# Patient Record
Sex: Female | Born: 1935
Health system: Southern US, Community
[De-identification: ages and names within clinical notes are randomized; demographics above are authoritative.]

## PROBLEM LIST (undated history)

## (undated) ENCOUNTER — Ambulatory Visit (HOSPITAL_COMMUNITY): Admission: EM | Payer: Medicare Other | Source: Home / Self Care

## (undated) DIAGNOSIS — I10 Essential (primary) hypertension: Secondary | ICD-10-CM

## (undated) DIAGNOSIS — Z973 Presence of spectacles and contact lenses: Secondary | ICD-10-CM

## (undated) DIAGNOSIS — Z972 Presence of dental prosthetic device (complete) (partial): Secondary | ICD-10-CM

## (undated) DIAGNOSIS — N183 Chronic kidney disease, stage 3 unspecified: Secondary | ICD-10-CM

## (undated) DIAGNOSIS — Z974 Presence of external hearing-aid: Secondary | ICD-10-CM

## (undated) DIAGNOSIS — D649 Anemia, unspecified: Secondary | ICD-10-CM

## (undated) DIAGNOSIS — E119 Type 2 diabetes mellitus without complications: Secondary | ICD-10-CM

## (undated) DIAGNOSIS — J45909 Unspecified asthma, uncomplicated: Secondary | ICD-10-CM

## (undated) DIAGNOSIS — I5032 Chronic diastolic (congestive) heart failure: Secondary | ICD-10-CM

## (undated) DIAGNOSIS — I350 Nonrheumatic aortic (valve) stenosis: Secondary | ICD-10-CM

## (undated) DIAGNOSIS — G709 Myoneural disorder, unspecified: Secondary | ICD-10-CM

## (undated) DIAGNOSIS — E538 Deficiency of other specified B group vitamins: Secondary | ICD-10-CM

## (undated) DIAGNOSIS — I480 Paroxysmal atrial fibrillation: Secondary | ICD-10-CM

## (undated) DIAGNOSIS — M1990 Unspecified osteoarthritis, unspecified site: Secondary | ICD-10-CM

## (undated) DIAGNOSIS — M75101 Unspecified rotator cuff tear or rupture of right shoulder, not specified as traumatic: Secondary | ICD-10-CM

## (undated) HISTORY — DX: Nonrheumatic aortic (valve) stenosis: I35.0

## (undated) HISTORY — DX: Paroxysmal atrial fibrillation: I48.0

## (undated) HISTORY — PX: TONSILLECTOMY: SUR1361

## (undated) HISTORY — DX: Anemia, unspecified: D64.9

## (undated) HISTORY — DX: Type 2 diabetes mellitus without complications: E11.9

## (undated) HISTORY — DX: Chronic diastolic (congestive) heart failure: I50.32

## (undated) HISTORY — DX: Deficiency of other specified B group vitamins: E53.8

## (undated) HISTORY — DX: Chronic kidney disease, stage 3 unspecified: N18.30

## (undated) HISTORY — DX: Chronic kidney disease, stage 3 (moderate): N18.3

## (undated) HISTORY — DX: Unspecified asthma, uncomplicated: J45.909

## (undated) HISTORY — DX: Unspecified rotator cuff tear or rupture of right shoulder, not specified as traumatic: M75.101

---
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
## 1966-10-21 HISTORY — PX: VESICOVAGINAL FISTULA CLOSURE W/ TAH: SUR271

## 1981-10-21 HISTORY — PX: OTHER SURGICAL HISTORY: SHX169

## 1987-10-22 HISTORY — PX: TYMPANOSTOMY TUBE PLACEMENT: SHX32

## 1992-10-21 HISTORY — PX: FOOT NEUROMA SURGERY: SHX646

## 1996-10-21 HISTORY — PX: OTHER SURGICAL HISTORY: SHX169

## 1999-08-01 ENCOUNTER — Ambulatory Visit (HOSPITAL_COMMUNITY): Admission: RE | Admit: 1999-08-01 | Discharge: 1999-08-01 | Payer: Self-pay | Admitting: Endocrinology

## 1999-10-22 HISTORY — PX: OTHER SURGICAL HISTORY: SHX169

## 1999-11-21 ENCOUNTER — Encounter: Payer: Self-pay | Admitting: Endocrinology

## 1999-11-21 ENCOUNTER — Encounter: Admission: RE | Admit: 1999-11-21 | Discharge: 1999-11-21 | Payer: Self-pay | Admitting: Endocrinology

## 2000-02-12 ENCOUNTER — Encounter: Admission: RE | Admit: 2000-02-12 | Discharge: 2000-02-12 | Payer: Self-pay | Admitting: Gastroenterology

## 2000-02-12 ENCOUNTER — Encounter: Payer: Self-pay | Admitting: Gastroenterology

## 2000-02-19 ENCOUNTER — Ambulatory Visit (HOSPITAL_COMMUNITY): Admission: RE | Admit: 2000-02-19 | Discharge: 2000-02-19 | Payer: Self-pay | Admitting: Gastroenterology

## 2000-03-12 ENCOUNTER — Ambulatory Visit (HOSPITAL_COMMUNITY): Admission: RE | Admit: 2000-03-12 | Discharge: 2000-03-12 | Payer: Self-pay | Admitting: Gastroenterology

## 2000-10-21 HISTORY — PX: OTHER SURGICAL HISTORY: SHX169

## 2001-01-06 ENCOUNTER — Encounter: Payer: Self-pay | Admitting: Gastroenterology

## 2001-01-06 ENCOUNTER — Ambulatory Visit (HOSPITAL_COMMUNITY): Admission: RE | Admit: 2001-01-06 | Discharge: 2001-01-06 | Payer: Self-pay | Admitting: Gastroenterology

## 2001-01-21 ENCOUNTER — Ambulatory Visit (HOSPITAL_COMMUNITY): Admission: RE | Admit: 2001-01-21 | Discharge: 2001-01-21 | Payer: Self-pay | Admitting: Gastroenterology

## 2001-01-21 ENCOUNTER — Encounter: Payer: Self-pay | Admitting: Gastroenterology

## 2001-05-28 ENCOUNTER — Encounter: Admission: RE | Admit: 2001-05-28 | Discharge: 2001-06-29 | Payer: Self-pay | Admitting: Orthopaedic Surgery

## 2003-04-14 ENCOUNTER — Encounter: Payer: Self-pay | Admitting: Orthopaedic Surgery

## 2003-04-19 ENCOUNTER — Inpatient Hospital Stay (HOSPITAL_COMMUNITY): Admission: RE | Admit: 2003-04-19 | Discharge: 2003-04-21 | Payer: Self-pay | Admitting: Orthopaedic Surgery

## 2003-04-21 ENCOUNTER — Encounter: Payer: Self-pay | Admitting: Physical Medicine & Rehabilitation

## 2003-04-21 ENCOUNTER — Inpatient Hospital Stay (HOSPITAL_COMMUNITY)
Admission: RE | Admit: 2003-04-21 | Discharge: 2003-04-28 | Payer: Self-pay | Admitting: Physical Medicine & Rehabilitation

## 2003-06-15 ENCOUNTER — Ambulatory Visit (HOSPITAL_COMMUNITY): Admission: RE | Admit: 2003-06-15 | Discharge: 2003-06-15 | Payer: Self-pay | Admitting: Orthopaedic Surgery

## 2003-10-22 HISTORY — PX: OTHER SURGICAL HISTORY: SHX169

## 2004-08-10 ENCOUNTER — Inpatient Hospital Stay (HOSPITAL_COMMUNITY): Admission: RE | Admit: 2004-08-10 | Discharge: 2004-08-13 | Payer: Self-pay | Admitting: Neurosurgery

## 2004-10-21 HISTORY — PX: OTHER SURGICAL HISTORY: SHX169

## 2004-12-18 ENCOUNTER — Encounter: Admission: RE | Admit: 2004-12-18 | Discharge: 2004-12-18 | Payer: Self-pay | Admitting: Neurosurgery

## 2005-01-07 ENCOUNTER — Encounter: Admission: RE | Admit: 2005-01-07 | Discharge: 2005-01-07 | Payer: Self-pay | Admitting: Neurosurgery

## 2005-05-22 ENCOUNTER — Encounter: Admission: RE | Admit: 2005-05-22 | Discharge: 2005-05-22 | Payer: Self-pay | Admitting: Orthopaedic Surgery

## 2005-05-31 ENCOUNTER — Encounter: Admission: RE | Admit: 2005-05-31 | Discharge: 2005-05-31 | Payer: Self-pay | Admitting: Orthopaedic Surgery

## 2006-02-14 ENCOUNTER — Emergency Department (HOSPITAL_COMMUNITY): Admission: EM | Admit: 2006-02-14 | Discharge: 2006-02-15 | Payer: Self-pay | Admitting: Emergency Medicine

## 2006-10-21 HISTORY — PX: CATARACT EXTRACTION, BILATERAL: SHX1313

## 2009-10-21 HISTORY — PX: OTHER SURGICAL HISTORY: SHX169

## 2010-11-11 ENCOUNTER — Encounter: Payer: Self-pay | Admitting: Neurosurgery

## 2010-11-11 ENCOUNTER — Encounter: Payer: Self-pay | Admitting: Neurology

## 2011-03-19 ENCOUNTER — Ambulatory Visit
Admission: RE | Admit: 2011-03-19 | Discharge: 2011-03-19 | Disposition: A | Discharge status: Home / Self Care | Payer: Medicare Other | Source: Ambulatory Visit | Attending: Family Medicine | Admitting: Family Medicine

## 2011-03-19 ENCOUNTER — Other Ambulatory Visit: Payer: Self-pay | Admitting: Family Medicine

## 2011-03-19 DIAGNOSIS — R05 Cough: Secondary | ICD-10-CM

## 2011-03-19 DIAGNOSIS — R058 Other specified cough: Secondary | ICD-10-CM

## 2011-07-10 ENCOUNTER — Encounter: Payer: Self-pay | Admitting: Emergency Medicine

## 2011-07-11 ENCOUNTER — Encounter: Payer: Self-pay | Admitting: Emergency Medicine

## 2011-07-11 ENCOUNTER — Ambulatory Visit (INDEPENDENT_AMBULATORY_CARE_PROVIDER_SITE_OTHER): Payer: Medicare Other | Admitting: Emergency Medicine

## 2011-07-11 ENCOUNTER — Ambulatory Visit (INDEPENDENT_AMBULATORY_CARE_PROVIDER_SITE_OTHER)
Admission: RE | Admit: 2011-07-11 | Discharge: 2011-07-11 | Disposition: A | Discharge status: Home / Self Care | Payer: Medicare Other | Source: Ambulatory Visit | Attending: Emergency Medicine | Admitting: Emergency Medicine

## 2011-07-11 DIAGNOSIS — E119 Type 2 diabetes mellitus without complications: Secondary | ICD-10-CM

## 2011-07-11 DIAGNOSIS — J45909 Unspecified asthma, uncomplicated: Secondary | ICD-10-CM | POA: Insufficient documentation

## 2011-07-11 DIAGNOSIS — R05 Cough: Secondary | ICD-10-CM

## 2011-07-11 DIAGNOSIS — R059 Cough, unspecified: Secondary | ICD-10-CM | POA: Insufficient documentation

## 2011-07-11 DIAGNOSIS — M1990 Unspecified osteoarthritis, unspecified site: Secondary | ICD-10-CM | POA: Insufficient documentation

## 2011-07-11 DIAGNOSIS — E559 Vitamin D deficiency, unspecified: Secondary | ICD-10-CM | POA: Insufficient documentation

## 2011-07-11 MED ORDER — OMEPRAZOLE 20 MG PO CPDR: 20.0000 mg | DELAYED_RELEASE_CAPSULE | Freq: Every day | ORAL | Status: DC

## 2011-07-11 MED ORDER — LORATADINE 10 MG PO TABS: 10.0000 mg | ORAL_TABLET | Freq: Every day | ORAL | Status: DC

## 2011-07-11 NOTE — Assessment & Plan Note (Signed)
-   stop Advair - SABA prn

## 2011-07-11 NOTE — Assessment & Plan Note (Signed)
I believe that cough is primarily UA irritation. Her asthma does not appear to be active on exam, she hasn;t responded to typical therapy.  - d/c advair - nasonex - start loiratadine and omeprazole empirically - ? Need to go back to Dr Earlean Shawl in the future  - rov 6 week

## 2011-07-11 NOTE — Patient Instructions (Signed)
Please stop your Advair Start taking your nasonex 2 sprays each side daily Start loratadine 10mg  daily Start omeprazole 20mg  daily We will perform a CXR today We will perform pulmonary function testing at a future visit.  Follow with Dr Lamonte Sakai in 4 -6 weeks to review your status

## 2011-07-11 NOTE — Progress Notes (Signed)
Subjective:    Patient ID: Ann Sellers, female    DOB: 1925-02-01, 77 y.o.   MRN: [id number]  HPI 77 yo former smoker (remote, 10pk-yrs), hx asthma dx as a child, also DM, gout, B12 def. Has esophageal strictures s/p dilation by Dr Earlean Shawl. Referred by Dr Chapman Fitch.    Tells me that she was on inhaled meds as a child, grew out of the sx, and then had recurrence non-productive cough, chest heaviness as an adult. This has been problematic off/on, but more consistent and bothersome in the last few yrs - chest tight and dry cough. In May had URI that increased the sx and caused colored mucous. Has been rx with several rounds steroids and abx since. She is left with severe persistent dry cough. She describes some dysphagia (has had dilation before) without overt aspiration sx. Some intermittent GERD, not on PPI. She uses nasonex prn, has some nasal gtt. She was started on Advair HFA 3 weeks ago, her cough is worse after she takes, has trouble w cough after.    Review of Systems  Constitutional: Negative.  Negative for fever and unexpected weight change.  HENT: Positive for trouble swallowing. Negative for ear pain, nosebleeds, congestion, sore throat, rhinorrhea, sneezing, dental problem, postnasal drip and sinus pressure.   Eyes: Negative.  Negative for redness and itching.  Respiratory: Positive for cough and shortness of breath. Negative for chest tightness and wheezing.   Cardiovascular: Negative.  Negative for palpitations and leg swelling.  Gastrointestinal: Negative.  Negative for nausea and vomiting.  Genitourinary: Negative.  Negative for dysuria.  Musculoskeletal: Positive for joint swelling.  Skin: Negative.  Negative for rash.  Neurological: Negative.  Negative for headaches.  Hematological: Negative.  Does not bruise/bleed easily.  Psychiatric/Behavioral: Negative.  Negative for dysphoric mood. The patient is not nervous/anxious.     Past Medical History  Diagnosis Date  . B12  deficiency   . Asthma   . Diabetes mellitus      Family History  Problem Relation Age of Onset  . Pneumonia Mother   . Heart disease Sister   . Diabetes Sister   . Hypertension Sister      History   Social History  . Marital Status: Widowed    Spouse Name: N/A    Number of Children: 6  . Years of Education: N/A   Occupational History  . homemaker    Social History Main Topics  . Smoking status: Former Smoker -- 0.5 packs/day for 20 years    Types: Cigarettes    Quit date: 10/21/1978  . Smokeless tobacco: Not on file  . Alcohol Use: Yes     social drinker  . Drug Use: No  . Sexually Active: Not on file   Other Topics Concern  . Not on file   Social History Narrative  . No narrative on file     Allergies  Allergen Reactions  . Snake Antivenin (Antivenin Crotalidae Polyvalent (Snake Antivenin))      Outpatient Prescriptions Prior to Visit  Medication Sig Dispense Refill  . albuterol (PROVENTIL HFA;VENTOLIN HFA) 108 (90 BASE) MCG/ACT inhaler Inhale 2 puffs into the lungs every 6 (six) hours as needed.        Marland Kitchen allopurinol (ZYLOPRIM) 100 MG tablet Take 100 mg by mouth daily.        Marland Kitchen aspirin 81 MG tablet Take 81 mg by mouth daily.        . Calcium Carbonate-Vitamin D (CALCIUM PLUS VITAMIN D PO)  Take 1 tablet by mouth daily.        . colchicine 0.6 MG tablet Take 0.6 mg by mouth daily as needed.       . fluticasone-salmeterol (ADVAIR HFA) 115-21 MCG/ACT inhaler Inhale 2 puffs into the lungs 2 (two) times daily.        . insulin glargine (LANTUS) 100 UNIT/ML injection Inject 20 Units into the skin at bedtime.        . meloxicam (MOBIC) 7.5 MG tablet Take 7.5 mg by mouth daily.        . mometasone (NASONEX) 50 MCG/ACT nasal spray Place 1 spray into the nose daily.        . Multiple Vitamins-Minerals (WOMENS DAILY FORMULA PO) Take 1 tablet by mouth daily.        . sitaGLIPtin (JANUVIA) 100 MG tablet Take 100 mg by mouth daily.        . vitamin B-12 (CYANOCOBALAMIN)  1000 MCG tablet Take 1,000 mcg by mouth daily.        Marland Kitchen albuterol (PROVENTIL) (2.5 MG/3ML) 0.083% nebulizer solution Take 2.5 mg by nebulization 3 (three) times daily as needed.              Objective:   Physical Exam  Gen: Pleasant, overwt elderly, in no distress,  normal affect  ENT: dentures, no erythema  Neck: No JVD, no TMG, no carotid bruits  Lungs: No wheezing, some referred UA noise w cough  Cardiovascular: RRR, heart sounds normal, no murmur or gallops, no peripheral edema  Musculoskeletal: No deformities, no cyanosis or clubbing  Neuro: alert, non focal  Skin: Warm, no lesions or rashes     Assessment & Plan:  Cough I believe that cough is primarily UA irritation. Her asthma does not appear to be active on exam, she hasn;t responded to typical therapy.  - d/c advair - nasonex - start loiratadine and omeprazole empirically - ? Need to go back to Dr Earlean Shawl in the future  - rov 6 week  Asthma - stop Advair - SABA prn

## 2011-08-13 ENCOUNTER — Encounter: Payer: Self-pay | Admitting: Adult Health

## 2011-08-13 ENCOUNTER — Ambulatory Visit (INDEPENDENT_AMBULATORY_CARE_PROVIDER_SITE_OTHER): Payer: Medicare Other | Admitting: Adult Health

## 2011-08-13 VITALS — BP 142/60 | HR 93 | Temp 97.1°F | Ht 61.0 in | Wt 200.0 lb

## 2011-08-13 DIAGNOSIS — R05 Cough: Secondary | ICD-10-CM

## 2011-08-13 DIAGNOSIS — R059 Cough, unspecified: Secondary | ICD-10-CM

## 2011-08-13 MED ORDER — HYDROCODONE-HOMATROPINE 5-1.5 MG/5ML PO SYRP: 5.0000 mL | ORAL_SOLUTION | Freq: Four times a day (QID) | ORAL | Status: AC | PRN

## 2011-08-13 MED ORDER — PREDNISONE 10 MG PO TABS: ORAL_TABLET | ORAL | Status: DC

## 2011-08-13 NOTE — Patient Instructions (Addendum)
Add Delsym 2 tsp Twice daily   Add Chlortrimeton 4mg  2 At bedtime   Prilosec 20mg  daily before meal Add Pepcid 20mg  At bedtime   Use sugarless candy, water to prevent cough and throat clearing.  AVOID ALL MINTS Hydromet 1 tsp every 4-6 hr As needed  Cough ,may make you sleepy.  Prednisone taper over next week.  follow up DR. Byrum in 1 week as planned and As needed

## 2011-08-15 NOTE — Assessment & Plan Note (Addendum)
Upper airway cough syndrome w/ neg cxr on 07/11/11 tx for possible triggers of rhinitis and gerd along w/ cough control   Plan:   Add Delsym 2 tsp Twice daily   Add Chlortrimeton 4mg  2 At bedtime   Prilosec 20mg  daily before meal Add Pepcid 20mg  At bedtime   Use sugarless candy, water to prevent cough and throat clearing.  AVOID ALL MINTS Hydromet 1 tsp every 4-6 hr As needed  Cough ,may make you sleepy.  Prednisone taper over next week.

## 2011-08-15 NOTE — Progress Notes (Signed)
  Subjective:    Patient ID: Ann Sellers, female    DOB: [birth year], 77 y.o.   MRN: [id number]  HPI 77 yo former smoker (remote, 10pk-yrs), hx asthma dx as a child, also DM, gout, B12 def. Has esophageal strictures s/p dilation by Dr Earlean Shawl. Referred by Dr Chapman Fitch.   07/11/11 Initial pulmonary consult  Tells me that she was on inhaled meds as a child, grew out of the sx, and then had recurrence non-productive cough, chest heaviness as an adult. This has been problematic off/on, but more consistent and bothersome in the last few yrs - chest tight and dry cough. In May had URI that increased the sx and caused colored mucous. Has been rx with several rounds steroids and abx since. She is left with severe persistent dry cough. She describes some dysphagia (has had dilation before) without overt aspiration sx. Some intermittent GERD, not on PPI. She uses nasonex prn, has some nasal gtt. She was started on Advair HFA 3 weeks ago, her cough is worse after she takes, has trouble w cough after.  >>d/c advair   08/13/2011 Acute OV  Returns for persistent cough , does not feel cough is getting better. Cough is dry in nature.  Cough worse - Mostly dry - SOB worse on exertion over past 4-6 weeks. Tx for upper airway cough syndome last ov with claritin, nasonex and prilosec. CXR did not show any acute process on 07/11/11.  She was taken off advair. No discolored mucus or fever. Does have drainage and tickle in throat.       Review of Systems Constitutional:   No  weight loss, night sweats,  Fevers, chills, fatigue, or  lassitude.  HEENT:   No headaches,  Difficulty swallowing,  Tooth/dental problems, or  Sore throat,                No sneezing, itching, ear ache, nasal congestion,  +post nasal drip,   CV:  No chest pain,  Orthopnea, PND, swelling in lower extremities, anasarca, dizziness, palpitations, syncope.   GI  No heartburn, indigestion, abdominal pain, nausea, vomiting, diarrhea, change in bowel  habits, loss of appetite, bloody stools.    Lungs:   No chest wall deformity  Skin: no rash or lesions.  GU: no dysuria, change in color of urine, no urgency or frequency.  No flank pain, no hematuria   MS:  No joint pain or swelling.  No decreased range of motion.     Psych:  No change in mood or affect. No depression or anxiety.           Objective:   Physical Exam GEN: A/Ox3; pleasant , NAD  HEENT:  [place]/AT,  EACs-clear, TMs-wnl, NOSE-clear, THROAT-clear, no lesions, no postnasal drip or exudate noted.   NECK:  Supple w/ fair ROM; no JVD; normal carotid impulses w/o bruits; no thyromegaly or nodules palpated; no lymphadenopathy.  RESP  Coarse BS w/o, wheezes/ rales/ or rhonchi.no accessory muscle use, no dullness to percussion  CARD:  RRR, no m/r/g  , no peripheral edema, pulses intact, no cyanosis or clubbing.  GI:   Soft & nt; nml bowel sounds; no organomegaly or masses detected.  Musco: Warm bil, no deformities or joint swelling noted.   Neuro: alert, no focal deficits noted.    Skin: Warm, no lesions or rashes         Assessment & Plan:

## 2011-08-20 ENCOUNTER — Ambulatory Visit (INDEPENDENT_AMBULATORY_CARE_PROVIDER_SITE_OTHER): Payer: Medicare Other | Admitting: Emergency Medicine

## 2011-08-20 ENCOUNTER — Encounter: Payer: Self-pay | Admitting: Emergency Medicine

## 2011-08-20 DIAGNOSIS — J45909 Unspecified asthma, uncomplicated: Secondary | ICD-10-CM

## 2011-08-20 DIAGNOSIS — R059 Cough, unspecified: Secondary | ICD-10-CM

## 2011-08-20 DIAGNOSIS — R05 Cough: Secondary | ICD-10-CM

## 2011-08-20 NOTE — Progress Notes (Signed)
  Subjective:    Patient ID: Ann Sellers, female    DOB: 01-28-1925, 77 y.o.   MRN: [id number]  HPI 77 yo former smoker (remote, 10pk-yrs), hx asthma dx as a child, also DM, gout, B12 def. Has esophageal strictures s/p dilation by Dr Earlean Shawl. Referred by Dr Chapman Fitch.   07/11/11 Initial pulmonary consult  Tells me that she was on inhaled meds as a child, grew out of the sx, and then had recurrence non-productive cough, chest heaviness as an adult. This has been problematic off/on, but more consistent and bothersome in the last few yrs - chest tight and dry cough. In May had URI that increased the sx and caused colored mucous. Has been rx with several rounds steroids and abx since. She is left with severe persistent dry cough. She describes some dysphagia (has had dilation before) without overt aspiration sx. Some intermittent GERD, not on PPI. She uses nasonex prn, has some nasal gtt. She was started on Advair HFA 3 weeks ago, her cough is worse after she takes, has trouble w cough after.  >>d/c advair   08/13/2011 Acute OV  Returns for persistent cough , does not feel cough is getting better. Cough is dry in nature.  Cough worse - Mostly dry - SOB worse on exertion over past 4-6 weeks. Tx for upper airway cough syndome last ov with claritin, nasonex and prilosec. CXR did not show any acute process on 07/11/11.  She was taken off advair. No discolored mucus or fever. Does have drainage and tickle in throat.   ROV 08/20/11 - UA irritation, cough. She saw TP last week and was treated with pred taper; also given delsym, chlorphenerimine. Also added pepcid to prilosec. Also taking delsym prn. He cough is better. Hasn't missed the Advair w regard to breathing.      Objective:   Physical Exam GEN: A/Ox3; pleasant , NAD  HEENT:  [place]/AT,  EACs-clear, TMs-wnl, NOSE-clear, THROAT-clear, no lesions, no postnasal drip or exudate noted.   NECK:  Supple w/ fair ROM; no JVD; normal carotid impulses w/o bruits; no  thyromegaly or nodules palpated; no lymphadenopathy.  RESP  Coarse BS w/o, wheezes/ rales/ or rhonchi.no accessory muscle use, no dullness to percussion  CARD:  RRR, no m/r/g  , no peripheral edema, pulses intact, no cyanosis or clubbing.  GI:   Soft & nt; nml bowel sounds; no organomegaly or masses detected.  Musco: Warm bil, no deformities or joint swelling noted.   Neuro: alert, no focal deficits noted.    Skin: Warm, no lesions or rashes     Assessment & Plan:  Asthma No scheduled meds at this time  Cough Do not restart Advair Try stopping delsym. You may still need to use occasionally for cough.  Continue the Chlortrimaton  Continue the nasonex daily Continue the prilosec and pepcid.  Follow up with Dr Lamonte Sakai in 1 month.

## 2011-08-20 NOTE — Assessment & Plan Note (Signed)
Do not restart Advair Try stopping delsym. You may still need to use occasionally for cough.  Continue the Chlortrimaton  Continue the nasonex daily Continue the prilosec and pepcid.  Follow up with Dr Lamonte Sakai in 1 month.

## 2011-08-20 NOTE — Assessment & Plan Note (Signed)
No scheduled meds at this time

## 2011-08-20 NOTE — Patient Instructions (Signed)
Do not restart Advair Try stopping delsym. You may still need to use occasionally for cough.  Continue the Chlortrimaton  Continue the nasonex daily Continue the prilosec and pepcid.  Follow up with Dr Lamonte Sakai in 1 month.

## 2011-09-23 ENCOUNTER — Encounter: Payer: Self-pay | Admitting: Emergency Medicine

## 2011-09-23 ENCOUNTER — Ambulatory Visit (INDEPENDENT_AMBULATORY_CARE_PROVIDER_SITE_OTHER): Payer: Medicare Other | Admitting: Emergency Medicine

## 2011-09-23 DIAGNOSIS — R059 Cough, unspecified: Secondary | ICD-10-CM

## 2011-09-23 DIAGNOSIS — J45909 Unspecified asthma, uncomplicated: Secondary | ICD-10-CM

## 2011-09-23 DIAGNOSIS — R05 Cough: Secondary | ICD-10-CM

## 2011-09-23 MED ORDER — LORATADINE 10 MG PO TABS: 10.0000 mg | ORAL_TABLET | Freq: Every day | ORAL | Status: DC

## 2011-09-23 NOTE — Patient Instructions (Signed)
Please go back on your Advair 2 puffs twice a day Use albuterol as needed Continue your prilosec and pepcid every day Use chlortrimaton at night Start loratadine 10mg  daily (Claritin) Use Nasonex 2 sprays each side every day Follow with Dr Lamonte Sakai in 3 months or sooner if you have any problems.

## 2011-09-23 NOTE — Assessment & Plan Note (Addendum)
-   albuterol prn - consider restart Advair vs symbicort

## 2011-09-23 NOTE — Progress Notes (Signed)
  Subjective:    Patient ID: Ann Sellers, female    DOB: 08-16-1925, 77 y.o.   MRN: [id number] HPI 77 yo former smoker (remote, 10pk-yrs), hx asthma dx as a child, also DM, gout, B12 def. Has esophageal strictures s/p dilation by Dr Earlean Shawl. Referred by Dr Chapman Fitch.   07/11/11 Initial pulmonary consult  Tells me that she was on inhaled meds as a child, grew out of the sx, and then had recurrence non-productive cough, chest heaviness as an adult. This has been problematic off/on, but more consistent and bothersome in the last few yrs - chest tight and dry cough. In May had URI that increased the sx and caused colored mucous. Has been rx with several rounds steroids and abx since. She is left with severe persistent dry cough. She describes some dysphagia (has had dilation before) without overt aspiration sx. Some intermittent GERD, not on PPI. She uses nasonex prn, has some nasal gtt. She was started on Advair HFA 3 weeks ago, her cough is worse after she takes, has trouble w cough after.  >>d/c advair   08/13/2011 Acute OV  Returns for persistent cough , does not feel cough is getting better. Cough is dry in nature.  Cough worse - Mostly dry - SOB worse on exertion over past 4-6 weeks. Tx for upper airway cough syndome last ov with claritin, nasonex and prilosec. CXR did not show any acute process on 07/11/11.  She was taken off advair. No discolored mucus or fever. Does have drainage and tickle in throat.   ROV 08/20/11 - UA irritation, cough. She saw TP last week and was treated with pred taper; also given delsym, chlorphenerimine. Also added pepcid to prilosec. Also taking delsym prn. He cough is better. Hasn't missed the Advair w regard to breathing.   ROV 09/23/11 -- UA irritation, cough. Hx childhood asthma. She has cut down on delsym use, remains on her prilosec + pepcid, chlortrimaton at night. No longer on Advair. She continues to have dry cough. She has exertional dyspnea with walking and  housework.      Objective:   Physical Exam GEN: A/Ox3; pleasant , NAD  HEENT:  [place]/AT,  EACs-clear, TMs-wnl, NOSE-clear, THROAT-clear, no lesions, no postnasal drip or exudate noted.   NECK:  Supple w/ fair ROM; no JVD; normal carotid impulses w/o bruits; no thyromegaly or nodules palpated; no lymphadenopathy.  RESP  Coarse BS w/o, wheezes/ rales/ or rhonchi.no accessory muscle use, no dullness to percussion  CARD:  RRR, no m/r/g  , no peripheral edema, pulses intact, no cyanosis or clubbing.  Musco: Warm bil, no deformities or joint swelling noted.   Neuro: alert, no focal deficits noted.    Skin: Warm, no lesions or rashes     Assessment & Plan:  Asthma - albuterol prn - consider restart Advair vs symbicort  Cough - continue prilosec and pepcid.  - nasonex - chlortrimaton qhs - add loratadine

## 2011-09-23 NOTE — Assessment & Plan Note (Signed)
-   continue prilosec and pepcid.  - nasonex - chlortrimaton qhs - add loratadine

## 2011-10-18 ENCOUNTER — Encounter (HOSPITAL_COMMUNITY): Payer: Self-pay | Admitting: Emergency Medicine

## 2011-10-18 ENCOUNTER — Other Ambulatory Visit: Payer: Self-pay

## 2011-10-18 ENCOUNTER — Emergency Department (HOSPITAL_COMMUNITY)
Admission: EM | Admit: 2011-10-18 | Discharge: 2011-10-18 | Disposition: A | Discharge status: Home / Self Care | Payer: Medicare Other | Attending: Emergency Medicine | Admitting: Emergency Medicine

## 2011-10-18 DIAGNOSIS — E538 Deficiency of other specified B group vitamins: Secondary | ICD-10-CM | POA: Insufficient documentation

## 2011-10-18 DIAGNOSIS — R059 Cough, unspecified: Secondary | ICD-10-CM | POA: Insufficient documentation

## 2011-10-18 DIAGNOSIS — Z79899 Other long term (current) drug therapy: Secondary | ICD-10-CM | POA: Insufficient documentation

## 2011-10-18 DIAGNOSIS — S51809A Unspecified open wound of unspecified forearm, initial encounter: Secondary | ICD-10-CM | POA: Insufficient documentation

## 2011-10-18 DIAGNOSIS — W1809XA Striking against other object with subsequent fall, initial encounter: Secondary | ICD-10-CM | POA: Insufficient documentation

## 2011-10-18 DIAGNOSIS — E119 Type 2 diabetes mellitus without complications: Secondary | ICD-10-CM | POA: Insufficient documentation

## 2011-10-18 DIAGNOSIS — W19XXXA Unspecified fall, initial encounter: Secondary | ICD-10-CM

## 2011-10-18 DIAGNOSIS — S51811A Laceration without foreign body of right forearm, initial encounter: Secondary | ICD-10-CM

## 2011-10-18 DIAGNOSIS — Y92009 Unspecified place in unspecified non-institutional (private) residence as the place of occurrence of the external cause: Secondary | ICD-10-CM | POA: Insufficient documentation

## 2011-10-18 DIAGNOSIS — J45909 Unspecified asthma, uncomplicated: Secondary | ICD-10-CM | POA: Insufficient documentation

## 2011-10-18 DIAGNOSIS — Z794 Long term (current) use of insulin: Secondary | ICD-10-CM | POA: Insufficient documentation

## 2011-10-18 DIAGNOSIS — R05 Cough: Secondary | ICD-10-CM | POA: Insufficient documentation

## 2011-10-18 LAB — GLUCOSE, CAPILLARY: Glucose-Capillary: 92 mg/dL (ref 70–99)

## 2011-10-18 MED ORDER — TETANUS-DIPHTH-ACELL PERTUSSIS 5-2.5-18.5 LF-MCG/0.5 IM SUSP
0.5000 mL | Freq: Once | INTRAMUSCULAR | Status: AC
  Administered 2011-10-18: 0.5 mL via INTRAMUSCULAR
  Filled 2011-10-18: qty 0.5

## 2011-10-18 NOTE — ED Notes (Signed)
CBG 92 mg/dl tested by glucometer in the ED

## 2011-10-18 NOTE — ED Provider Notes (Signed)
History     CSN: [id number]  Arrival date & time 10/18/11  1616   First MD Initiated Contact with Patient 10/18/11 1629      Chief Complaint  Patient presents with  . Fall    (Consider location/radiation/quality/duration/timing/severity/associated sxs/prior treatment) The history is provided by the patient and a relative.   Patient is an 77 year old female who presents with right forearm injury after fall. Patient was walking in her house. She tripped on a rug and the carpet and fell against the vanity. She hit her right forearm on the edge of the vanity. She then fell to the ground. She did not hit her head. She denies head pain, neck pain, back pain. She was able to stand after this and did not have any lower extremity or hip pain. She denies any preceding chest pain dizziness or dyspnea. She is had a chronic cough for the last 6 months but notes no change in that. She was noted to be hypertensive with EMS, however she has not noticed any symptoms related to that. She has also been taking over-the-counter cough congestion medicines in the past few days. In regards to right forearm injury, patient has deep skin tear over the right forearm. She denies any foreign body. She is able to use and move her right hand without any difficulty. Overall severity is described as mild. Patient unsure of last tetanus shot. No modifying factors noted.  Past Medical History  Diagnosis Date  . B12 deficiency   . Asthma   . Diabetes mellitus     Past Surgical History  Procedure Date  . Tonsillectomy 1936  . Appendectomy 1945  . Vesicovaginal fistula closure w/ tah 1968  . Herniated disc 1983  . Tympanostomy tube placement 1989  . Foot neuroma surgery 1994  . Left knee surgery 1998  . Esophageal stretch 2001  . Left knee replacement 2002  . Cervical stynosis 2005  . Right wrist fracture 2006  . Cataract extraction, bilateral 2008  . Left hand surgery 2011    gout    Family History  Problem  Relation Age of Onset  . Pneumonia Mother   . Heart disease Sister   . Diabetes Sister   . Hypertension Sister     History  Substance Use Topics  . Smoking status: Former Smoker -- 0.5 packs/day for 20 years    Types: Cigarettes    Quit date: 10/21/1978  . Smokeless tobacco: Not on file  . Alcohol Use: Yes     social drinker    OB History    Grav Para Term Preterm Abortions TAB SAB Ect Mult Living                  Review of Systems  Constitutional: Negative for fever and chills.  HENT: Negative for facial swelling.   Eyes: Negative for visual disturbance.  Respiratory: Positive for cough (dry, chronic, no change). Negative for chest tightness, shortness of breath and wheezing.   Cardiovascular: Negative for chest pain.  Gastrointestinal: Negative for nausea, vomiting, abdominal pain and diarrhea.  Genitourinary: Negative for difficulty urinating.  Skin: Negative for rash.  Neurological: Negative for weakness and numbness.  Psychiatric/Behavioral: Negative for behavioral problems and confusion.  All other systems reviewed and are negative.    Allergies  Snake antivenin  Home Medications   Current Outpatient Rx  Name Route Sig Dispense Refill  . ALBUTEROL SULFATE HFA 108 (90 BASE) MCG/ACT IN AERS Inhalation Inhale 2 puffs into the lungs  every 6 (six) hours as needed.      . ALBUTEROL SULFATE (2.5 MG/3ML) 0.083% IN NEBU Nebulization Take 2.5 mg by nebulization 3 (three) times daily as needed.      . ALLOPURINOL 100 MG PO TABS Oral Take 100 mg by mouth daily.      . ASPIRIN 81 MG PO TABS Oral Take 81 mg by mouth daily.      . BD INSULIN SYRINGE MICROFINE 28G X 1/2" 1 ML MISC  Use as directed    . CALCIUM PLUS VITAMIN D PO Oral Take 1 tablet by mouth daily.      . CHLORPHENIRAMINE MALEATE 4 MG PO TABS  2 by mouth at bedtime     . COLCHICINE 0.6 MG PO TABS Oral Take 0.6 mg by mouth daily as needed.     . CYANOCOBALAMIN 1000 MCG/ML IJ SOLN Intramuscular Inject 1,000 mcg  into the muscle every 30 (thirty) days.      Marland Kitchen DEXTROMETHORPHAN POLISTIREX ER 30 MG/5ML PO LQCR  2 tsp twice daily     . FAMOTIDINE 20 MG PO TABS Oral Take 20 mg by mouth at bedtime.      . INSULIN GLARGINE 100 UNIT/ML [place] SOLN Subcutaneous Inject 20 Units into the skin at bedtime.      Marland Kitchen LORATADINE 10 MG PO TABS Oral Take 1 tablet (10 mg total) by mouth daily. 30 tablet 11  . MELOXICAM 7.5 MG PO TABS Oral Take 7.5 mg by mouth daily.      . MOMETASONE FUROATE 50 MCG/ACT NA SUSP Nasal Place 2 sprays into the nose daily.     . WOMENS DAILY FORMULA PO Oral Take 1 tablet by mouth daily.      Marland Kitchen OMEPRAZOLE 20 MG PO CPDR Oral Take 1 capsule (20 mg total) by mouth daily. 30 capsule 5  . SITAGLIPTIN PHOSPHATE 100 MG PO TABS Oral Take 100 mg by mouth daily.        BP 166/76  Pulse 102  Temp(Src) 97.7 F (36.5 C) (Oral)  Resp 28  SpO2 97%  Physical Exam  Nursing note and vitals reviewed. Constitutional: She is oriented to person, place, and time. She appears well-developed and well-nourished. No distress.  HENT:  Head: Normocephalic and atraumatic.  Mouth/Throat: Oropharynx is clear and moist.  Eyes: EOM are normal. Pupils are equal, round, and reactive to light.  Neck: Normal range of motion. Neck supple. No tracheal deviation present.       No midline C spine TTP No step offs No visible injury  Cardiovascular: Normal rate and regular rhythm.   Pulmonary/Chest: Effort normal and breath sounds normal. No respiratory distress. She exhibits no tenderness.  Abdominal: Soft. She exhibits no distension. There is no tenderness.       No visible injury  Musculoskeletal: Normal range of motion. She exhibits no edema and no tenderness.       No midline T or L spine TTP No step offs  Right forearm: Deep skin tear to the dorsum of the right forearm. No retained foreign body. No bony tenderness around the wound. Normal range of motion without pain of the elbow and wrist.  2+ distal pulse. Sensation and  motor function intact distally in all distributions.  Neurological: She is alert and oriented to person, place, and time. No cranial nerve deficit. Coordination normal.  Skin: Skin is warm and dry. She is not diaphoretic.  Psychiatric: She has a normal mood and affect. Her behavior is normal.  Thought content normal.    ED Course  Procedures (including critical care time)  LACERATION REPAIR Performed by: Fritz Pickerel Authorized by: Fritz Pickerel Consent: Verbal consent obtained. Risks and benefits: risks, benefits and alternatives were discussed Consent given by: patient Patient identity confirmed: provided demographic data Prepped and Draped in normal sterile fashion Wound explored  Laceration Location: R forearm  Laceration Length: 6 cm  No Foreign Bodies seen or palpated  Anesthesia: local infiltration  Local anesthetic: lidocaine 1% without epinephrine  Anesthetic total: 3 ml  Irrigation method: syringe Amount of cleaning: standard  Skin closure: primary  Number of sutures: 11  Technique: Simple interrupted sutures with 4-0 Prolene   Patient tolerance: Patient tolerated the procedure well with no immediate complications.    ECG on 10/16/2011 at 1643: Heart rate 92. Normal sinus rhythm with occasional PVC and occasional junctional complex. Q waves in V1 and V2. Nonspecific T wave change and anteriorly. Normal ST waves. Left axis deviation. Normal intervals. No hypertrophy. No acute ischemia. No old EKG for comparison   Labs Reviewed  GLUCOSE, CAPILLARY   No results found.   1. Fall   2. Laceration of right forearm       MDM   Patient here with forearm laceration after mechanical fall. There are no significant preceding symptoms the patient recalls tripping over a rug. She did not hit her head and does not have any other evidence of trauma on her exam. She also has no other complaints. She is able to stand and bear weight without pain. No acute  indication for any radiography. Laceration is superficial. No foreign bodies. No bony tenderness. Primarily closed. Small area of abrasion covered with Xeroform. Patient well-appearing in discharge. Return precautions discussed. Should his PCP appointment in 5-7 days and will keep that.       Fritz Pickerel 10/18/11 2338

## 2011-10-18 NOTE — ED Notes (Signed)
Patient tripped fell from bathroom and skin tear right forearm bandaged by EMS Bleeding controlled.  EMS reported irregular heartbeat PVC.  Patient denies chest pain or shortness of breath. Denies head trauma or LOC

## 2011-10-19 NOTE — ED Provider Notes (Signed)
I saw and evaluated the patient, reviewed the resident's note and I agree with the findings and plan.   .Face to face Exam:  General:  Awake HEENT:  Unremarkable Resp:  Normal effort Abd:  Nondistended Neuro:No focal weakness Lymph: No adenopathy   Dot Lanes, MD 10/19/11 1106

## 2011-12-28 ENCOUNTER — Other Ambulatory Visit: Payer: Self-pay | Admitting: Emergency Medicine

## 2012-12-22 ENCOUNTER — Encounter (HOSPITAL_BASED_OUTPATIENT_CLINIC_OR_DEPARTMENT_OTHER): Payer: Self-pay | Admitting: *Deleted

## 2012-12-22 NOTE — Progress Notes (Signed)
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

## 2012-12-23 ENCOUNTER — Ambulatory Visit (HOSPITAL_BASED_OUTPATIENT_CLINIC_OR_DEPARTMENT_OTHER): Admission: RE | Admit: 2012-12-23 | Payer: Medicare Other | Source: Ambulatory Visit | Admitting: Orthopedic Surgery

## 2012-12-23 HISTORY — DX: Presence of spectacles and contact lenses: Z97.3

## 2012-12-23 HISTORY — DX: Presence of dental prosthetic device (complete) (partial): Z97.2

## 2012-12-23 HISTORY — DX: Unspecified osteoarthritis, unspecified site: M19.90

## 2012-12-23 HISTORY — DX: Presence of external hearing-aid: Z97.4

## 2012-12-23 HISTORY — DX: Myoneural disorder, unspecified: G70.9

## 2012-12-23 HISTORY — DX: Essential (primary) hypertension: I10

## 2012-12-23 SURGERY — SHOULDER ARTHROSCOPY WITH SUBACROMIAL DECOMPRESSION AND BICEP TENDON REPAIR
Anesthesia: General | Laterality: Right

## 2013-01-05 ENCOUNTER — Encounter (HOSPITAL_BASED_OUTPATIENT_CLINIC_OR_DEPARTMENT_OTHER): Payer: Self-pay | Admitting: Anesthesiology

## 2013-01-05 ENCOUNTER — Ambulatory Visit (HOSPITAL_BASED_OUTPATIENT_CLINIC_OR_DEPARTMENT_OTHER): Payer: Medicare Other | Admitting: Anesthesiology

## 2013-01-05 ENCOUNTER — Encounter (HOSPITAL_BASED_OUTPATIENT_CLINIC_OR_DEPARTMENT_OTHER)
Admission: RE | Disposition: A | Discharge status: Home / Self Care | Payer: Self-pay | Source: Ambulatory Visit | Attending: Orthopedic Surgery

## 2013-01-05 ENCOUNTER — Encounter (HOSPITAL_BASED_OUTPATIENT_CLINIC_OR_DEPARTMENT_OTHER): Payer: Self-pay | Admitting: *Deleted

## 2013-01-05 ENCOUNTER — Encounter: Payer: Self-pay | Admitting: Physician Assistant

## 2013-01-05 ENCOUNTER — Other Ambulatory Visit: Payer: Self-pay | Admitting: Physician Assistant

## 2013-01-05 ENCOUNTER — Ambulatory Visit (HOSPITAL_BASED_OUTPATIENT_CLINIC_OR_DEPARTMENT_OTHER)
Admission: RE | Admit: 2013-01-05 | Discharge: 2013-01-06 | Disposition: A | Discharge status: Home / Self Care | Payer: Medicare Other | Source: Ambulatory Visit | Attending: Orthopedic Surgery | Admitting: Orthopedic Surgery

## 2013-01-05 DIAGNOSIS — E119 Type 2 diabetes mellitus without complications: Secondary | ICD-10-CM | POA: Insufficient documentation

## 2013-01-05 DIAGNOSIS — Z794 Long term (current) use of insulin: Secondary | ICD-10-CM | POA: Insufficient documentation

## 2013-01-05 DIAGNOSIS — Z973 Presence of spectacles and contact lenses: Secondary | ICD-10-CM | POA: Insufficient documentation

## 2013-01-05 DIAGNOSIS — Z981 Arthrodesis status: Secondary | ICD-10-CM | POA: Insufficient documentation

## 2013-01-05 DIAGNOSIS — I10 Essential (primary) hypertension: Secondary | ICD-10-CM | POA: Insufficient documentation

## 2013-01-05 DIAGNOSIS — G709 Myoneural disorder, unspecified: Secondary | ICD-10-CM

## 2013-01-05 DIAGNOSIS — M67919 Unspecified disorder of synovium and tendon, unspecified shoulder: Secondary | ICD-10-CM | POA: Insufficient documentation

## 2013-01-05 DIAGNOSIS — W19XXXA Unspecified fall, initial encounter: Secondary | ICD-10-CM | POA: Insufficient documentation

## 2013-01-05 DIAGNOSIS — Z888 Allergy status to other drugs, medicaments and biological substances status: Secondary | ICD-10-CM | POA: Insufficient documentation

## 2013-01-05 DIAGNOSIS — J4489 Other specified chronic obstructive pulmonary disease: Secondary | ICD-10-CM | POA: Insufficient documentation

## 2013-01-05 DIAGNOSIS — Z96659 Presence of unspecified artificial knee joint: Secondary | ICD-10-CM | POA: Insufficient documentation

## 2013-01-05 DIAGNOSIS — M25819 Other specified joint disorders, unspecified shoulder: Secondary | ICD-10-CM | POA: Insufficient documentation

## 2013-01-05 DIAGNOSIS — S43439A Superior glenoid labrum lesion of unspecified shoulder, initial encounter: Secondary | ICD-10-CM | POA: Insufficient documentation

## 2013-01-05 DIAGNOSIS — Z974 Presence of external hearing-aid: Secondary | ICD-10-CM | POA: Insufficient documentation

## 2013-01-05 DIAGNOSIS — J449 Chronic obstructive pulmonary disease, unspecified: Secondary | ICD-10-CM | POA: Insufficient documentation

## 2013-01-05 DIAGNOSIS — Z972 Presence of dental prosthetic device (complete) (partial): Secondary | ICD-10-CM | POA: Insufficient documentation

## 2013-01-05 DIAGNOSIS — S43499A Other sprain of unspecified shoulder joint, initial encounter: Secondary | ICD-10-CM | POA: Insufficient documentation

## 2013-01-05 DIAGNOSIS — E538 Deficiency of other specified B group vitamins: Secondary | ICD-10-CM | POA: Insufficient documentation

## 2013-01-05 DIAGNOSIS — Z87891 Personal history of nicotine dependence: Secondary | ICD-10-CM | POA: Insufficient documentation

## 2013-01-05 DIAGNOSIS — M719 Bursopathy, unspecified: Secondary | ICD-10-CM | POA: Insufficient documentation

## 2013-01-05 DIAGNOSIS — R011 Cardiac murmur, unspecified: Secondary | ICD-10-CM | POA: Insufficient documentation

## 2013-01-05 DIAGNOSIS — M4800 Spinal stenosis, site unspecified: Secondary | ICD-10-CM | POA: Insufficient documentation

## 2013-01-05 DIAGNOSIS — M1990 Unspecified osteoarthritis, unspecified site: Secondary | ICD-10-CM | POA: Insufficient documentation

## 2013-01-05 DIAGNOSIS — M19019 Primary osteoarthritis, unspecified shoulder: Secondary | ICD-10-CM | POA: Insufficient documentation

## 2013-01-05 DIAGNOSIS — M75101 Unspecified rotator cuff tear or rupture of right shoulder, not specified as traumatic: Secondary | ICD-10-CM | POA: Insufficient documentation

## 2013-01-05 DIAGNOSIS — S46819A Strain of other muscles, fascia and tendons at shoulder and upper arm level, unspecified arm, initial encounter: Secondary | ICD-10-CM | POA: Insufficient documentation

## 2013-01-05 DIAGNOSIS — S43429A Sprain of unspecified rotator cuff capsule, initial encounter: Secondary | ICD-10-CM | POA: Insufficient documentation

## 2013-01-05 HISTORY — PX: SHOULDER ARTHROSCOPY WITH ROTATOR CUFF REPAIR: SHX5685

## 2013-01-05 LAB — POCT I-STAT, CHEM 8
BUN: 37 mg/dL — ABNORMAL HIGH (ref 6–23)
Chloride: 107 mEq/L (ref 96–112)
Creatinine, Ser: 1.6 mg/dL — ABNORMAL HIGH (ref 0.50–1.10)
Glucose, Bld: 136 mg/dL — ABNORMAL HIGH (ref 70–99)
HCT: 39 % (ref 36.0–46.0)
Hemoglobin: 13.3 g/dL (ref 12.0–15.0)

## 2013-01-05 SURGERY — ARTHROSCOPY, SHOULDER, WITH ROTATOR CUFF REPAIR
Anesthesia: Regional | Site: Shoulder | Laterality: Right | Wound class: Clean

## 2013-01-05 MED ORDER — HYDROMORPHONE HCL PF 1 MG/ML IJ SOLN
0.2500 mg | INTRAMUSCULAR | Status: DC | PRN
  Administered 2013-01-05: 0.5 mg via INTRAVENOUS
  Administered 2013-01-05: 0.25 mg via INTRAVENOUS

## 2013-01-05 MED ORDER — ALBUTEROL SULFATE HFA 108 (90 BASE) MCG/ACT IN AERS: 2.0000 | INHALATION_SPRAY | Freq: Four times a day (QID) | RESPIRATORY_TRACT | Status: DC | PRN

## 2013-01-05 MED ORDER — CHLORHEXIDINE GLUCONATE 4 % EX LIQD: 60.0000 mL | Freq: Once | CUTANEOUS | Status: DC

## 2013-01-05 MED ORDER — CEFAZOLIN SODIUM-DEXTROSE 2-3 GM-% IV SOLR
2.0000 g | INTRAVENOUS | Status: AC
  Administered 2013-01-05: 2 g via INTRAVENOUS

## 2013-01-05 MED ORDER — SODIUM CHLORIDE 0.9 % IR SOLN
Status: DC | PRN
  Administered 2013-01-05: 12000 mL

## 2013-01-05 MED ORDER — PROPOFOL 10 MG/ML IV BOLUS
INTRAVENOUS | Status: DC | PRN
  Administered 2013-01-05: 120 mg via INTRAVENOUS

## 2013-01-05 MED ORDER — MIDAZOLAM HCL 2 MG/2ML IJ SOLN: 1.0000 mg | INTRAMUSCULAR | Status: DC | PRN

## 2013-01-05 MED ORDER — COLCHICINE 0.6 MG PO TABS: 0.6000 mg | ORAL_TABLET | Freq: Every day | ORAL | Status: DC | PRN

## 2013-01-05 MED ORDER — SUCCINYLCHOLINE CHLORIDE 20 MG/ML IJ SOLN
INTRAMUSCULAR | Status: DC | PRN
  Administered 2013-01-05: 100 mg via INTRAVENOUS

## 2013-01-05 MED ORDER — ONDANSETRON HCL 4 MG/2ML IJ SOLN: 4.0000 mg | Freq: Four times a day (QID) | INTRAMUSCULAR | Status: DC | PRN

## 2013-01-05 MED ORDER — METOCLOPRAMIDE HCL 5 MG/ML IJ SOLN: 5.0000 mg | Freq: Three times a day (TID) | INTRAMUSCULAR | Status: DC | PRN

## 2013-01-05 MED ORDER — DIPHENHYDRAMINE HCL 25 MG PO TABS
25.0000 mg | ORAL_TABLET | Freq: Four times a day (QID) | ORAL | Status: DC
  Administered 2013-01-05: 25 mg via ORAL

## 2013-01-05 MED ORDER — MORPHINE SULFATE 10 MG/ML IJ SOLN
INTRAMUSCULAR | Status: DC | PRN
  Administered 2013-01-05 (×2): 2 mg via INTRAVENOUS

## 2013-01-05 MED ORDER — FLUTICASONE PROPIONATE 50 MCG/ACT NA SUSP: 1.0000 | Freq: Every day | NASAL | Status: DC

## 2013-01-05 MED ORDER — HYDROMORPHONE HCL PF 1 MG/ML IJ SOLN: 0.5000 mg | INTRAMUSCULAR | Status: DC | PRN

## 2013-01-05 MED ORDER — METOCLOPRAMIDE HCL 5 MG PO TABS: 5.0000 mg | ORAL_TABLET | Freq: Three times a day (TID) | ORAL | Status: DC | PRN

## 2013-01-05 MED ORDER — EPINEPHRINE HCL 1 MG/ML IJ SOLN
INTRAMUSCULAR | Status: DC | PRN
  Administered 2013-01-05: 4 mg

## 2013-01-05 MED ORDER — INSULIN ASPART 100 UNIT/ML ~~LOC~~ SOLN
0.0000 [IU] | Freq: Every day | SUBCUTANEOUS | Status: DC
  Administered 2013-01-05: 4 [IU] via SUBCUTANEOUS

## 2013-01-05 MED ORDER — BISACODYL 5 MG PO TBEC
10.0000 mg | DELAYED_RELEASE_TABLET | Freq: Once | ORAL | Status: AC
  Administered 2013-01-05: 10 mg via ORAL

## 2013-01-05 MED ORDER — HYDROCODONE-ACETAMINOPHEN 5-325 MG PO TABS
1.0000 | ORAL_TABLET | ORAL | Status: DC | PRN
  Administered 2013-01-05 – 2013-01-06 (×3): 2 via ORAL

## 2013-01-05 MED ORDER — GABAPENTIN 100 MG PO CAPS
100.0000 mg | ORAL_CAPSULE | Freq: Three times a day (TID) | ORAL | Status: DC
  Administered 2013-01-05 (×2): 100 mg via ORAL

## 2013-01-05 MED ORDER — ONDANSETRON HCL 4 MG/2ML IJ SOLN
INTRAMUSCULAR | Status: DC | PRN
  Administered 2013-01-05: 4 mg via INTRAVENOUS

## 2013-01-05 MED ORDER — DEXAMETHASONE SODIUM PHOSPHATE 4 MG/ML IJ SOLN
INTRAMUSCULAR | Status: DC | PRN
  Administered 2013-01-05: 10 mg via INTRAVENOUS

## 2013-01-05 MED ORDER — PHENYLEPHRINE HCL 10 MG/ML IJ SOLN
10.0000 mg | INTRAVENOUS | Status: DC | PRN
  Administered 2013-01-05: 50 ug/min via INTRAVENOUS

## 2013-01-05 MED ORDER — HYDROCODONE-ACETAMINOPHEN 5-325 MG PO TABS: ORAL_TABLET | ORAL | Status: DC

## 2013-01-05 MED ORDER — FENTANYL CITRATE 0.05 MG/ML IJ SOLN
50.0000 ug | INTRAMUSCULAR | Status: DC | PRN
  Administered 2013-01-05: 100 ug via INTRAVENOUS

## 2013-01-05 MED ORDER — CALCIUM CARBONATE-VITAMIN D 300-100 MG-UNIT PO CAPS: 600.0000 mg | ORAL_CAPSULE | Freq: Two times a day (BID) | ORAL | Status: DC

## 2013-01-05 MED ORDER — OXYCODONE HCL 5 MG PO TABS: 5.0000 mg | ORAL_TABLET | Freq: Once | ORAL | Status: DC | PRN

## 2013-01-05 MED ORDER — LIDOCAINE HCL 4 % MT SOLN
OROMUCOSAL | Status: DC | PRN
  Administered 2013-01-05: 4 mL via TOPICAL

## 2013-01-05 MED ORDER — BUPIVACAINE-EPINEPHRINE 0.25% -1:200000 IJ SOLN
INTRAMUSCULAR | Status: DC | PRN
  Administered 2013-01-05: 50 mL

## 2013-01-05 MED ORDER — LIDOCAINE HCL (CARDIAC) 20 MG/ML IV SOLN
INTRAVENOUS | Status: DC | PRN
  Administered 2013-01-05: 60 mg via INTRAVENOUS

## 2013-01-05 MED ORDER — INSULIN GLARGINE 100 UNIT/ML ~~LOC~~ SOLN
20.0000 [IU] | Freq: Every day | SUBCUTANEOUS | Status: DC
  Administered 2013-01-05: 20 [IU] via SUBCUTANEOUS

## 2013-01-05 MED ORDER — ACETAMINOPHEN 10 MG/ML IV SOLN
1000.0000 mg | Freq: Once | INTRAVENOUS | Status: AC
  Administered 2013-01-05: 1000 mg via INTRAVENOUS

## 2013-01-05 MED ORDER — POTASSIUM CHLORIDE IN NACL 20-0.9 MEQ/L-% IV SOLN: INTRAVENOUS | Status: DC

## 2013-01-05 MED ORDER — OXYCODONE HCL 5 MG/5ML PO SOLN: 5.0000 mg | Freq: Once | ORAL | Status: DC | PRN

## 2013-01-05 MED ORDER — OXYCODONE-ACETAMINOPHEN 5-325 MG PO TABS: 1.0000 | ORAL_TABLET | ORAL | Status: DC | PRN

## 2013-01-05 MED ORDER — DOCUSATE SODIUM 100 MG PO CAPS
100.0000 mg | ORAL_CAPSULE | Freq: Two times a day (BID) | ORAL | Status: DC
  Administered 2013-01-05: 100 mg via ORAL

## 2013-01-05 MED ORDER — INSULIN ASPART 100 UNIT/ML ~~LOC~~ SOLN
0.0000 [IU] | Freq: Three times a day (TID) | SUBCUTANEOUS | Status: DC
  Administered 2013-01-05 – 2013-01-06 (×2): 3 [IU] via SUBCUTANEOUS

## 2013-01-05 MED ORDER — SODIUM CHLORIDE 0.9 % IV SOLN
INTRAVENOUS | Status: DC
  Administered 2013-01-05: 17:00:00 via INTRAVENOUS

## 2013-01-05 MED ORDER — CYCLOBENZAPRINE HCL 5 MG PO TABS: 5.0000 mg | ORAL_TABLET | Freq: Two times a day (BID) | ORAL | Status: DC | PRN

## 2013-01-05 MED ORDER — CEFAZOLIN SODIUM-DEXTROSE 2-3 GM-% IV SOLR
2.0000 g | Freq: Four times a day (QID) | INTRAVENOUS | Status: DC
  Administered 2013-01-05 – 2013-01-06 (×2): 2 g via INTRAVENOUS

## 2013-01-05 MED ORDER — ALLOPURINOL 100 MG PO TABS: 100.0000 mg | ORAL_TABLET | Freq: Every day | ORAL | Status: DC

## 2013-01-05 MED ORDER — ASPIRIN 81 MG PO TABS: 81.0000 mg | ORAL_TABLET | Freq: Every day | ORAL | Status: DC

## 2013-01-05 MED ORDER — LACTATED RINGERS IV SOLN
INTRAVENOUS | Status: DC
  Administered 2013-01-05: 12:00:00 via INTRAVENOUS

## 2013-01-05 MED ORDER — CEFAZOLIN SODIUM-DEXTROSE 2-3 GM-% IV SOLR: 2.0000 g | Freq: Four times a day (QID) | INTRAVENOUS | Status: DC

## 2013-01-05 MED ORDER — ONDANSETRON HCL 4 MG PO TABS: 4.0000 mg | ORAL_TABLET | Freq: Four times a day (QID) | ORAL | Status: DC | PRN

## 2013-01-05 SURGICAL SUPPLY — 74 items
ANCHOR PEEK SWIVEL LOCK 5.5 (Anchor) ×2 IMPLANT
BENZOIN TINCTURE PRP APPL 2/3 (GAUZE/BANDAGES/DRESSINGS) IMPLANT
BLADE CUDA 5.5 (BLADE) IMPLANT
BLADE CUTTER GATOR 3.5 (BLADE) ×2 IMPLANT
BLADE GREAT WHITE 4.2 (BLADE) IMPLANT
BLADE SURG 15 STRL LF DISP TIS (BLADE) IMPLANT
BLADE SURG 15 STRL SS (BLADE)
BLADE SURG ROTATE 9660 (MISCELLANEOUS) IMPLANT
BNDG COHESIVE 4X5 TAN STRL (GAUZE/BANDAGES/DRESSINGS) ×2 IMPLANT
BUR OVAL 6.0 (BURR) ×2 IMPLANT
CANISTER OMNI JUG 16 LITER (MISCELLANEOUS) ×2 IMPLANT
CANISTER SUCTION 2500CC (MISCELLANEOUS) IMPLANT
CANNULA TWIST IN 8.25X7CM (CANNULA) ×4 IMPLANT
DECANTER SPIKE VIAL GLASS SM (MISCELLANEOUS) IMPLANT
DRAPE SHOULDER BEACH CHAIR (DRAPES) ×2 IMPLANT
DRAPE U-SHAPE 47X51 STRL (DRAPES) ×4 IMPLANT
DRSG PAD ABDOMINAL 8X10 ST (GAUZE/BANDAGES/DRESSINGS) ×2 IMPLANT
DURAPREP 26ML APPLICATOR (WOUND CARE) ×2 IMPLANT
ELECT REM PT RETURN 9FT ADLT (ELECTROSURGICAL) ×2
ELECTRODE REM PT RTRN 9FT ADLT (ELECTROSURGICAL) ×1 IMPLANT
GAUZE XEROFORM 1X8 LF (GAUZE/BANDAGES/DRESSINGS) ×2 IMPLANT
GLOVE BIO SURGEON STRL SZ7 (GLOVE) ×2 IMPLANT
GLOVE BIOGEL PI IND STRL 7.0 (GLOVE) ×2 IMPLANT
GLOVE BIOGEL PI IND STRL 7.5 (GLOVE) ×2 IMPLANT
GLOVE BIOGEL PI INDICATOR 7.0 (GLOVE) ×2
GLOVE BIOGEL PI INDICATOR 7.5 (GLOVE) ×2
GLOVE SS BIOGEL STRL SZ 7.5 (GLOVE) ×1 IMPLANT
GLOVE SUPERSENSE BIOGEL SZ 7.5 (GLOVE) ×1
GOWN PREVENTION PLUS XLARGE (GOWN DISPOSABLE) ×8 IMPLANT
LOOP 2 FIBERLINK CLOSED (SUTURE) ×2 IMPLANT
NDL SAFETY ECLIPSE 18X1.5 (NEEDLE) ×1 IMPLANT
NDL SUT 6 .5 CRC .975X.05 MAYO (NEEDLE) IMPLANT
NEEDLE 1/2 CIR CATGUT .05X1.09 (NEEDLE) IMPLANT
NEEDLE HYPO 18GX1.5 SHARP (NEEDLE) ×1
NEEDLE MAYO TAPER (NEEDLE)
NEEDLE SCORPION (NEEDLE) ×2 IMPLANT
NEEDLE SCORPION MULTI FIRE (NEEDLE) ×2 IMPLANT
PACK ARTHROSCOPY DSU (CUSTOM PROCEDURE TRAY) ×2 IMPLANT
PACK BASIN DAY SURGERY FS (CUSTOM PROCEDURE TRAY) ×2 IMPLANT
PAD ALCOHOL SWAB (MISCELLANEOUS) ×4 IMPLANT
PENCIL BUTTON HOLSTER BLD 10FT (ELECTRODE) IMPLANT
SET ARTHROSCOPY TUBING (MISCELLANEOUS) ×1
SET ARTHROSCOPY TUBING LN (MISCELLANEOUS) ×1 IMPLANT
SHEET MEDIUM DRAPE 40X70 STRL (DRAPES) IMPLANT
SLEEVE SCD COMPRESS KNEE MED (MISCELLANEOUS) ×2 IMPLANT
SLING ARM FOAM STRAP LRG (SOFTGOODS) ×2 IMPLANT
SLING ARM FOAM STRAP MED (SOFTGOODS) IMPLANT
SLING ARM FOAM STRAP XLG (SOFTGOODS) IMPLANT
SLING ARM IMMOBILIZER MED (SOFTGOODS) IMPLANT
SLING ULTRA III MED (ORTHOPEDIC SUPPLIES) ×2 IMPLANT
SPONGE GAUZE 4X4 12PLY (GAUZE/BANDAGES/DRESSINGS) ×2 IMPLANT
SPONGE LAP 4X18 X RAY DECT (DISPOSABLE) IMPLANT
STRIP CLOSURE SKIN 1/2X4 (GAUZE/BANDAGES/DRESSINGS) IMPLANT
SUCTION FRAZIER TIP 10 FR DISP (SUCTIONS) IMPLANT
SUT ETHILON 3 0 PS 1 (SUTURE) ×2 IMPLANT
SUT FIBERWIRE #2 38 T-5 BLUE (SUTURE)
SUT PDS AB 2-0 CT2 27 (SUTURE) IMPLANT
SUT PROLENE 3 0 PS 2 (SUTURE) IMPLANT
SUT TIGER TAPE 7 IN WHITE (SUTURE) IMPLANT
SUT VIC AB 0 SH 27 (SUTURE) IMPLANT
SUT VIC AB 2-0 PS2 27 (SUTURE) IMPLANT
SUT VIC AB 2-0 SH 27 (SUTURE)
SUT VIC AB 2-0 SH 27XBRD (SUTURE) IMPLANT
SUTURE FIBERWR #2 38 T-5 BLUE (SUTURE) IMPLANT
SYR 20CC LL (SYRINGE) ×2 IMPLANT
SYR 5ML LL (SYRINGE) ×2 IMPLANT
SYR BULB 3OZ (MISCELLANEOUS) IMPLANT
TAPE FIBER 2MM 7IN #2 BLUE (SUTURE) ×2 IMPLANT
TAPE HYPAFIX 6X30 (GAUZE/BANDAGES/DRESSINGS) IMPLANT
TAPE STRIPS DRAPE STRL (GAUZE/BANDAGES/DRESSINGS) ×2 IMPLANT
TOWEL OR 17X24 6PK STRL BLUE (TOWEL DISPOSABLE) ×2 IMPLANT
TUBE CONNECTING 20X1/4 (TUBING) IMPLANT
WAND STAR VAC 90 (SURGICAL WAND) ×2 IMPLANT
WATER STERILE IRR 1000ML POUR (IV SOLUTION) ×2 IMPLANT

## 2013-01-05 NOTE — Anesthesia Procedure Notes (Signed)
Procedure Name: Intubation Performed by: Terrance Mass Pre-anesthesia Checklist: Patient identified, Timeout performed, Emergency Drugs available, Suction available and Patient being monitored Patient Re-evaluated:Patient Re-evaluated prior to inductionOxygen Delivery Method: Circle system utilized Preoxygenation: Pre-oxygenation with 100% oxygen Intubation Type: IV induction Ventilation: Mask ventilation without difficulty Laryngoscope Size: Miller and 2 Grade View: Grade II Tube type: Oral Tube size: 7.0 mm Number of attempts: 1 Airway Equipment and Method: Stylet Placement Confirmation: ETT inserted through vocal cords under direct vision,  breath sounds checked- equal and bilateral and positive ETCO2 Secured at: 22 cm Tube secured with: Tape Dental Injury: Teeth and Oropharynx as per pre-operative assessment

## 2013-01-05 NOTE — H&P (View-Only) (Signed)
Ann Sellers is an 79 y.o. female.   Chief Complaint: right shoulder rotator cuff HPI: Ms. Ann Sellers is an 79 year old seen for follow-up from her significant persistent right shoulder pain. She has significant pain. We injected her right shoulder on 11/17/12 with temporary relief. She fell re-injuring her right shoulder on 03/23/12 and we injected her shoulder at that time with temporary relief. She had a right shoulder MRI on 11/20/12 that showed a rotator cuff tear with impingement and AC joint arthropathy. She has pain on a daily basis. Pain with overhead use and activity relieved by rest but she has night pain as well.  Past Medical History  Diagnosis Date  . B12 deficiency   . Asthma   . Diabetes mellitus   . Hypertension   . Arthritis   . Neuromuscular disorder   . Wears glasses   . Wears dentures     top denture-bottom partial  . Wears hearing aid     both ears  . Right rotator cuff tear     Past Surgical History  Procedure Laterality Date  . Tonsillectomy  1936  . Appendectomy  1945  . Vesicovaginal fistula closure w/ tah  1968  . Herniated disc  1983  . Tympanostomy tube placement  1989  . Foot neuroma surgery  1994  . Left knee surgery  1998  . Esophageal stretch  2001  . Left knee replacement  2002  . Cervical stynosis  2005  . Right wrist fracture  2006  . Cataract extraction, bilateral  2008  . Left hand surgery  2011    gout  . Tonsillectomy      Family History  Problem Relation Age of Onset  . Pneumonia Mother   . Heart disease Sister   . Diabetes Sister   . Hypertension Sister    Social History:  reports that she quit smoking about 34 years ago. Her smoking use included Cigarettes. She has a 10 pack-year smoking history. She does not have any smokeless tobacco history on file. She reports that  drinks alcohol. She reports that she does not use illicit drugs.  Allergies:  Allergies  Allergen Reactions  . Snake Antivenin (Antivenin Crotalidae Polyvalent  (Snake Antivenin)) Other (See Comments)    unknown     (Not in a hospital admission)  No results found for this or any previous visit (from the past 48 hour(s)). No results found.  Review of Systems  Constitutional: Negative.   HENT: Positive for hearing loss.   Eyes: Positive for blurred vision.  Respiratory: Positive for cough.   Cardiovascular: Negative.   Gastrointestinal: Negative.   Genitourinary: Negative.   Musculoskeletal: Negative.   Skin: Negative.   Endo/Heme/Allergies: Bruises/bleeds easily.    There were no vitals taken for this visit. Physical Exam  Constitutional: She is oriented to person, place, and time. She appears well-developed and well-nourished.  HENT:  Head: Normocephalic and atraumatic.  Eyes: Conjunctivae are normal. Pupils are equal, round, and reactive to light.  Cardiovascular: Normal rate and regular rhythm.   Murmur heard. Respiratory: Effort normal.  GI: Soft.  Genitourinary:  Not pertinent to current symptomatology therefore not examined.  Musculoskeletal:  Examination of her right shoulder reveals forward flexion of 160 with pain and weakness, abduction of 150 with pain and weakness, internal and external rotation of 70 degrees with pain and weakness no instability. Exam of her left shoulder reveals full range of motion without pain weakness or instability. Vascular exam: pulses 2+  and symmetric.   Neurological: She is alert and oriented to person, place, and time.  Skin: Skin is warm and dry.  Psychiatric: She has a normal mood and affect. Her behavior is normal. Thought content normal.     Assessment Right shoulder rotator cuff tear. Diabetes. Hypertension. COPD. 12 years status post left total knee replacement - Dr. Durward Sellers. Status post bilateral carpal tunnel release - Dr. Joya Sellers. Status post C4-6 fusion - Dr. Joya Sellers.  Plan I talk to her and her son about this in detail. Would recommend with her significant pain lack of  response to conservative care and findings on MRI and exam that we proceed with right shoulder arthroscopy with rotator cuff repair. Discussed risks benefits and possible complications of the surgery in detail and she understands this completely. She'll need to be cleared preoperatively by Dr. Chapman Sellers.  Ann Sellers J 01/05/2013, 11:12 AM

## 2013-01-05 NOTE — Anesthesia Preprocedure Evaluation (Addendum)
Anesthesia Evaluation  Patient identified by MRN, date of birth, ID band Patient awake    Reviewed: Allergy & Precautions, H&P , NPO status , Patient's Chart, lab work & pertinent test results  Airway Mallampati: II TM Distance: >3 FB Neck ROM: Full    Dental no notable dental hx. (+) Edentulous Upper, Partial Lower and Dental Advisory Given   Pulmonary neg pulmonary ROS, asthma ,  breath sounds clear to auscultation  Pulmonary exam normal       Cardiovascular hypertension, On Medications Rhythm:Regular Rate:Normal     Neuro/Psych  Neuromuscular disease negative psych ROS   GI/Hepatic negative GI ROS, Neg liver ROS,   Endo/Other  diabetes, Type 1, Insulin Dependent  Renal/GU negative Renal ROS  negative genitourinary   Musculoskeletal   Abdominal   Peds  Hematology negative hematology ROS (+)   Anesthesia Other Findings   Reproductive/Obstetrics negative OB ROS                          Anesthesia Physical Anesthesia Plan  ASA: III  Anesthesia Plan: General and Regional   Post-op Pain Management:    Induction: Intravenous  Airway Management Planned: Oral ETT  Additional Equipment:   Intra-op Plan:   Post-operative Plan: Extubation in OR  Informed Consent: I have reviewed the patients History and Physical, chart, labs and discussed the procedure including the risks, benefits and alternatives for the proposed anesthesia with the patient or authorized representative who has indicated his/her understanding and acceptance.   Dental advisory given  Plan Discussed with: CRNA and Surgeon  Anesthesia Plan Comments: (ISB was aborted due to inability to visualize the necessary nerves with ultrasound guidance.)      Anesthesia Quick Evaluation

## 2013-01-05 NOTE — Interval H&P Note (Signed)
History and Physical Interval Note:  01/05/2013 1:05 PM  Ann Sellers  has presented today for surgery, with the diagnosis of right shoulder impengment and djd and rotator cuff tear  The various methods of treatment have been discussed with the patient and family. After consideration of risks, benefits and other options for treatment, the patient has consented to  Procedure(s): SHOULDER ARTHROSCOPY WITH ROTATOR CUFF REPAIR (Right) as a surgical intervention .  The patient's history has been reviewed, patient examined, no change in status, stable for surgery.  I have reviewed the patient's chart and labs.  Questions were answered to the patient's satisfaction.     Elsie Saas A

## 2013-01-05 NOTE — H&P (Signed)
Ann Sellers is an 79 y.o. female.   Chief Complaint: right shoulder rotator cuff HPI: Ann Sellers is an 79 year old seen for follow-up from her significant persistent right shoulder pain. She has significant pain. We injected her right shoulder on 11/17/12 with temporary relief. She fell re-injuring her right shoulder on 03/23/12 and we injected her shoulder at that time with temporary relief. She had a right shoulder MRI on 11/20/12 that showed a rotator cuff tear with impingement and AC joint arthropathy. She has pain on a daily basis. Pain with overhead use and activity relieved by rest but she has night pain as well.  Past Medical History  Diagnosis Date  . B12 deficiency   . Asthma   . Diabetes mellitus   . Hypertension   . Arthritis   . Neuromuscular disorder   . Wears glasses   . Wears dentures     top denture-bottom partial  . Wears hearing aid     both ears  . Right rotator cuff tear     Past Surgical History  Procedure Laterality Date  . Tonsillectomy  1936  . Appendectomy  1945  . Vesicovaginal fistula closure w/ tah  1968  . Herniated disc  1983  . Tympanostomy tube placement  1989  . Foot neuroma surgery  1994  . Left knee surgery  1998  . Esophageal stretch  2001  . Left knee replacement  2002  . Cervical stynosis  2005  . Right wrist fracture  2006  . Cataract extraction, bilateral  2008  . Left hand surgery  2011    gout  . Tonsillectomy      Family History  Problem Relation Age of Onset  . Pneumonia Mother   . Heart disease Sister   . Diabetes Sister   . Hypertension Sister    Social History:  reports that she quit smoking about 34 years ago. Her smoking use included Cigarettes. She has a 10 pack-year smoking history. She does not have any smokeless tobacco history on file. She reports that  drinks alcohol. She reports that she does not use illicit drugs.  Allergies:  Allergies  Allergen Reactions  . Snake Antivenin (Antivenin Crotalidae Polyvalent  (Snake Antivenin)) Other (See Comments)    unknown     (Not in a hospital admission)  No results found for this or any previous visit (from the past 48 hour(s)). No results found.  Review of Systems  Constitutional: Negative.   HENT: Positive for hearing loss.   Eyes: Positive for blurred vision.  Respiratory: Positive for cough.   Cardiovascular: Negative.   Gastrointestinal: Negative.   Genitourinary: Negative.   Musculoskeletal: Negative.   Skin: Negative.   Endo/Heme/Allergies: Bruises/bleeds easily.    There were no vitals taken for this visit. Physical Exam  Constitutional: She is oriented to person, place, and time. She appears well-developed and well-nourished.  HENT:  Head: Normocephalic and atraumatic.  Eyes: Conjunctivae are normal. Pupils are equal, round, and reactive to light.  Cardiovascular: Normal rate and regular rhythm.   Murmur heard. Respiratory: Effort normal.  GI: Soft.  Genitourinary:  Not pertinent to current symptomatology therefore not examined.  Musculoskeletal:  Examination of her right shoulder reveals forward flexion of 160 with pain and weakness, abduction of 150 with pain and weakness, internal and external rotation of 70 degrees with pain and weakness no instability. Exam of her left shoulder reveals full range of motion without pain weakness or instability. Vascular exam: pulses 2+  and symmetric.   Neurological: She is alert and oriented to person, place, and time.  Skin: Skin is warm and dry.  Psychiatric: She has a normal mood and affect. Her behavior is normal. Thought content normal.     Assessment Right shoulder rotator cuff tear. Diabetes. Hypertension. COPD. 12 years status post left total knee replacement - Dr. Durward Fortes. Status post bilateral carpal tunnel release - Dr. Joya Salm. Status post C4-6 fusion - Dr. Joya Salm.  Plan I talk to her and her son about this in detail. Would recommend with her significant pain lack of  response to conservative care and findings on MRI and exam that we proceed with right shoulder arthroscopy with rotator cuff repair. Discussed risks benefits and possible complications of the surgery in detail and she understands this completely. She'll need to be cleared preoperatively by Dr. Chapman Fitch.  [name] J 01/05/2013, 11:12 AM

## 2013-01-05 NOTE — Anesthesia Postprocedure Evaluation (Signed)
  Anesthesia Post-op Note  Patient: Ann Sellers  Procedure(s) Performed: Procedure(s): SHOULDER ARTHROSCOPY WITH ROTATOR CUFF REPAIR subchromial decompression and distal clavical excision (Right)  Patient Location: PACU  Anesthesia Type:General  Level of Consciousness: awake and alert   Airway and Oxygen Therapy: Patient Spontanous Breathing and Patient connected to nasal cannula oxygen  Post-op Pain: mild  Post-op Assessment: Post-op Vital signs reviewed, Patient's Cardiovascular Status Stable, Respiratory Function Stable, Patent Airway and No signs of Nausea or vomiting  Post-op Vital Signs: Reviewed and stable  Complications: No apparent anesthesia complications

## 2013-01-05 NOTE — Progress Notes (Signed)
Assisted Dr. Ola Spurr with right, ultrasound guided, interscalene block which was aborted due to difficult visualization. Side rails up, monitors on throughout. See vital signs in flow sheet.

## 2013-01-05 NOTE — Transfer of Care (Signed)
Immediate Anesthesia Transfer of Care Note  Patient: Ann Sellers  Procedure(s) Performed: Procedure(s): SHOULDER ARTHROSCOPY WITH ROTATOR CUFF REPAIR subchromial decompression and distal clavical excision (Right)  Patient Location: PACU  Anesthesia Type:General  Level of Consciousness: awake and alert   Airway & Oxygen Therapy: Patient Spontanous Breathing and Patient connected to face mask oxygen  Post-op Assessment: Report given to PACU RN and Post -op Vital signs reviewed and stable  Post vital signs: Reviewed and stable  Complications: No apparent anesthesia complications

## 2013-01-05 NOTE — Brief Op Note (Signed)
01/05/2013  2:19 PM  PATIENT:  Ann Sellers  79 y.o. female  PRE-OPERATIVE DIAGNOSIS:  right shoulder impengment and djd and rotator cuff tear  POST-OPERATIVE DIAGNOSIS:  right shoulder impengment and djd and rotator cuff tear  PROCEDURE:  Procedure(s): SHOULDER ARTHROSCOPY WITH ROTATOR CUFF REPAIR (Right)  SURGEON:  Surgeon(s) and Role:    * Lorn Junes, MD - Primary  PHYSICIAN ASSISTANT: Kirstin Shepperson PA-C   ASSISTANTS: Kirstin Shepperson PA_C   ANESTHESIA:   general  EBL:  Total I/O In: 1000 [I.V.:1000] Out: -   BLOOD ADMINISTERED:none  DRAINS: none   LOCAL MEDICATIONS USED:  MARCAINE     SPECIMEN:  No Specimen  DISPOSITION OF SPECIMEN:  N/A  COUNTS:  YES  TOURNIQUET:  * No tourniquets in log *  DICTATION: .Note written in EPIC  PLAN OF CARE: Admit for overnight observation  PATIENT DISPOSITION:  PACU - hemodynamically stable.   Delay start of Pharmacological VTE agent (>24hrs) due to surgical blood loss or risk of bleeding: not applicable

## 2013-01-06 ENCOUNTER — Encounter (HOSPITAL_BASED_OUTPATIENT_CLINIC_OR_DEPARTMENT_OTHER): Payer: Self-pay | Admitting: Orthopedic Surgery

## 2013-01-06 NOTE — Op Note (Signed)
NAMESARINAH, DUXBURY                ACCOUNT NO.:  [id number]  MEDICAL RECORD NO.:  [id number]  LOCATION:                                 FACILITY:  PHYSICIAN:  Baylei Siebels A. Noemi Chapel, M.D. DATE OF BIRTH:  January 31, 1925  DATE OF PROCEDURE:  01/05/2013 DATE OF DISCHARGE:                              OPERATIVE REPORT   PREOPERATIVE DIAGNOSES: 1. Right shoulder rotator cuff tear. 2. Right shoulder partial labrum tear and partial biceps tendon tear. 3. Right shoulder impingement. 4. Right shoulder acromioclavicular joint degenerative joint disease     and spurring.  POSTOPERATIVE DIAGNOSES: 1. Right shoulder rotator cuff tear. 2. Right shoulder partial labrum tear and partial biceps tendon tear. 3. Right shoulder impingement. 4. Right shoulder acromioclavicular joint degenerative joint disease     and spurring.  PROCEDURE: 1. Right shoulder examination under anesthesia, followed by     arthroscopically-assisted rotator cuff repair using Arthrex swivel     lock anchor x1. 2. Right shoulder debridement and partial labrum tear, partial biceps     tendon tear. 3. Right shoulder subacromial decompression. 4. Right shoulder distal clavicle excision.  SURGEON:  Audree Camel. Noemi Chapel, M.D.  ASSISTANT:  Kirstin Shepperson, PA-C.  ANESTHESIA:  General.  OPERATIVE TIME:  1 hour.  COMPLICATIONS:  None.  INDICATION FOR PROCEDURE:  Ms. Salay is an 79 year old woman who sustained an injury with a fall to her right shoulder approximately 3 months ago.  Exam and MRI has revealed a complete rotator cuff tear, partial labrum, partial biceps tendon tear with impingement, and AC joint arthropathy.  She has failed multiple conservative modalities and is now to undergo arthroscopy and repair.  DESCRIPTION:  Ms. Kaaihue was brought to the operating room on January 05, 2013, placed on operative table in supine position.  After being placed under general anesthesia, her right shoulder was examined.  She had  full range of motion and her shoulder was stable to ligamentous exam.  The subacromial space and joint was sterilely injected with 0.25% Marcaine with epinephrine.  She was then placed in beach-chair position and her shoulder and arm was prepped using sterile DuraPrep and draped using sterile technique.  Time-out procedure was called and the correct right shoulder identified.  She received antibiotics preoperatively for prophylaxis.  Initially, the arthroscopy was performed through a posterior arthroscopic portal, the arthroscope with a pump attached was placed into an anterior portal and arthroscopic probe was placed.  On initial inspection, the articular cartilage and the glenohumeral joint was intact.  She had partial tearing of the anterior and posterior labrum 25% which was debrided.  The anterior-inferior labrum and anterior-inferior glenohumeral ligament complex was intact.  The biceps tendon anchor showed partial tearing 25% which was debrided.  The biceps tendon showed 50% partial tearing which was debrided, but it was otherwise intact.  The rotator cuff showed a complete tear of the supraspinatus and the anterior 50% of the infraspinatus.  The rest of the rotator cuff was intact.  Inferior capsular recess free of pathology.  Subacromial space was entered and a lateral arthroscopic portal was made.  Moderately thickened bursitis was resected. Impingement was noted and a subacromial decompression was carried  out removing 6-8 mm of the undersurface of the anterolateral and anteromedial acromion and CA ligament release carried out as well.  The [facility] joint showed significant spurring and degenerative changes and the distal 8-10 mm of clavicle was resected with a 6 mm bur.  At this point, through an accessory lateral portal, the rotator cuff tear was repaired primarily with 1 fiber tape placed in a standard mattress suture technique and a fiber link as well.  All these sutures were  placed in a swivel lock which was deployed in the lateral aspect of the greater tuberosity with firm and tight fixation.  This completely repaired the rotator cuff back down to the greater tuberosity with firm and tight fixation.  The shoulder could be brought through a full range of motion with no impingement, but excellent stability on the repair.  At this point, it was felt that all pathology have been satisfactorily addressed.  The instruments were removed.  Portals were closed with 3-0 nylon suture.  Sterile dressings and a sling applied.  The patient awakened and taken to recovery room in stable condition.  FOLLOWUP CARE:  Ms. Chaput will be followed as an overnight recovery care patient for IV pain control, neurovascular monitoring.  Discharged tomorrow on Percocet and Valium in a sling.  Begin early physical therapy.  See back in office in a week for sutures out and followup.     Brionne Mertz A. Noemi Chapel, M.D.     RAW/MEDQ  D:  01/05/2013  T:  01/06/2013  Job:  [id number]

## 2013-11-30 ENCOUNTER — Other Ambulatory Visit: Payer: Self-pay | Admitting: Nephrology

## 2013-11-30 DIAGNOSIS — N189 Chronic kidney disease, unspecified: Secondary | ICD-10-CM

## 2013-12-02 ENCOUNTER — Ambulatory Visit
Admission: RE | Admit: 2013-12-02 | Discharge: 2013-12-02 | Disposition: A | Discharge status: Home / Self Care | Payer: Medicare Other | Source: Ambulatory Visit | Attending: Nephrology | Admitting: Nephrology

## 2013-12-02 DIAGNOSIS — N189 Chronic kidney disease, unspecified: Secondary | ICD-10-CM

## 2014-02-01 ENCOUNTER — Inpatient Hospital Stay (HOSPITAL_COMMUNITY)
Admission: EM | Admit: 2014-02-01 | Discharge: 2014-02-03 | DRG: 069 | Disposition: A | Discharge status: Home Health | Payer: Medicare Other | Attending: Internal Medicine | Admitting: Internal Medicine

## 2014-02-01 ENCOUNTER — Emergency Department (HOSPITAL_COMMUNITY): Payer: Medicare Other

## 2014-02-01 DIAGNOSIS — Z79899 Other long term (current) drug therapy: Secondary | ICD-10-CM

## 2014-02-01 DIAGNOSIS — E119 Type 2 diabetes mellitus without complications: Secondary | ICD-10-CM | POA: Diagnosis present

## 2014-02-01 DIAGNOSIS — Z96659 Presence of unspecified artificial knee joint: Secondary | ICD-10-CM

## 2014-02-01 DIAGNOSIS — N183 Chronic kidney disease, stage 3 unspecified: Secondary | ICD-10-CM | POA: Diagnosis present

## 2014-02-01 DIAGNOSIS — G459 Transient cerebral ischemic attack, unspecified: Principal | ICD-10-CM | POA: Diagnosis present

## 2014-02-01 DIAGNOSIS — I10 Essential (primary) hypertension: Secondary | ICD-10-CM | POA: Diagnosis present

## 2014-02-01 DIAGNOSIS — M129 Arthropathy, unspecified: Secondary | ICD-10-CM | POA: Diagnosis present

## 2014-02-01 DIAGNOSIS — Z794 Long term (current) use of insulin: Secondary | ICD-10-CM

## 2014-02-01 DIAGNOSIS — J45909 Unspecified asthma, uncomplicated: Secondary | ICD-10-CM | POA: Diagnosis present

## 2014-02-01 DIAGNOSIS — G709 Myoneural disorder, unspecified: Secondary | ICD-10-CM

## 2014-02-01 DIAGNOSIS — D320 Benign neoplasm of cerebral meninges: Secondary | ICD-10-CM | POA: Diagnosis present

## 2014-02-01 DIAGNOSIS — Z87891 Personal history of nicotine dependence: Secondary | ICD-10-CM

## 2014-02-01 DIAGNOSIS — R4789 Other speech disturbances: Secondary | ICD-10-CM | POA: Diagnosis present

## 2014-02-01 DIAGNOSIS — R2990 Unspecified symptoms and signs involving the nervous system: Secondary | ICD-10-CM

## 2014-02-01 DIAGNOSIS — E118 Type 2 diabetes mellitus with unspecified complications: Secondary | ICD-10-CM

## 2014-02-01 DIAGNOSIS — R29818 Other symptoms and signs involving the nervous system: Secondary | ICD-10-CM

## 2014-02-01 DIAGNOSIS — I129 Hypertensive chronic kidney disease with stage 1 through stage 4 chronic kidney disease, or unspecified chronic kidney disease: Secondary | ICD-10-CM | POA: Diagnosis present

## 2014-02-01 DIAGNOSIS — N184 Chronic kidney disease, stage 4 (severe): Secondary | ICD-10-CM | POA: Diagnosis present

## 2014-02-01 DIAGNOSIS — E538 Deficiency of other specified B group vitamins: Secondary | ICD-10-CM | POA: Diagnosis present

## 2014-02-01 LAB — CBC
HCT: 30.6 % — ABNORMAL LOW (ref 36.0–46.0)
Hemoglobin: 9.9 g/dL — ABNORMAL LOW (ref 12.0–15.0)
MCH: 30.1 pg (ref 26.0–34.0)
MCHC: 32.4 g/dL (ref 30.0–36.0)
MCV: 93 fL (ref 78.0–100.0)
Platelets: 200 10*3/uL (ref 150–400)
RBC: 3.29 MIL/uL — AB (ref 3.87–5.11)
RDW: 14.6 % (ref 11.5–15.5)
WBC: 6.4 10*3/uL (ref 4.0–10.5)

## 2014-02-01 LAB — COMPREHENSIVE METABOLIC PANEL
ALBUMIN: 3.2 g/dL — AB (ref 3.5–5.2)
ALK PHOS: 101 U/L (ref 39–117)
ALT: 9 U/L (ref 0–35)
AST: 11 U/L (ref 0–37)
BUN: 65 mg/dL — ABNORMAL HIGH (ref 6–23)
CALCIUM: 7.7 mg/dL — AB (ref 8.4–10.5)
CO2: 20 mEq/L (ref 19–32)
Chloride: 105 mEq/L (ref 96–112)
Creatinine, Ser: 2.56 mg/dL — ABNORMAL HIGH (ref 0.50–1.10)
GFR calc Af Amer: 18 mL/min — ABNORMAL LOW (ref >90)
GFR calc non Af Amer: 16 mL/min — ABNORMAL LOW (ref >90)
Glucose, Bld: 101 mg/dL — ABNORMAL HIGH (ref 70–99)
POTASSIUM: 5.8 meq/L — AB (ref 3.7–5.3)
SODIUM: 141 meq/L (ref 137–147)
TOTAL PROTEIN: 6 g/dL (ref 6.0–8.3)
Total Bilirubin: 0.4 mg/dL (ref 0.3–1.2)

## 2014-02-01 LAB — DIFFERENTIAL
BASOS PCT: 0 % (ref 0–1)
Basophils Absolute: 0 10*3/uL (ref 0.0–0.1)
EOS ABS: 0.2 10*3/uL (ref 0.0–0.7)
Eosinophils Relative: 3 % (ref 0–5)
Lymphocytes Relative: 15 % (ref 12–46)
Lymphs Abs: 1 10*3/uL (ref 0.7–4.0)
Monocytes Absolute: 0.4 10*3/uL (ref 0.1–1.0)
Monocytes Relative: 7 % (ref 3–12)
NEUTROS PCT: 75 % (ref 43–77)
Neutro Abs: 4.8 10*3/uL (ref 1.7–7.7)

## 2014-02-01 LAB — URINALYSIS, ROUTINE W REFLEX MICROSCOPIC
Bilirubin Urine: NEGATIVE
GLUCOSE, UA: NEGATIVE mg/dL
Hgb urine dipstick: NEGATIVE
KETONES UR: NEGATIVE mg/dL
Leukocytes, UA: NEGATIVE
Nitrite: NEGATIVE
PROTEIN: NEGATIVE mg/dL
Specific Gravity, Urine: 1.014 (ref 1.005–1.030)
UROBILINOGEN UA: 0.2 mg/dL (ref 0.0–1.0)
pH: 5 (ref 5.0–8.0)

## 2014-02-01 LAB — GLUCOSE, CAPILLARY: Glucose-Capillary: 85 mg/dL (ref 70–99)

## 2014-02-01 LAB — RAPID URINE DRUG SCREEN, HOSP PERFORMED
AMPHETAMINES: NOT DETECTED
BENZODIAZEPINES: NOT DETECTED
Barbiturates: NOT DETECTED
Cocaine: NOT DETECTED
Opiates: NOT DETECTED
TETRAHYDROCANNABINOL: NOT DETECTED

## 2014-02-01 LAB — I-STAT CHEM 8, ED
BUN: 61 mg/dL — ABNORMAL HIGH (ref 6–23)
CHLORIDE: 108 meq/L (ref 96–112)
Calcium, Ion: 0.98 mmol/L — ABNORMAL LOW (ref 1.13–1.30)
Creatinine, Ser: 2.8 mg/dL — ABNORMAL HIGH (ref 0.50–1.10)
GLUCOSE: 98 mg/dL (ref 70–99)
HCT: 31 % — ABNORMAL LOW (ref 36.0–46.0)
Hemoglobin: 10.5 g/dL — ABNORMAL LOW (ref 12.0–15.0)
POTASSIUM: 5.4 meq/L — AB (ref 3.7–5.3)
Sodium: 140 mEq/L (ref 137–147)
TCO2: 22 mmol/L (ref 0–100)

## 2014-02-01 LAB — I-STAT TROPONIN, ED: TROPONIN I, POC: 0.02 ng/mL (ref 0.00–0.08)

## 2014-02-01 LAB — APTT: aPTT: 35 seconds (ref 24–37)

## 2014-02-01 LAB — ETHANOL: Alcohol, Ethyl (B): <11 mg/dL (ref 0–11)

## 2014-02-01 LAB — PROTIME-INR
INR: 1.04 (ref 0.00–1.49)
Prothrombin Time: 13.4 seconds (ref 11.6–15.2)

## 2014-02-01 MED ORDER — GABAPENTIN 100 MG PO CAPS
100.0000 mg | ORAL_CAPSULE | Freq: Three times a day (TID) | ORAL | Status: DC
  Administered 2014-02-02 – 2014-02-03 (×5): 100 mg via ORAL
  Filled 2014-02-01 (×7): qty 1

## 2014-02-01 MED ORDER — FUROSEMIDE 20 MG PO TABS
20.0000 mg | ORAL_TABLET | Freq: Every day | ORAL | Status: DC
  Administered 2014-02-02 – 2014-02-03 (×2): 20 mg via ORAL
  Filled 2014-02-01 (×3): qty 1

## 2014-02-01 MED ORDER — LOSARTAN POTASSIUM 50 MG PO TABS
50.0000 mg | ORAL_TABLET | Freq: Every day | ORAL | Status: DC
  Administered 2014-02-02 – 2014-02-03 (×2): 50 mg via ORAL
  Filled 2014-02-01 (×3): qty 1

## 2014-02-01 MED ORDER — CYANOCOBALAMIN 1000 MCG/ML IJ SOLN: 1000.0000 ug | INTRAMUSCULAR | Status: DC

## 2014-02-01 MED ORDER — CLOPIDOGREL BISULFATE 75 MG PO TABS
75.0000 mg | ORAL_TABLET | Freq: Every day | ORAL | Status: DC
  Administered 2014-02-03: 75 mg via ORAL
  Filled 2014-02-01 (×2): qty 1

## 2014-02-01 MED ORDER — IPRATROPIUM-ALBUTEROL 0.5-2.5 (3) MG/3ML IN SOLN
3.0000 mL | Freq: Three times a day (TID) | RESPIRATORY_TRACT | Status: DC
  Administered 2014-02-01 – 2014-02-02 (×2): 3 mL via RESPIRATORY_TRACT
  Filled 2014-02-01 (×3): qty 3

## 2014-02-01 MED ORDER — ALLOPURINOL 100 MG PO TABS
100.0000 mg | ORAL_TABLET | Freq: Every day | ORAL | Status: DC
  Administered 2014-02-02 – 2014-02-03 (×2): 100 mg via ORAL
  Filled 2014-02-01 (×3): qty 1

## 2014-02-01 MED ORDER — DIPHENOXYLATE-ATROPINE 2.5-0.025 MG PO TABS: 1.0000 | ORAL_TABLET | Freq: Every day | ORAL | Status: DC | PRN

## 2014-02-01 MED ORDER — FLUTICASONE PROPIONATE 50 MCG/ACT NA SUSP
1.0000 | Freq: Every day | NASAL | Status: DC
  Administered 2014-02-01 – 2014-02-03 (×3): 1 via NASAL
  Filled 2014-02-01: qty 16

## 2014-02-01 MED ORDER — COLCHICINE 0.6 MG PO TABS: 0.6000 mg | ORAL_TABLET | Freq: Every day | ORAL | Status: DC | PRN

## 2014-02-01 MED ORDER — FUROSEMIDE 10 MG/ML IJ SOLN
20.0000 mg | Freq: Once | INTRAMUSCULAR | Status: AC
  Administered 2014-02-01: 20 mg via INTRAVENOUS
  Filled 2014-02-01: qty 2

## 2014-02-01 MED ORDER — HEPARIN SODIUM (PORCINE) 5000 UNIT/ML IJ SOLN
5000.0000 [IU] | Freq: Three times a day (TID) | INTRAMUSCULAR | Status: DC
  Administered 2014-02-01 – 2014-02-03 (×6): 5000 [IU] via SUBCUTANEOUS
  Filled 2014-02-01 (×8): qty 1

## 2014-02-01 MED ORDER — INSULIN GLARGINE 100 UNIT/ML ~~LOC~~ SOLN
20.0000 [IU] | Freq: Every day | SUBCUTANEOUS | Status: DC
  Administered 2014-02-02: 20 [IU] via SUBCUTANEOUS
  Filled 2014-02-01 (×3): qty 0.2

## 2014-02-01 MED ORDER — ALBUTEROL SULFATE (2.5 MG/3ML) 0.083% IN NEBU
2.5000 mg | INHALATION_SOLUTION | RESPIRATORY_TRACT | Status: DC | PRN
  Administered 2014-02-02: 2.5 mg via RESPIRATORY_TRACT
  Filled 2014-02-01: qty 3

## 2014-02-01 NOTE — ED Notes (Signed)
Daughters at bedside updated on pt's status and plan of care.

## 2014-02-01 NOTE — ED Provider Notes (Signed)
CSN: [id number]     Arrival date & time 02/01/14  1342 History   First MD Initiated Contact with Patient 02/01/14 1404     Chief Complaint  Patient presents with  . Stroke Symptoms    An emergency department physician performed an initial assessment on this suspected stroke patient at 1435. (Consider location/radiation/quality/duration/timing/severity/associated sxs/prior Treatment) HPI   80 year old female who presents today stating that she has had a sudden onset of speech difficulty that began at approximately 10 AM.  She was sleeping in the recliner this morning when she received a phone call. She spoke without difficulty during the phone call. She then called her son and was speaking to him without difficulty. Soon after that at approximately 10 AM she began having some difficulty speaking with stuttering noted. She has not had any other weakness noted. She denies visual changes. She does not have a headache and has no prior history of stroke or difficulty speaking. She is a diabetic but has not had anything to eat today for reports of her blood sugars were not low prior to evaluation. Initial report from nursing was last seen normal time was last night. However upon further investigation and discussion with the son by telephone it sounds like it was at 10 AM. Past Medical History  Diagnosis Date  . B12 deficiency   . Asthma   . Diabetes mellitus   . Hypertension   . Arthritis   . Neuromuscular disorder   . Wears glasses   . Wears dentures     top denture-bottom partial  . Wears hearing aid     both ears  . Right rotator cuff tear    Past Surgical History  Procedure Laterality Date  . Tonsillectomy  1936  . Appendectomy  1945  . Vesicovaginal fistula closure w/ tah  1968  . Herniated disc  1983  . Tympanostomy tube placement  1989  . Foot neuroma surgery  1994  . Left knee surgery  1998  . Esophageal stretch  2001  . Left knee replacement  2002  . Cervical stynosis  2005   . Right wrist fracture  2006  . Cataract extraction, bilateral  2008  . Left hand surgery  2011    gout  . Tonsillectomy    . Shoulder arthroscopy with rotator cuff repair Right 01/05/2013    Procedure: SHOULDER ARTHROSCOPY WITH ROTATOR CUFF REPAIR subchromial decompression and distal clavical excision;  Surgeon: Lorn Junes, MD;  Location: [facility];  Service: Orthopedics;  Laterality: Right;   Family History  Problem Relation Age of Onset  . Pneumonia Mother   . Heart disease Sister   . Diabetes Sister   . Hypertension Sister    History  Substance Use Topics  . Smoking status: Former Smoker -- 0.50 packs/day for 20 years    Types: Cigarettes    Quit date: 10/21/1978  . Smokeless tobacco: Not on file  . Alcohol Use: Yes     Comment: social drinker   OB History   Grav Para Term Preterm Abortions TAB SAB Ect Mult Living                 Review of Systems  All other systems reviewed and are negative.     Allergies  Snake antivenin  Home Medications   Prior to Admission medications   Medication Sig Start Date End Date Taking? Authorizing Provider  albuterol (PROVENTIL HFA;VENTOLIN HFA) 108 (90 BASE) MCG/ACT inhaler Inhale 2 puffs into  the lungs every 6 (six) hours as needed.      Historical Provider, MD  albuterol (PROVENTIL) (2.5 MG/3ML) 0.083% nebulizer solution Take 2.5 mg by nebulization 3 (three) times daily as needed.      Historical Provider, MD  allopurinol (ZYLOPRIM) 100 MG tablet Take 100 mg by mouth daily.      Historical Provider, MD  aspirin 81 MG tablet Take 81 mg by mouth daily.      Historical Provider, MD  B-D INS SYR MICROFINE 1CC/28G 28G X 1/2" 1 ML MISC Use as directed 06/27/11   Historical Provider, MD  Calcium Carbonate-Vitamin D (CALCIUM PLUS VITAMIN D PO) Take 1 tablet by mouth daily.      Historical Provider, MD  chlorpheniramine (CHLOR-TRIMETON) 4 MG tablet 2 by mouth at bedtime     Historical Provider, MD  colchicine 0.6 MG  tablet Take 0.6 mg by mouth daily as needed.     Historical Provider, MD  cyanocobalamin (,VITAMIN B-12,) 1000 MCG/ML injection Inject 1,000 mcg into the muscle every 30 (thirty) days.      Historical Provider, MD  cyclobenzaprine (FLEXERIL) 10 MG tablet Take 5 mg by mouth 2 (two) times daily as needed for muscle spasms.    Historical Provider, MD  furosemide (LASIX) 20 MG tablet Take 20 mg by mouth daily.    Historical Provider, MD  gabapentin (NEURONTIN) 100 MG capsule Take 100 mg by mouth 3 (three) times daily.    Historical Provider, MD  HYDROcodone-acetaminophen (NORCO) 5-325 MG per tablet 1-2 tablets every 4-6 hrs as needed for pain 01/05/13   Kirstin J Shepperson, PA-C  insulin glargine (LANTUS) 100 UNIT/ML injection Inject 20 Units into the skin at bedtime.     Historical Provider, MD  loratadine (CLARITIN) 10 MG tablet Take 1 tablet (10 mg total) by mouth daily. 09/23/11 09/22/12  Collene Gobble, MD  losartan (COZAAR) 50 MG tablet Take 50 mg by mouth daily.    Historical Provider, MD  mometasone (NASONEX) 50 MCG/ACT nasal spray Place 2 sprays into the nose daily.     Historical Provider, MD  Multiple Vitamins-Minerals (WOMENS DAILY FORMULA PO) Take 1 tablet by mouth daily.      Historical Provider, MD  sitaGLIPtin (JANUVIA) 100 MG tablet Take 100 mg by mouth daily.      Historical Provider, MD   BP 133/64  Pulse 57  Temp(Src) 98.1 F (36.7 C) (Oral)  Resp 18  SpO2 95% Physical Exam  Nursing note and vitals reviewed. Constitutional: She is oriented to person, place, and time. She appears well-developed and well-nourished.  HENT:  Head: Normocephalic.  Tongue contusion noted  Eyes: Conjunctivae and EOM are normal. Pupils are equal, round, and reactive to light.  Neck: Normal range of motion. Neck supple.  Cardiovascular: Normal rate, regular rhythm and normal heart sounds.   Pulmonary/Chest: Effort normal and breath sounds normal.  Abdominal: Soft. Bowel sounds are normal.   Musculoskeletal: Normal range of motion.  Neurological: She is alert and oriented to person, place, and time. She has normal strength and normal reflexes. She displays normal reflexes. No cranial nerve deficit or sensory deficit. She exhibits normal muscle tone. She displays a negative Romberg sign. Coordination normal. GCS eye subscore is 4. GCS verbal subscore is 5. GCS motor subscore is 6.  Intermittent stuttering speech.    ED Course  Procedures (including critical care time) Labs Review Labs Reviewed  CBC - Abnormal; Notable for the following:    RBC 3.29 (*)  Hemoglobin 9.9 (*)    HCT 30.6 (*)    All other components within normal limits  COMPREHENSIVE METABOLIC PANEL - Abnormal; Notable for the following:    Potassium 5.8 (*)    Glucose, Bld 101 (*)    BUN 65 (*)    Creatinine, Ser 2.56 (*)    Calcium 7.7 (*)    Albumin 3.2 (*)    GFR calc non Af Amer 16 (*)    GFR calc Af Amer 18 (*)    All other components within normal limits  I-STAT CHEM 8, ED - Abnormal; Notable for the following:    Potassium 5.4 (*)    BUN 61 (*)    Creatinine, Ser 2.80 (*)    Calcium, Ion 0.98 (*)    Hemoglobin 10.5 (*)    HCT 31.0 (*)    All other components within normal limits  ETHANOL  PROTIME-INR  APTT  DIFFERENTIAL  URINE RAPID DRUG SCREEN (HOSP PERFORMED)  URINALYSIS, ROUTINE W REFLEX MICROSCOPIC  I-STAT TROPOININ, ED  I-STAT TROPOININ, ED    Imaging Review Ct Head Wo Contrast  02/01/2014   CLINICAL DATA:  Code stroke, slurred speech.  EXAM: CT HEAD WITHOUT CONTRAST  TECHNIQUE: Contiguous axial images were obtained from the base of the skull through the vertex without intravenous contrast.  COMPARISON:  None.  FINDINGS: There is a 2.1 cm rounded hyperdense structure noted in the suprasellar cistern with scattered peripheral calcifications. 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 Differential considerations would also include  meningioma, less likely pituitary mass as this does not appear to extend into the sella turcica. No hemorrhage or hydrocephalus. No acute infarction. Scattered chronic microvascular changes in the deep white matter.  No acute calvarial abnormality. Visualized paranasal sinuses and mastoids clear. Orbital soft tissues unremarkable.  IMPRESSION: 2.1 cm rounded hyperdense structure anteriorly and the suprasellar cistern concerning for possible A-comm aneurysm. Differential considerations would include meningioma. Recommend MRI and MRA with and without contrast.   Electronically Signed   By: Rolm Baptise M.D.   On: 02/01/2014 14:57     EKG Interpretation   Date/Time:  Tuesday February 01 2014 13:52:34 EDT Ventricular Rate:  91 PR Interval:  153 QRS Duration: 101 QT Interval:  327 QTC Calculation: 402 R Axis:   13 Text Interpretation:  Sinus rhythm Low voltage, precordial leads      MDM   Final diagnoses:  None   Code stroke called and they have come and evaluate the patient. Given time frame and symptomatology TPA is not initiated. CT shows a 2.1 cm hyperdense structure concerning for large anterior communicating artery aneurysm and they have assumed care and will disposition patient.     Shaune Pollack, MD 02/02/14 1150

## 2014-02-01 NOTE — Consult Note (Signed)
Referring Physician: Ray    Chief Complaint: Code stroke  HPI:                                                                                                                                         Ann Sellers is an 80 y.o. female who woke up at 0900 hours and was noted to be normal. Her son fixed her breakfast around 10 am. It was at this time she was noted to have difficulty expressing herself and to have generalized weakness. patient was brought to [facility] hospital due to this.  On arrival to ED initially the LSN time was thought to be yesterday but in conversing with son, it became noted she was LSN at 10 AM this morning. Code stroke was called. Patient was brought to CT where no acute stroke was visualized but 2.1 cm rounded hyperdense structure anteriorly and the suprasellar cistern concerning for possible A-comm aneurysm was noted. At time of consultation patient was moving all extremities and only noted to have stuttering speech. Patient is 5 hours out and tPA was not given.    Date last known well: Date: 02/01/2014 Time last known well: Time: 10:00 tPA Given: No: out of window NIHSS 2  Past Medical History  Diagnosis Date  . B12 deficiency   . Asthma   . Diabetes mellitus   . Hypertension   . Arthritis   . Neuromuscular disorder   . Wears glasses   . Wears dentures     top denture-bottom partial  . Wears hearing aid     both ears  . Right rotator cuff tear     Past Surgical History  Procedure Laterality Date  . Tonsillectomy  1936  . Appendectomy  1945  . Vesicovaginal fistula closure w/ tah  1968  . Herniated disc  1983  . Tympanostomy tube placement  1989  . Foot neuroma surgery  1994  . Left knee surgery  1998  . Esophageal stretch  2001  . Left knee replacement  2002  . Cervical stynosis  2005  . Right wrist fracture  2006  . Cataract extraction, bilateral  2008  . Left hand surgery  2011    gout  . Tonsillectomy    . Shoulder arthroscopy with rotator cuff  repair Right 01/05/2013    Procedure: SHOULDER ARTHROSCOPY WITH ROTATOR CUFF REPAIR subchromial decompression and distal clavical excision;  Surgeon: Lorn Junes, MD;  Location: [place];  Service: Orthopedics;  Laterality: Right;    Family History  Problem Relation Age of Onset  . Pneumonia Mother   . Heart disease Sister   . Diabetes Sister   . Hypertension Sister    Social History:  reports that she quit smoking about 35 years ago. Her smoking use included Cigarettes. She has a 10 pack-year smoking history. She does not have any smokeless tobacco history on  file. She reports that she drinks alcohol. She reports that she does not use illicit drugs.  Allergies:  Allergies  Allergen Reactions  . Snake Antivenin [Antivenin Crotalidae Polyvalent (Snake Antivenin)] Other (See Comments)    unknown    Medications:                                                                                                                           No current facility-administered medications for this encounter.   Current Outpatient Prescriptions  Medication Sig Dispense Refill  . albuterol (PROVENTIL HFA;VENTOLIN HFA) 108 (90 BASE) MCG/ACT inhaler Inhale 2 puffs into the lungs every 6 (six) hours as needed.        Marland Kitchen albuterol (PROVENTIL) (2.5 MG/3ML) 0.083% nebulizer solution Take 2.5 mg by nebulization 3 (three) times daily as needed.        Marland Kitchen allopurinol (ZYLOPRIM) 100 MG tablet Take 100 mg by mouth daily.        Marland Kitchen aspirin 81 MG tablet Take 81 mg by mouth daily.        . B-D INS SYR MICROFINE 1CC/28G 28G X 1/2" 1 ML MISC Use as directed      . Calcium Carbonate-Vitamin D (CALCIUM PLUS VITAMIN D PO) Take 1 tablet by mouth daily.        . chlorpheniramine (CHLOR-TRIMETON) 4 MG tablet 2 by mouth at bedtime       . colchicine 0.6 MG tablet Take 0.6 mg by mouth daily as needed.       . cyanocobalamin (,VITAMIN B-12,) 1000 MCG/ML injection Inject 1,000 mcg into the muscle every 30  (thirty) days.        . cyclobenzaprine (FLEXERIL) 10 MG tablet Take 5 mg by mouth 2 (two) times daily as needed for muscle spasms.      . furosemide (LASIX) 20 MG tablet Take 20 mg by mouth daily.      Marland Kitchen gabapentin (NEURONTIN) 100 MG capsule Take 100 mg by mouth 3 (three) times daily.      Marland Kitchen HYDROcodone-acetaminophen (NORCO) 5-325 MG per tablet 1-2 tablets every 4-6 hrs as needed for pain  40 tablet  0  . insulin glargine (LANTUS) 100 UNIT/ML injection Inject 20 Units into the skin at bedtime.       Marland Kitchen loratadine (CLARITIN) 10 MG tablet Take 1 tablet (10 mg total) by mouth daily.  30 tablet  11  . losartan (COZAAR) 50 MG tablet Take 50 mg by mouth daily.      . mometasone (NASONEX) 50 MCG/ACT nasal spray Place 2 sprays into the nose daily.       . Multiple Vitamins-Minerals (WOMENS DAILY FORMULA PO) Take 1 tablet by mouth daily.        . sitaGLIPtin (JANUVIA) 100 MG tablet Take 100 mg by mouth daily.           ROS:  History obtained from the patient  General ROS: negative for - chills, fatigue, fever, night sweats, weight gain or weight loss Psychological ROS: negative for - behavioral disorder, hallucinations, memory difficulties, mood swings or suicidal ideation Ophthalmic ROS: negative for - blurry vision, double vision, eye pain or loss of vision ENT ROS: negative for - epistaxis, nasal discharge, oral lesions, sore throat, tinnitus or vertigo Allergy and Immunology ROS: negative for - hives or itchy/watery eyes Hematological and Lymphatic ROS: negative for - bleeding problems, bruising or swollen lymph nodes Endocrine ROS: negative for - galactorrhea, hair pattern changes, polydipsia/polyuria or temperature intolerance Respiratory ROS: negative for - cough, hemoptysis, shortness of breath or wheezing Cardiovascular ROS: negative for - chest pain, dyspnea  on exertion, edema or irregular heartbeat Gastrointestinal ROS: negative for - abdominal pain, diarrhea, hematemesis, nausea/vomiting or stool incontinence Genito-Urinary ROS: negative for - dysuria, hematuria, incontinence or urinary frequency/urgency Musculoskeletal ROS: negative for - joint swelling or muscular weakness Neurological ROS: as noted in HPI Dermatological ROS: negative for rash and skin lesion changes  Neurologic Examination:                                                                                                      Blood pressure 155/90, pulse 92, temperature 98.1 F (36.7 C), temperature source Oral, resp. rate 19, SpO2 92.00%.   Mental Status: Alert, oriented, thought content appropriate.  Speech slightly dysarthric without evidence of aphasia but did show intermittent stuttering.  Able to follow 3 step commands without difficulty. Cranial Nerves: II: Discs flat bilaterally; Visual fields grossly normal, pupils equal, round, reactive to light and accommodation III,IV, VI: ptosis not present, extra-ocular motions intact bilaterally V,VII: smile symmetric, facial light touch sensation normal bilaterally VIII: hearing normal bilaterally IX,X: gag reflex present XI: bilateral shoulder shrug XII: midline tongue extension without atrophy or fasciculations  Motor: Right : Upper extremity   5/5    Left:     Upper extremity   5/5  Lower extremity   5/5     Lower extremity   5/5 Tone and bulk:normal tone throughout; no atrophy noted Sensory: decreased sensation on the right arm but this is chronic.  Deep Tendon Reflexes:  Right: Upper Extremity   Left: Upper extremity   biceps (C-5 to C-6) 2/4   biceps (C-5 to C-6) 2/4 tricep (C7) 2/4    triceps (C7) 2/4 Brachioradialis (C6) 2/4  Brachioradialis (C6) 2/4  Lower Extremity Lower Extremity  quadriceps (L-2 to L-4) 2/4   quadriceps (L-2 to L-4) 2/4 Achilles (S1) 0/4   Achilles (S1) 0/4  Plantars: Right:  downgoing   Left: downgoing Cerebellar: normal finger-to-nose,  normal heel-to-shin test Gait: Not tested.  CV: pulses palpable throughout    Lab Results: Basic Metabolic Panel:  Recent Labs Lab 02/01/14 1451  NA 140  K 5.4*  CL 108  GLUCOSE 98  BUN 61*  CREATININE 2.80*    Liver Function Tests: No results found for this basename: AST, ALT, ALKPHOS, BILITOT, PROT, ALBUMIN,  in the last 168 hours No results found for this basename:  LIPASE, AMYLASE,  in the last 168 hours No results found for this basename: AMMONIA,  in the last 168 hours  CBC:  Recent Labs Lab 02/01/14 1428 02/01/14 1451  WBC 6.4  --   NEUTROABS 4.8  --   HGB 9.9* 10.5*  HCT 30.6* 31.0*  MCV 93.0  --   PLT 200  --     Cardiac Enzymes: No results found for this basename: CKTOTAL, CKMB, CKMBINDEX, TROPONINI,  in the last 168 hours  Lipid Panel: No results found for this basename: CHOL, TRIG, HDL, CHOLHDL, VLDL, LDLCALC,  in the last 168 hours  CBG: No results found for this basename: GLUCAP,  in the last 168 hours  Microbiology: No results found for this or any previous visit.  Coagulation Studies:  Recent Labs  02/01/14 1428  LABPROT 13.4  INR 1.04    Imaging: Ct Head Wo Contrast  02/01/2014   CLINICAL DATA:  Code stroke, slurred speech.  EXAM: CT HEAD WITHOUT CONTRAST  TECHNIQUE: Contiguous axial images were obtained from the base of the skull through the vertex without intravenous contrast.  COMPARISON:  None.  FINDINGS: There is a 2.1 cm rounded hyperdense structure noted in the suprasellar cistern with scattered peripheral calcifications. 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 Differential considerations would also include meningioma, less likely pituitary mass as this does not appear to extend into the sella turcica. No hemorrhage or hydrocephalus. No acute infarction. Scattered chronic microvascular changes in the deep white matter.  No  acute calvarial abnormality. Visualized paranasal sinuses and mastoids clear. Orbital soft tissues unremarkable.  IMPRESSION: 2.1 cm rounded hyperdense structure anteriorly and the suprasellar cistern concerning for possible A-comm aneurysm. Differential considerations would include meningioma. Recommend MRI and MRA with and without contrast.   Electronically Signed   By: Rolm Baptise M.D.   On: 02/01/2014 14:57    Etta Quill PA-C [facility] [id number]  02/01/2014, 3:23 PM   Patient seen and examined.  Clinical course and management discussed.  Necessary edits performed.  I agree with the above.  Assessment and plan of care developed and discussed below.    Assessment: 80 y.o. female presenting with difficulty with speech.  Head CT reviewed and shows no evidence of any acute changes but does show what appears to be a large A-comm aneurysm vs a meningioma.  Further work up recommended. Patient is outside of the window for tPA.    Stroke Risk Factors - diabetes mellitus and hypertension  Recommendations: 1.  MRI/MRA of the brain without contrast 2.  Stroke w/u to be initiated based on results of MRI.  Patient may also require a neurosurgery consult as well.   3.  Will follow up   Alexis Goodell, MD [facility] [phone]  02/01/2014  4:36 PM  Addendum: MRI reviewed and shows a meningioma with no evidence of an acute infarct.  TIA remains on the differential.  Patient will require further work up.  Recommendations: 1.  Echocardiogram 2.  Carotid doppler 3.  Continue ASA 4.  Speech therapy 5.  Telemetry 6.  Frequent neuro checks 7.  No neurosurgery consult indicated  Alexis Goodell, MD [facility] [phone]

## 2014-02-01 NOTE — ED Notes (Signed)
Attempted report 

## 2014-02-01 NOTE — Code Documentation (Signed)
80 yo female who was at home with her son this AM.  She slept in the recliner as usual last night, and spoke to her insurance agent without any difficulty at 0930, and conversation with her son at 71. At 1100 she was found to have speech changes, slurring and stuttering. EMS was called and activated code stroke. On arrival she was cleared at the bridge and taken to CT which was read as A-comm aneurysm vs. Meningioma.She has some numbness of her RUE which she reports is chronic. She also reports swallowing problems and we will defer the RN swallow screen and consult SLP. The NIHSS=2 (see doc flowsheet). MRI was requested. Family and pt aware of concerns and agreeable with diagnostic workup. Handoff with ED RN.

## 2014-02-01 NOTE — ED Notes (Signed)
Pt undressed, in gown, on monitor, continuous pulse oximetry and blood pressure cuff 

## 2014-02-01 NOTE — ED Notes (Addendum)
This RN called MRI to inform them that the pt has metal plates in her right arm and neck, and an artificial left knee. This RN was informed that it was okay for the patient to have her scan. Doy Mince, MD is aware.

## 2014-02-01 NOTE — ED Notes (Addendum)
Per EMS: Pt from home with c/o expressive aphagia, slurred speech and generalized weakness since 0900 upon awakening. Pt LSN last night. Recently dx with left lower leg blood clot. Pt denies taking blood thinners. Pt AO x 4. Grips equal, follows commands. Ambulatory on scene. PERRLA. VSS. CBG 104.  Hx: HTN, DM

## 2014-02-01 NOTE — H&P (Signed)
PATIENT DETAILS Name: Ann Sellers Age: 80 y.o. Sex: female Date of Birth: 06/25/1925 Admit Date: 02/01/2014 [id number], CAMMIE, MD   CHIEF COMPLAINT:  Slurring of speech/difficulty  HPI: Ann Sellers is a 80 y.o. female with a Past Medical History of one kidney disease stage III, diabetes, hypertension, bronchial asthma who presents today with the above noted complaint. Approximately around 10:00 AM, patient's son noted patient to have slurred speech and generalized weakness. Patient describes the speech difficulty as expressing words, but son also claims that she was having slurring of speech. Patient did not have any focal deficits. Patient was then brought to the ED for concern of stroke. CT of the head showed no acute stroke, but there was a 2.1cm rounded hyperdense structure anteriorly and the suprasellar cistern concerning for possible A-comm aneurysm was noted. He was consulted, patient was apparently 5 hours from initial presentation, and as a result TPA was not given. 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 In the ED, patient speaks spontaneously improved, she was thought to have a TIA, I was subsequently asked to admit this patient for further evaluation and treatment. There is no history of fever, headache, nausea, vomiting diarrhea. There is a history of abdominal pain. No dysuria as well.  ALLERGIES:   Allergies  Allergen Reactions  . Snake Antivenin [Antivenin Crotalidae Polyvalent (Snake Antivenin)] Other (See Comments)    unknown    PAST MEDICAL HISTORY: Past Medical History  Diagnosis Date  . B12 deficiency   . Asthma   . Diabetes mellitus   . Hypertension   . Arthritis   . Neuromuscular disorder   . Wears glasses   . Wears dentures     top denture-bottom partial    . Wears hearing aid     both ears  . Right rotator cuff tear     PAST SURGICAL HISTORY: Past Surgical History  Procedure Laterality Date  . Tonsillectomy  1936  . Appendectomy  1945  . Vesicovaginal fistula closure w/ tah  1968  . Herniated disc  1983  . Tympanostomy tube placement  1989  . Foot neuroma surgery  1994  . Left knee surgery  1998  . Esophageal stretch  2001  . Left knee replacement  2002  . Cervical stynosis  2005  . Right wrist fracture  2006  . Cataract extraction, bilateral  2008  . Left hand surgery  2011    gout  . Tonsillectomy    . Shoulder arthroscopy with rotator cuff repair Right 01/05/2013    Procedure: SHOULDER ARTHROSCOPY WITH ROTATOR CUFF REPAIR subchromial decompression and distal clavical excision;  Surgeon: Lorn Junes, MD;  Location: [place];  Service: Orthopedics;  Laterality: Right;    MEDICATIONS AT HOME: Prior to Admission medications   Medication Sig Start Date End Date Taking? Authorizing Provider  albuterol (PROVENTIL HFA;VENTOLIN HFA) 108 (90 BASE) MCG/ACT inhaler Inhale 2 puffs into the lungs every 6 (six) hours as needed.     Yes Historical Provider, MD  albuterol (PROVENTIL) (2.5 MG/3ML) 0.083% nebulizer solution Take 2.5 mg by nebulization 3 (three) times daily as needed.     Yes Historical Provider, MD  alendronate (FOSAMAX) 70 MG tablet Take 70 mg by mouth every Sunday. Take with a full glass of  water on an empty stomach.   Yes Historical Provider, MD  allopurinol (ZYLOPRIM) 100 MG tablet Take 100 mg by mouth daily.     Yes Historical Provider, MD  aspirin 81 MG tablet Take 81 mg by mouth daily.     Yes Historical Provider, MD  Calcium Carbonate-Vitamin D (CALCIUM PLUS VITAMIN D PO) Take 1 tablet by mouth daily.     Yes Historical Provider, MD  chlorpheniramine (CHLOR-TRIMETON) 4 MG tablet Take 8 mg by mouth at bedtime. 2 by mouth at bedtime   Yes Historical Provider, MD  colchicine 0.6 MG tablet Take 0.6 mg by  mouth daily as needed (for gout flareups).    Yes Historical Provider, MD  cyanocobalamin (,VITAMIN B-12,) 1000 MCG/ML injection Inject 1,000 mcg into the muscle every 30 (thirty) days.     Yes Historical Provider, MD  diphenoxylate-atropine (LOMOTIL) 2.5-0.025 MG per tablet Take 1 tablet by mouth daily as needed for diarrhea or loose stools.   Yes Historical Provider, MD  furosemide (LASIX) 20 MG tablet Take 20 mg by mouth daily.   Yes Historical Provider, MD  gabapentin (NEURONTIN) 100 MG capsule Take 100 mg by mouth 3 (three) times daily.   Yes Historical Provider, MD  insulin glargine (LANTUS) 100 UNIT/ML injection Inject 20 Units into the skin at bedtime.    Yes Historical Provider, MD  losartan (COZAAR) 50 MG tablet Take 50 mg by mouth daily.   Yes Historical Provider, MD  mometasone (NASONEX) 50 MCG/ACT nasal spray Place 2 sprays into the nose daily.    Yes Historical Provider, MD  Multiple Vitamins-Minerals (WOMENS DAILY FORMULA PO) Take 1 tablet by mouth daily.     Yes Historical Provider, MD  sitaGLIPtin (JANUVIA) 100 MG tablet Take 100 mg by mouth daily.     Yes Historical Provider, MD  triamcinolone cream (KENALOG) 0.1 % Apply 1 application topically 2 (two) times daily.   Yes Historical Provider, MD    FAMILY HISTORY: Family History  Problem Relation Age of Onset  . Pneumonia Mother   . Heart disease Sister   . Diabetes Sister   . Hypertension Sister     SOCIAL HISTORY:  reports that she quit smoking about 35 years ago. Her smoking use included Cigarettes. She has a 10 pack-year smoking history. She does not have any smokeless tobacco history on file. She reports that she drinks alcohol. She reports that she does not use illicit drugs.  REVIEW OF SYSTEMS:  Constitutional:   No  weight loss, night sweats,  Fevers, chills, fatigue.  HEENT:    No headaches, Difficulty swallowing,Tooth/dental problems,Sore throat,  No sneezing, itching, ear ache, nasal congestion, post nasal  drip,   Cardio-vascular: No chest pain,  Orthopnea, PND, swelling in lower extremities, anasarca,  dizziness, palpitations  GI:  No heartburn, indigestion, abdominal pain, nausea, vomiting, diarrhea, change in       bowel habits, loss of appetite  Resp: No shortness of breath with exertion or at rest.  No excess mucus, no productive cough, No non-productive cough,  No coughing up of blood.No change in color of mucus.No wheezing.No chest wall deformity  Skin:  no rash or lesions.  GU:  no dysuria, change in color of urine, no urgency or frequency.  No flank pain.  Musculoskeletal: No joint pain or swelling.  No decreased range of motion.  No back pain.  Psych: No change in mood or affect. No depression or anxiety.  No memory loss.   PHYSICAL EXAM: Blood  pressure 151/52, pulse 92, temperature 98.1 F (36.7 C), temperature source Oral, resp. rate 13, SpO2 97.00%.  General appearance :Awake, alert, not in any distress. Speech Clear. Not toxic Looking HEENT: Atraumatic and Normocephalic, pupils equally reactive to light and accomodation Neck: supple, no JVD. No cervical lymphadenopathy.  Chest:Good air entry bilaterally, scattered rhonchi. CVS: S1 S2 regular, no murmurs.  Abdomen: Bowel sounds present, Non tender and not distended with no gaurding, rigidity or rebound. Extremities: B/L Lower Ext shows no edema, both legs are warm to touch Neurology: Awake alert, and oriented X 3, CN II-XII intact, Non focal Skin:No Rash Wounds:N/A  LABS ON ADMISSION:   Recent Labs  02/01/14 1428 02/01/14 1451  NA 141 140  K 5.8* 5.4*  CL 105 108  CO2 20  --   GLUCOSE 101* 98  BUN 65* 61*  CREATININE 2.56* 2.80*  CALCIUM 7.7*  --     Recent Labs  02/01/14 1428  AST 11  ALT 9  ALKPHOS 101  BILITOT 0.4  PROT 6.0  ALBUMIN 3.2*   No results found for this basename: LIPASE, AMYLASE,  in the last 72 hours  Recent Labs  02/01/14 1428 02/01/14 1451  WBC 6.4  --   NEUTROABS  4.8  --   HGB 9.9* 10.5*  HCT 30.6* 31.0*  MCV 93.0  --   PLT 200  --    No results found for this basename: CKTOTAL, CKMB, CKMBINDEX, TROPONINI,  in the last 72 hours No results found for this basename: DDIMER,  in the last 72 hours No components found with this basename: POCBNP,    RADIOLOGIC STUDIES ON ADMISSION: Ct Head Wo Contrast  02/01/2014   CLINICAL DATA:  Code stroke, slurred speech.  EXAM: CT HEAD WITHOUT CONTRAST  TECHNIQUE: Contiguous axial images were obtained from the base of the skull through the vertex without intravenous contrast.  COMPARISON:  None.  FINDINGS: There is a 2.1 cm rounded hyperdense structure noted in the suprasellar cistern with scattered peripheral calcifications. 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 Differential considerations would also include meningioma, less likely pituitary mass as this does not appear to extend into the sella turcica. No hemorrhage or hydrocephalus. No acute infarction. Scattered chronic microvascular changes in the deep white matter.  No acute calvarial abnormality. Visualized paranasal sinuses and mastoids clear. Orbital soft tissues unremarkable.  IMPRESSION: 2.1 cm rounded hyperdense structure anteriorly and the suprasellar cistern concerning for possible A-comm aneurysm. Differential considerations would include meningioma. Recommend MRI and MRA with and without contrast.   Electronically Signed   By: Rolm Baptise M.D.   On: 02/01/2014 14:57   [name] Wo Contrast  02/01/2014   CLINICAL DATA:  80 year old female presenting with code stroke, slurred speech. Central skullbase or brain mass on noncontrast head CT today for further evaluation. Renal insufficiency precludes IV contrast at this time. Initial encounter.  EXAM: MRI HEAD WITHOUT CONTRAST  MRA HEAD WITHOUT CONTRAST  TECHNIQUE: Multiplanar, multiecho pulse sequences of the brain and surrounding structures were obtained without  intravenous contrast. Angiographic images of the head were obtained using MRA technique without contrast.  COMPARISON:  Non contrast head CT 1446 hr the same day.  FINDINGS: MRI HEAD FINDINGS  The CT hyperdense CT lesion corresponds to an extra-axial mass arising from the sphenoid planum (series 5, image 12) encompassing 22 x 19 x 18 mm. This does mildly splayed the carotid termini and ACAs, but is without pulsation artifact or  internal heterogeneity, being homogeneously isointense to gray matter on T1 and T2 sequences. The also MRA findings below. Minimal mass effect on the inferior frontal gyri with no associated cerebral edema. The mass also laterally displaces the proximal optic nerves. The suprasellar cistern remains patent. The pituitary infundibulum and gland do not appear affected.  No restricted diffusion or evidence of acute infarction. Major intracranial vascular flow voids are preserved.  No midline shift, ventriculomegaly, extra-axial collection or acute intracranial hemorrhage. Cervicomedullary junction within normal limits. Susceptibility artifact in the cervical spine compatible with prior surgery. Minimal to mild for age patchy nonspecific cerebral white matter T2 and FLAIR hyperintensity. No cortical encephalomalacia. There are occasional chronic micro hemorrhages in the brain (right posterior temporal lobe series 10, image 124). Cerebral volume is within normal limits for age.  Normal bone marrow signal. Postoperative changes to the globes. Minor paranasal sinus mucosal thickening. Visible internal auditory structures appear normal. Mastoids are clear. Negative scalp soft tissues.  MRA HEAD FINDINGS  Antegrade flow in the posterior circulation with codominant distal vertebral arteries. Normal PICA origins. Normal vertebrobasilar junction. Normal right AICA origin. No basilar stenosis. SCA and PCA origins are normal. Normal posterior communicating arteries. Normal bilateral PCA branches.  Antegrade  flow in both ICA siphons. Tortuous distal cervical ICAs. Tortuous cavernous ICA is. Normal ophthalmic and posterior communicating artery origins.  Normal carotid termini. Normal MCA and ACA origins. Normal A1 segments and anterior communicating artery (series 6, image 105). The central skullbase mass is devoid of any flow signal (series 6, image 96). Visible bilateral ACA branches are within normal limits. Visible bilateral MCA branches are within normal limits.  IMPRESSION: 1. IV contrast could not be utilized due to renal insufficiency, but the hyperdense central skullbase mass seen on CT has non contrast MRI and MRA characteristics consistent with sphenoid planum meningioma. No associated cerebral edema. Mild regional mass effect. 2. No acute infarct or acute intracranial abnormality identified. 3. Negative for age intracranial MRA.   Electronically Signed   By: Lars Pinks M.D.   On: 02/01/2014 17:22   Mr Brain Wo Contrast  02/01/2014   CLINICAL DATA:  80 year old female presenting with code stroke, slurred speech. Central skullbase or brain mass on noncontrast head CT today for further evaluation. Renal insufficiency precludes IV contrast at this time. Initial encounter.  EXAM: MRI HEAD WITHOUT CONTRAST  MRA HEAD WITHOUT CONTRAST  TECHNIQUE: Multiplanar, multiecho pulse sequences of the brain and surrounding structures were obtained without intravenous contrast. Angiographic images of the head were obtained using MRA technique without contrast.  COMPARISON:  Non contrast head CT 1446 hr the same day.  FINDINGS: MRI HEAD FINDINGS  The CT hyperdense CT lesion corresponds to an extra-axial mass arising from the sphenoid planum (series 5, image 12) encompassing 22 x 19 x 18 mm. This does mildly splayed the carotid termini and ACAs, but is without pulsation artifact or internal heterogeneity, being homogeneously isointense to gray matter on T1 and T2 sequences. The also MRA findings below. Minimal mass effect on  the inferior frontal gyri with no associated cerebral edema. The mass also laterally displaces the proximal optic nerves. The suprasellar cistern remains patent. The pituitary infundibulum and gland do not appear affected.  No restricted diffusion or evidence of acute infarction. Major intracranial vascular flow voids are preserved.  No midline shift, ventriculomegaly, extra-axial collection or acute intracranial hemorrhage. Cervicomedullary junction within normal limits. Susceptibility artifact in the cervical spine compatible with prior surgery. Minimal to mild for age  patchy nonspecific cerebral white matter T2 and FLAIR hyperintensity. No cortical encephalomalacia. There are occasional chronic micro hemorrhages in the brain (right posterior temporal lobe series 10, image 124). Cerebral volume is within normal limits for age.  Normal bone marrow signal. Postoperative changes to the globes. Minor paranasal sinus mucosal thickening. Visible internal auditory structures appear normal. Mastoids are clear. Negative scalp soft tissues.  MRA HEAD FINDINGS  Antegrade flow in the posterior circulation with codominant distal vertebral arteries. Normal PICA origins. Normal vertebrobasilar junction. Normal right AICA origin. No basilar stenosis. SCA and PCA origins are normal. Normal posterior communicating arteries. Normal bilateral PCA branches.  Antegrade flow in both ICA siphons. Tortuous distal cervical ICAs. Tortuous cavernous ICA is. Normal ophthalmic and posterior communicating artery origins.  Normal carotid termini. Normal MCA and ACA origins. Normal A1 segments and anterior communicating artery (series 6, image 105). The central skullbase mass is devoid of any flow signal (series 6, image 96). Visible bilateral ACA branches are within normal limits. Visible bilateral MCA branches are within normal limits.  IMPRESSION: 1. IV contrast could not be utilized due to renal insufficiency, but the hyperdense central  skullbase mass seen on CT has non contrast MRI and MRA characteristics consistent with sphenoid planum meningioma. No associated cerebral edema. Mild regional mass effect. 2. No acute infarct or acute intracranial abnormality identified. 3. Negative for age intracranial MRA.   Electronically Signed   By: Lars Pinks M.D.   On: 02/01/2014 17:22     EKG: Independently reviewed. Normal sinus rhythm  ASSESSMENT AND PLAN: Present on Admission:  . TIA (transient ischemic attack) - Suspected dysarthria secondary to TIA. Initial CT of the head was concerning for a aneurysm, however MRI/MRA of the brain shows the lesion seen on the CT scan likely to be a meningioma. Patient has multiple risk factors for atherosclerotic disease, is already on aspirin as outpatient, will switch over to Plavix. Obtain echocardiogram and carotid Doppler. Will need further evaluation by the stroke team.  - Will check A1c and lipids.   . Diabetes mellitus - Check A1c, continue Lantus, start SSI.   Marland Kitchen Hypertension - Continue with losartan-patient has chronic kidney disease but seems to be on losartan chronically. Continue to monitor potassium and creatinine.   . CKD (chronic kidney disease), stage III - Monitor electrolytes periodically   . Asthma - No wheezing, patient claims she has no shortness of breath. Will start on nebulized bronchodilators    Further plan will depend as patient's clinical course evolves and further radiologic and laboratory data become available. Patient will be monitored closely.  Above noted plan was discussed with patient/family, they were in agreement.   DVT Prophylaxis: Prophylactic Heparin  Code Status: Full Code  Total time spent for admission equals 45 minutes.  Jonetta Osgood [facility] Pager [phone]  If 7PM-7AM, please contact night-coverage [email or web address] Password [facility] 02/01/2014, 7:03 PM

## 2014-02-01 NOTE — ED Notes (Signed)
Per son who is now at bedside, pt woke up this morning and spoke  With an insurance agent at 0930 and her voice sounded normal. He states at 1000 "I thought she was just tired and falling asleep, but her voice sound different." LSN is now 0930.

## 2014-02-02 ENCOUNTER — Inpatient Hospital Stay (HOSPITAL_COMMUNITY): Payer: Medicare Other

## 2014-02-02 ENCOUNTER — Encounter (HOSPITAL_COMMUNITY): Payer: Self-pay | Admitting: *Deleted

## 2014-02-02 DIAGNOSIS — I359 Nonrheumatic aortic valve disorder, unspecified: Secondary | ICD-10-CM

## 2014-02-02 DIAGNOSIS — G709 Myoneural disorder, unspecified: Secondary | ICD-10-CM

## 2014-02-02 LAB — HEMOGLOBIN A1C
Hgb A1c MFr Bld: 7.7 % — ABNORMAL HIGH (ref <5.7)
Mean Plasma Glucose: 174 mg/dL — ABNORMAL HIGH (ref <117)

## 2014-02-02 LAB — LIPID PANEL
CHOL/HDL RATIO: 2.7 ratio
CHOLESTEROL: 141 mg/dL (ref 0–200)
HDL: 53 mg/dL (ref >39)
LDL CALC: 67 mg/dL (ref 0–99)
TRIGLYCERIDES: 103 mg/dL (ref <150)
VLDL: 21 mg/dL (ref 0–40)

## 2014-02-02 LAB — GLUCOSE, CAPILLARY
GLUCOSE-CAPILLARY: 98 mg/dL (ref 70–99)
Glucose-Capillary: 94 mg/dL (ref 70–99)
Glucose-Capillary: 86 mg/dL (ref 70–99)
Glucose-Capillary: 229 mg/dL — ABNORMAL HIGH (ref 70–99)
Glucose-Capillary: 154 mg/dL — ABNORMAL HIGH (ref 70–99)

## 2014-02-02 MED ORDER — INSULIN ASPART 100 UNIT/ML ~~LOC~~ SOLN: 0.0000 [IU] | Freq: Every day | SUBCUTANEOUS | Status: DC

## 2014-02-02 MED ORDER — IPRATROPIUM-ALBUTEROL 0.5-2.5 (3) MG/3ML IN SOLN: 3.0000 mL | Freq: Two times a day (BID) | RESPIRATORY_TRACT | Status: DC

## 2014-02-02 MED ORDER — IPRATROPIUM-ALBUTEROL 0.5-2.5 (3) MG/3ML IN SOLN
3.0000 mL | Freq: Two times a day (BID) | RESPIRATORY_TRACT | Status: DC
  Administered 2014-02-02 – 2014-02-03 (×2): 3 mL via RESPIRATORY_TRACT
  Filled 2014-02-02 (×2): qty 3

## 2014-02-02 MED ORDER — INSULIN ASPART 100 UNIT/ML ~~LOC~~ SOLN
0.0000 [IU] | Freq: Three times a day (TID) | SUBCUTANEOUS | Status: DC
  Administered 2014-02-03: 3 [IU] via SUBCUTANEOUS
  Administered 2014-02-03: 4 [IU] via SUBCUTANEOUS

## 2014-02-02 NOTE — Progress Notes (Signed)
  Echocardiogram 2D Echocardiogram has been performed.  Ann Sellers 02/02/2014, 10:15 AM

## 2014-02-02 NOTE — Progress Notes (Signed)
Utilization review completed. Shabrea Weldin, RN, BSN. 

## 2014-02-02 NOTE — Progress Notes (Signed)
EEG Completed; Results Pending  

## 2014-02-02 NOTE — Procedures (Signed)
History: 80 year old female with stuttering speech.   Sedation: None  Technique: This is a 17 channel routine scalp EEG performed at the bedside with bipolar and monopolar montages arranged in accordance to the international 10/20 system of electrode placement. One channel was dedicated to EKG recording.    Background: There is a well defined posterior dominant rhythm of 9.5 Hz that attenuates with eye opening.  The predominance of this EEG was recorded during sleep with high-voltage activity seen symmetrically. This activity completely aborted with arousal.  Photic stimulation: Physiologic driving is not performed  EEG Abnormalities: None  Clinical Interpretation: This normal EEG is recorded in the waking and sleep state. There was no seizure or seizure predisposition recorded on this study.   Roland Rack, MD [facility] [phone]  If 7pm- 7am, please page neurology on call as listed in [place].

## 2014-02-02 NOTE — Evaluation (Signed)
Clinical/Bedside Swallow Evaluation Patient Details  Name: Ann Sellers MRN: [id number] Date of Birth: 02/16/1925  Today's Date: 02/02/2014 Time: 1201-1244 SLP Time Calculation (min): 43 min  Past Medical History:  Past Medical History  Diagnosis Date  . B12 deficiency   . Asthma   . Diabetes mellitus   . Hypertension   . Arthritis   . Neuromuscular disorder   . Wears glasses   . Wears dentures     top denture-bottom partial  . Wears hearing aid     both ears  . Right rotator cuff tear    Past Surgical History:  Past Surgical History  Procedure Laterality Date  . Tonsillectomy  1936  . Appendectomy  1945  . Vesicovaginal fistula closure w/ tah  1968  . Herniated disc  1983  . Tympanostomy tube placement  1989  . Foot neuroma surgery  1994  . Left knee surgery  1998  . Esophageal stretch  2001  . Left knee replacement  2002  . Cervical stynosis  2005  . Right wrist fracture  2006  . Cataract extraction, bilateral  2008  . Left hand surgery  2011    gout  . Tonsillectomy    . Shoulder arthroscopy with rotator cuff repair Right 01/05/2013    Procedure: SHOULDER ARTHROSCOPY WITH ROTATOR CUFF REPAIR subchromial decompression and distal clavical excision;  Surgeon: Lorn Junes, MD;  Location: [place];  Service: Orthopedics;  Laterality: Right;   HPI:  80 yo female who resides with her son admitted to [facility] with stuttering speech.  Pt imaging studies negative for acute neuro event. Pt for bedside swallow evaluation due to h/o dysphagia - esophageal requiring dilatation a few years ago per pt.     Assessment / Plan / Recommendation Clinical Impression  Pt with negative cranial nerve exam - Able to self feed saltine crackers, applesauce, and orange juice with timely swallow and clear voice throughout.  Pt with cough at baseline and x2 during entire snack - presume not due to aspiration/airway compromise.  Adequate oropharyngeal swallow ability apparent.     Educated pt and 2 daughters to findings, recommendations for compensation strategies due to pt's COPD possibly impacting swallow/respiration coordination and known esophageal deficits.    Pt with stuttering type of verbal output intermittently that has improved since yesterday - she denies need for SLP intervention for this issue.  All education completed, SLP to sign off.      Aspiration Risk  Mild    Diet Recommendation Regular;Thin liquid   Liquid Administration via: Cup;Straw Medication Administration: Whole meds with liquid Supervision: Patient able to self feed Compensations: Slow rate;Small sips/bites (start meal with liquids) Postural Changes and/or Swallow Maneuvers: Seated upright 90 degrees;Upright 30-60 min after meal    Other  Recommendations Oral Care Recommendations: Oral care BID   Follow Up Recommendations  None    Frequency and Duration        Pertinent Vitals/Pain Low grade temp, decreased      Swallow Study Prior Functional Status   see [place] Date of Onset: 02/02/14 HPI: 80 yo female who resides with her son admitted to [facility] with stuttering speech.  Pt imaging studies negative for acute neuro event. Pt for bedside swallow evaluation due to h/o dysphagia - esophageal requiring dilatation a few years ago per pt.   Type of Study: Bedside swallow evaluation Diet Prior to this Study: NPO Temperature Spikes Noted: Yes Respiratory Status: Nasal cannula History  of Recent Intubation: No Behavior/Cognition: Alert;Cooperative;Pleasant mood Oral Cavity - Dentition: Adequate natural dentition;Dentures, top (lower partial at home) Self-Feeding Abilities: Able to feed self Patient Positioning: Upright in bed Baseline Vocal Quality: Clear Volitional Cough: Strong Volitional Swallow: Able to elicit    Oral/Motor/Sensory Function Overall Oral Motor/Sensory Function: Appears within functional limits for tasks assessed   Ice Chips Ice chips: Not tested    Thin Liquid Thin Liquid: Within functional limits Presentation: Self Fed;Cup;Straw    Nectar Thick Nectar Thick Liquid: Not tested   Honey Thick Honey Thick Liquid: Not tested   Puree Puree: Within functional limits Presentation: Self Fed;Spoon   Solid   GO    Solid: Within functional limits Presentation: Self Fed Other Comments: minimal oral stasis cleared with extra swallows       Claudie Fisherman, [facility] SLP [phone]

## 2014-02-02 NOTE — Progress Notes (Signed)
[facility] PROGRESS NOTE  Ann Sellers [id number] DOB: 12-19-1924 DOA: 02/01/2014 PCP: Antony Blackbird, MD  Assessment/Plan: Present on Admission:  . TIA (transient ischemic attack)  - Suspected dysarthria secondary to TIA vs menigioma. Initial CT of the head was concerning for a aneurysm, however MRI/MRA of the brain shows the lesion seen on the CT scan likely to be a meningioma. Patient has multiple risk factors for atherosclerotic disease, was already on aspirin as outpatient so switched over to Plavix. - Neuro recs noted. Plan for EEG. If neg, conservative mgt recommended.  . Diabetes mellitus  - Check A1c, continue Lantus, start SSI.   Marland Kitchen Hypertension  - Continue with losartan-patient has chronic kidney disease but seems to be on losartan chronically. Continue to monitor potassium and creatinine.   . CKD (chronic kidney disease), stage III  - Monitor electrolytes periodically   . Asthma  - No wheezing, patient claims she has no shortness of breath. Will start on nebulized bronchodilators    Code Status: Full Family Communication: Pt in room (indicate person spoken with, relationship, and if by phone, the number) Disposition Plan: Pending   Consultants:  Neurology  Procedures:  EEG  Antibiotics:   (indicate start date, and stop date if known)  HPI/Subjective: Reports still stuttering. No other complaints  Objective: Filed Vitals:   02/02/14 0010 02/02/14 0215 02/02/14 0606 02/02/14 0852  BP: 117/90 143/44 117/45   Pulse: 100 98 110   Temp: 98.4 F (36.9 C) 100.8 F (38.2 C) 99.2 F (37.3 C)   TempSrc: Oral Oral Oral   Resp: 20 20 20    Height:      Weight:      SpO2: 97% 96% 100% 96%   No intake or output data in the 24 hours ending 02/02/14 1127 Filed Weights   02/01/14 1948  Weight: 89.994 kg (198 lb 6.4 oz)    Exam:   General:  Awake, in nad  Cardiovascular: regular, s1, s2  Respiratory: normal resp effort, no wheezing  Abdomen:  soft, nondistended  Musculoskeletal: perfused, no clubbing   Data Reviewed: Basic Metabolic Panel:  Recent Labs Lab 02/01/14 1428 02/01/14 1451  NA 141 140  K 5.8* 5.4*  CL 105 108  CO2 20  --   GLUCOSE 101* 98  BUN 65* 61*  CREATININE 2.56* 2.80*  CALCIUM 7.7*  --    Liver Function Tests:  Recent Labs Lab 02/01/14 1428  AST 11  ALT 9  ALKPHOS 101  BILITOT 0.4  PROT 6.0  ALBUMIN 3.2*   No results found for this basename: LIPASE, AMYLASE,  in the last 168 hours No results found for this basename: AMMONIA,  in the last 168 hours CBC:  Recent Labs Lab 02/01/14 1428 02/01/14 1451  WBC 6.4  --   NEUTROABS 4.8  --   HGB 9.9* 10.5*  HCT 30.6* 31.0*  MCV 93.0  --   PLT 200  --    Cardiac Enzymes: No results found for this basename: CKTOTAL, CKMB, CKMBINDEX, TROPONINI,  in the last 168 hours BNP (last 3 results) No results found for this basename: PROBNP,  in the last 8760 hours CBG:  Recent Labs Lab 02/01/14 2159 02/02/14 0002 02/02/14 0501  GLUCAP 85 98 94    No results found for this or any previous visit (from the past 240 hour(s)).   Studies: Ct Head Wo Contrast  02/01/2014   CLINICAL DATA:  Code stroke, slurred speech.  EXAM: CT HEAD WITHOUT CONTRAST  TECHNIQUE: Contiguous axial images were obtained from the base of the skull through the vertex without intravenous contrast.  COMPARISON:  None.  FINDINGS: There is a 2.1 cm rounded hyperdense structure noted in the suprasellar cistern with scattered peripheral calcifications. 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 Differential considerations would also include meningioma, less likely pituitary mass as this does not appear to extend into the sella turcica. No hemorrhage or hydrocephalus. No acute infarction. Scattered chronic microvascular changes in the deep white matter.  No acute calvarial abnormality. Visualized paranasal sinuses and mastoids clear. Orbital  soft tissues unremarkable.  IMPRESSION: 2.1 cm rounded hyperdense structure anteriorly and the suprasellar cistern concerning for possible A-comm aneurysm. Differential considerations would include meningioma. Recommend MRI and MRA with and without contrast.   Electronically Signed   By: Rolm Baptise M.D.   On: 02/01/2014 14:57   Mr Ann Sellers Wo Contrast  02/01/2014   CLINICAL DATA:  80 year old female presenting with code stroke, slurred speech. Central skullbase or brain mass on noncontrast head CT today for further evaluation. Renal insufficiency precludes IV contrast at this time. Initial encounter.  EXAM: MRI HEAD WITHOUT CONTRAST  MRA HEAD WITHOUT CONTRAST  TECHNIQUE: Multiplanar, multiecho pulse sequences of the brain and surrounding structures were obtained without intravenous contrast. Angiographic images of the head were obtained using MRA technique without contrast.  COMPARISON:  Non contrast head CT 1446 hr the same day.  FINDINGS: MRI HEAD FINDINGS  The CT hyperdense CT lesion corresponds to an extra-axial mass arising from the sphenoid planum (series 5, image 12) encompassing 22 x 19 x 18 mm. This does mildly splayed the carotid termini and ACAs, but is without pulsation artifact or internal heterogeneity, being homogeneously isointense to gray matter on T1 and T2 sequences. The also MRA findings below. Minimal mass effect on the inferior frontal gyri with no associated cerebral edema. The mass also laterally displaces the proximal optic nerves. The suprasellar cistern remains patent. The pituitary infundibulum and gland do not appear affected.  No restricted diffusion or evidence of acute infarction. Major intracranial vascular flow voids are preserved.  No midline shift, ventriculomegaly, extra-axial collection or acute intracranial hemorrhage. Cervicomedullary junction within normal limits. Susceptibility artifact in the cervical spine compatible with prior surgery. Minimal to mild for age patchy  nonspecific cerebral white matter T2 and FLAIR hyperintensity. No cortical encephalomalacia. There are occasional chronic micro hemorrhages in the brain (right posterior temporal lobe series 10, image 124). Cerebral volume is within normal limits for age.  Normal bone marrow signal. Postoperative changes to the globes. Minor paranasal sinus mucosal thickening. Visible internal auditory structures appear normal. Mastoids are clear. Negative scalp soft tissues.  MRA HEAD FINDINGS  Antegrade flow in the posterior circulation with codominant distal vertebral arteries. Normal PICA origins. Normal vertebrobasilar junction. Normal right AICA origin. No basilar stenosis. SCA and PCA origins are normal. Normal posterior communicating arteries. Normal bilateral PCA branches.  Antegrade flow in both ICA siphons. Tortuous distal cervical ICAs. Tortuous cavernous ICA is. Normal ophthalmic and posterior communicating artery origins.  Normal carotid termini. Normal MCA and ACA origins. Normal A1 segments and anterior communicating artery (series 6, image 105). The central skullbase mass is devoid of any flow signal (series 6, image 96). Visible bilateral ACA branches are within normal limits. Visible bilateral MCA branches are within normal limits.  IMPRESSION: 1. IV contrast could not be utilized due to renal insufficiency, but the hyperdense central skullbase mass seen on CT has  non contrast MRI and MRA characteristics consistent with sphenoid planum meningioma. No associated cerebral edema. Mild regional mass effect. 2. No acute infarct or acute intracranial abnormality identified. 3. Negative for age intracranial MRA.   Electronically Signed   By: Lars Pinks M.D.   On: 02/01/2014 17:22   Mr Brain Wo Contrast  02/01/2014   CLINICAL DATA:  80 year old female presenting with code stroke, slurred speech. Central skullbase or brain mass on noncontrast head CT today for further evaluation. Renal insufficiency precludes IV contrast  at this time. Initial encounter.  EXAM: MRI HEAD WITHOUT CONTRAST  MRA HEAD WITHOUT CONTRAST  TECHNIQUE: Multiplanar, multiecho pulse sequences of the brain and surrounding structures were obtained without intravenous contrast. Angiographic images of the head were obtained using MRA technique without contrast.  COMPARISON:  Non contrast head CT 1446 hr the same day.  FINDINGS: MRI HEAD FINDINGS  The CT hyperdense CT lesion corresponds to an extra-axial mass arising from the sphenoid planum (series 5, image 12) encompassing 22 x 19 x 18 mm. This does mildly splayed the carotid termini and ACAs, but is without pulsation artifact or internal heterogeneity, being homogeneously isointense to gray matter on T1 and T2 sequences. The also MRA findings below. Minimal mass effect on the inferior frontal gyri with no associated cerebral edema. The mass also laterally displaces the proximal optic nerves. The suprasellar cistern remains patent. The pituitary infundibulum and gland do not appear affected.  No restricted diffusion or evidence of acute infarction. Major intracranial vascular flow voids are preserved.  No midline shift, ventriculomegaly, extra-axial collection or acute intracranial hemorrhage. Cervicomedullary junction within normal limits. Susceptibility artifact in the cervical spine compatible with prior surgery. Minimal to mild for age patchy nonspecific cerebral white matter T2 and FLAIR hyperintensity. No cortical encephalomalacia. There are occasional chronic micro hemorrhages in the brain (right posterior temporal lobe series 10, image 124). Cerebral volume is within normal limits for age.  Normal bone marrow signal. Postoperative changes to the globes. Minor paranasal sinus mucosal thickening. Visible internal auditory structures appear normal. Mastoids are clear. Negative scalp soft tissues.  MRA HEAD FINDINGS  Antegrade flow in the posterior circulation with codominant distal vertebral arteries. Normal  PICA origins. Normal vertebrobasilar junction. Normal right AICA origin. No basilar stenosis. SCA and PCA origins are normal. Normal posterior communicating arteries. Normal bilateral PCA branches.  Antegrade flow in both ICA siphons. Tortuous distal cervical ICAs. Tortuous cavernous ICA is. Normal ophthalmic and posterior communicating artery origins.  Normal carotid termini. Normal MCA and ACA origins. Normal A1 segments and anterior communicating artery (series 6, image 105). The central skullbase mass is devoid of any flow signal (series 6, image 96). Visible bilateral ACA branches are within normal limits. Visible bilateral MCA branches are within normal limits.  IMPRESSION: 1. IV contrast could not be utilized due to renal insufficiency, but the hyperdense central skullbase mass seen on CT has non contrast MRI and MRA characteristics consistent with sphenoid planum meningioma. No associated cerebral edema. Mild regional mass effect. 2. No acute infarct or acute intracranial abnormality identified. 3. Negative for age intracranial MRA.   Electronically Signed   By: Lars Pinks M.D.   On: 02/01/2014 17:22    Scheduled Meds: . allopurinol  100 mg Oral Daily  . clopidogrel  75 mg Oral Q breakfast  . [START ON 02/07/2014] cyanocobalamin  1,000 mcg Intramuscular Q30 days  . fluticasone  1 spray Each Nare Daily  . furosemide  20 mg Oral Daily  .  gabapentin  100 mg Oral TID  . heparin  5,000 Units Subcutaneous 3 times per day  . insulin glargine  20 Units Subcutaneous QHS  . ipratropium-albuterol  3 mL Nebulization TID  . losartan  50 mg Oral Daily   Continuous Infusions:   Principal Problem:   TIA (transient ischemic attack) Active Problems:   Asthma   Hypertension   Diabetes mellitus   CKD (chronic kidney disease), stage III  Time spent: 63min  Stephen K Chiu  [facility] Pager [phone]. If 7PM-7AM, please contact night-coverage at [email or web address], password [facility] 02/02/2014, 11:27 AM   LOS: 1 day

## 2014-02-02 NOTE — Progress Notes (Signed)
VASCULAR LAB PRELIMINARY  PRELIMINARY  PRELIMINARY  PRELIMINARY  Carotid duplex  completed.    Preliminary report:  Bilateral:  1-39% ICA stenosis.  Vertebral artery flow is antegrade.      Nani Ravens, RVT 02/02/2014, 10:37 AM

## 2014-02-02 NOTE — Progress Notes (Signed)
Subjective: No acute changes overnight.   Exam: Filed Vitals:   02/02/14 0606  BP: 117/45  Pulse: 110  Temp: 99.2 F (37.3 C)  Resp: 20   Gen: In bed, NAD MS: Awake, alert, oriented. Stuttering speech, but is able to follow commands well. When asked direct questions about tangential subjects, she responds quickly without stutter, but when asked about her current presentation or family matters, she begins stuttering.  [id number], EOMI Motor: MAEW Sensory:intact to LT  Labs:  FLP, normal, LDL 67 A1C 7.7   Impression: 80 yo F with stuttering speech. MRI shows what is likely a meningioma. It is unclear what this episode represents, but with meningioma, seizure could be considered.   Acute stuttering is rarely physiologic, and if EEG is negative, then I would favor conservative treatment.   Recommendations: 1) EEG 2) If EEG is negative, no further recommendations.   Roland Rack, MD [facility] [phone]  If 7pm- 7am, please page neurology on call as listed in [place].

## 2014-02-03 LAB — GLUCOSE, CAPILLARY
GLUCOSE-CAPILLARY: 146 mg/dL — AB (ref 70–99)
Glucose-Capillary: 153 mg/dL — ABNORMAL HIGH (ref 70–99)

## 2014-02-03 MED ORDER — ALPRAZOLAM 0.25 MG PO TABS: 0.2500 mg | ORAL_TABLET | Freq: Two times a day (BID) | ORAL | Status: DC | PRN

## 2014-02-03 MED ORDER — ONDANSETRON HCL 4 MG/2ML IJ SOLN
4.0000 mg | Freq: Four times a day (QID) | INTRAMUSCULAR | Status: DC | PRN
  Filled 2014-02-03: qty 2

## 2014-02-03 NOTE — Progress Notes (Signed)
Talked to patient with family members present about DCP/ [place] choices, patient requested [facility] for home health care services; Mary with [facility] called for arrangements; Also patient has a rolater at home that she cannot use because it is not the proper size for the patient; Mary with [facility] called to have someone talk to her and work on getting a rolater that will fit the patientAneta Sellers [id number]

## 2014-02-03 NOTE — Evaluation (Signed)
Occupational Therapy Evaluation Patient Details Name: CATALEYA BRAGAN MRN: [id number] DOB: [birth year] Today's Date: 02/03/2014    History of Present Illness 80 yo F with stuttering speech. MRI shows what is likely a meningioma. It is unclear what this episode represents, but with meningioma, seizure could be considered.  TIA in setting of meningioma possible.   Clinical Impression   Pt admitted with above. She demonstrates the below listed deficits and will benefit from continued OT to maximize safety and independence with BADLs.  Pt performs BADLs at min guard to min A level.  She had an episode of dizziness and stuttering during eval.  Pt with provoked nystagmus with gaze stabilization.  She would benefit from vestibular evaluation with HHOT or PT.  Pt for discharge home.      Follow Up Recommendations  Home health OT;Supervision/Assistance - 24 hour    Equipment Recommendations  None recommended by OT    Recommendations for Other Services       Precautions / Restrictions Precautions Precautions: Fall Restrictions Weight Bearing Restrictions: No      Mobility Bed Mobility Overal bed mobility: Modified Independent             General bed mobility comments: significantly increased time to perform, no physical assist provided, patient moving in tiny increments  Transfers Overall transfer level: Needs assistance Equipment used: Rolling walker (2 wheeled) Transfers: Sit to/from [facility] Sit to Stand: Min guard         General transfer comment: min guard for balance    Balance                                            ADL Overall ADL's : Needs assistance/impaired Eating/Feeding: Independent;Sitting   Grooming: Wash/dry hands;Wash/dry face;Brushing hair;Standing (min guard assist)   Upper Body Bathing: Set up;Sitting   Lower Body Bathing: Min guard;Sit to/from stand   Upper Body Dressing : Set up;Sitting   Lower Body  Dressing: Min guard;Sit to/from stand   Toilet Transfer: Minimal assistance;Ambulation;Comfort height toilet Toilet Transfer Details (indicate cue type and reason): Pt with complaint of dizziness and feeling like she was going to fall and was assisted to sitting in chair Toileting- Clothing Manipulation and Hygiene: Min guard;Sit to/from stand         General ADL Comments: Pt instructed to sit to shower that she needs assistance when she is up ambulating, that she should not attempt to go up and down stairs at home, nor do laundry until cleared by [employer] Alignment: Within Functional Limits Alignment/Gaze Preference: Within Defined Limits Ocular Range of Motion: Within Functional Limits           Additional Comments: Pt with questionable horizontal nystagmus noted after dizzy episode.   performed gaze stabilization and pt with horizontal nystagmus x 2, but denied dizziness   Perception     Praxis      Pertinent Vitals/Pain No complaint     Hand Dominance Right   Extremity/Trunk Assessment Upper Extremity Assessment Upper Extremity Assessment: Overall WFL for tasks assessed   Lower Extremity Assessment Lower Extremity Assessment: Defer to PT evaluation RLE Deficits / Details: pain in right foot during activity, gross strength 3+/5 (+ arthritic changes) RLE: Unable to fully assess due to pain RLE Sensation: history of peripheral neuropathy LLE Deficits / Details: large hematoma leteral lower  leg, very painful to touch LLE Sensation: history of peripheral neuropathy   Cervical / Trunk Assessment Cervical / Trunk Assessment:  (increased body habitus)   Communication Communication Communication: HOH   Cognition Arousal/Alertness: Awake/alert Behavior During Therapy: Anxious;Flat affect Overall Cognitive Status: Impaired/Different from baseline Area of Impairment: Safety/judgement;Problem solving         Safety/Judgement: Decreased awareness of safety    Problem Solving: Requires verbal cues     General Comments       Exercises       Shoulder Instructions      Home Living Family/patient expects to be discharged to:: Private residence Living Arrangements: Children Available Help at Discharge: Family Type of Home: House Home Access: Stairs to enter Technical brewer of Steps: 3 Entrance Stairs-Rails: Can reach both Home Layout: One level     Bathroom Shower/Tub: Occupational psychologist: Standard Bathroom Accessibility: Yes How Accessible: Accessible via walker Home Equipment: [place] - 2 wheels;Bedside commode;Wheelchair - manual;Grab bars - tub/shower;Adaptive equipment Adaptive Equipment: Sock aid        Prior Functioning/Environment Level of Independence: Independent with assistive device(s)        Comments: Pt endorses multiple falls at home     OT Diagnosis: Generalized weakness;Other (comment) (questionable vestibular deficits)   OT Problem List: Decreased strength;Impaired balance (sitting and/or standing);Decreased knowledge of use of DME or AE;Decreased activity tolerance;Other (comment) (questionable vestibular deficit)   OT Treatment/Interventions:      OT Goals(Current goals can be found in the care plan section) Acute Rehab OT Goals Patient Stated Goal: to go home  OT Frequency:     Barriers to D/C:            Co-evaluation              End of Session Equipment Utilized During Treatment: Rolling walker Nurse Communication: Mobility status  Activity Tolerance: Other (comment) (dizziness) Patient left: in chair;with call bell/phone within reach;with family/visitor present   Time: [id number] OT Time Calculation (min): 28 min Charges:  OT General Charges $OT Visit: 1 Procedure OT Evaluation $Initial OT Evaluation Tier I: 1 Procedure OT Treatments $Therapeutic Activity: 8-22 mins G-Codes:    Harl Wiechmann M Trey Bebee Feb 21, 2014, 5:21 PM

## 2014-02-03 NOTE — Evaluation (Signed)
Physical Therapy Evaluation Patient Details Name: Ann Sellers MRN: [id number] DOB: Nov 19, 1924 Today's Date: 02/03/2014   History of Present Illness  80 yo F with stuttering speech. MRI shows what is likely a meningioma. It is unclear what this episode represents, but with meningioma, seizure could be considered.  TIA in setting of meningioma possible.  Clinical Impression  Patient demonstrates deficits in mobility as indicated below. Patient will benefit from continued skilled PT to address deficits and maximize function. Will see as indicated and progress as tolerated. Recommend HHPT for mobility, safety, and in home assessment.  (OF NOTE: please request HHPT evaluate sizing of assistive equipment)    Follow Up Recommendations Home health PT;Supervision for mobility/OOB    Equipment Recommendations       Recommendations for Other Services       Precautions / Restrictions Precautions Precautions: Fall Restrictions Weight Bearing Restrictions: No      Mobility  Bed Mobility Overal bed mobility: Modified Independent             General bed mobility comments: significantly increased time to perform, no physical assist provided, patient moving in tiny increments  Transfers Overall transfer level: Needs assistance Equipment used: Rolling walker (2 wheeled) Transfers: Sit to/from Stand Sit to Stand: Min guard;Min assist         General transfer comment: various surfaces, VCs for safety and hand placement, increased time to perform, VCs for safety. Assist to elevate to standing without device, min guard with use of RW from bed and from toilet  Ambulation/Gait Ambulation/Gait assistance: Min guard Ambulation Distance (Feet): 34 Feet Assistive device: Rolling walker (2 wheeled) Gait Pattern/deviations: Shuffle;Decreased stride length;Step-to pattern;Antalgic;Trunk flexed;Wide base of support (emerging step thru) Gait velocity: decreased Gait velocity interpretation:  <1.8 ft/sec, indicative of risk for recurrent falls General Gait Details: VCs for position withing RW, assist for pacing  Stairs            Wheelchair Mobility    Modified Rankin (Stroke Patients Only)       Balance                                             Pertinent Vitals/Pain Reports pain in R foot during ambulation as well as pain from arthritic changes     Home Living Family/patient expects to be discharged to:: Private residence Living Arrangements: Children Available Help at Discharge: Family Type of Home: House Home Access: Stairs to enter Entrance Stairs-Rails: Can reach both Entrance Stairs-Number of Steps: 3 Home Layout: One level Home Equipment: Walker - 2 wheels;Bedside commode;Wheelchair - manual      Prior Function Level of Independence: Independent with assistive device(s)               Hand Dominance   Dominant Hand: Right    Extremity/Trunk Assessment               Lower Extremity Assessment: Generalized weakness;RLE deficits/detail;LLE deficits/detail RLE Deficits / Details: pain in right foot during activity, gross strength 3+/5 (+ arthritic changes) LLE Deficits / Details: large hematoma leteral lower leg, very painful to touch  Cervical / Trunk Assessment:  (increased body habitus)  Communication   Communication: HOH  Cognition Arousal/Alertness: Awake/alert Behavior During Therapy: Anxious;Flat affect Overall Cognitive Status: Impaired/Different from baseline Area of Impairment: Safety/judgement;Problem solving         Safety/Judgement: Decreased awareness  of safety   Problem Solving: Requires verbal cues      General Comments      Exercises        Assessment/Plan    PT Assessment Patient needs continued PT services  PT Diagnosis Difficulty walking;Abnormality of gait;Generalized weakness   PT Problem List Decreased strength;Decreased range of motion;Decreased activity  tolerance;Decreased balance;Decreased mobility;Decreased safety awareness;Pain  PT Treatment Interventions DME instruction;Gait training;Stair training;Functional mobility training;Therapeutic activities;Therapeutic exercise;Balance training;Patient/family education   PT Goals (Current goals can be found in the Care Plan section) Acute Rehab PT Goals Patient Stated Goal: to go home PT Goal Formulation: With patient/family Time For Goal Achievement: 02/17/14 Potential to Achieve Goals: Fair    Frequency Min 3X/week   Barriers to discharge        Co-evaluation               End of Session Equipment Utilized During Treatment: Gait belt Activity Tolerance: Patient limited by fatigue;Patient limited by pain Patient left: in chair;with call bell/phone within reach;with family/visitor present Nurse Communication: Mobility status         Time: 1459-1526 PT Time Calculation (min): 27 min   Charges:   PT Evaluation $Initial PT Evaluation Tier I: 1 Procedure PT Treatments $Gait Training: 8-22 mins $Self Care/Home Management: 8-22   PT G Codes:          Duncan Dull 02/03/2014, 3:55 PM Alben Deeds, [place] DPT  [phone]

## 2014-02-03 NOTE — Discharge Summary (Signed)
Physician Discharge Summary  Ann Sellers [id number] DOB: 07-17-1925 DOA: 02/01/2014  PCP: Antony Blackbird, MD  Admit date: 02/01/2014 Discharge date: 02/03/2014  Time spent: 35 minutes  Recommendations for Outpatient Follow-up:  1. Follow up with PCP in 1-2 weeks  Discharge Diagnoses:  Principal Problem:   TIA (transient ischemic attack) Active Problems:   Asthma   Hypertension   Diabetes mellitus   CKD (chronic kidney disease), stage III   Discharge Condition: Stable  Diet recommendation: Diabetic  Filed Weights   02/01/14 1948  Weight: 89.994 kg (198 lb 6.4 oz)    History of present illness:  Ann Sellers is a 80 y.o. female with a Past Medical History of one kidney disease stage III, diabetes, hypertension, bronchial asthma who presents today with the above noted complaint. Approximately around 10:00 AM, patient's son noted patient to have slurred speech and generalized weakness. Patient describes the speech difficulty as expressing words, but son also claims that she was having slurring of speech. Patient did not have any focal deficits. Patient was then brought to the ED for concern of stroke. CT of the head showed no acute stroke, but there was a 2.1cm rounded hyperdense structure anteriorly and the suprasellar cistern concerning for possible A-comm aneurysm was noted. He was consulted, patient was apparently 5 hours from initial presentation, and as a result TPA was not given. 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 In the ED, patient speaks spontaneously improved, she was thought to have a TIA, I was subsequently asked to admit this patient for further evaluation and treatment.  There is no history of fever, headache, nausea, vomiting diarrhea. There is a history of abdominal pain.  No dysuria as well.  Hospital Course:   . Stuttering Speech  - MRI/MRA of the brain shows the lesion seen on the CT scan likely to be a meningioma. Patient has multiple risk factors for atherosclerotic disease, was already on aspirin as outpatient so was initially switched over to Plavix.  - Neurology was consulted - EEG was unremarkable - On further questioning, the patient is learned to have MUCH recent life stressors, including a sister who is currently hospitalized for metastatic cancer, a child family member who recently had major thoracic surgery, and anniversary of pt's husband's death - Patient will be given a trial of PRN xanax on discharge  . Diabetes mellitus  - Check A1c, continue Lantus  . Hypertension  - Continued with losartan-patient has chronic kidney disease but seems to be on losartan chronically. Continue to monitor potassium and creatinine.   . CKD (chronic kidney disease), stage III  - Monitor electrolytes periodically   . Asthma  - No wheezing, patient claims she has no shortness of breath. Will start on nebulized bronchodilators   LE Pain s/p fall - Hematoma on exam - PT/OT recommends home therapy - have ordered   Procedures: EEG - Normal  Consultations:  Neurology  Discharge Exam: Filed Vitals:   02/03/14 0112 02/03/14 0552 02/03/14 1015 02/03/14 1112  BP: 141/56 157/63 150/55 130/58  Pulse: 85 88 92   Temp: 97.7 F (36.5 C) 98.3 F (36.8 C) 97.9 F (36.6 C)   TempSrc: Oral Oral Oral   Resp: 20 18 18    Height:      Weight:      SpO2:  95% 100% 96%     General: Awake, in nad Cardiovascular: regular, s1, s2 Respiratory: normal resp effort, no wheezing  Discharge Instructions     Medication List         albuterol 108 (90 BASE) MCG/ACT inhaler  Commonly known as:  PROVENTIL HFA;VENTOLIN HFA  Inhale 2 puffs into the lungs every 6 (six) hours as needed.     albuterol (2.5 MG/3ML) 0.083% nebulizer solution  Commonly known as:   PROVENTIL  Take 2.5 mg by nebulization 3 (three) times daily as needed.     alendronate 70 MG tablet  Commonly known as:  FOSAMAX  Take 70 mg by mouth every Sunday. Take with a full glass of water on an empty stomach.     allopurinol 100 MG tablet  Commonly known as:  ZYLOPRIM  Take 100 mg by mouth daily.     ALPRAZolam 0.25 MG tablet  Commonly known as:  XANAX  Take 1 tablet (0.25 mg total) by mouth 2 (two) times daily as needed for anxiety.     ALPRAZolam 0.25 MG tablet  Commonly known as:  XANAX  Take 1 tablet (0.25 mg total) by mouth 2 (two) times daily as needed for anxiety.     aspirin 81 MG tablet  Take 81 mg by mouth daily.     CALCIUM PLUS VITAMIN D PO  Take 1 tablet by mouth daily.     CHLOR-TRIMETON 4 MG tablet  Generic drug:  chlorpheniramine  Take 8 mg by mouth at bedtime. 2 by mouth at bedtime     colchicine 0.6 MG tablet  Take 0.6 mg by mouth daily as needed (for gout flareups).     cyanocobalamin 1000 MCG/ML injection  Commonly known as:  (VITAMIN B-12)  Inject 1,000 mcg into the muscle every 30 (thirty) days.     diphenoxylate-atropine 2.5-0.025 MG per tablet  Commonly known as:  LOMOTIL  Take 1 tablet by mouth daily as needed for diarrhea or loose stools.     furosemide 20 MG tablet  Commonly known as:  LASIX  Take 20 mg by mouth daily.     gabapentin 100 MG capsule  Commonly known as:  NEURONTIN  Take 100 mg by mouth 3 (three) times daily.     insulin glargine 100 UNIT/ML injection  Commonly known as:  LANTUS  Inject 20 Units into the skin at bedtime.     losartan 50 MG tablet  Commonly known as:  COZAAR  Take 50 mg by mouth daily.     mometasone 50 MCG/ACT nasal spray  Commonly known as:  NASONEX  Place 2 sprays into the nose daily.     sitaGLIPtin 100 MG tablet  Commonly known as:  JANUVIA  Take 100 mg by mouth daily.     triamcinolone cream 0.1 %  Commonly known as:  KENALOG  Apply 1 application topically 2 (two) times daily.      WOMENS DAILY FORMULA PO  Take 1 tablet by mouth daily.       Allergies  Allergen Reactions  . Snake Antivenin [Antivenin Crotalidae Polyvalent (Snake Antivenin)] Other (See Comments)    unknown   Follow-up Information   Follow up with FULP, CAMMIE, MD. Schedule an appointment as soon as possible for a visit in 1 week.   Specialty:  Family Medicine   Contact information:   [address] [place] [address] [phone]        The results of significant diagnostics  from this hospitalization (including imaging, microbiology, ancillary and laboratory) are listed below for reference.    Significant Diagnostic Studies: Ct Head Wo Contrast  02/01/2014   CLINICAL DATA:  Code stroke, slurred speech.  EXAM: CT HEAD WITHOUT CONTRAST  TECHNIQUE: Contiguous axial images were obtained from the base of the skull through the vertex without intravenous contrast.  COMPARISON:  None.  FINDINGS: There is a 2.1 cm rounded hyperdense structure noted in the suprasellar cistern with scattered peripheral calcifications. 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 Differential considerations would also include meningioma, less likely pituitary mass as this does not appear to extend into the sella turcica. No hemorrhage or hydrocephalus. No acute infarction. Scattered chronic microvascular changes in the deep white matter.  No acute calvarial abnormality. Visualized paranasal sinuses and mastoids clear. Orbital soft tissues unremarkable.  IMPRESSION: 2.1 cm rounded hyperdense structure anteriorly and the suprasellar cistern concerning for possible A-comm aneurysm. Differential considerations would include meningioma. Recommend MRI and MRA with and without contrast.   Electronically Signed   By: Rolm Baptise M.D.   On: 02/01/2014 14:57   [name] Wo Contrast  02/01/2014   CLINICAL DATA:  80 year old female presenting with code stroke, slurred speech.  Central skullbase or brain mass on noncontrast head CT today for further evaluation. Renal insufficiency precludes IV contrast at this time. Initial encounter.  EXAM: MRI HEAD WITHOUT CONTRAST  MRA HEAD WITHOUT CONTRAST  TECHNIQUE: Multiplanar, multiecho pulse sequences of the brain and surrounding structures were obtained without intravenous contrast. Angiographic images of the head were obtained using MRA technique without contrast.  COMPARISON:  Non contrast head CT 1446 hr the same day.  FINDINGS: MRI HEAD FINDINGS  The CT hyperdense CT lesion corresponds to an extra-axial mass arising from the sphenoid planum (series 5, image 12) encompassing 22 x 19 x 18 mm. This does mildly splayed the carotid termini and ACAs, but is without pulsation artifact or internal heterogeneity, being homogeneously isointense to gray matter on T1 and T2 sequences. The also MRA findings below. Minimal mass effect on the inferior frontal gyri with no associated cerebral edema. The mass also laterally displaces the proximal optic nerves. The suprasellar cistern remains patent. The pituitary infundibulum and gland do not appear affected.  No restricted diffusion or evidence of acute infarction. Major intracranial vascular flow voids are preserved.  No midline shift, ventriculomegaly, extra-axial collection or acute intracranial hemorrhage. Cervicomedullary junction within normal limits. Susceptibility artifact in the cervical spine compatible with prior surgery. Minimal to mild for age patchy nonspecific cerebral white matter T2 and FLAIR hyperintensity. No cortical encephalomalacia. There are occasional chronic micro hemorrhages in the brain (right posterior temporal lobe series 10, image 124). Cerebral volume is within normal limits for age.  Normal bone marrow signal. Postoperative changes to the globes. Minor paranasal sinus mucosal thickening. Visible internal auditory structures appear normal. Mastoids are clear. Negative scalp  soft tissues.  MRA HEAD FINDINGS  Antegrade flow in the posterior circulation with codominant distal vertebral arteries. Normal PICA origins. Normal vertebrobasilar junction. Normal right AICA origin. No basilar stenosis. SCA and PCA origins are normal. Normal posterior communicating arteries. Normal bilateral PCA branches.  Antegrade flow in both ICA siphons. Tortuous distal cervical ICAs. Tortuous cavernous ICA is. Normal ophthalmic and posterior communicating artery origins.  Normal carotid termini. Normal MCA and ACA origins. Normal A1 segments and anterior communicating artery (series 6, image 105). The central skullbase mass is devoid of any flow signal (series  6, image 96). Visible bilateral ACA branches are within normal limits. Visible bilateral MCA branches are within normal limits.  IMPRESSION: 1. IV contrast could not be utilized due to renal insufficiency, but the hyperdense central skullbase mass seen on CT has non contrast MRI and MRA characteristics consistent with sphenoid planum meningioma. No associated cerebral edema. Mild regional mass effect. 2. No acute infarct or acute intracranial abnormality identified. 3. Negative for age intracranial MRA.   Electronically Signed   By: Lars Pinks M.D.   On: 02/01/2014 17:22   Mr Brain Wo Contrast  02/01/2014   CLINICAL DATA:  80 year old female presenting with code stroke, slurred speech. Central skullbase or brain mass on noncontrast head CT today for further evaluation. Renal insufficiency precludes IV contrast at this time. Initial encounter.  EXAM: MRI HEAD WITHOUT CONTRAST  MRA HEAD WITHOUT CONTRAST  TECHNIQUE: Multiplanar, multiecho pulse sequences of the brain and surrounding structures were obtained without intravenous contrast. Angiographic images of the head were obtained using MRA technique without contrast.  COMPARISON:  Non contrast head CT 1446 hr the same day.  FINDINGS: MRI HEAD FINDINGS  The CT hyperdense CT lesion corresponds to an  extra-axial mass arising from the sphenoid planum (series 5, image 12) encompassing 22 x 19 x 18 mm. This does mildly splayed the carotid termini and ACAs, but is without pulsation artifact or internal heterogeneity, being homogeneously isointense to gray matter on T1 and T2 sequences. The also MRA findings below. Minimal mass effect on the inferior frontal gyri with no associated cerebral edema. The mass also laterally displaces the proximal optic nerves. The suprasellar cistern remains patent. The pituitary infundibulum and gland do not appear affected.  No restricted diffusion or evidence of acute infarction. Major intracranial vascular flow voids are preserved.  No midline shift, ventriculomegaly, extra-axial collection or acute intracranial hemorrhage. Cervicomedullary junction within normal limits. Susceptibility artifact in the cervical spine compatible with prior surgery. Minimal to mild for age patchy nonspecific cerebral white matter T2 and FLAIR hyperintensity. No cortical encephalomalacia. There are occasional chronic micro hemorrhages in the brain (right posterior temporal lobe series 10, image 124). Cerebral volume is within normal limits for age.  Normal bone marrow signal. Postoperative changes to the globes. Minor paranasal sinus mucosal thickening. Visible internal auditory structures appear normal. Mastoids are clear. Negative scalp soft tissues.  MRA HEAD FINDINGS  Antegrade flow in the posterior circulation with codominant distal vertebral arteries. Normal PICA origins. Normal vertebrobasilar junction. Normal right AICA origin. No basilar stenosis. SCA and PCA origins are normal. Normal posterior communicating arteries. Normal bilateral PCA branches.  Antegrade flow in both ICA siphons. Tortuous distal cervical ICAs. Tortuous cavernous ICA is. Normal ophthalmic and posterior communicating artery origins.  Normal carotid termini. Normal MCA and ACA origins. Normal A1 segments and anterior  communicating artery (series 6, image 105). The central skullbase mass is devoid of any flow signal (series 6, image 96). Visible bilateral ACA branches are within normal limits. Visible bilateral MCA branches are within normal limits.  IMPRESSION: 1. IV contrast could not be utilized due to renal insufficiency, but the hyperdense central skullbase mass seen on CT has non contrast MRI and MRA characteristics consistent with sphenoid planum meningioma. No associated cerebral edema. Mild regional mass effect. 2. No acute infarct or acute intracranial abnormality identified. 3. Negative for age intracranial MRA.   Electronically Signed   By: Lars Pinks M.D.   On: 02/01/2014 17:22    Microbiology: No results found for this  or any previous visit (from the past 240 hour(s)).   Labs: Basic Metabolic Panel:  Recent Labs Lab 02/01/14 1428 02/01/14 1451  NA 141 140  K 5.8* 5.4*  CL 105 108  CO2 20  --   GLUCOSE 101* 98  BUN 65* 61*  CREATININE 2.56* 2.80*  CALCIUM 7.7*  --    Liver Function Tests:  Recent Labs Lab 02/01/14 1428  AST 11  ALT 9  ALKPHOS 101  BILITOT 0.4  PROT 6.0  ALBUMIN 3.2*   No results found for this basename: LIPASE, AMYLASE,  in the last 168 hours No results found for this basename: AMMONIA,  in the last 168 hours CBC:  Recent Labs Lab 02/01/14 1428 02/01/14 1451  WBC 6.4  --   NEUTROABS 4.8  --   HGB 9.9* 10.5*  HCT 30.6* 31.0*  MCV 93.0  --   PLT 200  --    Cardiac Enzymes: No results found for this basename: CKTOTAL, CKMB, CKMBINDEX, TROPONINI,  in the last 168 hours BNP: BNP (last 3 results) No results found for this basename: PROBNP,  in the last 8760 hours CBG:  Recent Labs Lab 02/02/14 1218 02/02/14 1637 02/02/14 2034 02/03/14 0635 02/03/14 1138  GLUCAP 86 229* 154* 146* 153*       Signed:  Donne Hazel  [facility] 02/03/2014, 4:19 PM

## 2014-02-03 NOTE — Progress Notes (Signed)
Pt A&O x4; pt discharge education and instructions completed with pt and son at bedside. Both voices understanding and denies any questions. Pt provided handout info; All lines including IV and telemetry removed from pt. Pt had a bed bath prior to discharge as requested from pt and family. Pt transported off unit via wheelchair with son and belongings at side. Francis Gaines Miliano Cotten RN.

## 2014-05-21 ENCOUNTER — Encounter: Payer: Self-pay | Admitting: Interventional Cardiology

## 2014-07-01 ENCOUNTER — Other Ambulatory Visit: Payer: Self-pay | Admitting: *Deleted

## 2014-07-01 DIAGNOSIS — I803 Phlebitis and thrombophlebitis of lower extremities, unspecified: Secondary | ICD-10-CM

## 2014-07-01 DIAGNOSIS — R209 Unspecified disturbances of skin sensation: Secondary | ICD-10-CM

## 2014-07-21 ENCOUNTER — Encounter: Payer: Self-pay | Admitting: Vascular Surgery

## 2014-07-22 ENCOUNTER — Encounter: Payer: Self-pay | Admitting: Vascular Surgery

## 2014-07-22 ENCOUNTER — Ambulatory Visit (HOSPITAL_COMMUNITY)
Admission: RE | Admit: 2014-07-22 | Discharge: 2014-07-22 | Disposition: A | Discharge status: Home / Self Care | Payer: Medicare Other | Source: Ambulatory Visit | Attending: Vascular Surgery | Admitting: Vascular Surgery

## 2014-07-22 ENCOUNTER — Ambulatory Visit (INDEPENDENT_AMBULATORY_CARE_PROVIDER_SITE_OTHER): Payer: Medicare Other | Admitting: Vascular Surgery

## 2014-07-22 VITALS — BP 153/78 | HR 87 | Ht 60.0 in | Wt 195.0 lb

## 2014-07-22 DIAGNOSIS — I83899 Varicose veins of unspecified lower extremities with other complications: Secondary | ICD-10-CM | POA: Insufficient documentation

## 2014-07-22 DIAGNOSIS — I83893 Varicose veins of bilateral lower extremities with other complications: Secondary | ICD-10-CM | POA: Diagnosis not present

## 2014-07-22 DIAGNOSIS — R609 Edema, unspecified: Secondary | ICD-10-CM | POA: Insufficient documentation

## 2014-07-22 DIAGNOSIS — R209 Unspecified disturbances of skin sensation: Secondary | ICD-10-CM | POA: Insufficient documentation

## 2014-07-22 DIAGNOSIS — I803 Phlebitis and thrombophlebitis of lower extremities, unspecified: Secondary | ICD-10-CM | POA: Insufficient documentation

## 2014-07-22 NOTE — Progress Notes (Signed)
Referred by:  Antony Blackbird, MD [address] [place], [place] [id number]  Reason for referral:  Bilateral leg swelling and concern for superficial thrombosis  History of Present Illness  Ann Sellers is a 80 y.o. (22-May-1925) female who presents with chief complaint: bilateral leg swelling.  Patient notes, onset of swelling "years ago." The patient's symptoms include: intermittent cramping in the legs and superficial venous bleeding. She had an episode of bleeding 2-3 weeks ago after hitting her leg into the storm door. She applied pressure for 30 minutes and the bleeding stops. She denies any pain in her legs and the swelling does not interfere with her daily activities.  The patient has had no history of DVT, positive history of varicose vein, no history of venous stasis ulcers, and no history of lymphedema and positive history of skin changes in lower legs. She denies intermittent claudication and rest pain. There is no known family history of venous disorders. The patient does not know much about her parents. Her sisters have no history of DVT. The patient has  used compression stockings in the past but currently has trouble due to pain with her hands and inability to reach her feet.   She has a past medical history of TIA. She has diabetes managed with insulin and oral medications. She takes a daily aspirin. She is not on any hypertension or hyperlipidemia medications.   Past Medical History  Diagnosis Date  . B12 deficiency   . Asthma   . Diabetes mellitus   . Hypertension   . Arthritis   . Neuromuscular disorder   . Wears glasses   . Wears dentures     top denture-bottom partial  . Wears hearing aid     both ears  . Right rotator cuff tear   . Anemia   . Chronic kidney disease     Past Surgical History  Procedure Laterality Date  . Tonsillectomy  1936  . Appendectomy  1945  . Vesicovaginal fistula closure w/ tah  1968  . Herniated disc  1983  . Tympanostomy  tube placement  1989  . Foot neuroma surgery  1994  . Left knee surgery  1998  . Esophageal stretch  2001  . Left knee replacement  2002  . Cervical stynosis  2005  . Right wrist fracture  2006  . Cataract extraction, bilateral  2008  . Left hand surgery  2011    gout  . Tonsillectomy    . Shoulder arthroscopy with rotator cuff repair Right 01/05/2013    Procedure: SHOULDER ARTHROSCOPY WITH ROTATOR CUFF REPAIR subchromial decompression and distal clavical excision;  Surgeon: Lorn Junes, MD;  Location: [facility];  Service: Orthopedics;  Laterality: Right;    History   Social History  . Marital Status: Widowed    Spouse Name: N/A    Number of Children: 6  . Years of Education: N/A   Occupational History  . homemaker    Social History Main Topics  . Smoking status: Former Smoker -- 0.50 packs/day for 20 years    Types: Cigarettes    Quit date: 10/22/1987  . Smokeless tobacco: Not on file  . Alcohol Use: 4.2 oz/week    7 Glasses of wine per week     Comment: social drinker  . Drug Use: No  . Sexual Activity: Not on file   Other Topics Concern  . Not on file   Social History Narrative  . No  narrative on file    Family History  Problem Relation Age of Onset  . Pneumonia Mother   . Heart disease Sister   . Hypertension Sister   . Diabetes Sister   . Hypertension Sister   . Diabetes Daughter   . Diabetes Son   . Hypertension Son    No family history of DVT.   Current Outpatient Prescriptions on File Prior to Visit  Medication Sig Dispense Refill  . albuterol (PROVENTIL HFA;VENTOLIN HFA) 108 (90 BASE) MCG/ACT inhaler Inhale 2 puffs into the lungs every 6 (six) hours as needed.        Marland Kitchen albuterol (PROVENTIL) (2.5 MG/3ML) 0.083% nebulizer solution Take 2.5 mg by nebulization 3 (three) times daily as needed.        Marland Kitchen alendronate (FOSAMAX) 70 MG tablet Take 70 mg by mouth every Sunday. Take with a full glass of water on an empty stomach.      Marland Kitchen  allopurinol (ZYLOPRIM) 100 MG tablet Take 100 mg by mouth daily.        Marland Kitchen ALPRAZolam (XANAX) 0.25 MG tablet Take 1 tablet (0.25 mg total) by mouth 2 (two) times daily as needed for anxiety.  30 tablet  0  . ALPRAZolam (XANAX) 0.25 MG tablet Take 1 tablet (0.25 mg total) by mouth 2 (two) times daily as needed for anxiety.  30 tablet  0  . aspirin 81 MG tablet Take 81 mg by mouth daily.        . Calcium Carbonate-Vitamin D (CALCIUM PLUS VITAMIN D PO) Take 1 tablet by mouth daily.        . chlorpheniramine (CHLOR-TRIMETON) 4 MG tablet Take 8 mg by mouth at bedtime. 2 by mouth at bedtime      . colchicine 0.6 MG tablet Take 0.6 mg by mouth daily as needed (for gout flareups).       . cyanocobalamin (,VITAMIN B-12,) 1000 MCG/ML injection Inject 1,000 mcg into the muscle every 30 (thirty) days.        . diphenoxylate-atropine (LOMOTIL) 2.5-0.025 MG per tablet Take 1 tablet by mouth daily as needed for diarrhea or loose stools.      . furosemide (LASIX) 20 MG tablet Take 20 mg by mouth daily.      Marland Kitchen gabapentin (NEURONTIN) 100 MG capsule Take 100 mg by mouth 3 (three) times daily.      . insulin glargine (LANTUS) 100 UNIT/ML injection Inject 20 Units into the skin at bedtime.       Marland Kitchen losartan (COZAAR) 50 MG tablet Take 50 mg by mouth daily.      . mometasone (NASONEX) 50 MCG/ACT nasal spray Place 2 sprays into the nose daily.       . Multiple Vitamins-Minerals (WOMENS DAILY FORMULA PO) Take 1 tablet by mouth daily.        . sitaGLIPtin (JANUVIA) 100 MG tablet Take 100 mg by mouth daily.        Marland Kitchen triamcinolone cream (KENALOG) 0.1 % Apply 1 application topically 2 (two) times daily.       No current facility-administered medications on file prior to visit.    Allergies  Allergen Reactions  . Snake Antivenin [Antivenin Crotalidae Polyvalent (Snake Antivenin)] Other (See Comments)    unknown    REVIEW OF SYSTEMS:  (Positives checked otherwise negative)  CARDIOVASCULAR:  []  chest pain, []  chest  pressure, []  palpitations, []  shortness of breath when laying flat, [x]  shortness of breath with exertion,  [x]  pain in legs when  walking, []  pain in feet when laying flat, []  history of blood clot in veins (DVT), [x]  history of phlebitis, [x]  swelling in legs, [x]  varicose veins  PULMONARY:  []  productive cough, [x]  asthma, [x]  wheezing  NEUROLOGIC:  [x]  weakness in arms or legs, [x]  numbness in arms or legs, []  difficulty speaking or slurred speech, []  temporary loss of vision in one eye, []  dizziness  HEMATOLOGIC:  [x]  bleeding problems, []  problems with blood clotting too easily  MUSCULOSKEL:  []  joint pain, []  joint swelling  GASTROINTEST:  []  vomiting blood, []  blood in stool     GENITOURINARY:  []  burning with urination, []  blood in urine  PSYCHIATRIC:  []  history of major depression  INTEGUMENTARY:  [x]  rashes, []  ulcers  CONSTITUTIONAL:  []  fever, []  chills   Physical Examination Filed Vitals:   07/22/14 1145  BP: 153/78  Pulse: 87  Height: 5' (1.524 m)  Weight: 195 lb (88.451 kg)  SpO2: 99%   Body mass index is 38.08 kg/(m^2).  General: A&O x 3, WD obese elderly female in NAD   Head: [place]/AT  Ear/Nose/Throat: Hearing impaired bilaterally, nares w/o erythema or drainage  Eyes:  EOMI, PERRL  Neck: Supple, no nuchal rigidity, no palpable LAD  Pulmonary: Sym exp, good air movt, CTAB, no rales, rhonchi, & wheezing  Cardiac: RRR, Nl S1, S2, murmur heard best at right sternal border  Vascular: Vessel Right Left  Radial Palpable Palpable  Carotid Palpable, without bruit Palpable, without bruit  Aorta Not palpable N/A  Femoral Not palpable due to habitus Not palpable due to habitus  Popliteal Not palpable Not palpable  PT Not palpable Not palpable  DP Palpable Palpable   Gastrointestinal: soft, NTND, -G/R, - HSM, - masses  Musculoskeletal: M/S 5/5 throughout, Extremities without ischemic changes. 1+ swelling lower extremities bilaterally.   Neurologic: CN  2-12 intact , Pain and light touch intact in extremities. Motor exam as listed above  Psychiatric: Judgment intact, Mood & affect appropriate for pt's clinical situation  Dermatologic: Superficial telangectasias bilaterally, prominent vein of anterior left shin. Bilateral varicose veins.  Mild hemosiderin staining bilaterally.  No rashes otherwise noted   Non-Invasive Vascular Imaging   BLE Venous Insufficiency Duplex (Date: 07/22/2014):   RLE: negative DVT and SVT, negative GSV reflux except for mild SFJ reflux, negative deep venous reflux  LLE: negative DVT and SVT, negative GSV reflux, negative deep venous reflux, mild reflux in anterior calf vein  Medical Decision Making  SHENETRA BRAKEMAN is a 80 y.o. female who presents with: bilateral lower extremity swelling without evidence of superficial or deep venous thrombosis. She also has no evidence of superficial or deep venous reflux. She has palpable pedal pulses bilaterally signifying adequate arterial function. The intermittent cramping in her legs is likely related to her lower back problems. She will not require any further interventions at this point. She may wear compression stockings as desired. If she has any further venous bleeding episodes, she was advised to apply direct pressure to the area.   She will follow up on an as needed basis. Thank you for allowing [place] to participate in this patient's care.  Virgina Jock, PA-C [facility] Office: [phone]  07/22/2014, 12:24 PM  This patient was seen and examined in conjunction with Dr. Bridgett Larsson.   Addendum  I have independently interviewed and examined the patient, and I agree with the physician assistant's findings.  Pt has asx varicose vein.  History suggestive of trauma to  some her left anterior varicosities.  At most, I would suggest OTC compression stockings, but the patient apparently has tried this in the past and is incapable of putting them  on.  She is not interested in stab phlebectomy or sclerotherapy at this point.  She can follow up with [place] as needed.  Adele Barthel, MD [facility] Office: [phone] Pager: [phone]  07/22/2014, 1:13 PM

## 2015-03-07 ENCOUNTER — Other Ambulatory Visit: Payer: Self-pay | Admitting: Family

## 2015-03-07 ENCOUNTER — Ambulatory Visit
Admission: RE | Admit: 2015-03-07 | Discharge: 2015-03-07 | Disposition: A | Discharge status: Home / Self Care | Payer: Self-pay | Source: Ambulatory Visit | Attending: Family | Admitting: Family

## 2015-03-07 DIAGNOSIS — R0600 Dyspnea, unspecified: Secondary | ICD-10-CM

## 2015-03-29 ENCOUNTER — Ambulatory Visit (INDEPENDENT_AMBULATORY_CARE_PROVIDER_SITE_OTHER): Payer: Medicare Other | Admitting: Emergency Medicine

## 2015-03-29 ENCOUNTER — Encounter: Payer: Self-pay | Admitting: Emergency Medicine

## 2015-03-29 VITALS — BP 152/60 | HR 98 | Ht 60.0 in | Wt 198.0 lb

## 2015-03-29 DIAGNOSIS — R05 Cough: Secondary | ICD-10-CM

## 2015-03-29 DIAGNOSIS — R059 Cough, unspecified: Secondary | ICD-10-CM

## 2015-03-29 MED ORDER — OMEPRAZOLE 40 MG PO CPDR: 40.0000 mg | DELAYED_RELEASE_CAPSULE | Freq: Every day | ORAL | Status: DC

## 2015-03-29 NOTE — Progress Notes (Signed)
Subjective:    Patient ID: Ann Sellers, female    DOB: May 16, 1925, 81 y.o.   MRN: [id number]  HPI 81 year old woman, former minimal smoker with a history of childhood asthma, who developed chest tightness, dry cough and dyspnea again as an adult. Has been treated as recurrent asthma in the past with LABA/ICS, and is currently on Symbicort and Spiriva that was started 2 weeks ago by Dr Harold Hedge. She is also using Singulair (new), Nasonex, Allegra. She is on omeprazole 40mg  qd for the last 2-3 weeks (new). She has had intermittent flares of increased cough and dyspnea every few months that were being treated with prednisone and sometimes abx. This May she had a particularly bad flare. UA irritation, hoarse voice, lost voice. She was treated w pred, abx. She states that she is currently somewhat improved after the multiple interventions.    Review of Systems  Constitutional: Negative for fever and unexpected weight change.  HENT: Positive for sneezing. Negative for congestion, dental problem, ear pain, nosebleeds, postnasal drip, rhinorrhea, sinus pressure, sore throat and trouble swallowing.   Eyes: Negative for redness and itching.  Respiratory: Positive for cough and shortness of breath. Negative for chest tightness and wheezing.   Cardiovascular: Negative for palpitations and leg swelling.  Gastrointestinal: Negative for nausea and vomiting.  Genitourinary: Negative for dysuria.  Musculoskeletal: Negative for joint swelling.  Skin: Negative for rash.  Neurological: Negative for headaches.  Hematological: Does not bruise/bleed easily.  Psychiatric/Behavioral: Negative for dysphoric mood. The patient is not nervous/anxious.     Past Medical History  Diagnosis Date  . B12 deficiency   . Asthma   . Diabetes mellitus   . Hypertension   . Arthritis   . Neuromuscular disorder   . Wears glasses   . Wears dentures     top denture-bottom partial  . Wears hearing aid     both ears  .  Right rotator cuff tear   . Anemia   . Chronic kidney disease      Family History  Problem Relation Age of Onset  . Pneumonia Mother   . Heart disease Sister   . Hypertension Sister   . Diabetes Sister   . Hypertension Sister   . Diabetes Daughter   . Diabetes Son   . Hypertension Son      History   Social History  . Marital Status: Widowed    Spouse Name: N/A  . Number of Children: 6  . Years of Education: N/A   Occupational History  . homemaker    Social History Main Topics  . Smoking status: Former Smoker -- 0.50 packs/day for 20 years    Types: Cigarettes    Quit date: 10/22/1987  . Smokeless tobacco: Not on file  . Alcohol Use: 4.2 oz/week    7 Glasses of wine per week     Comment: social drinker  . Drug Use: No  . Sexual Activity: Not on file   Other Topics Concern  . Not on file   Social History Narrative     Allergies  Allergen Reactions  . Snake Antivenin [Antivenin Crotalidae Polyvalent (Snake Antivenin)] Other (See Comments)    unknown     Outpatient Prescriptions Prior to Visit  Medication Sig Dispense Refill  . albuterol (PROVENTIL HFA;VENTOLIN HFA) 108 (90 BASE) MCG/ACT inhaler Inhale 2 puffs into the lungs every 6 (six) hours as needed.      Marland Kitchen allopurinol (ZYLOPRIM) 100 MG tablet Take 100 mg by  mouth daily.      . cyanocobalamin (,VITAMIN B-12,) 1000 MCG/ML injection Inject 1,000 mcg into the muscle every 30 (thirty) days.      . diphenoxylate-atropine (LOMOTIL) 2.5-0.025 MG per tablet Take 1 tablet by mouth daily as needed for diarrhea or loose stools.    . furosemide (LASIX) 20 MG tablet Take 40 mg by mouth daily.     . insulin glargine (LANTUS) 100 UNIT/ML injection Inject 20 Units into the skin at bedtime.     . mometasone (NASONEX) 50 MCG/ACT nasal spray Place 2 sprays into the nose daily.     . sitaGLIPtin (JANUVIA) 100 MG tablet Take 100 mg by mouth daily.      Marland Kitchen losartan (COZAAR) 50 MG tablet Take 50 mg by mouth daily.    Marland Kitchen  albuterol (PROVENTIL) (2.5 MG/3ML) 0.083% nebulizer solution Take 2.5 mg by nebulization 3 (three) times daily as needed.      Marland Kitchen alendronate (FOSAMAX) 70 MG tablet Take 70 mg by mouth every Sunday. Take with a full glass of water on an empty stomach.    . ALPRAZolam (XANAX) 0.25 MG tablet Take 1 tablet (0.25 mg total) by mouth 2 (two) times daily as needed for anxiety. 30 tablet 0  . ALPRAZolam (XANAX) 0.25 MG tablet Take 1 tablet (0.25 mg total) by mouth 2 (two) times daily as needed for anxiety. 30 tablet 0  . aspirin 81 MG tablet Take 81 mg by mouth daily.      . Calcium Carbonate-Vitamin D (CALCIUM PLUS VITAMIN D PO) Take 1 tablet by mouth daily.      . chlorpheniramine (CHLOR-TRIMETON) 4 MG tablet Take 8 mg by mouth at bedtime. 2 by mouth at bedtime    . colchicine 0.6 MG tablet Take 0.6 mg by mouth daily as needed (for gout flareups).     . gabapentin (NEURONTIN) 100 MG capsule Take 100 mg by mouth 3 (three) times daily.    . Multiple Vitamins-Minerals (WOMENS DAILY FORMULA PO) Take 1 tablet by mouth daily.      Marland Kitchen triamcinolone cream (KENALOG) 0.1 % Apply 1 application topically 2 (two) times daily.     No facility-administered medications prior to visit.         Objective:   Physical Exam Filed Vitals:   03/29/15 1447  BP: 152/60  Pulse: 98  Height: 5' (1.524 m)  Weight: 198 lb (89.812 kg)  SpO2: 96%   Gen: Pleasant, obese elderly woman, in no distress,  normal affect  ENT: No lesions,  mouth clear,  oropharynx clear, no postnasal drip, some upper airway noise  Neck: No JVD, no TMG, no carotid bruits  Lungs: No use of accessory muscles, clear without rales or rhonchi  Cardiovascular: RRR, heart sounds normal, no murmur or gallops, no peripheral edema  Musculoskeletal: No deformities, no cyanosis or clubbing  Neuro: alert, non focal  Skin: Warm, no lesions or rashes    CXR  03/07/15 --  COMPARISON: 07/11/2011. 03/19/2011.  FINDINGS: Mediastinum hilar  structures are normal. Lungs are clear. Heart size normal. Prominent epicardial fat pad. No pleural effusion or pneumothorax. Degenerative changes thoracic spine. Prior cervical spine fusion.  IMPRESSION: No acute cardiopulmonary disease.       Assessment & Plan:  Cough Chronic cough that sounds in large part like upper airway irritation but certainly may also reflect poorly controlled asthma. Many of the factors that appear to be driving her cough can also make asthma difficult to manage. I agree with  more aggressive treatment of her allergic rhinitis with the addition of Singulair. I also strongly agree with empiric treatment for GERD. I would like to obtain her pulmonary function testing from Dr. Fredderick Phenix. I'll potentially repeat spirometry in the near future. She is on Symbicort and Spiriva and I believe we can stop the Spiriva for now. I would like to add a spacer to her Symbicort to allow better delivery. Finally I'll stop her losartan as this has been associated with cough. I will follow-up with her in 6 weeks to assess her cough and ensure that her blood pressure stable off the losartan

## 2015-03-29 NOTE — Patient Instructions (Addendum)
We will obtain your breathing tests from Dr Harold Hedge Please continue Symbicort 2 puffs twice a day. We will arrange for you to use this with a spacer. Please gargle and rinse her mouth after using STOP Spiriva for now STOP your losartan for now Please continue Singulair 10 mg every evening Please continue Nasonex nasal spray and Allegra as you have been taking them Please continue omeprazole 40 mg daily. Take this medication either 1 hour before one hour after eating Follow with Dr Lamonte Sakai in 6 weeks or sooner if you have any problems

## 2015-03-29 NOTE — Addendum Note (Signed)
Addended by: Desmond Dike C on: 03/29/2015 03:38 PM   Modules accepted: Orders

## 2015-03-29 NOTE — Assessment & Plan Note (Signed)
Chronic cough that sounds in large part like upper airway irritation but certainly may also reflect poorly controlled asthma. Many of the factors that appear to be driving her cough can also make asthma difficult to manage. I agree with more aggressive treatment of her allergic rhinitis with the addition of Singulair. I also strongly agree with empiric treatment for GERD. I would like to obtain her pulmonary function testing from Dr. Fredderick Phenix. I'll potentially repeat spirometry in the near future. She is on Symbicort and Spiriva and I believe we can stop the Spiriva for now. I would like to add a spacer to her Symbicort to allow better delivery. Finally I'll stop her losartan as this has been associated with cough. I will follow-up with her in 6 weeks to assess her cough and ensure that her blood pressure stable off the losartan

## 2015-03-30 ENCOUNTER — Telehealth: Payer: Self-pay | Admitting: Emergency Medicine

## 2015-03-30 MED ORDER — MONTELUKAST SODIUM 10 MG PO TABS: 10.0000 mg | ORAL_TABLET | Freq: Every day | ORAL | Status: DC

## 2015-03-30 MED ORDER — FEXOFENADINE HCL 180 MG PO TABS: 180.0000 mg | ORAL_TABLET | Freq: Every day | ORAL | Status: DC

## 2015-03-30 NOTE — Telephone Encounter (Signed)
Spoke with pt. States she needs refills on Allegra and Singulair. Both of these prescriptions have been sent in to her pharmacy. Nothing further was needed.

## 2015-05-12 ENCOUNTER — Ambulatory Visit (INDEPENDENT_AMBULATORY_CARE_PROVIDER_SITE_OTHER): Payer: Medicare Other | Admitting: Podiatry

## 2015-05-12 VITALS — BP 149/72 | HR 84 | Resp 18

## 2015-05-12 DIAGNOSIS — S91209A Unspecified open wound of unspecified toe(s) with damage to nail, initial encounter: Secondary | ICD-10-CM | POA: Diagnosis not present

## 2015-05-13 NOTE — Progress Notes (Signed)
Subjective:     Patient ID: Ann Sellers, female   DOB: 1925-10-14, 81 y.o.   MRN: [id number]  HPIThis patient presents with concern of her big toenail left foot.  She says her left toe becme red and swollen last week but no history of trauma or injury was noted.  She says her nail fell off a few days ago.  Now she says there is white discoloration over remnant nail.  Redness and swelling has subsided and no pain noted.   Review of Systems     Objective:   Physical Exam Vascular: dorsalis pedis and posterior tibial pulses are palpable bilateral. Capillary return is immediate. Temperature gradient is WNL. Skin turgor WNL  Sensorium: Diminished Semmes Weinstein monofilament test. Normal tactile sensation bilaterally. Nail Exam: Pt has disfigured discolored nail  entire nail hallux left. No signs of redness or infection.  Blackened area at proximal nail fold possibly indicating truma. Ulcer Exam: There is no evidence of ulcer or pre-ulcerative changes or infection. Orthopedic Exam: Muscle tone and strength are WNL. No limitations in general ROM. No crepitus or effusions noted. Foot type and digits show no abnormalities. Bony prominences are unremarkable. Skin: No Porokeratosis. No infection or ulcers      Assessment:     Avulsed Nail due to possible trauma     Plan:     IE.  Debride Nail left hallux

## 2015-05-16 ENCOUNTER — Encounter: Payer: Self-pay | Admitting: Emergency Medicine

## 2015-05-16 ENCOUNTER — Ambulatory Visit (INDEPENDENT_AMBULATORY_CARE_PROVIDER_SITE_OTHER): Payer: Medicare Other | Admitting: Emergency Medicine

## 2015-05-16 VITALS — BP 124/62 | HR 91 | Wt 192.0 lb

## 2015-05-16 DIAGNOSIS — J45909 Unspecified asthma, uncomplicated: Secondary | ICD-10-CM

## 2015-05-16 DIAGNOSIS — R05 Cough: Secondary | ICD-10-CM | POA: Diagnosis not present

## 2015-05-16 DIAGNOSIS — R059 Cough, unspecified: Secondary | ICD-10-CM

## 2015-05-16 NOTE — Assessment & Plan Note (Signed)
The dx is still unclear to me. Will plan to repeat her spirometry once her UA sx have completely subsided. For now based on her sx and the fact that she has largely stopped her inhaled meds, I feel comfortable stopping the Symbicort, staying off the spiriva. Continue to treat her UA irritation.

## 2015-05-16 NOTE — Assessment & Plan Note (Signed)
Upper airway irritation syndrome likely being influenced most by her allergic rhinitis. She also seems to have benefited from empiric omeprazole. She has been largely asymptomatic although she did have to work outside over the last couple days and this is increased or drainage and scratchy throat. I like to continue the same medicines and follow her for resolution of her cough. At that point we can consider spirometry and also possibly feeling all some of her regimen

## 2015-05-16 NOTE — Patient Instructions (Addendum)
Please stay off spiriva, stop your symbicort Use albuterol 2 puffs if needed for shortness of breath Continue singulair, nasonex, omeprazole.  Follow with Dr Lamonte Sakai in 4 months or sooner if you have any problems.

## 2015-05-16 NOTE — Progress Notes (Signed)
Subjective:    Patient ID: Ann Sellers, female    DOB: [birth year], 81 y.o.   MRN: [id number]  Cough Associated symptoms include shortness of breath. Pertinent negatives include no ear pain, eye redness, fever, headaches, postnasal drip, rash, rhinorrhea, sore throat or wheezing.   81 year old woman, former minimal smoker with a history of childhood asthma, who developed chest tightness, dry cough and dyspnea again as an adult. Has been treated as recurrent asthma in the past with LABA/ICS, and is currently on Symbicort and Spiriva that was started 2 weeks ago by Dr Harold Hedge. She is also using Singulair (new), Nasonex, Allegra. She is on omeprazole 40mg  qd for the last 2-3 weeks (new). She has had intermittent flares of increased cough and dyspnea every few months that were being treated with prednisone and sometimes abx. This May she had a particularly bad flare. UA irritation, hoarse voice, lost voice. She was treated w pred, abx. She states that she is currently somewhat improved after the multiple interventions.   ROV 05/16/15 -- patient follows up for chronic cough that has components consistent with upper airway irritation syndrome although she does carry a history of chronic asthma. At her last visit we continued to treat GERD and rhinitis, we stopped Spiriva and losartan as potential contributors to call. We also added a spacer for her Symbicort. She has been skipping the symbicort for last 2 weeks, didn't miss it. She has been doing well until today - she had to do some outdoor work 2 days ago, dust and pollen exposure.      Review of Systems  Constitutional: Negative for fever and unexpected weight change.  HENT: Positive for sneezing. Negative for congestion, dental problem, ear pain, nosebleeds, postnasal drip, rhinorrhea, sinus pressure, sore throat and trouble swallowing.   Eyes: Negative for redness and itching.  Respiratory: Positive for cough and shortness of breath. Negative for  chest tightness and wheezing.   Cardiovascular: Negative for palpitations and leg swelling.  Gastrointestinal: Negative for nausea and vomiting.  Genitourinary: Negative for dysuria.  Musculoskeletal: Negative for joint swelling.  Skin: Negative for rash.  Neurological: Negative for headaches.  Hematological: Does not bruise/bleed easily.  Psychiatric/Behavioral: Negative for dysphoric mood. The patient is not nervous/anxious.        Objective:   Physical Exam Filed Vitals:   05/16/15 1413  BP: 124/62  Pulse: 91  Weight: 192 lb (87.091 kg)  SpO2: 95%   Gen: Pleasant, obese elderly woman, in no distress,  normal affect  ENT: No lesions,  mouth clear,  oropharynx clear, no postnasal drip, some upper airway noise  Neck: No JVD, no TMG, no carotid bruits  Lungs: No use of accessory muscles, clear without rales or rhonchi  Cardiovascular: RRR, heart sounds normal, no murmur or gallops, trace peripheral edema  Musculoskeletal: No deformities, no cyanosis or clubbing  Neuro: alert, non focal  Skin: Warm, no lesions or rashes    CXR  03/07/15 --  COMPARISON: 07/11/2011. 03/19/2011.  FINDINGS: Mediastinum hilar structures are normal. Lungs are clear. Heart size normal. Prominent epicardial fat pad. No pleural effusion or pneumothorax. Degenerative changes thoracic spine. Prior cervical spine fusion.  IMPRESSION: No acute cardiopulmonary disease.       Assessment & Plan:  Asthma The dx is still unclear to me. Will plan to repeat her spirometry once her UA sx have completely subsided. For now based on her sx and the fact that she has largely stopped her inhaled meds, I feel  comfortable stopping the Symbicort, staying off the spiriva. Continue to treat her UA irritation.   Cough Upper airway irritation syndrome likely being influenced most by her allergic rhinitis. She also seems to have benefited from empiric omeprazole. She has been largely asymptomatic although  she did have to work outside over the last couple days and this is increased or drainage and scratchy throat. I like to continue the same medicines and follow her for resolution of her cough. At that point we can consider spirometry and also possibly feeling all some of her regimen

## 2015-05-17 ENCOUNTER — Encounter: Payer: Self-pay | Admitting: Podiatry

## 2015-06-16 ENCOUNTER — Other Ambulatory Visit: Payer: Self-pay | Admitting: Emergency Medicine

## 2015-06-16 MED ORDER — MONTELUKAST SODIUM 10 MG PO TABS: 10.0000 mg | ORAL_TABLET | Freq: Every day | ORAL | Status: DC

## 2015-06-16 MED ORDER — FEXOFENADINE HCL 180 MG PO TABS: 180.0000 mg | ORAL_TABLET | Freq: Every day | ORAL | Status: DC

## 2015-07-06 ENCOUNTER — Encounter: Payer: Self-pay | Admitting: Internal Medicine

## 2015-07-06 ENCOUNTER — Ambulatory Visit (INDEPENDENT_AMBULATORY_CARE_PROVIDER_SITE_OTHER): Payer: Medicare Other | Admitting: Internal Medicine

## 2015-07-06 VITALS — BP 144/64 | HR 78 | Ht 60.0 in | Wt 190.0 lb

## 2015-07-06 DIAGNOSIS — E669 Obesity, unspecified: Secondary | ICD-10-CM | POA: Diagnosis not present

## 2015-07-06 DIAGNOSIS — R05 Cough: Secondary | ICD-10-CM | POA: Diagnosis not present

## 2015-07-06 DIAGNOSIS — J4550 Severe persistent asthma, uncomplicated: Secondary | ICD-10-CM | POA: Diagnosis not present

## 2015-07-06 DIAGNOSIS — R059 Cough, unspecified: Secondary | ICD-10-CM

## 2015-07-06 MED ORDER — PREDNISONE 10 MG PO TABS: ORAL_TABLET | ORAL | Status: DC

## 2015-07-06 MED ORDER — MOMETASONE FURO-FORMOTEROL FUM 100-5 MCG/ACT IN AERO: INHALATION_SPRAY | RESPIRATORY_TRACT | Status: DC

## 2015-07-06 NOTE — Assessment & Plan Note (Signed)
Dr Harold Hedge eval 03/15/15 FeNO was nl and IgE 5 rec start high dose symbicort / spiriva  For possible ACOS with spirometry FEV1  0.82 (59%) ratio 58 > no better on spirva so stopped - 07/06/2015  extensive coaching HFA effectiveness =    50% from a baseline of < 10%   Hx strongly suggests a reversible component but based on her demonstrated hfa component I don't believe she's really had a trial of topical rx and yet reports a robust response to prednisone   I had an extended discussion with the patient reviewing all relevant studies completed to date and  Lasting 60 m of a 40 min ov     Each maintenance medication was reviewed in detail including most importantly the difference between maintenance and prns and under what circumstances the prns are to be triggered using an action plan format that is not reflected in the computer generated alphabetically organized AVS.    Please see instructions for details which were reviewed in writing and the patient given a copy highlighting the part that I personally wrote and discussed at today's ov.

## 2015-07-06 NOTE — Patient Instructions (Addendum)
Prednisone Take 4 for two days three for two days two for two days one for two days  Stop symbicort and start dulera 100 Take 2 puffs first thing in am and then another 2 puffs about 12 hours later.    Work on inhaler technique:  relax and gently blow all the way out then take a nice smooth deep breath back in, triggering the inhaler at same time you start breathing in.  Hold for up to 5 seconds if you can. Blow out thru nose. Rinse and gargle with water when done    Stop boniva  Increase omeprazole to Take 30- 60 min before your first and last meals of the day  GERD (REFLUX)  is an extremely common cause of respiratory symptoms just like yours , many times with no obvious heartburn at all.    It can be treated with medication, but also with lifestyle changes including elevation of the head of your bed (ideally with 6 inch  bed blocks),  Smoking cessation, avoidance of late meals, excessive alcohol, and avoid fatty foods, chocolate, peppermint, colas, red wine, and acidic juices such as orange juice.  NO MINT OR MENTHOL PRODUCTS SO NO COUGH DROPS  USE SUGARLESS CANDY INSTEAD (Jolley ranchers or Stover's or Life Savers) or even ice chips will also do - the key is to swallow to prevent all throat clearing. NO OIL BASED VITAMINS - use powdered substitutes.   Please schedule a follow up office visit in 2 weeks, sooner if needed with all meds in hand in 2 bags - automatics vs as needed (remember bag# 2 is up to you!)

## 2015-07-06 NOTE — Progress Notes (Signed)
Subjective:    Patient ID: Ann Sellers, female    DOB: 14-Jan-1925, 81 y.o.   MRN: [id number]  Brief patient profile:  81 year old woman, former minimal smoker with a history of childhood asthma, who developed chest tightness, dry cough and dyspnea again as an adult. Has been treated as recurrent asthma in the past with LABA/ICS,   Dr Ann Sellers eval 03/15/15 FeNO was nl and IgE 5 rec start high dose symbicort / spiriva  For possible ACOS with spirometry FEV1  0.82 (59%) ratio 58  Dr Ann Sellers ov  ROV 05/16/15 -- patient follows up for chronic cough that has components consistent with upper airway irritation syndrome although she does carry a history of chronic asthma. At her last visit we continued to treat GERD and rhinitis, we stopped Spiriva and losartan as potential contributors to call. We also added a spacer for her Symbicort. She has been skipping the symbicort for last 2 weeks, didn't miss it. She has been doing well until today - she had to do some outdoor work 2 days prior to OV   dust and pollen exposure.  rec Please stay off spiriva, stop your symbicort Use albuterol 2 puffs if needed for shortness of breath Continue singulair, nasonex, omeprazole    07/06/2015 acute extended ov/Ann Sellers re: refractory chronic cough  Chief Complaint  Patient presents with  . Acute Visit    Pt c/o cough x 1 wk- prod with grey to green sputum.        Cough "all her life" very confused with details of care except for the hx that every time she takes prednisone she is quite a bit better. Cough is more day than noct,  Starts After stirs in am, not typically waking her once asleep   No obvious patterns in day to day or daytime variability or assoc cp or chest tightness, subjective wheeze  or hb symptoms. No unusual exp hx or h/o childhood pna/ asthma or knowledge of premature birth.  Sleeping ok without nocturnal  or early am exacerbation  of respiratory  c/o's or need for noct saba. Also denies any  obvious fluctuation of symptoms with weather or environmental changes or other aggravating or alleviating factors except as outlined above   Current Medications, Allergies, Complete Past Medical History, Past Surgical History, Family History, and Social History were reviewed in [employer] record.  ROS  The following are not active complaints unless bolded sore throat, dysphagia, dental problems, itching, sneezing,  nasal congestion or excess/ purulent secretions, ear ache,   fever, chills, sweats, unintended wt loss, classically pleuritic or exertional cp, hemoptysis,  orthopnea pnd or leg swelling, presyncope, palpitations, abdominal pain, anorexia, nausea, vomiting, diarrhea  or change in bowel or bladder habits, change in stools or urine, dysuria,hematuria,  rash, arthralgias, visual complaints, headache, numbness, weakness or ataxia or problems with walking or coordination,  change in mood/affect or memory.                   Objective:   Physical Exa  Gen: Pleasant, obese elderly woman, in no distress,  normal affect harsh barking quality upper airway  cough   Wt Readings from Last 3 Encounters:  07/06/15 190 lb (86.183 kg)  05/16/15 192 lb (87.091 kg)  03/29/15 198 lb (89.812 kg)    Vital signs reviewed   ENT: No lesions,  mouth clear,  oropharynx clear, no postnasal drip, some upper airway noise  Neck: No JVD, no TMG, no  carotid bruits  Lungs: insp and exp rhonchi with congested sounding coughing fits   Cardiovascular: RRR, heart sounds normal, no murmur or gallops, trace peripheral edema  Musculoskeletal: No deformities, no cyanosis or clubbing  Neuro: alert, non focal  Skin: Warm, no lesions or rashes    CXR  03/07/15 --  COMPARISON: 07/11/2011. 03/19/2011.  FINDINGS: Mediastinum hilar structures are normal. Lungs are clear. Heart size normal. Prominent epicardial fat pad. No pleural effusion or pneumothorax. Degenerative changes  thoracic spine. Prior cervical spine fusion.  IMPRESSION: No acute cardiopulmonary disease.       Assessment & Plan:

## 2015-07-06 NOTE — Assessment & Plan Note (Signed)
Classic Upper airway cough syndrome, so named because it's frequently impossible to sort out how much is  CR/sinusitis with freq throat clearing (which can be related to primary GERD)   vs  causing  secondary (" extra esophageal")  GERD from wide swings in gastric pressure that occur with throat clearing, often  promoting self use of mint and menthol lozenges that reduce the lower esophageal sphincter tone and exacerbate the problem further in a cyclical fashion.   These are the same pts (now being labeled as having "irritable larynx syndrome" by some cough centers) who not infrequently have a history of having failed to tolerate ace inhibitors,  dry powder inhalers or biphosphonates or report having atypical reflux symptoms that don't respond to standard doses of PPI , and are easily confused as having aecopd or asthma flares by even experienced allergists/ pulmonologists.   Until we sort out her problems, strongly rec 1) max rx for GERD 2) remain off boniva and if really needs biphosphonate consider IV reclast instead 3) remain off spiriva dpi > could try the respimat later for acos though there is not evidence based med to support its use here.   See instructions for specific recommendations which were reviewed directly with the patient who was given a copy with highlighter outlining the key components.

## 2015-07-06 NOTE — Assessment & Plan Note (Signed)
Body mass index is 37.11 kg/(m^2).  No results found for: TSH   Contributing to gerd tendency/ doe/reviewed need  achieve and maintain neg calorie balance > defer f/u primary care including intermittently monitoring thyroid status

## 2015-07-11 ENCOUNTER — Other Ambulatory Visit: Payer: Self-pay | Admitting: Emergency Medicine

## 2015-07-24 ENCOUNTER — Encounter: Payer: Self-pay | Admitting: Internal Medicine

## 2015-07-24 ENCOUNTER — Ambulatory Visit (INDEPENDENT_AMBULATORY_CARE_PROVIDER_SITE_OTHER): Payer: Medicare Other | Admitting: Internal Medicine

## 2015-07-24 VITALS — BP 132/64 | HR 78 | Ht 59.0 in | Wt 193.0 lb

## 2015-07-24 DIAGNOSIS — R059 Cough, unspecified: Secondary | ICD-10-CM

## 2015-07-24 DIAGNOSIS — E669 Obesity, unspecified: Secondary | ICD-10-CM

## 2015-07-24 DIAGNOSIS — J4550 Severe persistent asthma, uncomplicated: Secondary | ICD-10-CM

## 2015-07-24 DIAGNOSIS — R05 Cough: Secondary | ICD-10-CM

## 2015-07-24 MED ORDER — PREDNISONE 10 MG PO TABS
ORAL_TABLET | ORAL | Status: DC
Start: 2015-07-24 — End: 2015-08-22

## 2015-07-24 MED ORDER — FLUTTER DEVI: Status: DC

## 2015-07-24 NOTE — Progress Notes (Signed)
Subjective:    Patient ID: Ann Sellers, female    DOB: [birth year], 81 y.o.   MRN: [id number]  Brief patient profile:  81 year old woman, former minimal smoker quit in 1980s  with a history of childhood asthma, who developed chest tightness, dry cough and dyspnea again as an adult. Has been treated as recurrent asthma in the past with LABA/ICS,   Dr Harold Hedge eval 03/15/15 FeNO was nl and IgE 5 rec start high dose symbicort / spiriva  For possible ACOS with spirometry FEV1  0.82 (59%) ratio 58  Dr Lamonte Sakai ov  ROV 05/16/15 -- patient follows up for chronic cough that has components consistent with upper airway irritation syndrome although she does carry a history of chronic asthma. At her last visit we continued to treat GERD and rhinitis, we stopped Spiriva and losartan as potential contributors to call. We also added a spacer for her Symbicort. She has been skipping the symbicort for last 2 weeks, didn't miss it. She has been doing well until today - she had to do some outdoor work 2 days prior to OV   dust and pollen exposure.  rec Please stay off spiriva, stop your symbicort Use albuterol 2 puffs if needed for shortness of breath Continue singulair, nasonex, omeprazole    07/06/2015 acute extended ov/Crimson Dubberly re: refractory chronic cough  Chief Complaint  Patient presents with  . Acute Visit    Pt c/o cough x 1 wk- prod with grey to green sputum.   Cough "all her life" very confused with details of care except for the hx that every time she takes prednisone she is quite a bit better. Cough is more day than noct,  Starts After stirs in am, not typically waking her once asleep  rec Prednisone Take 4 for two days three for two days two for two days one for two days Stop symbicort and start dulera 100 Take 2 puffs first thing in am and then another 2 puffs about 12 hours later.  Work on inhaler technique:     Stop boniva Increase omeprazole to Take 30- 60 min before your first and last  meals of the day GERD  Diet  Please schedule a follow up office visit in 2 weeks, sooner if needed with all meds in hand in 2 bags - automatics vs as needed (remember bag# 2 is up to you!)   07/24/2015  f/u ov/Lucill Mauck re: refractory chronic cough / did not bring meds  Chief Complaint  Patient presents with  . Follow-up    Pt states her cough had resolved while on pred, and then started back when finished. Cough is non prod at this time.    Continues mostly daytime coughing fits   No obvious patterns in day to day or daytime variability or assoc sob or  cp or chest tightness, subjective wheeze  or hb symptoms. No unusual exp hx or h/o childhood pna/ asthma or knowledge of premature birth.  Sleeping ok without nocturnal  or early am exacerbation  of respiratory  c/o's or need for noct saba. Also denies any obvious fluctuation of symptoms with weather or environmental changes or other aggravating or alleviating factors except as outlined above   Current Medications, Allergies, Complete Past Medical History, Past Surgical History, Family History, and Social History were reviewed in [employer] record.  ROS  The following are not active complaints unless bolded sore throat, dysphagia, dental problems, itching, sneezing,  nasal congestion or excess/ purulent  secretions, ear ache,   fever, chills, sweats, unintended wt loss, classically pleuritic or exertional cp, hemoptysis,  orthopnea pnd or leg swelling, presyncope, palpitations, abdominal pain, anorexia, nausea, vomiting, diarrhea  or change in bowel or bladder habits, change in stools or urine, dysuria,hematuria,  rash, arthralgias, visual complaints, headache, numbness, weakness or ataxia or problems with walking or coordination,  change in mood/affect or memory.                   Objective:   Physical Exa  Gen: Pleasant, obese elderly woman, in no distress,  normal affect / struggles to get on exam table with one  person assist     07/24/2015        193  Wt Readings from Last 3 Encounters:  07/06/15 190 lb (86.183 kg)  05/16/15 192 lb (87.091 kg)  03/29/15 198 lb (89.812 kg)    Vital signs reviewed   ENT: No lesions,  mouth clear,  oropharynx clear, no postnasal drip, some upper airway noise  Neck: No JVD, no TMG, no carotid bruits  Lungs:  Completely clear with purse lip breathing   Cardiovascular: RRR, heart sounds normal, no murmur or gallops, trace peripheral edema  Musculoskeletal: No deformities, no cyanosis or clubbing  Neuro: alert, non focal  Skin: Warm, no lesions or rashes    CXR  03/07/15 --  COMPARISON: 07/11/2011. 03/19/2011.  FINDINGS: Mediastinum hilar structures are normal. Lungs are clear. Heart size normal. Prominent epicardial fat pad. No pleural effusion or pneumothorax. Degenerative changes thoracic spine. Prior cervical spine fusion.  IMPRESSION: No acute cardiopulmonary disease       Assessment & Plan:

## 2015-07-24 NOTE — Patient Instructions (Signed)
Continue  dulera 100 Take 2 puffs first thing in am and then another 2 puffs about 12 hours later.   Work on inhaler technique:  relax and gently blow all the way out then take a nice smooth deep breath back in, triggering the inhaler at same time you start breathing in.  Hold for up to 5 seconds if you can. Blow out thru nose. Rinse and gargle with water when done  Cough into flutter valve as much as possible and purse lip breathing with exercise.  If cough worse >> Prednisone 10 mg Take 4 for two days three for two days two for two days one for two days   Please schedule a follow up office visit in 4 weeks, sooner if needed

## 2015-07-31 NOTE — Assessment & Plan Note (Addendum)
Dr Harold Hedge eval 03/15/15 FeNO was nl and IgE 5 rec start high dose symbicort / spiriva  For possible ACOS with spirometry FEV1  0.82 (59%) ratio 58 > no better on spirva so stopped - 07/24/2015  extensive coaching HFA effectiveness =    50% from a baseline of  25%   DDX of  difficult airways management all start with A and  include Adherence, Ace Inhibitors, Acid Reflux, Active Sinus Disease, Alpha 1 Antitripsin deficiency, Anxiety masquerading as Airways dz,  ABPA,  allergy(esp in young), Aspiration (esp in elderly), Adverse effects of meds,  Active smokers, A bunch of PE's (a small clot burden can't cause this syndrome unless there is already severe underlying pulm or vascular dz with poor reserve) plus two Bs  = Bronchiectasis and Beta blocker use..and one C= CHF  Adherence is always the initial "prime suspect" and is a multilayered concern that requires a "trust but verify" approach in every patient - starting with knowing how to use medications, especially inhalers, correctly, keeping up with refills and understanding the fundamental difference between maintenance and prns vs those medications only taken for a very short course and then stopped and not refilled.  The proper method of use, as well as anticipated side effects, of a metered-dose inhaler are discussed and demonstrated to the patient. Improved effectiveness after extensive coaching during this visit to a level of approximately  50% so needs to keep working on it  ? Allergy > doubt based on feno/ allergy w/u > ok to continue singulair rx with only 6 days pred : Prednisone 10 mg take  4 each am x 2 days,   2 each am x 2 days,  1 each am x 2 days and stop   ? Acid (or non-acid) GERD > always difficult to exclude as up to 75% of pts in some series report no assoc GI/ Heartburn symptoms> rec continue max (24h)  acid suppression and diet restrictions/ reviewed     ? Anxiety/depression > usually dx of exclusion  I had an extended  discussion with the patient reviewing all relevant studies completed to date and  lasting 15 to 20 minutes of a 25 minute visit    Each maintenance medication was reviewed in detail including most importantly the difference between maintenance and prns and under what circumstances the prns are to be triggered using an action plan format that is not reflected in the computer generated alphabetically organized AVS.    Please see instructions for details which were reviewed in writing and the patient given a copy highlighting the part that I personally wrote and discussed at today's ov.

## 2015-07-31 NOTE — Assessment & Plan Note (Signed)
Body mass index is 38.96  Trending up  No results found for: TSH   Contributing to gerd tendency/ doe/reviewed the need and the process to achieve and maintain neg calorie balance > defer f/u primary care including intermittently monitoring thyroid status

## 2015-07-31 NOTE — Assessment & Plan Note (Signed)
Added flutter valve 0000000 to minimize cyclical coughing

## 2015-08-22 ENCOUNTER — Ambulatory Visit (INDEPENDENT_AMBULATORY_CARE_PROVIDER_SITE_OTHER): Payer: Medicare Other | Admitting: Internal Medicine

## 2015-08-22 ENCOUNTER — Encounter: Payer: Self-pay | Admitting: Internal Medicine

## 2015-08-22 VITALS — BP 140/76 | HR 76 | Ht 59.0 in | Wt 197.0 lb

## 2015-08-22 DIAGNOSIS — J4550 Severe persistent asthma, uncomplicated: Secondary | ICD-10-CM | POA: Diagnosis not present

## 2015-08-22 MED ORDER — MOMETASONE FURO-FORMOTEROL FUM 100-5 MCG/ACT IN AERO
INHALATION_SPRAY | RESPIRATORY_TRACT | Status: DC
Start: 2015-08-22 — End: 2020-01-17

## 2015-08-22 MED ORDER — OMEPRAZOLE 40 MG PO CPDR: 40.0000 mg | DELAYED_RELEASE_CAPSULE | Freq: Two times a day (BID) | ORAL | Status: DC

## 2015-08-22 NOTE — Progress Notes (Signed)
Subjective:    Patient ID: Ann Sellers, female    DOB: Apr 10, 1925, 81 y.o.   MRN: [id number]  Brief patient profile:  81 year old woman, former minimal smoker quit in 1980s  with a history of childhood asthma, who developed chest tightness, dry cough and dyspnea again as an adult. Has been treated as recurrent asthma in the past with LABA/ICS,   Dr Harold Hedge eval 03/15/15 FeNO was nl and IgE 5 rec start high dose symbicort / spiriva  For possible ACOS with spirometry FEV1  0.82 (59%) ratio 58  Dr Lamonte Sakai ov  ROV 05/16/15 -- patient follows up for chronic cough that has components consistent with upper airway irritation syndrome although she does carry a history of chronic asthma. At her last visit we continued to treat GERD and rhinitis, we stopped Spiriva and losartan as potential contributors to call. We also added a spacer for her Symbicort. She has been skipping the symbicort for last 2 weeks, didn't miss it. She has been doing well until today - she had to do some outdoor work 2 days prior to OV   dust and pollen exposure.  rec Please stay off spiriva, stop your symbicort Use albuterol 2 puffs if needed for shortness of breath Continue singulair, nasonex, omeprazole    07/06/2015 acute extended ov/Wert re: refractory chronic cough  Chief Complaint  Patient presents with  . Acute Visit    Pt c/o cough x 1 wk- prod with grey to green sputum.   Cough "all her life" very confused with details of care except for the hx that every time she takes prednisone she is quite a bit better. Cough is more day than noct,  Starts After stirs in am, not typically waking her once asleep  rec Prednisone Take 4 for two days three for two days two for two days one for two days Stop symbicort and start dulera 100 Take 2 puffs first thing in am and then another 2 puffs about 12 hours later.  Work on inhaler technique:     Stop boniva Increase omeprazole to Take 30- 60 min before your first and last  meals of the day GERD  Diet  Please schedule a follow up office visit in 2 weeks, sooner if needed with all meds in hand in 2 bags - automatics vs as needed (remember bag# 2 is up to you!)   07/24/2015  f/u ov/Wert re: refractory chronic cough / did not bring meds  Chief Complaint  Patient presents with  . Follow-up    Pt states her cough had resolved while on pred, and then started back when finished. Cough is non prod at this time.   Continues mostly daytime coughing fits rec Continue  dulera 100 Take 2 puffs first thing in am and then another 2 puffs about 12 hours later.  Work on inhaler technique:  Cough into flutter valve as much as possible and purse lip breathing with exercise. If cough worse >> Prednisone 10 mg Take 4 for two days three for two days two for two days one for two days and stop    08/22/2015  f/u ov/Wert re: uacs vs asthma / did not bring meds ? Still on dulera 200?  Chief Complaint  Patient presents with  . Follow-up    Pt states that her cough has resolved. She is still clearing her throat often.   Not limited by breathing from desired activities  But by weakness/ unsteady on feet  No obvious patterns in day to day or daytime variability or assoc sob or  cp or chest tightness, subjective wheeze  or hb symptoms. No unusual exp hx or h/o childhood pna/ asthma or knowledge of premature birth.  Sleeping ok without nocturnal  or early am exacerbation  of respiratory  c/o's or need for noct saba. Also denies any obvious fluctuation of symptoms with weather or environmental changes or other aggravating or alleviating factors except as outlined above   Current Medications, Allergies, Complete Past Medical History, Past Surgical History, Family History, and Social History were reviewed in [employer] record.  ROS  The following are not active complaints unless bolded sore throat, dysphagia, dental problems, itching, sneezing,  nasal congestion or  excess/ purulent secretions, ear ache,   fever, chills, sweats, unintended wt loss, classically pleuritic or exertional cp, hemoptysis,  orthopnea pnd or leg swelling, presyncope, palpitations, abdominal pain, anorexia, nausea, vomiting, diarrhea  or change in bowel or bladder habits, change in stools or urine, dysuria,hematuria,  rash, arthralgias, visual complaints, headache, numbness, weakness or ataxia or problems with walking or coordination,  change in mood/affect or memory.                   Objective:   Physical Exa  Gen: Pleasant, obese elderly woman, in no distress,  normal affect / struggles to get on exam table with one person assist     07/24/2015        193  Wt Readings from Last 3 Encounters:  07/06/15 190 lb (86.183 kg)  05/16/15 192 lb (87.091 kg)  03/29/15 198 lb (89.812 kg)    Vital signs reviewed   ENT: No lesions,  mouth clear,  oropharynx clear, no postnasal drip, some upper airway noise  Neck: No JVD, no TMG, no carotid bruits  Lungs:  Completely clear with purse lip breathing   Cardiovascular: RRR, heart sounds normal, no murmur or gallops, trace peripheral edema  Musculoskeletal: No deformities, no cyanosis or clubbing  Neuro: alert, non focal  Skin: Warm, no lesions or rashes        I personally reviewed images and agree with radiology impression as follows:  CXR:   03/07/15   No acute cardiopulmonary disease       Assessment & Plan:

## 2015-08-22 NOTE — Patient Instructions (Signed)
Ok to reduce the dulera 100 Take 2 puffs first thing in am and then another 2 puffs about 12 hours later.   Only use your albuterol as a rescue medication to be used if you can't catch your breath by resting or doing a relaxed purse lip breathing pattern.  - The less you use it, the better it will work when you need it. - Ok to use up to 2 puffs  every 4 hours if you must but call for immediate appointment if use goes up over your usual need - Don't leave home without it !!  (think of it like the spare tire for your car)  Continue prilosec 40 mg Take 30- 60 min before your first and last meals of the day   Work on inhaler technique:  relax and gently blow all the way out then take a nice smooth deep breath back in, triggering the inhaler at same time you start breathing in.  Hold for up to 5 seconds if you can. Blow out thru nose. Rinse and gargle with water when done      Please schedule a follow up visit in 3 months but call sooner if needed

## 2015-08-25 ENCOUNTER — Encounter: Payer: Self-pay | Admitting: Internal Medicine

## 2015-08-25 NOTE — Assessment & Plan Note (Signed)
Dr Harold Hedge eval 03/15/15 FeNO was nl and IgE 5 rec start high dose symbicort / spiriva  For possible ACOS with spirometry FEV1  0.82 (59%) ratio 58 > no better on spirva so stopped - 07/24/2015   try dulera 100 2bid ? Still on the 200  - 08/22/2015  extensive coaching HFA effectiveness =    50% > try dulera 100 2bid   Not really clear there is asthma here but doing some better despite poor hfa - since high dose ICS can irrittate upper airway and contribute to throat clearing rec try dulera 100 Take 2 puffs first thing in am and then another 2 puffs about 12 hours later.      I had an extended discussion with the patient reviewing all relevant studies completed to date and  lasting 15 to 20 minutes of a 25 minute visit    Each maintenance medication was reviewed in detail including most importantly the difference between maintenance and prns and under what circumstances the prns are to be triggered using an action plan format that is not reflected in the computer generated alphabetically organized AVS.    Please see instructions for details which were reviewed in writing and the patient given a copy highlighting the part that I personally wrote and discussed at today's ov.

## 2015-08-25 NOTE — Assessment & Plan Note (Signed)
Body mass index is 39.77 trending down slightly  No results found for: TSH   Contributing to DM/ gerd tendency/ doe/reviewed the need and the process to achieve and maintain neg calorie balance > defer f/u primary care including intermittently monitoring thyroid status

## 2015-09-04 ENCOUNTER — Telehealth: Payer: Self-pay | Admitting: Internal Medicine

## 2015-09-05 NOTE — Telephone Encounter (Signed)
Spoke with pt,aware of recs.  Nothing further needed.  

## 2015-09-05 NOTE — Telephone Encounter (Signed)
Called and spoke to pt. Pt stated her insurance will not cover the Prilosec 40mg  BID, they will only cover 1 tab daily. [place] and they confirmed the same and stated the Prilosec BID is needing a PA.  Dr. Melvyn Novas please advise. Thanks.

## 2015-09-05 NOTE — Telephone Encounter (Signed)
Leave the prilosec at 40 mg daily and add otc pepcid 20 mg after supper or bedtime

## 2015-10-04 ENCOUNTER — Other Ambulatory Visit: Payer: Self-pay | Admitting: Emergency Medicine

## 2015-10-18 ENCOUNTER — Other Ambulatory Visit: Payer: Self-pay | Admitting: Family Medicine

## 2015-10-18 DIAGNOSIS — R0989 Other specified symptoms and signs involving the circulatory and respiratory systems: Secondary | ICD-10-CM

## 2015-10-18 DIAGNOSIS — M79605 Pain in left leg: Secondary | ICD-10-CM

## 2015-10-18 DIAGNOSIS — M79604 Pain in right leg: Secondary | ICD-10-CM

## 2015-10-27 ENCOUNTER — Ambulatory Visit
Admission: RE | Admit: 2015-10-27 | Discharge: 2015-10-27 | Disposition: A | Discharge status: Home / Self Care | Payer: Medicare Other | Source: Ambulatory Visit | Attending: Family Medicine | Admitting: Family Medicine

## 2015-10-27 DIAGNOSIS — M79605 Pain in left leg: Secondary | ICD-10-CM

## 2015-10-27 DIAGNOSIS — R0989 Other specified symptoms and signs involving the circulatory and respiratory systems: Secondary | ICD-10-CM

## 2015-10-27 DIAGNOSIS — M79604 Pain in right leg: Secondary | ICD-10-CM

## 2015-11-03 ENCOUNTER — Other Ambulatory Visit: Payer: Self-pay | Admitting: *Deleted

## 2015-11-03 MED ORDER — MONTELUKAST SODIUM 10 MG PO TABS: 10.0000 mg | ORAL_TABLET | Freq: Every day | ORAL | Status: DC

## 2015-11-08 ENCOUNTER — Other Ambulatory Visit: Payer: Self-pay | Admitting: *Deleted

## 2015-11-08 DIAGNOSIS — M79606 Pain in leg, unspecified: Secondary | ICD-10-CM

## 2015-11-10 ENCOUNTER — Encounter: Payer: Self-pay | Admitting: Family

## 2015-11-20 ENCOUNTER — Encounter: Payer: Self-pay | Admitting: Surgery

## 2015-11-20 ENCOUNTER — Ambulatory Visit (INDEPENDENT_AMBULATORY_CARE_PROVIDER_SITE_OTHER): Payer: Medicare Other | Admitting: Surgery

## 2015-11-20 ENCOUNTER — Ambulatory Visit (HOSPITAL_COMMUNITY)
Admission: RE | Admit: 2015-11-20 | Discharge: 2015-11-20 | Disposition: A | Discharge status: Home / Self Care | Payer: Medicare Other | Source: Ambulatory Visit | Attending: Surgery | Admitting: Surgery

## 2015-11-20 VITALS — BP 173/65 | HR 68 | Temp 97.1°F | Resp 16 | Ht 59.5 in | Wt 195.0 lb

## 2015-11-20 DIAGNOSIS — E1122 Type 2 diabetes mellitus with diabetic chronic kidney disease: Secondary | ICD-10-CM | POA: Insufficient documentation

## 2015-11-20 DIAGNOSIS — M25562 Pain in left knee: Secondary | ICD-10-CM | POA: Diagnosis not present

## 2015-11-20 DIAGNOSIS — I129 Hypertensive chronic kidney disease with stage 1 through stage 4 chronic kidney disease, or unspecified chronic kidney disease: Secondary | ICD-10-CM | POA: Insufficient documentation

## 2015-11-20 DIAGNOSIS — N189 Chronic kidney disease, unspecified: Secondary | ICD-10-CM | POA: Diagnosis not present

## 2015-11-20 DIAGNOSIS — M25561 Pain in right knee: Secondary | ICD-10-CM | POA: Diagnosis not present

## 2015-11-20 DIAGNOSIS — M79606 Pain in leg, unspecified: Secondary | ICD-10-CM | POA: Diagnosis present

## 2015-11-20 NOTE — Progress Notes (Signed)
Patient name: Ann Sellers MRN: [id number] DOB: 08/10/1925 Sex: female   Referred by: Dr. Chapman Fitch  Reason for referral:  Chief Complaint  Patient presents with  . New Evaluation    Ref by Dr. Sheryn Bison  C/O  Leg pain, diminished pulse, duration 1 plus yrs.  Pain level 10, w/cold feeling.    HISTORY OF PRESENT ILLNESS:  this is an  82 year old female who comes in today for evaluation of leg pain.  She states that her legs feel like tree stumps.  She describes an occasional sharp pain from her toes radiating upward.  This is not associated with activity.  It is associated with cold spells.  She describes a feeling as if forearm cervical crawling up her legs.  Sometimes it feels like a shock.  She also endorses swelling and drainage from her legs.  She states that she has been treated several times for cellulitis since October with antibiotics.   the patient suffers from diabetes.  The most recent hemoglobin A1c I have available is a 7.7 and 2015. She is medically managed for hypertension. She is a former smoker.  Past Medical History  Diagnosis Date  . B12 deficiency   . Asthma   . Diabetes mellitus ([place])   . Hypertension   . Arthritis   . Neuromuscular disorder ([place])   . Wears glasses   . Wears dentures     top denture-bottom partial  . Wears hearing aid     both ears  . Right rotator cuff tear   . Anemia   . Chronic kidney disease     Past Surgical History  Procedure Laterality Date  . Tonsillectomy  1936  . Appendectomy  1945  . Vesicovaginal fistula closure w/ tah  1968  . Herniated disc  1983  . Tympanostomy tube placement  1989  . Foot neuroma surgery  1994  . Left knee surgery  1998  . Esophageal stretch  2001  . Left knee replacement  2002  . Cervical stynosis  2005  . Right wrist fracture  2006  . Cataract extraction, bilateral  2008  . Left hand surgery  2011    gout  . Tonsillectomy    . Shoulder arthroscopy with rotator cuff repair Right 01/05/2013   Procedure: SHOULDER ARTHROSCOPY WITH ROTATOR CUFF REPAIR subchromial decompression and distal clavical excision;  Surgeon: Lorn Junes, MD;  Location: [facility];  Service: Orthopedics;  Laterality: Right;    Social History   Social History  . Marital Status: Widowed    Spouse Name: N/A  . Number of Children: 6  . Years of Education: N/A   Occupational History  . homemaker    Social History Main Topics  . Smoking status: Former Smoker -- 0.50 packs/day for 20 years    Types: Cigarettes    Quit date: 10/22/1987  . Smokeless tobacco: Never Used  . Alcohol Use: 4.2 oz/week    7 Glasses of wine per week     Comment: social drinker  . Drug Use: No  . Sexual Activity: Not on file   Other Topics Concern  . Not on file   Social History Narrative    Family History  Problem Relation Age of Onset  . Pneumonia Mother   . Heart disease Sister   . Hypertension Sister   . Diabetes Sister   . Hypertension Sister   . Diabetes Daughter   . Diabetes Son   . Hypertension Son  Allergies as of 11/20/2015 - Review Complete 11/20/2015  Allergen Reaction Noted  . Snake antivenin [antivenin crotalidae polyvalent (snake antivenin)] Other (See Comments) 07/11/2011    Current Outpatient Prescriptions on File Prior to Visit  Medication Sig Dispense Refill  . albuterol (PROVENTIL HFA;VENTOLIN HFA) 108 (90 BASE) MCG/ACT inhaler Inhale 2 puffs into the lungs every 6 (six) hours as needed.      Marland Kitchen albuterol (PROVENTIL) (2.5 MG/3ML) 0.083% nebulizer solution Take 2.5 mg by nebulization every 6 (six) hours as needed for wheezing or shortness of breath.    . allopurinol (ZYLOPRIM) 100 MG tablet Take 100 mg by mouth daily.      . calcitRIOL (ROCALTROL) 0.25 MCG capsule Take 1 capsule by mouth daily.  1  . diphenoxylate-atropine (LOMOTIL) 2.5-0.025 MG per tablet Take 1 tablet by mouth daily as needed for diarrhea or loose stools.    . fexofenadine (ALLEGRA) 180 MG tablet take 1  tablet by mouth once daily 90 tablet 1  . furosemide (LASIX) 20 MG tablet Take 40 mg by mouth daily.     Marland Kitchen gabapentin (NEURONTIN) 300 MG capsule Take 300 mg by mouth 3 (three) times daily.    Marland Kitchen HYDROcodone-acetaminophen (NORCO/VICODIN) 5-325 MG per tablet 1/2 to 1 every 6 hours as needed  0  . insulin glargine (LANTUS) 100 UNIT/ML injection Inject 20 Units into the skin at bedtime.     . metolazone (ZAROXOLYN) 2.5 MG tablet Take 2.5 mg by mouth daily as needed.    . mometasone (NASONEX) 50 MCG/ACT nasal spray Place 2 sprays into the nose daily.     . mometasone-formoterol (DULERA) 100-5 MCG/ACT AERO Take 2 puffs first thing in am and then another 2 puffs about 12 hours later. 1 Inhaler 11  . omeprazole (PRILOSEC) 40 MG capsule Take 1 capsule (40 mg total) by mouth 2 (two) times daily. 60 capsule 11  . tiZANidine (ZANAFLEX) 4 MG tablet Take 4 mg by mouth at bedtime.    . montelukast (SINGULAIR) 10 MG tablet Take 1 tablet (10 mg total) by mouth at bedtime. (Patient not taking: Reported on 11/20/2015) 90 tablet 1  . Multiple Vitamins-Minerals (ICAPS MV) TABS Take 1 tablet by mouth daily. Reported on 11/20/2015    . Respiratory Therapy Supplies (FLUTTER) DEVI Use as directed (Patient not taking: Reported on 11/20/2015) 1 each 0  . sitaGLIPtin (JANUVIA) 100 MG tablet Take 100 mg by mouth daily. Reported on 11/20/2015    . vitamin E 400 UNIT capsule Take 400 Units by mouth daily. Reported on 11/20/2015     No current facility-administered medications on file prior to visit.     REVIEW OF SYSTEMS: Cardiovascular: No chest pain, chest pressure,  Pulmonary: No productive cough, asthma or wheezing. Neurologic: No weakness, paresthesias, aphasia, or amaurosis. No dizziness. Hematologic: No bleeding problems or clotting disorders. Musculoskeletal: No joint pain or joint swelling. Gastrointestinal: No blood in stool or hematemesis Genitourinary: No dysuria or hematuria. Psychiatric:: No history of major  depression. Integumentary: No rashes or ulcers. Constitutional: No fever or chills.  PHYSICAL EXAMINATION:  Filed Vitals:   11/20/15 1059 11/20/15 1107  BP: 183/70 173/65  Pulse: 66 68  Temp: 97.1 F (36.2 C)   TempSrc: Oral   Resp: 16   Height: 4' 11.5" (1.511 m)   Weight: 195 lb (88.451 kg)   SpO2: 98%    Body mass index is 38.74 kg/(m^2). General: The patient appears their stated age.   HEENT:  No gross abnormalities Pulmonary: Respirations are  non-labored Abdomen: Soft and non-tender  Musculoskeletal: There are no major deformities.   Neurologic: No focal weakness or paresthesias are detected, Skin: There are no ulcer or rashes noted. Psychiatric: The patient has normal affect. Cardiovascular: There is a regular rate and rhythm without significant murmur appreciated. Pedal pulses are not palpable.  Pitting edema 1+ bilaterally  Diagnostic Studies:  the patient has had venous reflux studies done in 2015.  These were unremarkable.  I have reviewed her duplex of the lower extremity.  She has biphasic waveforms throughout her legs with no hemodynamically significant stenosis  Assessment:   bilateral leg pain Plan:  I discussed with the patient that I do not feel that her symptoms are related to arterial insufficiency.  This is based on the description of her complaints as well as her duplex ultrasound which shows no significant stenosis.  In addition, I feel that her symptoms are also not related to venous insufficiency, as she has had a ultrasound in 2015 which was negative for reflux.   The patient does suffer from pitting edema which is most likely secondary to chronic lymphedema.  I stressed the importance of leg elevation and compression.  We discussed using 15-46mmHg  As opposed to 20-30.  This is because she cannot put the stockings on.  I also told her that wearing them occasionally would be better than not wearing them.  She just has difficulty reaching her toes to put  the socks on.   Again I do not think that the patient's symptoms are related to arterial or venous etiology. She would benefit from compression if she could wear them.  Elevation will help. She will follow-up with me on an as-needed basis     V. Leia Alf, M.D. [facility] Office: [phone] Pager:  [phone]

## 2015-11-20 NOTE — Progress Notes (Signed)
Filed Vitals:   11/20/15 1059 11/20/15 1107  BP: 183/70 173/65  Pulse: 66 68  Temp: 97.1 F (36.2 C)   TempSrc: Oral   Resp: 16   Height: 4' 11.5" (1.511 m)   Weight: 195 lb (88.451 kg)   SpO2: 98%

## 2015-11-23 ENCOUNTER — Encounter: Payer: Self-pay | Admitting: Internal Medicine

## 2015-11-23 ENCOUNTER — Ambulatory Visit (INDEPENDENT_AMBULATORY_CARE_PROVIDER_SITE_OTHER): Payer: Medicare Other | Admitting: Internal Medicine

## 2015-11-23 VITALS — BP 134/66 | HR 81 | Ht 59.5 in | Wt 194.0 lb

## 2015-11-23 DIAGNOSIS — J4550 Severe persistent asthma, uncomplicated: Secondary | ICD-10-CM

## 2015-11-23 MED ORDER — OMEPRAZOLE 20 MG PO CPDR: DELAYED_RELEASE_CAPSULE | ORAL | Status: DC

## 2015-11-23 MED ORDER — OMEPRAZOLE 40 MG PO CPDR: DELAYED_RELEASE_CAPSULE | ORAL | Status: DC

## 2015-11-23 NOTE — Progress Notes (Signed)
Subjective:    Patient ID: Ann Sellers, female    DOB: 27-Feb-1925, 82 y.o.   MRN: [id number]  Brief patient profile:  82 year old woman, former minimal smoker quit in 1980s  with a history of childhood asthma, who developed chest tightness, dry cough and dyspnea again as an adult. Has been treated as recurrent asthma in the past with LABA/ICS,   Dr Harold Hedge eval 03/15/15 FeNO was nl and IgE 5 rec start high dose symbicort / spiriva  For possible ACOS with spirometry FEV1  0.82 (59%) ratio 58  Dr Lamonte Sakai ov  ROV 05/16/15 -- patient follows up for chronic cough that has components consistent with upper airway irritation syndrome although she does carry a history of chronic asthma. At her last visit we continued to treat GERD and rhinitis, we stopped Spiriva and losartan as potential contributors to call. We also added a spacer for her Symbicort. She has been skipping the symbicort for last 2 weeks, didn't miss it. She has been doing well until today - she had to do some outdoor work 2 days prior to OV   dust and pollen exposure.  rec Please stay off spiriva, stop your symbicort Use albuterol 2 puffs if needed for shortness of breath Continue singulair, nasonex, omeprazole    07/06/2015 acute extended ov/Ann Sellers re: refractory chronic cough  Chief Complaint  Patient presents with  . Acute Visit    Pt c/o cough x 1 wk- prod with grey to green sputum.   Cough "all her life" very confused with details of care except for the hx that every time she takes prednisone she is quite a bit better. Cough is more day than noct,  Starts After stirs in am, not typically waking her once asleep  rec Prednisone Take 4 for two days three for two days two for two days one for two days Stop symbicort and start dulera 100 Take 2 puffs first thing in am and then another 2 puffs about 12 hours later.  Work on inhaler technique:     Stop boniva Increase omeprazole to Take 30- 60 min before your first and last  meals of the day GERD  Diet  Please schedule a follow up office visit in 2 weeks, sooner if needed with all meds in hand in 2 bags - automatics vs as needed (remember bag# 2 is up to you!)   07/24/2015  f/u ov/Ann Sellers re: refractory chronic cough / did not bring meds  Chief Complaint  Patient presents with  . Follow-up    Pt states her cough had resolved while on pred, and then started back when finished. Cough is non prod at this time.   Continues mostly daytime coughing fits rec Continue  dulera 100 Take 2 puffs first thing in am and then another 2 puffs about 12 hours later.  Work on inhaler technique:  Cough into flutter valve as much as possible and purse lip breathing with exercise. If cough worse >> Prednisone 10 mg Take 4 for two days three for two days two for two days one for two days and stop    08/22/2015  f/u ov/Ann Sellers re: uacs vs asthma / did not bring meds ? Still on dulera 200?  Chief Complaint  Patient presents with  . Follow-up    Pt states that her cough has resolved. She is still clearing her throat often.   Not limited by breathing from desired activities  But by weakness/ unsteady on feet  [facility]  to reduce the dulera 100 Take 2 puffs first thing in am and then another 2 puffs about 12 hours later if needed  Only use your albuterol as a rescue medication Continue prilosec 40 mg Take 30- 60 min before your first and last meals of the day  Work on inhaler technique:      11/23/2015  f/u ov/Ann Sellers re:  uacs vs cough variant asthma on dulera 100 2bid / gerd rxx Chief Complaint  Patient presents with  . Follow-up    Doing well with no cough. No new co's today.  Not limited by breathing from desired activities  / still weak and unsteady on feet  No obvious patterns in day to day or daytime variability or assoc sob or  cp or chest tightness, subjective wheeze  or hb symptoms. No unusual exp hx or h/o childhood pna/ asthma or knowledge of premature birth.  Sleeping ok without  nocturnal  or early am exacerbation  of respiratory  c/o's or need for noct saba. Also denies any obvious fluctuation of symptoms with weather or environmental changes or other aggravating or alleviating factors except as outlined above   Current Medications, Allergies, Complete Past Medical History, Past Surgical History, Family History, and Social History were reviewed in [employer] record.  ROS  The following are not active complaints unless bolded sore throat, dysphagia, dental problems, itching, sneezing,  nasal congestion or excess/ purulent secretions, ear ache,   fever, chills, sweats, unintended wt loss, classically pleuritic or exertional cp, hemoptysis,  orthopnea pnd or leg swelling, presyncope, palpitations, abdominal pain, anorexia, nausea, vomiting, diarrhea  or change in bowel or bladder habits, change in stools or urine, dysuria,hematuria,  rash, arthralgias, visual complaints, headache, numbness, weakness or ataxia or problems with walking or coordination,  change in mood/affect or memory.                   Objective:   Physical Exa  Gen: Pleasant, obese elderly woman, in no distress,  normal affect / struggles to get on exam table with one person assist     07/24/2015        193 > 11/23/2015    194     07/06/15 190 lb (86.183 kg)  05/16/15 192 lb (87.091 kg)  03/29/15 198 lb (89.812 kg)    Vital signs reviewed   ENT: No lesions,  mouth clear,  oropharynx clear, no postnasal drip, some upper airway noise  Neck: No JVD, no TMG, no carotid bruits  Lungs:  Completely clear with purse lip breathing   Cardiovascular: RRR, heart sounds normal, no murmur or gallops, trace peripheral edema  Musculoskeletal: No deformities, no cyanosis or clubbing  Neuro: alert, non focal  Skin: Warm, no lesions or rashes        I personally reviewed images and agree with radiology impression as follows:  CXR:   03/07/15   No acute cardiopulmonary  disease       Assessment & Plan:    Outpatient Encounter Prescriptions as of 11/23/2015  Medication Sig  . albuterol (PROVENTIL HFA;VENTOLIN HFA) 108 (90 BASE) MCG/ACT inhaler Inhale 2 puffs into the lungs every 6 (six) hours as needed.    Marland Kitchen albuterol (PROVENTIL) (2.5 MG/3ML) 0.083% nebulizer solution Take 2.5 mg by nebulization every 6 (six) hours as needed for wheezing or shortness of breath.  . allopurinol (ZYLOPRIM) 100 MG tablet Take 100 mg by mouth daily.    . calcitRIOL (ROCALTROL) 0.25 MCG capsule Take  1 capsule by mouth daily.  . Cyanocobalamin (VITAMIN B 12 PO) Take by mouth. Injection once monthly for Anemia  . diphenoxylate-atropine (LOMOTIL) 2.5-0.025 MG per tablet Take 1 tablet by mouth daily as needed for diarrhea or loose stools.  . fexofenadine (ALLEGRA) 180 MG tablet take 1 tablet by mouth once daily  . furosemide (LASIX) 20 MG tablet Take 40 mg by mouth daily.   Marland Kitchen gabapentin (NEURONTIN) 300 MG capsule Take 300 mg by mouth 3 (three) times daily.  Marland Kitchen HYDROcodone-acetaminophen (NORCO/VICODIN) 5-325 MG per tablet 1/2 to 1 every 6 hours as needed  . insulin glargine (LANTUS) 100 UNIT/ML injection Inject 20 Units into the skin at bedtime.   . metolazone (ZAROXOLYN) 2.5 MG tablet Take 2.5 mg by mouth daily as needed.  . mometasone (NASONEX) 50 MCG/ACT nasal spray Place 2 sprays into the nose daily.   . mometasone-formoterol (DULERA) 100-5 MCG/ACT AERO Take 2 puffs first thing in am and then another 2 puffs about 12 hours later.  . Multiple Vitamins-Minerals (ICAPS MV) TABS Take 1 tablet by mouth daily. Reported on 11/20/2015  . omeprazole (PRILOSEC) 40 MG capsule Take 30-60 min before first meal of the day  . Respiratory Therapy Supplies (FLUTTER) DEVI Use as directed  . sitaGLIPtin (JANUVIA) 100 MG tablet Take 100 mg by mouth daily. Reported on 11/20/2015  . tiZANidine (ZANAFLEX) 4 MG tablet Take 4 mg by mouth at bedtime.  . [DISCONTINUED] omeprazole (PRILOSEC) 40 MG capsule Take  1 capsule (40 mg total) by mouth 2 (two) times daily. (Patient taking differently: Take 40 mg by mouth daily. )  . [DISCONTINUED] montelukast (SINGULAIR) 10 MG tablet Take 1 tablet (10 mg total) by mouth at bedtime. (Patient not taking: Reported on 11/20/2015)  . [DISCONTINUED] omeprazole (PRILOSEC) 20 MG capsule Take 30-60 min before last  meal of the day  . [DISCONTINUED] vitamin E 400 UNIT capsule Take 400 Units by mouth daily. Reported on 11/20/2015   No facility-administered encounter medications on file as of 11/23/2015.

## 2015-11-23 NOTE — Patient Instructions (Signed)
Continue dulera 100 2 puffs each am and another 2 pffs in pm only if having problems with breathing or coughing  Work on inhaler technique:  relax and gently blow all the way out then take a nice smooth deep breath back in, triggering the inhaler at same time you start breathing in.  Hold for up to 5 seconds if you can. Blow out thru nose. Rinse and gargle with water when done      If you are satisfied with your treatment plan,  let your doctor know and he/she can either refill your medications or you can return here when your prescription runs out.     If in any way you are not 100% satisfied,  please tell us.  If 100% better, tell your friends!  Pulmonary follow up is as needed

## 2015-11-24 ENCOUNTER — Telehealth: Payer: Self-pay | Admitting: Internal Medicine

## 2015-11-24 NOTE — Telephone Encounter (Signed)
ATC received fast busy signal. [facility]

## 2015-11-27 MED ORDER — OMEPRAZOLE 20 MG PO CPDR: DELAYED_RELEASE_CAPSULE | ORAL | Status: DC

## 2015-11-27 NOTE — Telephone Encounter (Signed)
Spoke with pt, states she needs her 20mg  omeprazole rx refaxed to pharmacy.  This has been sent. Nothing further needed.

## 2015-12-04 ENCOUNTER — Encounter: Payer: Self-pay | Admitting: Internal Medicine

## 2015-12-04 NOTE — Assessment & Plan Note (Addendum)
Dr Harold Hedge eval 03/15/15 FeNO was nl and IgE 5 rec start high dose symbicort / spiriva  For possible ACOS with spirometry FEV1  0.82 (59%) ratio 58 > no better on spirva so stopped - 07/24/2015   try dulera 100 2bid ? Still on the 200  - 08/22/2015   try dulera 100 2bid  - 11/23/2015  extensive coaching HFA effectiveness =    75%   All goals of chronic asthma control met including optimal function and elimination of symptoms with minimal need for rescue therapy.  Contingencies discussed in full including contacting this office immediately if not controlling the symptoms using the rule of two's.     Ok to try just the dulera 100 2 pffs each am to see if does as well but if not add the pm dose back  I had an extended final summary discussion with the patient reviewing all relevant studies completed to date and  lasting 15 to 20 minutes of a 25 minute visit on the following issues:    - The proper method of use, as well as anticipated side effects, of a metered-dose inhaler are discussed and demonstrated to the patient. Improved effectiveness after extensive coaching during this visit to a level of approximately 75 % from a baseline of 50 %   - If  hfa technique starts to slip back down next step would be to offer her ICS/LABA neb but would avoid the dpi's in pt with tendency to so much coughing historically   Each maintenance medication was reviewed in detail including most importantly the difference between maintenance and as needed and under what circumstances the prns are to be used.  Please see instructions for details which were reviewed in writing and the patient given a copy.

## 2015-12-05 ENCOUNTER — Other Ambulatory Visit: Payer: Self-pay | Admitting: *Deleted

## 2015-12-05 MED ORDER — FEXOFENADINE HCL 180 MG PO TABS: 180.0000 mg | ORAL_TABLET | Freq: Every day | ORAL | Status: DC

## 2015-12-20 DIAGNOSIS — Z85820 Personal history of malignant melanoma of skin: Secondary | ICD-10-CM | POA: Diagnosis not present

## 2015-12-20 DIAGNOSIS — Z08 Encounter for follow-up examination after completed treatment for malignant neoplasm: Secondary | ICD-10-CM | POA: Diagnosis not present

## 2015-12-20 DIAGNOSIS — Z1283 Encounter for screening for malignant neoplasm of skin: Secondary | ICD-10-CM | POA: Diagnosis not present

## 2015-12-20 DIAGNOSIS — I872 Venous insufficiency (chronic) (peripheral): Secondary | ICD-10-CM | POA: Diagnosis not present

## 2015-12-29 DIAGNOSIS — Z79899 Other long term (current) drug therapy: Secondary | ICD-10-CM | POA: Diagnosis not present

## 2015-12-29 DIAGNOSIS — L03114 Cellulitis of left upper limb: Secondary | ICD-10-CM | POA: Diagnosis not present

## 2015-12-29 DIAGNOSIS — E785 Hyperlipidemia, unspecified: Secondary | ICD-10-CM | POA: Diagnosis not present

## 2015-12-29 DIAGNOSIS — E1122 Type 2 diabetes mellitus with diabetic chronic kidney disease: Secondary | ICD-10-CM | POA: Diagnosis not present

## 2015-12-29 DIAGNOSIS — D631 Anemia in chronic kidney disease: Secondary | ICD-10-CM | POA: Diagnosis not present

## 2015-12-29 DIAGNOSIS — R8290 Unspecified abnormal findings in urine: Secondary | ICD-10-CM | POA: Diagnosis not present

## 2015-12-29 DIAGNOSIS — J4520 Mild intermittent asthma, uncomplicated: Secondary | ICD-10-CM | POA: Diagnosis not present

## 2015-12-29 DIAGNOSIS — N184 Chronic kidney disease, stage 4 (severe): Secondary | ICD-10-CM | POA: Diagnosis not present

## 2015-12-29 DIAGNOSIS — E1140 Type 2 diabetes mellitus with diabetic neuropathy, unspecified: Secondary | ICD-10-CM | POA: Diagnosis not present

## 2015-12-29 DIAGNOSIS — E538 Deficiency of other specified B group vitamins: Secondary | ICD-10-CM | POA: Diagnosis not present

## 2016-01-08 DIAGNOSIS — Z87440 Personal history of urinary (tract) infections: Secondary | ICD-10-CM | POA: Diagnosis not present

## 2016-02-19 DIAGNOSIS — I10 Essential (primary) hypertension: Secondary | ICD-10-CM | POA: Diagnosis not present

## 2016-02-19 DIAGNOSIS — D631 Anemia in chronic kidney disease: Secondary | ICD-10-CM | POA: Diagnosis not present

## 2016-02-19 DIAGNOSIS — N183 Chronic kidney disease, stage 3 (moderate): Secondary | ICD-10-CM | POA: Diagnosis not present

## 2016-02-19 DIAGNOSIS — N189 Chronic kidney disease, unspecified: Secondary | ICD-10-CM | POA: Diagnosis not present

## 2016-02-19 DIAGNOSIS — N2581 Secondary hyperparathyroidism of renal origin: Secondary | ICD-10-CM | POA: Diagnosis not present

## 2016-02-29 DIAGNOSIS — M25522 Pain in left elbow: Secondary | ICD-10-CM | POA: Diagnosis not present

## 2016-02-29 DIAGNOSIS — M1711 Unilateral primary osteoarthritis, right knee: Secondary | ICD-10-CM | POA: Diagnosis not present

## 2016-04-25 ENCOUNTER — Other Ambulatory Visit: Payer: Self-pay | Admitting: *Deleted

## 2016-04-25 MED ORDER — FEXOFENADINE HCL 180 MG PO TABS: 180.0000 mg | ORAL_TABLET | Freq: Every day | ORAL | Status: DC

## 2016-05-01 DIAGNOSIS — E785 Hyperlipidemia, unspecified: Secondary | ICD-10-CM | POA: Diagnosis not present

## 2016-05-01 DIAGNOSIS — E1140 Type 2 diabetes mellitus with diabetic neuropathy, unspecified: Secondary | ICD-10-CM | POA: Diagnosis not present

## 2016-05-01 DIAGNOSIS — D649 Anemia, unspecified: Secondary | ICD-10-CM | POA: Diagnosis not present

## 2016-05-01 DIAGNOSIS — M109 Gout, unspecified: Secondary | ICD-10-CM | POA: Diagnosis not present

## 2016-05-01 DIAGNOSIS — I10 Essential (primary) hypertension: Secondary | ICD-10-CM | POA: Diagnosis not present

## 2016-05-01 DIAGNOSIS — J4520 Mild intermittent asthma, uncomplicated: Secondary | ICD-10-CM | POA: Diagnosis not present

## 2016-05-01 DIAGNOSIS — Z79899 Other long term (current) drug therapy: Secondary | ICD-10-CM | POA: Diagnosis not present

## 2016-05-01 DIAGNOSIS — M170 Bilateral primary osteoarthritis of knee: Secondary | ICD-10-CM | POA: Diagnosis not present

## 2016-05-01 DIAGNOSIS — E1121 Type 2 diabetes mellitus with diabetic nephropathy: Secondary | ICD-10-CM | POA: Diagnosis not present

## 2016-05-01 DIAGNOSIS — E538 Deficiency of other specified B group vitamins: Secondary | ICD-10-CM | POA: Diagnosis not present

## 2016-05-03 DIAGNOSIS — E1121 Type 2 diabetes mellitus with diabetic nephropathy: Secondary | ICD-10-CM | POA: Diagnosis not present

## 2016-05-28 DIAGNOSIS — J45998 Other asthma: Secondary | ICD-10-CM | POA: Diagnosis not present

## 2016-05-31 ENCOUNTER — Other Ambulatory Visit: Payer: Self-pay | Admitting: Emergency Medicine

## 2016-06-04 DIAGNOSIS — H26492 Other secondary cataract, left eye: Secondary | ICD-10-CM | POA: Diagnosis not present

## 2016-06-04 DIAGNOSIS — H26491 Other secondary cataract, right eye: Secondary | ICD-10-CM | POA: Diagnosis not present

## 2016-06-04 DIAGNOSIS — H18411 Arcus senilis, right eye: Secondary | ICD-10-CM | POA: Diagnosis not present

## 2016-06-04 DIAGNOSIS — Z961 Presence of intraocular lens: Secondary | ICD-10-CM | POA: Diagnosis not present

## 2016-06-19 ENCOUNTER — Other Ambulatory Visit: Payer: Self-pay | Admitting: Emergency Medicine

## 2016-07-26 DIAGNOSIS — E1121 Type 2 diabetes mellitus with diabetic nephropathy: Secondary | ICD-10-CM | POA: Diagnosis not present

## 2016-08-03 ENCOUNTER — Other Ambulatory Visit: Payer: Self-pay | Admitting: Internal Medicine

## 2016-08-12 DIAGNOSIS — Z23 Encounter for immunization: Secondary | ICD-10-CM | POA: Diagnosis not present

## 2016-09-01 ENCOUNTER — Other Ambulatory Visit: Payer: Self-pay | Admitting: Internal Medicine

## 2016-09-03 DIAGNOSIS — D631 Anemia in chronic kidney disease: Secondary | ICD-10-CM | POA: Diagnosis not present

## 2016-09-03 DIAGNOSIS — I10 Essential (primary) hypertension: Secondary | ICD-10-CM | POA: Diagnosis not present

## 2016-09-03 DIAGNOSIS — M1A9XX0 Chronic gout, unspecified, without tophus (tophi): Secondary | ICD-10-CM | POA: Diagnosis not present

## 2016-09-03 DIAGNOSIS — R011 Cardiac murmur, unspecified: Secondary | ICD-10-CM | POA: Diagnosis not present

## 2016-09-03 DIAGNOSIS — R531 Weakness: Secondary | ICD-10-CM | POA: Diagnosis not present

## 2016-09-03 DIAGNOSIS — E1140 Type 2 diabetes mellitus with diabetic neuropathy, unspecified: Secondary | ICD-10-CM | POA: Diagnosis not present

## 2016-09-03 DIAGNOSIS — K219 Gastro-esophageal reflux disease without esophagitis: Secondary | ICD-10-CM | POA: Diagnosis not present

## 2016-09-03 DIAGNOSIS — N183 Chronic kidney disease, stage 3 (moderate): Secondary | ICD-10-CM | POA: Diagnosis not present

## 2016-09-03 DIAGNOSIS — N2581 Secondary hyperparathyroidism of renal origin: Secondary | ICD-10-CM | POA: Diagnosis not present

## 2016-09-03 DIAGNOSIS — M810 Age-related osteoporosis without current pathological fracture: Secondary | ICD-10-CM | POA: Diagnosis not present

## 2016-09-09 DIAGNOSIS — N183 Chronic kidney disease, stage 3 (moderate): Secondary | ICD-10-CM | POA: Diagnosis not present

## 2016-09-09 DIAGNOSIS — I10 Essential (primary) hypertension: Secondary | ICD-10-CM | POA: Diagnosis not present

## 2016-09-09 DIAGNOSIS — N2581 Secondary hyperparathyroidism of renal origin: Secondary | ICD-10-CM | POA: Diagnosis not present

## 2016-09-09 DIAGNOSIS — Z6841 Body Mass Index (BMI) 40.0 and over, adult: Secondary | ICD-10-CM | POA: Diagnosis not present

## 2016-09-09 DIAGNOSIS — D631 Anemia in chronic kidney disease: Secondary | ICD-10-CM | POA: Diagnosis not present

## 2016-09-12 ENCOUNTER — Emergency Department (HOSPITAL_COMMUNITY): Payer: Medicare Other

## 2016-09-12 ENCOUNTER — Inpatient Hospital Stay (HOSPITAL_COMMUNITY)
Admission: EM | Admit: 2016-09-12 | Discharge: 2016-09-17 | DRG: 291 | Disposition: A | Discharge status: Home Health | Payer: Medicare Other | Attending: Internal Medicine | Admitting: Internal Medicine

## 2016-09-12 ENCOUNTER — Encounter (HOSPITAL_COMMUNITY): Payer: Self-pay | Admitting: Emergency Medicine

## 2016-09-12 DIAGNOSIS — N183 Chronic kidney disease, stage 3 (moderate): Secondary | ICD-10-CM | POA: Diagnosis present

## 2016-09-12 DIAGNOSIS — R200 Anesthesia of skin: Secondary | ICD-10-CM | POA: Diagnosis not present

## 2016-09-12 DIAGNOSIS — I5033 Acute on chronic diastolic (congestive) heart failure: Secondary | ICD-10-CM | POA: Diagnosis not present

## 2016-09-12 DIAGNOSIS — R531 Weakness: Secondary | ICD-10-CM | POA: Diagnosis not present

## 2016-09-12 DIAGNOSIS — I6789 Other cerebrovascular disease: Secondary | ICD-10-CM | POA: Diagnosis not present

## 2016-09-12 DIAGNOSIS — R05 Cough: Secondary | ICD-10-CM | POA: Diagnosis present

## 2016-09-12 DIAGNOSIS — D649 Anemia, unspecified: Secondary | ICD-10-CM | POA: Diagnosis present

## 2016-09-12 DIAGNOSIS — R29818 Other symptoms and signs involving the nervous system: Secondary | ICD-10-CM | POA: Diagnosis not present

## 2016-09-12 DIAGNOSIS — E119 Type 2 diabetes mellitus without complications: Secondary | ICD-10-CM

## 2016-09-12 DIAGNOSIS — E1165 Type 2 diabetes mellitus with hyperglycemia: Secondary | ICD-10-CM | POA: Diagnosis present

## 2016-09-12 DIAGNOSIS — J9601 Acute respiratory failure with hypoxia: Secondary | ICD-10-CM | POA: Diagnosis not present

## 2016-09-12 DIAGNOSIS — Z8249 Family history of ischemic heart disease and other diseases of the circulatory system: Secondary | ICD-10-CM

## 2016-09-12 DIAGNOSIS — I130 Hypertensive heart and chronic kidney disease with heart failure and stage 1 through stage 4 chronic kidney disease, or unspecified chronic kidney disease: Secondary | ICD-10-CM | POA: Diagnosis not present

## 2016-09-12 DIAGNOSIS — Z974 Presence of external hearing-aid: Secondary | ICD-10-CM

## 2016-09-12 DIAGNOSIS — R29898 Other symptoms and signs involving the musculoskeletal system: Secondary | ICD-10-CM | POA: Diagnosis not present

## 2016-09-12 DIAGNOSIS — N179 Acute kidney failure, unspecified: Secondary | ICD-10-CM | POA: Diagnosis not present

## 2016-09-12 DIAGNOSIS — I248 Other forms of acute ischemic heart disease: Secondary | ICD-10-CM | POA: Diagnosis present

## 2016-09-12 DIAGNOSIS — Z96652 Presence of left artificial knee joint: Secondary | ICD-10-CM | POA: Diagnosis present

## 2016-09-12 DIAGNOSIS — Z6841 Body Mass Index (BMI) 40.0 and over, adult: Secondary | ICD-10-CM

## 2016-09-12 DIAGNOSIS — I4891 Unspecified atrial fibrillation: Secondary | ICD-10-CM

## 2016-09-12 DIAGNOSIS — M4802 Spinal stenosis, cervical region: Secondary | ICD-10-CM | POA: Diagnosis not present

## 2016-09-12 DIAGNOSIS — I480 Paroxysmal atrial fibrillation: Secondary | ICD-10-CM | POA: Diagnosis present

## 2016-09-12 DIAGNOSIS — R5381 Other malaise: Secondary | ICD-10-CM | POA: Diagnosis present

## 2016-09-12 DIAGNOSIS — D329 Benign neoplasm of meninges, unspecified: Secondary | ICD-10-CM | POA: Diagnosis present

## 2016-09-12 DIAGNOSIS — T380X5A Adverse effect of glucocorticoids and synthetic analogues, initial encounter: Secondary | ICD-10-CM | POA: Diagnosis present

## 2016-09-12 DIAGNOSIS — Z794 Long term (current) use of insulin: Secondary | ICD-10-CM

## 2016-09-12 DIAGNOSIS — J45909 Unspecified asthma, uncomplicated: Secondary | ICD-10-CM | POA: Diagnosis present

## 2016-09-12 DIAGNOSIS — J181 Lobar pneumonia, unspecified organism: Secondary | ICD-10-CM

## 2016-09-12 DIAGNOSIS — M109 Gout, unspecified: Secondary | ICD-10-CM | POA: Diagnosis present

## 2016-09-12 DIAGNOSIS — I5032 Chronic diastolic (congestive) heart failure: Secondary | ICD-10-CM | POA: Diagnosis present

## 2016-09-12 DIAGNOSIS — Z8673 Personal history of transient ischemic attack (TIA), and cerebral infarction without residual deficits: Secondary | ICD-10-CM

## 2016-09-12 DIAGNOSIS — J189 Pneumonia, unspecified organism: Secondary | ICD-10-CM | POA: Diagnosis present

## 2016-09-12 DIAGNOSIS — Z9114 Patient's other noncompliance with medication regimen: Secondary | ICD-10-CM

## 2016-09-12 DIAGNOSIS — E118 Type 2 diabetes mellitus with unspecified complications: Secondary | ICD-10-CM

## 2016-09-12 DIAGNOSIS — M6281 Muscle weakness (generalized): Secondary | ICD-10-CM | POA: Diagnosis not present

## 2016-09-12 DIAGNOSIS — M7989 Other specified soft tissue disorders: Secondary | ICD-10-CM | POA: Diagnosis not present

## 2016-09-12 DIAGNOSIS — I509 Heart failure, unspecified: Secondary | ICD-10-CM

## 2016-09-12 DIAGNOSIS — I10 Essential (primary) hypertension: Secondary | ICD-10-CM | POA: Diagnosis present

## 2016-09-12 DIAGNOSIS — N184 Chronic kidney disease, stage 4 (severe): Secondary | ICD-10-CM | POA: Diagnosis present

## 2016-09-12 DIAGNOSIS — R609 Edema, unspecified: Secondary | ICD-10-CM | POA: Diagnosis present

## 2016-09-12 DIAGNOSIS — M4800 Spinal stenosis, site unspecified: Secondary | ICD-10-CM | POA: Diagnosis present

## 2016-09-12 DIAGNOSIS — I350 Nonrheumatic aortic (valve) stenosis: Secondary | ICD-10-CM

## 2016-09-12 DIAGNOSIS — Z87891 Personal history of nicotine dependence: Secondary | ICD-10-CM

## 2016-09-12 DIAGNOSIS — R918 Other nonspecific abnormal finding of lung field: Secondary | ICD-10-CM | POA: Diagnosis not present

## 2016-09-12 DIAGNOSIS — E1122 Type 2 diabetes mellitus with diabetic chronic kidney disease: Secondary | ICD-10-CM | POA: Diagnosis present

## 2016-09-12 DIAGNOSIS — Z833 Family history of diabetes mellitus: Secondary | ICD-10-CM

## 2016-09-12 DIAGNOSIS — R059 Cough, unspecified: Secondary | ICD-10-CM | POA: Diagnosis present

## 2016-09-12 DIAGNOSIS — Z79899 Other long term (current) drug therapy: Secondary | ICD-10-CM

## 2016-09-12 DIAGNOSIS — M47812 Spondylosis without myelopathy or radiculopathy, cervical region: Secondary | ICD-10-CM | POA: Diagnosis not present

## 2016-09-12 DIAGNOSIS — R262 Difficulty in walking, not elsewhere classified: Secondary | ICD-10-CM

## 2016-09-12 DIAGNOSIS — Z981 Arthrodesis status: Secondary | ICD-10-CM

## 2016-09-12 DIAGNOSIS — M79609 Pain in unspecified limb: Secondary | ICD-10-CM | POA: Diagnosis not present

## 2016-09-12 LAB — I-STAT CHEM 8, ED
BUN: 30 mg/dL — ABNORMAL HIGH (ref 6–20)
CHLORIDE: 107 mmol/L (ref 101–111)
Calcium, Ion: 1.1 mmol/L — ABNORMAL LOW (ref 1.15–1.40)
Creatinine, Ser: 1.6 mg/dL — ABNORMAL HIGH (ref 0.44–1.00)
GLUCOSE: 169 mg/dL — AB (ref 65–99)
HCT: 34 % — ABNORMAL LOW (ref 36.0–46.0)
HEMOGLOBIN: 11.6 g/dL — AB (ref 12.0–15.0)
POTASSIUM: 4.9 mmol/L (ref 3.5–5.1)
SODIUM: 142 mmol/L (ref 135–145)
TCO2: 24 mmol/L (ref 0–100)

## 2016-09-12 LAB — CBC
HEMATOCRIT: 34.3 % — AB (ref 36.0–46.0)
Hemoglobin: 10.8 g/dL — ABNORMAL LOW (ref 12.0–15.0)
MCH: 29.8 pg (ref 26.0–34.0)
MCHC: 31.5 g/dL (ref 30.0–36.0)
MCV: 94.8 fL (ref 78.0–100.0)
PLATELETS: 233 10*3/uL (ref 150–400)
RBC: 3.62 MIL/uL — ABNORMAL LOW (ref 3.87–5.11)
RDW: 15.5 % (ref 11.5–15.5)
WBC: 9.2 10*3/uL (ref 4.0–10.5)

## 2016-09-12 LAB — COMPREHENSIVE METABOLIC PANEL
ALT: 13 U/L — AB (ref 14–54)
ANION GAP: 11 (ref 5–15)
AST: 18 U/L (ref 15–41)
Albumin: 4 g/dL (ref 3.5–5.0)
Alkaline Phosphatase: 85 U/L (ref 38–126)
BUN: 24 mg/dL — ABNORMAL HIGH (ref 6–20)
CHLORIDE: 107 mmol/L (ref 101–111)
CO2: 22 mmol/L (ref 22–32)
CREATININE: 1.67 mg/dL — AB (ref 0.44–1.00)
Calcium: 9 mg/dL (ref 8.9–10.3)
GFR, EST AFRICAN AMERICAN: 30 mL/min — AB (ref >60)
GFR, EST NON AFRICAN AMERICAN: 26 mL/min — AB (ref >60)
Glucose, Bld: 177 mg/dL — ABNORMAL HIGH (ref 65–99)
POTASSIUM: 4.7 mmol/L (ref 3.5–5.1)
SODIUM: 140 mmol/L (ref 135–145)
Total Bilirubin: 0.5 mg/dL (ref 0.3–1.2)
Total Protein: 6.6 g/dL (ref 6.5–8.1)

## 2016-09-12 LAB — DIFFERENTIAL
BASOS PCT: 0 %
Basophils Absolute: 0 10*3/uL (ref 0.0–0.1)
EOS ABS: 0.2 10*3/uL (ref 0.0–0.7)
EOS PCT: 2 %
Lymphocytes Relative: 19 %
Lymphs Abs: 1.8 10*3/uL (ref 0.7–4.0)
MONO ABS: 0.5 10*3/uL (ref 0.1–1.0)
MONOS PCT: 5 %
NEUTROS ABS: 6.8 10*3/uL (ref 1.7–7.7)
Neutrophils Relative %: 74 %

## 2016-09-12 LAB — APTT: aPTT: 33 seconds (ref 24–36)

## 2016-09-12 LAB — I-STAT TROPONIN, ED: TROPONIN I, POC: 0.02 ng/mL (ref 0.00–0.08)

## 2016-09-12 LAB — PROTIME-INR
INR: 0.93
Prothrombin Time: 12.4 seconds (ref 11.4–15.2)

## 2016-09-12 LAB — MAGNESIUM: MAGNESIUM: 1.6 mg/dL — AB (ref 1.7–2.4)

## 2016-09-12 MED ORDER — FUROSEMIDE 10 MG/ML IJ SOLN
80.0000 mg | Freq: Once | INTRAMUSCULAR | Status: AC
  Administered 2016-09-13: 80 mg via INTRAVENOUS
  Filled 2016-09-12: qty 8

## 2016-09-12 MED ORDER — MAGNESIUM SULFATE 2 GM/50ML IV SOLN
2.0000 g | Freq: Once | INTRAVENOUS | Status: AC
  Administered 2016-09-13: 2 g via INTRAVENOUS
  Filled 2016-09-12: qty 50

## 2016-09-12 MED ORDER — AZITHROMYCIN 250 MG PO TABS
500.0000 mg | ORAL_TABLET | Freq: Once | ORAL | Status: AC
  Administered 2016-09-13: 500 mg via ORAL
  Filled 2016-09-12: qty 2

## 2016-09-12 MED ORDER — DEXTROSE 5 % IV SOLN
2.0000 g | Freq: Once | INTRAVENOUS | Status: AC
  Administered 2016-09-13: 2 g via INTRAVENOUS
  Filled 2016-09-12: qty 2

## 2016-09-12 NOTE — ED Triage Notes (Signed)
Pt arrived via ems with pt from home. Pt daughter reported pt had left-sided numbness/weakness with noted drooping. Pt stated she felt as though her left eye was dragging and noncompliant.  Pt is alert and oriented and answering questions/following commands appropriately.

## 2016-09-12 NOTE — ED Provider Notes (Signed)
[facility] DEPT Provider Note   CSN: [id number] Arrival date & time: 09/12/16  2300 By signing my name below, I, Georgette Shell, attest that this documentation has been prepared under the direction and in the presence of Everlene Balls, MD. Electronically Signed: Georgette Shell, ED Scribe. 09/12/16. 11:28 PM.  History   Chief Complaint Chief Complaint  Patient presents with  . Code Stroke   HPI Comments: Ann Sellers is a 82 y.o. female with h/o asthma, CKD, DM, TIA, and HTN who presents to the Emergency Department by EMS complaining of generalized weakness and pain in her BLEs (L>R) onset ~5:30 pm today. Pt also has numbness in the left side of her neck radiating to her head. Daughter reports that she noticed that pt's left eye was twitching. Pt was not given any OTC medications PTA. Pt is normally noncompliant with her Lasix prescription. Pt denies speech difficulty, fever, chills, or any other associated symptoms.  The history is provided by the patient and a relative. No language interpreter was used.    Past Medical History:  Diagnosis Date  . Anemia   . Arthritis   . Asthma   . B12 deficiency   . Chronic kidney disease   . Diabetes mellitus ([place])   . Hypertension   . Neuromuscular disorder ([place])   . Right rotator cuff tear   . Wears dentures    top denture-bottom partial  . Wears glasses   . Wears hearing aid    both ears    Patient Active Problem List   Diagnosis Date Noted  . Severe persistent chronic asthma without complication 56/31/4970  . Morbid obesity ([place]) 07/06/2015  . Edema 07/22/2014  . Varicose veins of lower extremities with complications 26/37/8588  . TIA (transient ischemic attack) 02/01/2014  . CKD (chronic kidney disease), stage III 02/01/2014  . Right rotator cuff tear   . Hypertension   . Arthritis   . Diabetes mellitus ([place])   . Neuromuscular disorder ([place])   . Wears glasses   . Wears dentures   . Wears hearing aid   . Asthma 07/11/2011  .  Diabetes mellitus 07/11/2011  . Vitamin D deficiency 07/11/2011  . Osteoarthritis 07/11/2011  . Cough 07/11/2011    Past Surgical History:  Procedure Laterality Date  . APPENDECTOMY  1945  . CATARACT EXTRACTION, BILATERAL  2008  . cervical stynosis  2005  . esophageal stretch  2001  . [place]  . herniated disc  1983  . left hand surgery  2011   gout  . left knee replacement  2002  . left knee surgery  1998  . right wrist fracture  2006  . SHOULDER ARTHROSCOPY WITH ROTATOR CUFF REPAIR Right 01/05/2013   Procedure: SHOULDER ARTHROSCOPY WITH ROTATOR CUFF REPAIR subchromial decompression and distal clavical excision;  Surgeon: Lorn Junes, MD;  Location: [facility];  Service: Orthopedics;  Laterality: Right;  . tonsillectomy  1936  . TONSILLECTOMY    . TYMPANOSTOMY TUBE PLACEMENT  1989  . VESICOVAGINAL FISTULA CLOSURE W/ TAH  1968    OB History    No data available       Home Medications    Prior to Admission medications   Medication Sig Start Date End Date Taking? Authorizing Provider  albuterol (PROVENTIL HFA;VENTOLIN HFA) 108 (90 BASE) MCG/ACT inhaler Inhale 2 puffs into the lungs every 6 (six) hours as needed.      Historical Provider, MD  albuterol (PROVENTIL) (2.5 MG/3ML)  0.083% nebulizer solution Take 2.5 mg by nebulization every 6 (six) hours as needed for wheezing or shortness of breath.    Historical Provider, MD  allopurinol (ZYLOPRIM) 100 MG tablet Take 100 mg by mouth daily.      Historical Provider, MD  calcitRIOL (ROCALTROL) 0.25 MCG capsule Take 1 capsule by mouth daily. 06/28/15   Historical Provider, MD  Cyanocobalamin (VITAMIN B 12 PO) Take by mouth. Injection once monthly for Anemia    Historical Provider, MD  diphenoxylate-atropine (LOMOTIL) 2.5-0.025 MG per tablet Take 1 tablet by mouth daily as needed for diarrhea or loose stools.    Historical Provider, MD  fexofenadine (ALLEGRA) 180 MG tablet Take 1 tablet (180 mg  total) by mouth daily. 04/25/16   Tanda Rockers, MD  furosemide (LASIX) 20 MG tablet Take 40 mg by mouth daily.     Historical Provider, MD  gabapentin (NEURONTIN) 300 MG capsule Take 300 mg by mouth 3 (three) times daily.    Historical Provider, MD  HYDROcodone-acetaminophen (NORCO/VICODIN) 5-325 MG per tablet 1/2 to 1 every 6 hours as needed 06/17/15   Historical Provider, MD  insulin glargine (LANTUS) 100 UNIT/ML injection Inject 20 Units into the skin at bedtime.     Historical Provider, MD  metolazone (ZAROXOLYN) 2.5 MG tablet Take 2.5 mg by mouth daily as needed.    Historical Provider, MD  mometasone (NASONEX) 50 MCG/ACT nasal spray Place 2 sprays into the nose daily.     Historical Provider, MD  mometasone-formoterol (DULERA) 100-5 MCG/ACT AERO Take 2 puffs first thing in am and then another 2 puffs about 12 hours later. 08/22/15   Tanda Rockers, MD  Multiple Vitamins-Minerals (ICAPS MV) TABS Take 1 tablet by mouth daily. Reported on 11/20/2015    Historical Provider, MD  omeprazole (PRILOSEC) 20 MG capsule Take 30-60 min before last  meal of the day 11/27/15   Tanda Rockers, MD  omeprazole (PRILOSEC) 40 MG capsule Take 30-60 min before first meal of the day 11/23/15   Tanda Rockers, MD  Respiratory Therapy Supplies (FLUTTER) DEVI Use as directed 07/24/15   Tanda Rockers, MD  sitaGLIPtin (JANUVIA) 100 MG tablet Take 100 mg by mouth daily. Reported on 11/20/2015    Historical Provider, MD  tiZANidine (ZANAFLEX) 4 MG tablet Take 4 mg by mouth at bedtime.    Historical Provider, MD    Family History Family History  Problem Relation Age of Onset  . Pneumonia Mother   . Diabetes Son   . Hypertension Son   . Heart disease Sister   . Hypertension Sister   . Diabetes Sister   . Hypertension Sister   . Diabetes Daughter     Social History Social History  Substance Use Topics  . Smoking status: Former Smoker    Packs/day: 0.50    Years: 20.00    Types: Cigarettes    Quit date: 10/22/1987    . Smokeless tobacco: Never Used  . Alcohol use 4.2 oz/week    7 Glasses of wine per week     Comment: social drinker     Allergies   Snake antivenin [antivenin crotalidae polyvalent (snake antivenin)]   Review of Systems Review of Systems  Constitutional: Negative for chills and fever.  Musculoskeletal: Positive for arthralgias and myalgias.  Neurological: Positive for facial asymmetry, weakness and numbness. Negative for speech difficulty.  All other systems reviewed and are negative.    Physical Exam Updated Vital Signs BP 143/97   Pulse Marland Kitchen)  129   Resp (!) 29   Ht 4\' 11"  (1.499 m)   Wt 209 lb 8 oz (95 kg)   SpO2 93%   BMI 42.31 kg/m   Physical Exam  Constitutional: She is oriented to person, place, and time. She appears well-developed and well-nourished. No distress.  Tremor at rest.  HENT:  Head: Normocephalic and atraumatic.  Nose: Nose normal.  Mouth/Throat: Oropharynx is clear and moist. No oropharyngeal exudate.  Eyes: Conjunctivae and EOM are normal. Pupils are equal, round, and reactive to light. No scleral icterus.  Neck: Normal range of motion. Neck supple. No JVD present. No tracheal deviation present. No thyromegaly present.  Cardiovascular: Regular rhythm and normal heart sounds.  Tachycardia present.  Exam reveals no gallop and no friction rub.   No murmur heard. Pulmonary/Chest: Tachypnea noted. She is in respiratory distress. She has wheezes. She exhibits no tenderness.  Wheezing and crackles in bilateral lungs.  Abdominal: Soft. Bowel sounds are normal. She exhibits no distension and no mass. There is no tenderness. There is no rebound and no guarding.  Musculoskeletal: Normal range of motion. She exhibits edema. She exhibits no tenderness.  Bilateral lower extremity edema.  Lymphadenopathy:    She has no cervical adenopathy.  Neurological: She is alert and oriented to person, place, and time. No cranial nerve deficit. She exhibits normal muscle  tone.  Normal sensation in all extremities. Mild weakness of LLE 4/5.  Normal cerebellar testing.  Skin: Skin is warm and dry. No rash noted. No erythema. No pallor.  Nursing note and vitals reviewed.    ED Treatments / Results  DIAGNOSTIC STUDIES: Oxygen Saturation is 93% on RA, poor by my interpretation.    COORDINATION OF CARE: 11:27 PM Discussed treatment plan with pt at bedside and pt agreed to plan.  Labs (all labs ordered are listed, but only abnormal results are displayed) Labs Reviewed  CBC - Abnormal; Notable for the following:       Result Value   RBC 3.62 (*)    Hemoglobin 10.8 (*)    HCT 34.3 (*)    All other components within normal limits  I-STAT CHEM 8, ED - Abnormal; Notable for the following:    BUN 30 (*)    Creatinine, Ser 1.60 (*)    Glucose, Bld 169 (*)    Calcium, Ion 1.10 (*)    Hemoglobin 11.6 (*)    HCT 34.0 (*)    All other components within normal limits  PROTIME-INR  APTT  DIFFERENTIAL  COMPREHENSIVE METABOLIC PANEL  BRAIN NATRIURETIC PEPTIDE  MAGNESIUM  I-STAT TROPOININ, ED  CBG MONITORING, ED    EKG  EKG Interpretation  Date/Time:  Thursday September 12 2016 23:22:33 EST Ventricular Rate:  132 PR Interval:    QRS Duration: 110 QT Interval:  305 QTC Calculation: 447 R Axis:   -67 Text Interpretation:  Sinus tachycardia Incomplete RBBB and LAFB Borderline low voltage, extremity leads Consider right ventricular hypertrophy tachycardia now present Confirmed by Glynn Octave [phone]) on 09/12/2016 11:27:28 PM       Radiology Ct Head Code Stroke W/o Cm  Result Date: 09/12/2016 CLINICAL DATA:  Code stroke.  Left-sided weakness and left eye droop EXAM: CT HEAD WITHOUT CONTRAST TECHNIQUE: Contiguous axial images were obtained from the base of the skull through the vertex without intravenous contrast. COMPARISON:  02/01/2014 MR head and CT head. FINDINGS: Brain: No evidence of large acute infarct, focal mass effect, or  intracranial hemorrhage. Stable ventricle size. No  extra-axial collection. Stable foci of hypoattenuation in subcortical and periventricular white matter compatible with mild chronic microvascular ischemic changes mild brain parenchymal volume loss. Planum sphenoidale mass measures 24 x 23 mm (AP by ML) and is increased in size from 2015 most compatible with meningioma. There is mass effect on the pre chiasmatic optic nerves and chiasm as well as the anterior third ventricle. Vascular: No hyperdense vessel. Calcific atherosclerosis of carotid siphons. Skull: Normal. Negative for fracture or focal lesion. Sinuses/Orbits: No acute finding. Bilateral intra-ocular lens replacement. Other: None. ASPECTS [facility] Stroke Program Early CT Score) - Ganglionic level infarction (caudate, lentiform nuclei, internal capsule, insula, M1-M3 cortex): 7 - Supraganglionic infarction (M4-M6 cortex): 3 Total score (0-10 with 10 being normal): 10 IMPRESSION: 1. No evidence for large acute infarct, focal mass effect, or intracranial hemorrhage. 2. Planum sphenoidale mass is increased in size from 2015 most consistent with meningioma. 3. ASPECTS is 10 These results were called by telephone at the time of interpretation on 09/12/2016 at 11:18 pm to Dr. Leonel Ramsay, who verbally acknowledged these results. Electronically Signed   By: Kristine Garbe M.D.   On: 09/12/2016 23:20    Procedures Procedures (including critical care time)  Medications Ordered in ED Medications  furosemide (LASIX) injection 80 mg (not administered)     Initial Impression / Assessment and Plan / ED Course  I have reviewed the triage vital signs and the nursing notes.  Pertinent labs & imaging results that were available during my care of the patient were reviewed by me and considered in my medical decision making (see chart for details).  Clinical Course     Patient presents to the emergency department as a stroke alert. CT scan did not  reveal any bleed. Dr. Leonel Ramsay with neurology also evaluated the patient and recommends MRI of the head and neck. Patient's neurological exam is not impressive, she appears to be more consistent with a CHF exacerbation. Chest x-ray also reveals possible pneumonia. Patient was given 80 mg IV Lasix, blood cultures were obtained and she was given ceftriaxone azithromycin for treatment. She will certainly require admission for further care. I will page the hospitalist for admission.  Final Clinical Impressions(s) / ED Diagnoses   Final diagnoses:  None    New Prescriptions New Prescriptions   No medications on file      I personally performed the services described in this documentation, which was scribed in my presence. The recorded information has been reviewed and is accurate.       Everlene Balls, MD 09/13/16 [phone]

## 2016-09-13 ENCOUNTER — Encounter (HOSPITAL_COMMUNITY): Payer: Self-pay | Admitting: Internal Medicine

## 2016-09-13 ENCOUNTER — Emergency Department (HOSPITAL_COMMUNITY): Payer: Medicare Other

## 2016-09-13 DIAGNOSIS — D329 Benign neoplasm of meninges, unspecified: Secondary | ICD-10-CM | POA: Diagnosis present

## 2016-09-13 DIAGNOSIS — Z87891 Personal history of nicotine dependence: Secondary | ICD-10-CM | POA: Diagnosis not present

## 2016-09-13 DIAGNOSIS — Z6841 Body Mass Index (BMI) 40.0 and over, adult: Secondary | ICD-10-CM | POA: Diagnosis not present

## 2016-09-13 DIAGNOSIS — E1122 Type 2 diabetes mellitus with diabetic chronic kidney disease: Secondary | ICD-10-CM | POA: Diagnosis present

## 2016-09-13 DIAGNOSIS — J45909 Unspecified asthma, uncomplicated: Secondary | ICD-10-CM | POA: Diagnosis present

## 2016-09-13 DIAGNOSIS — D649 Anemia, unspecified: Secondary | ICD-10-CM | POA: Diagnosis present

## 2016-09-13 DIAGNOSIS — I4891 Unspecified atrial fibrillation: Secondary | ICD-10-CM | POA: Diagnosis not present

## 2016-09-13 DIAGNOSIS — R269 Unspecified abnormalities of gait and mobility: Secondary | ICD-10-CM | POA: Diagnosis not present

## 2016-09-13 DIAGNOSIS — I5033 Acute on chronic diastolic (congestive) heart failure: Secondary | ICD-10-CM

## 2016-09-13 DIAGNOSIS — M79609 Pain in unspecified limb: Secondary | ICD-10-CM | POA: Diagnosis not present

## 2016-09-13 DIAGNOSIS — I5032 Chronic diastolic (congestive) heart failure: Secondary | ICD-10-CM | POA: Diagnosis present

## 2016-09-13 DIAGNOSIS — J189 Pneumonia, unspecified organism: Secondary | ICD-10-CM | POA: Diagnosis present

## 2016-09-13 DIAGNOSIS — R29898 Other symptoms and signs involving the musculoskeletal system: Secondary | ICD-10-CM | POA: Diagnosis not present

## 2016-09-13 DIAGNOSIS — I248 Other forms of acute ischemic heart disease: Secondary | ICD-10-CM | POA: Diagnosis present

## 2016-09-13 DIAGNOSIS — N183 Chronic kidney disease, stage 3 (moderate): Secondary | ICD-10-CM | POA: Diagnosis not present

## 2016-09-13 DIAGNOSIS — I130 Hypertensive heart and chronic kidney disease with heart failure and stage 1 through stage 4 chronic kidney disease, or unspecified chronic kidney disease: Secondary | ICD-10-CM | POA: Diagnosis present

## 2016-09-13 DIAGNOSIS — N179 Acute kidney failure, unspecified: Secondary | ICD-10-CM | POA: Diagnosis present

## 2016-09-13 DIAGNOSIS — M4802 Spinal stenosis, cervical region: Secondary | ICD-10-CM | POA: Diagnosis not present

## 2016-09-13 DIAGNOSIS — M179 Osteoarthritis of knee, unspecified: Secondary | ICD-10-CM | POA: Diagnosis not present

## 2016-09-13 DIAGNOSIS — E1165 Type 2 diabetes mellitus with hyperglycemia: Secondary | ICD-10-CM | POA: Diagnosis present

## 2016-09-13 DIAGNOSIS — Z833 Family history of diabetes mellitus: Secondary | ICD-10-CM | POA: Diagnosis not present

## 2016-09-13 DIAGNOSIS — M109 Gout, unspecified: Secondary | ICD-10-CM | POA: Diagnosis present

## 2016-09-13 DIAGNOSIS — J9601 Acute respiratory failure with hypoxia: Secondary | ICD-10-CM | POA: Diagnosis present

## 2016-09-13 DIAGNOSIS — Z96652 Presence of left artificial knee joint: Secondary | ICD-10-CM | POA: Diagnosis not present

## 2016-09-13 DIAGNOSIS — I509 Heart failure, unspecified: Secondary | ICD-10-CM

## 2016-09-13 DIAGNOSIS — R531 Weakness: Secondary | ICD-10-CM | POA: Diagnosis present

## 2016-09-13 DIAGNOSIS — Z974 Presence of external hearing-aid: Secondary | ICD-10-CM | POA: Diagnosis not present

## 2016-09-13 DIAGNOSIS — Z8249 Family history of ischemic heart disease and other diseases of the circulatory system: Secondary | ICD-10-CM | POA: Diagnosis not present

## 2016-09-13 DIAGNOSIS — I350 Nonrheumatic aortic (valve) stenosis: Secondary | ICD-10-CM | POA: Diagnosis not present

## 2016-09-13 DIAGNOSIS — Z79899 Other long term (current) drug therapy: Secondary | ICD-10-CM | POA: Diagnosis not present

## 2016-09-13 DIAGNOSIS — Z794 Long term (current) use of insulin: Secondary | ICD-10-CM | POA: Diagnosis not present

## 2016-09-13 DIAGNOSIS — J4550 Severe persistent asthma, uncomplicated: Secondary | ICD-10-CM | POA: Diagnosis not present

## 2016-09-13 DIAGNOSIS — R05 Cough: Secondary | ICD-10-CM | POA: Diagnosis not present

## 2016-09-13 DIAGNOSIS — I480 Paroxysmal atrial fibrillation: Secondary | ICD-10-CM | POA: Diagnosis present

## 2016-09-13 DIAGNOSIS — M7989 Other specified soft tissue disorders: Secondary | ICD-10-CM | POA: Diagnosis not present

## 2016-09-13 LAB — COMPREHENSIVE METABOLIC PANEL
ALBUMIN: 3.5 g/dL (ref 3.5–5.0)
ALK PHOS: 68 U/L (ref 38–126)
ALT: 13 U/L — ABNORMAL LOW (ref 14–54)
ANION GAP: 12 (ref 5–15)
AST: 16 U/L (ref 15–41)
BUN: 24 mg/dL — ABNORMAL HIGH (ref 6–20)
CALCIUM: 8.8 mg/dL — AB (ref 8.9–10.3)
CO2: 22 mmol/L (ref 22–32)
Chloride: 108 mmol/L (ref 101–111)
Creatinine, Ser: 1.69 mg/dL — ABNORMAL HIGH (ref 0.44–1.00)
GFR calc non Af Amer: 26 mL/min — ABNORMAL LOW (ref >60)
GFR, EST AFRICAN AMERICAN: 30 mL/min — AB (ref >60)
GLUCOSE: 169 mg/dL — AB (ref 65–99)
POTASSIUM: 4.6 mmol/L (ref 3.5–5.1)
SODIUM: 142 mmol/L (ref 135–145)
Total Bilirubin: 0.3 mg/dL (ref 0.3–1.2)
Total Protein: 6 g/dL — ABNORMAL LOW (ref 6.5–8.1)

## 2016-09-13 LAB — PROCALCITONIN

## 2016-09-13 LAB — CBC WITH DIFFERENTIAL/PLATELET
BASOS ABS: 0 10*3/uL (ref 0.0–0.1)
BASOS PCT: 0 %
Eosinophils Absolute: 0 10*3/uL (ref 0.0–0.7)
Eosinophils Relative: 1 %
HEMATOCRIT: 30.9 % — AB (ref 36.0–46.0)
HEMOGLOBIN: 9.7 g/dL — AB (ref 12.0–15.0)
LYMPHS PCT: 8 %
Lymphs Abs: 0.7 10*3/uL (ref 0.7–4.0)
MCH: 29.7 pg (ref 26.0–34.0)
MCHC: 31.4 g/dL (ref 30.0–36.0)
MCV: 94.5 fL (ref 78.0–100.0)
MONOS PCT: 9 %
Monocytes Absolute: 0.7 10*3/uL (ref 0.1–1.0)
NEUTROS ABS: 6.8 10*3/uL (ref 1.7–7.7)
NEUTROS PCT: 82 %
Platelets: 236 10*3/uL (ref 150–400)
RBC: 3.27 MIL/uL — ABNORMAL LOW (ref 3.87–5.11)
RDW: 15.6 % — ABNORMAL HIGH (ref 11.5–15.5)
WBC: 8.3 10*3/uL (ref 4.0–10.5)

## 2016-09-13 LAB — BRAIN NATRIURETIC PEPTIDE: B Natriuretic Peptide: 100.5 pg/mL — ABNORMAL HIGH (ref 0.0–100.0)

## 2016-09-13 LAB — GLUCOSE, CAPILLARY
GLUCOSE-CAPILLARY: 140 mg/dL — AB (ref 65–99)
Glucose-Capillary: 138 mg/dL — ABNORMAL HIGH (ref 65–99)
Glucose-Capillary: 221 mg/dL — ABNORMAL HIGH (ref 65–99)
Glucose-Capillary: 298 mg/dL — ABNORMAL HIGH (ref 65–99)

## 2016-09-13 LAB — TROPONIN I
TROPONIN I: 0.17 ng/mL — AB (ref <0.03)
TROPONIN I: 0.24 ng/mL — AB (ref <0.03)
Troponin I: 0.14 ng/mL (ref <0.03)

## 2016-09-13 LAB — TSH: TSH: 2.062 u[IU]/mL (ref 0.350–4.500)

## 2016-09-13 MED ORDER — ACETAMINOPHEN 650 MG RE SUPP: 650.0000 mg | Freq: Four times a day (QID) | RECTAL | Status: DC | PRN

## 2016-09-13 MED ORDER — ALBUTEROL SULFATE HFA 108 (90 BASE) MCG/ACT IN AERS: 2.0000 | INHALATION_SPRAY | Freq: Four times a day (QID) | RESPIRATORY_TRACT | Status: DC | PRN

## 2016-09-13 MED ORDER — ONDANSETRON HCL 4 MG PO TABS: 4.0000 mg | ORAL_TABLET | Freq: Four times a day (QID) | ORAL | Status: DC | PRN

## 2016-09-13 MED ORDER — ALBUTEROL SULFATE (2.5 MG/3ML) 0.083% IN NEBU: 2.5000 mg | INHALATION_SOLUTION | Freq: Four times a day (QID) | RESPIRATORY_TRACT | Status: DC | PRN

## 2016-09-13 MED ORDER — IPRATROPIUM-ALBUTEROL 0.5-2.5 (3) MG/3ML IN SOLN
3.0000 mL | RESPIRATORY_TRACT | Status: DC
  Administered 2016-09-13 (×2): 3 mL via RESPIRATORY_TRACT
  Filled 2016-09-13 (×4): qty 3

## 2016-09-13 MED ORDER — FUROSEMIDE 10 MG/ML IJ SOLN
40.0000 mg | Freq: Two times a day (BID) | INTRAMUSCULAR | Status: DC
  Administered 2016-09-13 – 2016-09-15 (×5): 40 mg via INTRAVENOUS
  Filled 2016-09-13 (×5): qty 4

## 2016-09-13 MED ORDER — GABAPENTIN 300 MG PO CAPS
300.0000 mg | ORAL_CAPSULE | Freq: Three times a day (TID) | ORAL | Status: DC
  Administered 2016-09-13 – 2016-09-16 (×10): 300 mg via ORAL
  Filled 2016-09-13 (×10): qty 1

## 2016-09-13 MED ORDER — INSULIN GLARGINE 100 UNIT/ML ~~LOC~~ SOLN
20.0000 [IU] | Freq: Every day | SUBCUTANEOUS | Status: DC
  Administered 2016-09-13: 20 [IU] via SUBCUTANEOUS
  Filled 2016-09-13 (×2): qty 0.2

## 2016-09-13 MED ORDER — PANTOPRAZOLE SODIUM 40 MG PO TBEC
40.0000 mg | DELAYED_RELEASE_TABLET | Freq: Every day | ORAL | Status: DC
  Administered 2016-09-13 – 2016-09-17 (×5): 40 mg via ORAL
  Filled 2016-09-13 (×5): qty 1

## 2016-09-13 MED ORDER — MONTELUKAST SODIUM 10 MG PO TABS
10.0000 mg | ORAL_TABLET | Freq: Every day | ORAL | Status: DC
  Administered 2016-09-13 – 2016-09-16 (×4): 10 mg via ORAL
  Filled 2016-09-13 (×4): qty 1

## 2016-09-13 MED ORDER — SODIUM CHLORIDE 0.9% FLUSH
3.0000 mL | Freq: Two times a day (BID) | INTRAVENOUS | Status: DC
  Administered 2016-09-13 – 2016-09-17 (×9): 3 mL via INTRAVENOUS

## 2016-09-13 MED ORDER — BUDESONIDE 0.25 MG/2ML IN SUSP
0.2500 mg | Freq: Two times a day (BID) | RESPIRATORY_TRACT | Status: DC
  Administered 2016-09-13 – 2016-09-17 (×7): 0.25 mg via RESPIRATORY_TRACT
  Filled 2016-09-13 (×10): qty 2

## 2016-09-13 MED ORDER — MOMETASONE FURO-FORMOTEROL FUM 100-5 MCG/ACT IN AERO
2.0000 | INHALATION_SPRAY | Freq: Two times a day (BID) | RESPIRATORY_TRACT | Status: DC
  Administered 2016-09-13 – 2016-09-17 (×8): 2 via RESPIRATORY_TRACT
  Filled 2016-09-13: qty 8.8

## 2016-09-13 MED ORDER — ALLOPURINOL 100 MG PO TABS
100.0000 mg | ORAL_TABLET | Freq: Every day | ORAL | Status: DC
  Administered 2016-09-13 – 2016-09-17 (×5): 100 mg via ORAL
  Filled 2016-09-13 (×5): qty 1

## 2016-09-13 MED ORDER — PANTOPRAZOLE SODIUM 40 MG PO TBEC: 40.0000 mg | DELAYED_RELEASE_TABLET | Freq: Every day | ORAL | Status: DC

## 2016-09-13 MED ORDER — HYDROCODONE-ACETAMINOPHEN 5-325 MG PO TABS
0.5000 | ORAL_TABLET | Freq: Four times a day (QID) | ORAL | Status: DC | PRN
  Administered 2016-09-13: 1 via ORAL
  Filled 2016-09-13: qty 1

## 2016-09-13 MED ORDER — ACETAMINOPHEN 325 MG PO TABS
650.0000 mg | ORAL_TABLET | Freq: Four times a day (QID) | ORAL | Status: DC | PRN
Start: 2016-09-13 — End: 2016-09-17

## 2016-09-13 MED ORDER — PROSIGHT PO TABS
1.0000 | ORAL_TABLET | Freq: Every day | ORAL | Status: DC
  Administered 2016-09-13 – 2016-09-17 (×5): 1 via ORAL
  Filled 2016-09-13 (×5): qty 1

## 2016-09-13 MED ORDER — DOXYCYCLINE HYCLATE 100 MG IV SOLR
100.0000 mg | Freq: Two times a day (BID) | INTRAVENOUS | Status: DC
  Administered 2016-09-13 – 2016-09-15 (×5): 100 mg via INTRAVENOUS
  Filled 2016-09-13 (×6): qty 100

## 2016-09-13 MED ORDER — ENOXAPARIN SODIUM 30 MG/0.3ML ~~LOC~~ SOLN
30.0000 mg | Freq: Every day | SUBCUTANEOUS | Status: DC
  Administered 2016-09-13 – 2016-09-17 (×5): 30 mg via SUBCUTANEOUS
  Filled 2016-09-13 (×5): qty 0.3

## 2016-09-13 MED ORDER — LORATADINE 10 MG PO TABS
10.0000 mg | ORAL_TABLET | Freq: Every day | ORAL | Status: DC
  Administered 2016-09-13 – 2016-09-17 (×5): 10 mg via ORAL
  Filled 2016-09-13 (×5): qty 1

## 2016-09-13 MED ORDER — LEVALBUTEROL HCL 0.63 MG/3ML IN NEBU: 0.6300 mg | INHALATION_SOLUTION | Freq: Four times a day (QID) | RESPIRATORY_TRACT | Status: DC | PRN

## 2016-09-13 MED ORDER — LINAGLIPTIN 5 MG PO TABS
5.0000 mg | ORAL_TABLET | Freq: Every day | ORAL | Status: DC
  Administered 2016-09-13 – 2016-09-17 (×5): 5 mg via ORAL
  Filled 2016-09-13 (×5): qty 1

## 2016-09-13 MED ORDER — METOLAZONE 2.5 MG PO TABS: 2.5000 mg | ORAL_TABLET | Freq: Every day | ORAL | Status: DC | PRN

## 2016-09-13 MED ORDER — INSULIN ASPART 100 UNIT/ML ~~LOC~~ SOLN
0.0000 [IU] | Freq: Three times a day (TID) | SUBCUTANEOUS | Status: DC
  Administered 2016-09-13: 1 [IU] via SUBCUTANEOUS
  Administered 2016-09-13: 2 [IU] via SUBCUTANEOUS
  Administered 2016-09-13: 1 [IU] via SUBCUTANEOUS
  Administered 2016-09-14 (×2): 3 [IU] via SUBCUTANEOUS
  Administered 2016-09-14: 5 [IU] via SUBCUTANEOUS
  Administered 2016-09-15 (×2): 2 [IU] via SUBCUTANEOUS
  Administered 2016-09-15: 3 [IU] via SUBCUTANEOUS
  Administered 2016-09-16 (×2): 2 [IU] via SUBCUTANEOUS
  Administered 2016-09-16: 5 [IU] via SUBCUTANEOUS
  Administered 2016-09-17: 1 [IU] via SUBCUTANEOUS

## 2016-09-13 MED ORDER — METOPROLOL TARTRATE 12.5 MG HALF TABLET
12.5000 mg | ORAL_TABLET | Freq: Two times a day (BID) | ORAL | Status: DC
  Administered 2016-09-13 – 2016-09-17 (×9): 12.5 mg via ORAL
  Filled 2016-09-13 (×9): qty 1

## 2016-09-13 MED ORDER — METHYLPREDNISOLONE SODIUM SUCC 125 MG IJ SOLR
60.0000 mg | Freq: Two times a day (BID) | INTRAMUSCULAR | Status: DC
  Administered 2016-09-13 – 2016-09-16 (×7): 60 mg via INTRAVENOUS
  Filled 2016-09-13 (×7): qty 2

## 2016-09-13 MED ORDER — CALCITRIOL 0.25 MCG PO CAPS
0.2500 ug | ORAL_CAPSULE | Freq: Every day | ORAL | Status: DC
  Administered 2016-09-13 – 2016-09-17 (×5): 0.25 ug via ORAL
  Filled 2016-09-13 (×5): qty 1

## 2016-09-13 MED ORDER — ONDANSETRON HCL 4 MG/2ML IJ SOLN: 4.0000 mg | Freq: Four times a day (QID) | INTRAMUSCULAR | Status: DC | PRN

## 2016-09-13 MED ORDER — TIZANIDINE HCL 4 MG PO TABS
4.0000 mg | ORAL_TABLET | Freq: Three times a day (TID) | ORAL | Status: DC | PRN
  Administered 2016-09-13 – 2016-09-15 (×2): 4 mg via ORAL
  Filled 2016-09-13 (×2): qty 1

## 2016-09-13 MED ORDER — FLUTICASONE PROPIONATE 50 MCG/ACT NA SUSP
2.0000 | Freq: Every day | NASAL | Status: DC
  Administered 2016-09-13 – 2016-09-17 (×5): 2 via NASAL
  Filled 2016-09-13: qty 16

## 2016-09-13 NOTE — Progress Notes (Signed)
Called ED for report.Spoke to [place], who will call me back.

## 2016-09-13 NOTE — H&P (Signed)
History and Physical    Ann Sellers DGU:[id number] DOB: December 22, 1924 DOA: 09/12/2016  PCP: Eloise Levels, NP  Patient coming from: Home.  Chief Complaint: Bilateral lower extremity weakness.  HPI: Ann Sellers is a 82 y.o. female with with history of diastolic CHF, diabetes mellitus, chronic kidney disease, asthma, gout started experiencing increasing weakness of the both lower extremity over the last 3 days with increasing neck pain. Patient's daughter who visited patient yesterday noticed that patient was finding it difficult to ambulate. Also noted that patient had some left eye drooping. Patient was brought as a code stroke. Neurology was consulted and on exam patient was found to have edema of the both lower extremities. Code stroke was canceled. MRI of the brain and C-spine was done. C-spine MRI shows severe canal stenosis. In addition chest x-ray was showing possible pneumonia. Patient denies any chest pain or productive cough fever or chills. Has been having some shortness of breath off and on.   Patient's left eye droop has resolved by the time patient wished ER.  ED Course: Lasix 80 mg IV was given. Patient was started on antibiotics for possible pneumonia. Neurology was consulted. MRI of the brain and C-spine was done. EKG shows A. fib with RVR which has reverted to sinus rhythm.  Review of Systems: As per HPI, rest all negative.   Past Medical History:  Diagnosis Date  . Anemia   . Arthritis   . Asthma   . B12 deficiency   . Chronic kidney disease   . Diabetes mellitus ([place])   . Hypertension   . Neuromuscular disorder ([place])   . Right rotator cuff tear   . Wears dentures    top denture-bottom partial  . Wears glasses   . Wears hearing aid    both ears    Past Surgical History:  Procedure Laterality Date  . APPENDECTOMY  1945  . CATARACT EXTRACTION, BILATERAL  2008  . cervical stynosis  2005  . esophageal stretch  2001  . [place]  .  herniated disc  1983  . left hand surgery  2011   gout  . left knee replacement  2002  . left knee surgery  1998  . right wrist fracture  2006  . SHOULDER ARTHROSCOPY WITH ROTATOR CUFF REPAIR Right 01/05/2013   Procedure: SHOULDER ARTHROSCOPY WITH ROTATOR CUFF REPAIR subchromial decompression and distal clavical excision;  Surgeon: Lorn Junes, MD;  Location: [facility];  Service: Orthopedics;  Laterality: Right;  . tonsillectomy  1936  . TONSILLECTOMY    . TYMPANOSTOMY TUBE PLACEMENT  1989  . VESICOVAGINAL FISTULA CLOSURE W/ TAH  1968     reports that she quit smoking about 28 years ago. Her smoking use included Cigarettes. She has a 10.00 pack-year smoking history. She has never used smokeless tobacco. She reports that she drinks about 4.2 oz of alcohol per week . She reports that she does not use drugs.  Allergies  Allergen Reactions  . Snake Antivenin [Antivenin Crotalidae Polyvalent (Snake Antivenin)] Other (See Comments)    unknown    Family History  Problem Relation Age of Onset  . Pneumonia Mother   . Diabetes Son   . Hypertension Son   . Heart disease Sister   . Hypertension Sister   . Diabetes Sister   . Hypertension Sister   . Diabetes Daughter     Prior to Admission medications   Medication Sig Start Date End Date Taking?  Authorizing Provider  albuterol (PROVENTIL HFA;VENTOLIN HFA) 108 (90 BASE) MCG/ACT inhaler Inhale 2 puffs into the lungs every 6 (six) hours as needed for wheezing or shortness of breath.    Yes Historical Provider, MD  albuterol (PROVENTIL) (2.5 MG/3ML) 0.083% nebulizer solution Take 2.5 mg by nebulization every 6 (six) hours as needed for wheezing or shortness of breath.   Yes Historical Provider, MD  allopurinol (ZYLOPRIM) 100 MG tablet Take 100 mg by mouth daily.     Yes Historical Provider, MD  calcitRIOL (ROCALTROL) 0.25 MCG capsule Take 1 capsule by mouth daily. 06/28/15  Yes Historical Provider, MD  diclofenac sodium  (VOLTAREN) 1 % GEL Apply 2 g topically daily as needed (pain).   Yes Historical Provider, MD  diphenoxylate-atropine (LOMOTIL) 2.5-0.025 MG per tablet Take 1 tablet by mouth daily as needed for diarrhea or loose stools.   Yes Historical Provider, MD  fexofenadine (ALLEGRA) 180 MG tablet Take 1 tablet (180 mg total) by mouth daily. 04/25/16  Yes Tanda Rockers, MD  furosemide (LASIX) 20 MG tablet Take 40 mg by mouth daily as needed for fluid.    Yes Historical Provider, MD  gabapentin (NEURONTIN) 300 MG capsule Take 300 mg by mouth 3 (three) times daily.   Yes Historical Provider, MD  HYDROcodone-acetaminophen (NORCO/VICODIN) 5-325 MG per tablet Take 0.5-1 tablets by mouth every 6 (six) hours as needed for moderate pain. 1/2 to 1 every 6 hours as needed 06/17/15  Yes Historical Provider, MD  insulin glargine (LANTUS) 100 UNIT/ML injection Inject 20 Units into the skin at bedtime.    Yes Historical Provider, MD  metolazone (ZAROXOLYN) 2.5 MG tablet Take 2.5 mg by mouth daily as needed (fluid).    Yes Historical Provider, MD  mometasone (NASONEX) 50 MCG/ACT nasal spray Place 2 sprays into the nose daily as needed (shortness of breath).    Yes Historical Provider, MD  mometasone-formoterol (DULERA) 100-5 MCG/ACT AERO Take 2 puffs first thing in am and then another 2 puffs about 12 hours later. Patient taking differently: Inhale 2 puffs into the lungs 2 (two) times daily as needed for wheezing or shortness of breath.  08/22/15  Yes Tanda Rockers, MD  montelukast (SINGULAIR) 10 MG tablet Take 10 mg by mouth at bedtime.   Yes Historical Provider, MD  Multiple Vitamins-Minerals (ICAPS MV) TABS Take 2 tablets by mouth daily. Reported on 11/20/2015   Yes Historical Provider, MD  omeprazole (PRILOSEC) 20 MG capsule Take 30-60 min before last  meal of the day 11/27/15  Yes Tanda Rockers, MD  omeprazole (PRILOSEC) 40 MG capsule Take 30-60 min before first meal of the day 11/23/15  Yes Tanda Rockers, MD  predniSONE  (DELTASONE) 20 MG tablet Take 20 mg by mouth daily as needed (wheezing, shortness of breath).   Yes Historical Provider, MD  Respiratory Therapy Supplies (FLUTTER) DEVI Use as directed 07/24/15  Yes Tanda Rockers, MD  sitaGLIPtin (JANUVIA) 100 MG tablet Take 100 mg by mouth daily. Reported on 11/20/2015   Yes Historical Provider, MD  tiZANidine (ZANAFLEX) 4 MG tablet Take 4 mg by mouth every 8 (eight) hours as needed for muscle spasms.    Yes Historical Provider, MD    Physical Exam: Vitals:   09/13/16 0205 09/13/16 0215 09/13/16 0258 09/13/16 0300  BP:  111/80 (!) 153/63   Pulse: 107 99 96   Resp: 21 24 18    Temp:   98.7 F (37.1 C)   TempSrc:   Oral  SpO2: 97% 92% 96%   Weight:    91.5 kg (201 lb 11.2 oz)  Height:    4' 10.5" (1.486 m)      Constitutional: Moderately built and nourished. Vitals:   09/13/16 0205 09/13/16 0215 09/13/16 0258 09/13/16 0300  BP:  111/80 (!) 153/63   Pulse: 107 99 96   Resp: 21 24 18    Temp:   98.7 F (37.1 C)   TempSrc:   Oral   SpO2: 97% 92% 96%   Weight:    91.5 kg (201 lb 11.2 oz)  Height:    4' 10.5" (1.486 m)   Eyes: Anicteric no pallor. ENMT: No discharge from the ears eyes nose or mouth. Neck: No mass felt. No neck rigidity. Respiratory: No rhonchi or crepitations. Cardiovascular: S1 and S2 heard. Abdomen: Soft nontender bowel sounds present. No guarding or rigidity. Musculoskeletal: Bilateral lower extremity edema. Skin: Erythema over the both lower extremities. Which patient states is chronic. Neurologic: Alert awake oriented to time place and person. Moves all extremities 5 x 5. No facial asymmetry. Tongue is midline. Psychiatric: Appears normal. Normal affect.   Labs on Admission: I have personally reviewed following labs and imaging studies  CBC:  Recent Labs Lab 09/12/16 2302 09/12/16 2310  WBC 9.2  --   NEUTROABS 6.8  --   HGB 10.8* 11.6*  HCT 34.3* 34.0*  MCV 94.8  --   PLT 233  --    Basic Metabolic  Panel:  Recent Labs Lab 09/12/16 2302 09/12/16 2304 09/12/16 2310  NA 140  --  142  K 4.7  --  4.9  CL 107  --  107  CO2 22  --   --   GLUCOSE 177*  --  169*  BUN 24*  --  30*  CREATININE 1.67*  --  1.60*  CALCIUM 9.0  --   --   MG  --  1.6*  --    GFR: Estimated Creatinine Clearance: 23.3 mL/min (by C-G formula based on SCr of 1.6 mg/dL (H)). Liver Function Tests:  Recent Labs Lab 09/12/16 2302  AST 18  ALT 13*  ALKPHOS 85  BILITOT 0.5  PROT 6.6  ALBUMIN 4.0   No results for input(s): LIPASE, AMYLASE in the last 168 hours. No results for input(s): AMMONIA in the last 168 hours. Coagulation Profile:  Recent Labs Lab 09/12/16 2302  INR 0.93   Cardiac Enzymes: No results for input(s): CKTOTAL, CKMB, CKMBINDEX, TROPONINI in the last 168 hours. BNP (last 3 results) No results for input(s): PROBNP in the last 8760 hours. HbA1C: No results for input(s): HGBA1C in the last 72 hours. CBG: No results for input(s): GLUCAP in the last 168 hours. Lipid Profile: No results for input(s): CHOL, HDL, LDLCALC, TRIG, CHOLHDL, LDLDIRECT in the last 72 hours. Thyroid Function Tests: No results for input(s): TSH, T4TOTAL, FREET4, T3FREE, THYROIDAB in the last 72 hours. Anemia Panel: No results for input(s): VITAMINB12, FOLATE, FERRITIN, TIBC, IRON, RETICCTPCT in the last 72 hours. Urine analysis:    Component Value Date/Time   COLORURINE YELLOW 02/01/2014 1732   APPEARANCEUR CLEAR 02/01/2014 1732   LABSPEC 1.014 02/01/2014 1732   PHURINE 5.0 02/01/2014 1732   GLUCOSEU NEGATIVE 02/01/2014 1732   HGBUR NEGATIVE 02/01/2014 1732   BILIRUBINUR NEGATIVE 02/01/2014 1732   KETONESUR NEGATIVE 02/01/2014 1732   PROTEINUR NEGATIVE 02/01/2014 1732   UROBILINOGEN 0.2 02/01/2014 1732   NITRITE NEGATIVE 02/01/2014 1732   LEUKOCYTESUR NEGATIVE 02/01/2014 1732   Sepsis Labs: @  LABRCNTIP(procalcitonin:4,lacticidven:4) )No results found for this or any previous visit (from the past 240  hour(s)).   Radiological Exams on Admission: Mr Brain Wo Contrast  Result Date: 09/13/2016 CLINICAL DATA:  82 y/o F; bilateral lower extremity weakness worse on the left. EXAM: MRI HEAD WITHOUT CONTRAST TECHNIQUE: Multiplanar, multiecho pulse sequences of the brain and surrounding structures were obtained without intravenous contrast. COMPARISON:  02/01/2014 MRI of the head. FINDINGS: Brain: No diffusion signal abnormality. Foci of T2 FLAIR hyperintense signal abnormality in subcortical and periventricular white matter compatible with moderate chronic microvascular ischemic changes and stable. Moderate brain parenchymal volume loss slightly progressed from 2015. No abnormal foci of susceptibility hypointensity to indicate brain parenchymal hemorrhage. Prominent retrocerebellar extra-axial space greater on the left is stable from prior study compatible with a small arachnoid cyst. Planum sphenoidale extra-axial mass measuring 25 x 24 by 17 mm (AP x ML x CC), series 4, image 12 and series 10, image 18. Mass partially effaces the suprasellar cistern with contact on the left A1 segment superiorly and the optic chiasm and pre chiasmatic optic nerves intra draped laterally around the mass posteriorly (series 9, image 38 and series 10, image 18). Vascular: Normal flow voids. Skull and upper cervical spine: Normal marrow signal. Sinuses/Orbits: Partial opacification of right mastoid air cells and mild paranasal sinus mucosal thickening. Bilateral intra-ocular lens replacement. No abnormal signal of left mastoid air cells. Other: None. IMPRESSION: 1. No acute intracranial abnormality identified. 2. Moderate chronic microvascular ischemic changes and brain parenchymal volume loss, slightly progressed from 2015. 3. Planum sphenoidale extra-axial mass likely representing meningioma is mildly increased in size from 2015 and exerts mass effect on left A1 and on the optic chiasm. 4. Mild paranasal sinus disease.  Electronically Signed   By: Kristine Garbe M.D.   On: 09/13/2016 02:04   Mr Cervical Spine Wo Contrast  Result Date: 09/13/2016 CLINICAL DATA:  82 y/o F; bilateral lower extremity weakness worse on the left. EXAM: MRI CERVICAL SPINE WITHOUT CONTRAST TECHNIQUE: Multiplanar, multisequence MR imaging of the cervical spine was performed. No intravenous contrast was administered. COMPARISON:  09/13/2016 MRI of the head. FINDINGS: Moderate motion degradation. Alignment: Straightening of cervical lordosis. Grade 1 C3-4 anterolisthesis. Vertebrae: C4 through C6 anterior fusion with susceptibility artifact from the fusion hardware partially obscuring the vertebral bodies at those levels. No evidence for fracture, diskitis, or suspicious bone lesion. Cord: No abnormal signal identified. Posterior Fossa, vertebral arteries, paraspinal tissues: Planum sphenoidale mass and retrocerebellar probable arachnoid cyst and better characterized on prior MRI. Disc levels: C2-3: Disc osteophyte complex and left-greater-than-right uncovertebral/ facet hypertrophy. Mild right and moderate to severe left foraminal narrowing. Moderate canal stenosis. C3-4: Moderate disc osteophyte complex and bilateral uncovertebral/ facet hypertrophy. Mild bilateral foraminal narrowing. Severe canal stenosis with cord flattening. C4-5: Discectomy, ossific ridging, and uncovertebral/facet hypertrophy. Mild canal stenosis. Poorly visualized neural foramen, probable mild-to-moderate stenosis. C5-6: Discectomy, ossific ridging, uncovertebral/facet hypertrophy. Mild canal stenosis. Poorly visualized neural foramen, probably mild-to-moderate stenosis. C6-7: Disc osteophyte complex and bilateral uncovertebral/ facet hypertrophy greater on the left with mild right and moderate left foraminal narrowing. Moderate canal stenosis with anterior cord flattening. C7-T1: No significant disc displacement, foraminal narrowing, or canal stenosis. IMPRESSION:  Moderate motion degradation. 1. Postsurgical changes related to anterior cervical fusion of C4 through C6. 2. Cervical spondylosis greatest at the C3-4 level where there is grade 1 anterolisthesis combined with a disc osteophyte complex, facet hypertrophy, and uncovertebral hypertrophy resulting in severe canal stenosis with cord flattening. 3. No  definite abnormal cord signal. 4. Planum sphenoidale mass, likely meningioma better characterized on same-day MRI. Electronically Signed   By: Kristine Garbe M.D.   On: 09/13/2016 02:48   Dg Chest Port 1 View  Result Date: 09/12/2016 CLINICAL DATA:  Left-sided numbness and weakness with shortness of breath EXAM: PORTABLE CHEST 1 VIEW COMPARISON:  05/17/ 2016 FINDINGS: Partially visualized cervical spine hardware. There is mild cardiomegaly with mild central vascular congestion. Mild airspace opacities are present in the right upper lobe and right greater than left lung bases, possible infiltrates. There is atherosclerosis of the aorta. No pneumothorax. IMPRESSION: 1. Mild cardiomegaly with mild central congestion. 2. Mild airspace opacities in the right upper lobe and right greater than left lung base could reflect infiltrates. Electronically Signed   By: Donavan Foil M.D.   On: 09/12/2016 23:43   Ct Head Code Stroke W/o Cm  Result Date: 09/12/2016 CLINICAL DATA:  Code stroke.  Left-sided weakness and left eye droop EXAM: CT HEAD WITHOUT CONTRAST TECHNIQUE: Contiguous axial images were obtained from the base of the skull through the vertex without intravenous contrast. COMPARISON:  02/01/2014 MR head and CT head. FINDINGS: Brain: No evidence of large acute infarct, focal mass effect, or intracranial hemorrhage. Stable ventricle size. No extra-axial collection. Stable foci of hypoattenuation in subcortical and periventricular white matter compatible with mild chronic microvascular ischemic changes mild brain parenchymal volume loss. Planum sphenoidale  mass measures 24 x 23 mm (AP by ML) and is increased in size from 2015 most compatible with meningioma. There is mass effect on the pre chiasmatic optic nerves and chiasm as well as the anterior third ventricle. Vascular: No hyperdense vessel. Calcific atherosclerosis of carotid siphons. Skull: Normal. Negative for fracture or focal lesion. Sinuses/Orbits: No acute finding. Bilateral intra-ocular lens replacement. Other: None. ASPECTS [facility] Stroke Program Early CT Score) - Ganglionic level infarction (caudate, lentiform nuclei, internal capsule, insula, M1-M3 cortex): 7 - Supraganglionic infarction (M4-M6 cortex): 3 Total score (0-10 with 10 being normal): 10 IMPRESSION: 1. No evidence for large acute infarct, focal mass effect, or intracranial hemorrhage. 2. Planum sphenoidale mass is increased in size from 2015 most consistent with meningioma. 3. ASPECTS is 10 These results were called by telephone at the time of interpretation on 09/12/2016 at 11:18 pm to Dr. Leonel Ramsay, who verbally acknowledged these results. Electronically Signed   By: Kristine Garbe M.D.   On: 09/12/2016 23:20    EKG: Independently reviewed. A. fib with RVR.  Assessment/Plan Principal Problem:   Lower extremity weakness Active Problems:   Hypertension   CKD (chronic kidney disease), stage III   Edema   Acute on chronic diastolic CHF (congestive heart failure) ([place])    1. Bilateral lower extremity weakness - I have discussed with neurologist Dr. Leonel Ramsay about the MRI C-spine finding showing severe canal stenosis. Dr. Leonel Ramsay as advised neurosurgery consult in a.m. As this can all stenosis could be causing patient's difficulty ambulating and neck pain. Patient is known to Dr. Joya Salm. Please consult Dr. Joya Salm in a.m. Physical therapy consult. 2. Possible new onset A. fib with RVR presently in sinus rhythm - I have requested cardiology consult. 3. Acute on chronic diastolic CHF last EF measured in April  2015 was 60-65% with grade 1 diastolic dysfunction - patient had received 80 mg of IV Lasix in the ER. I have placed patient on Lasix 40 mg IV every 12 hourly. Patient is also on metolazone at home. Closely follow intake and output metabolic panel daily weights and  2-D echo. 4. Diabetes mellitus type 2 on Lantus insulin and Januvia. Follow CBGs and sliding scale coverage. 5. Possible pneumonia - no clinical symptoms for pneumonia. Patient was started on antibiotics in the ER. I have ordered procalcitonin levels and if negative can discontinue antibiotic. 6. Chronic kidney disease stage III - creatinine appears to be at baseline. Closely follow metabolic panel. 7. Meningioma - will need follow-up with neurosurgery. 8. Anemia - follow CBC.   DVT prophylaxis: Lovenox. Code Status: Full code.  Family Communication: Patient's daughter.  Disposition Plan: To be determined.  Consults called: Cardiology and neurology.  Admission status: Inpatient.    Rise Patience MD [facility] Pager [phone].  If 7PM-7AM, please contact night-coverage [email or web address] Password [facility]  09/13/2016, 4:36 AM

## 2016-09-13 NOTE — ED Notes (Signed)
Pt transported to MRI. Dr Claudine Mouton in room and ordered to hold lasix until pt is back from MRI.

## 2016-09-13 NOTE — Progress Notes (Signed)
Patient arrived to floor via stretcher. Alert and oriented, patient is here with bilateral lower edema/ red/warm and edematous.  She was weak bilaterally. Oriented to unit equiptment; name tag verified. Bed alarm.  Safety maintained.

## 2016-09-13 NOTE — Progress Notes (Signed)
Ann Sellers is a 82 yo female with a hx of CHF. Who presents with Leg weakness, Son had not noticed any problems between 5 and 5:15. Other family arrived around 5:30 noticing pt was not "herself." They noticed a a slight Left eye droop and dragging her legs. Pt receiving a breathing treatment upon arrival to ED.  [facility] to admit LKW 1730, NIHSS 0, CBG 169

## 2016-09-13 NOTE — Progress Notes (Signed)
Patient seen and examined this morning, admitted earlier today by Dr. Glyn Ade, H&P is reviewed, previous assessment and plan.   In brief, this is a 82 year old female with history of diastolic CHF, diabetes, CKD, admitted to the hospital with bilateral lower extremity weakness left more than the right, and concern for stroke. She was initially brought in as a code stroke, neurology was consulted and code stroke was canceled. MRI of the brain was negative.   Bilateral lower extremity weakness - MRI of the C-spine shows severe canal stenosis with cord flattening. Patient has a history of anterior cervical fusion of C4 through C6 done by Dr. Joya Salm, discussed patient's care with Dr. Saintclair Halsted for neurosurgery, images reviewed, does not feel that there is anything acute from neurosurgical standpoint can be done now, and recommends physical therapy as mainstay of treatment and outpatient follow-up with Dr. Joya Salm - PT/OT  Possible community-acquired pneumonia - Basilar chest x-ray, patient also reports increased cough over the last few days. She was started on doxycycline, continue  Acute on chronic diastolic heart failure - Patient with evidence of fluid overload on chest x-ray as well as bilateral lower extremity edema, status post IV Lasix in the ED, continue IV Lasix for now. Strict I's and O's. Cardiology following  A. fib with RVR - Apparently new onset, score greater than 2, cardiology consulted - Troponin slightly elevated 0.17 >> 0.24, patient without any chest pain, likely demand  IDDM - Continue Lantus 20 units as well as sliding scale  Severe persistent chronic asthma with possible exacerbation - Patient with increased wheezing on exam, however this can be multifactorial in the setting of acute on chronic diastolic heart failure, cannot exclude acute asthma exacerbation, continue antibiotics as above, place patient on steroids  Chronic kidney disease stage III - creatinine appears to  be at baseline (~1.6). Closely follow metabolic panel.   Costin M. Cruzita Lederer, MD [facility] [phone]

## 2016-09-13 NOTE — ED Notes (Signed)
Pt assisted to bedside commode by this RN. Pt tolerated well but with assistance. Pt performed cleaning independently and assisted back to bed.

## 2016-09-13 NOTE — Consult Note (Signed)
Reason for Consult:   PAF  Requesting Physician: [facility] Primary Cardiologist New  HPI:   82 y/o obese female with a history of IDDM, CRI-3, asthma, chronic LE edema, and LE weakness, presented to the ED 09/12/16 with leg weakness, SOB, and transient Lt ptosis. CXR showed possible pneumonia. BNP was 100.  She was noted to be in AF with RVR-rate >150, new for her. A prior echo in April 2015 showed an EF of 60-65% with grade 1 DD. It was felt she had some degree of CHF and IV Lasix was administered in ED. Neurology was consulted for possible code stroke. CT and MRI did not reveal stroke (she did have an incidental finding of a meningioma). MRI of cervical spine showed severe stenosis (she has had prior neck surgery 2005). Since admission the pt has spontaneously converted to NSR. Antibiotics and steroids started in ED. This am she is weak but feels better. She could not tell her HR was irregular or fast, just SOB. She denies chest pain. She says she saw a cardiologist before her shoulder surgery in 2014 but there are no records in [facility] and she can't remember the physicians name.   PMHx:  Past Medical History:  Diagnosis Date  . Anemia   . Arthritis   . Asthma   . B12 deficiency   . Chronic kidney disease   . Diabetes mellitus ([place])   . Hypertension   . Neuromuscular disorder ([place])   . Right rotator cuff tear   . Wears dentures    top denture-bottom partial  . Wears glasses   . Wears hearing aid    both ears    Past Surgical History:  Procedure Laterality Date  . APPENDECTOMY  1945  . CATARACT EXTRACTION, BILATERAL  2008  . cervical stynosis  2005  . esophageal stretch  2001  . [place]  . herniated disc  1983  . left hand surgery  2011   gout  . left knee replacement  2002  . left knee surgery  1998  . right wrist fracture  2006  . SHOULDER ARTHROSCOPY WITH ROTATOR CUFF REPAIR Right 01/05/2013   Procedure: SHOULDER ARTHROSCOPY WITH ROTATOR CUFF REPAIR  subchromial decompression and distal clavical excision;  Surgeon: Lorn Junes, MD;  Location: [place];  Service: Orthopedics;  Laterality: Right;  . tonsillectomy  1936  . TONSILLECTOMY    . TYMPANOSTOMY TUBE PLACEMENT  1989  . VESICOVAGINAL FISTULA CLOSURE W/ TAH  1968    SOCHx:  reports that she quit smoking about 28 years ago. Her smoking use included Cigarettes. She has a 10.00 pack-year smoking history. She has never used smokeless tobacco. She reports that she drinks about 4.2 oz of alcohol per week . She reports that she does not use drugs.  FAMHx: Family History  Problem Relation Age of Onset  . Pneumonia Mother   . Diabetes Son   . Hypertension Son   . Heart disease Sister   . Hypertension Sister   . Diabetes Sister   . Hypertension Sister   . Diabetes Daughter     ALLERGIES: Allergies  Allergen Reactions  . Snake Antivenin [Antivenin Crotalidae Polyvalent (Snake Antivenin)] Other (See Comments)    unknown   ROS: Review of Systems: General: negative for chills, fever, night sweats or weight changes.  Cardiovascular: negative for chest pain, orthopnea, palpitations, paroxysmal nocturnal  HEENT: negative for any visual disturbances, blindness, glaucoma Dermatological: negative for rash Respiratory:  h/x asthma, recent cough, wheezing, and dyspnea Urologic: negative for hematuria or dysuria Abdominal: negative for nausea, vomiting, diarrhea, bright red blood per rectum, melena, or hematemesis Neurologic: negative for visual changes, syncope, or dizziness Musculoskeletal: negative for back pain, joint pain, or swelling Psych: cooperative and appropriate All other systems reviewed and are otherwise negative except as noted above.  HOME MEDICATIONS: Prior to Admission medications   Medication Sig Start Date End Date Taking? Authorizing Provider  albuterol (PROVENTIL HFA;VENTOLIN HFA) 108 (90 BASE) MCG/ACT inhaler Inhale 2 puffs into the lungs  every 6 (six) hours as needed for wheezing or shortness of breath.    Yes Historical Provider, MD  albuterol (PROVENTIL) (2.5 MG/3ML) 0.083% nebulizer solution Take 2.5 mg by nebulization every 6 (six) hours as needed for wheezing or shortness of breath.   Yes Historical Provider, MD  allopurinol (ZYLOPRIM) 100 MG tablet Take 100 mg by mouth daily.     Yes Historical Provider, MD  calcitRIOL (ROCALTROL) 0.25 MCG capsule Take 1 capsule by mouth daily. 06/28/15  Yes Historical Provider, MD  diclofenac sodium (VOLTAREN) 1 % GEL Apply 2 g topically daily as needed (pain).   Yes Historical Provider, MD  diphenoxylate-atropine (LOMOTIL) 2.5-0.025 MG per tablet Take 1 tablet by mouth daily as needed for diarrhea or loose stools.   Yes Historical Provider, MD  fexofenadine (ALLEGRA) 180 MG tablet Take 1 tablet (180 mg total) by mouth daily. 04/25/16  Yes Tanda Rockers, MD  furosemide (LASIX) 20 MG tablet Take 40 mg by mouth daily as needed for fluid.    Yes Historical Provider, MD  gabapentin (NEURONTIN) 300 MG capsule Take 300 mg by mouth 3 (three) times daily.   Yes Historical Provider, MD  HYDROcodone-acetaminophen (NORCO/VICODIN) 5-325 MG per tablet Take 0.5-1 tablets by mouth every 6 (six) hours as needed for moderate pain. 1/2 to 1 every 6 hours as needed 06/17/15  Yes Historical Provider, MD  insulin glargine (LANTUS) 100 UNIT/ML injection Inject 20 Units into the skin at bedtime.    Yes Historical Provider, MD  metolazone (ZAROXOLYN) 2.5 MG tablet Take 2.5 mg by mouth daily as needed (fluid).    Yes Historical Provider, MD  mometasone (NASONEX) 50 MCG/ACT nasal spray Place 2 sprays into the nose daily as needed (shortness of breath).    Yes Historical Provider, MD  mometasone-formoterol (DULERA) 100-5 MCG/ACT AERO Take 2 puffs first thing in am and then another 2 puffs about 12 hours later. Patient taking differently: Inhale 2 puffs into the lungs 2 (two) times daily as needed for wheezing or shortness of  breath.  08/22/15  Yes Tanda Rockers, MD  montelukast (SINGULAIR) 10 MG tablet Take 10 mg by mouth at bedtime.   Yes Historical Provider, MD  Multiple Vitamins-Minerals (ICAPS MV) TABS Take 2 tablets by mouth daily. Reported on 11/20/2015   Yes Historical Provider, MD  omeprazole (PRILOSEC) 20 MG capsule Take 30-60 min before last  meal of the day 11/27/15  Yes Tanda Rockers, MD  omeprazole (PRILOSEC) 40 MG capsule Take 30-60 min before first meal of the day 11/23/15  Yes Tanda Rockers, MD  predniSONE (DELTASONE) 20 MG tablet Take 20 mg by mouth daily as needed (wheezing, shortness of breath).   Yes Historical Provider, MD  Respiratory Therapy Supplies (FLUTTER) DEVI Use as directed 07/24/15  Yes Tanda Rockers, MD  sitaGLIPtin (JANUVIA) 100 MG tablet Take 100 mg by mouth daily. Reported on 11/20/2015   Yes Historical Provider, MD  tiZANidine (ZANAFLEX) 4 MG tablet Take 4 mg by mouth every 8 (eight) hours as needed for muscle spasms.    Yes Historical Provider, MD    HOSPITAL MEDICATIONS: . allopurinol  100 mg Oral Daily  . budesonide (PULMICORT) nebulizer solution  0.25 mg Nebulization BID  . calcitRIOL  0.25 mcg Oral Daily  . doxycycline (VIBRAMYCIN) IV  100 mg Intravenous Q12H  . enoxaparin (LOVENOX) injection  30 mg Subcutaneous Daily  . fluticasone  2 spray Each Nare Daily  . furosemide  40 mg Intravenous Q12H  . gabapentin  300 mg Oral TID  . insulin aspart  0-9 Units Subcutaneous TID WC  . insulin glargine  20 Units Subcutaneous QHS  . linagliptin  5 mg Oral Daily  . loratadine  10 mg Oral Daily  . methylPREDNISolone (SOLU-MEDROL) injection  60 mg Intravenous Q12H  . metoprolol tartrate  12.5 mg Oral BID  . mometasone-formoterol  2 puff Inhalation BID  . montelukast  10 mg Oral QHS  . multivitamin  1 tablet Oral Daily  . pantoprazole  40 mg Oral Daily  . sodium chloride flush  3 mL Intravenous Q12H     VITALS: Blood pressure (!) 138/56, pulse 94, temperature 98.3 F (36.8 C),  temperature source Oral, resp. rate 18, height 4' 10.5" (1.486 m), weight 201 lb 11.2 oz (91.5 kg), SpO2 98 %.  PHYSICAL EXAM: General appearance: alert, cooperative, no distress and morbidly obese Neck: no carotid bruit and no JVD Lungs: scattered rhonchi, some expiratory wheezing noted Heart: regular rate and rhythm and soft systolic murmur LSB Abdomen: obese, non tender Extremities: chronic edema skin changes Pulses: diminnished Skin: pale, cool, dry Neurologic: Grossly normal  LABS: Results for orders placed or performed during the hospital encounter of 09/12/16 (from the past 24 hour(s))  Protime-INR     Status: None   Collection Time: 09/12/16 11:02 PM  Result Value Ref Range   Prothrombin Time 12.4 11.4 - 15.2 seconds   INR 0.93   APTT     Status: None   Collection Time: 09/12/16 11:02 PM  Result Value Ref Range   aPTT 33 24 - 36 seconds  CBC     Status: Abnormal   Collection Time: 09/12/16 11:02 PM  Result Value Ref Range   WBC 9.2 4.0 - 10.5 K/uL   RBC 3.62 (L) 3.87 - 5.11 MIL/uL   Hemoglobin 10.8 (L) 12.0 - 15.0 g/dL   HCT 34.3 (L) 36.0 - 46.0 %   MCV 94.8 78.0 - 100.0 fL   MCH 29.8 26.0 - 34.0 pg   MCHC 31.5 30.0 - 36.0 g/dL   RDW 15.5 11.5 - 15.5 %   Platelets 233 150 - 400 K/uL  Differential     Status: None   Collection Time: 09/12/16 11:02 PM  Result Value Ref Range   Neutrophils Relative % 74 %   Neutro Abs 6.8 1.7 - 7.7 K/uL   Lymphocytes Relative 19 %   Lymphs Abs 1.8 0.7 - 4.0 K/uL   Monocytes Relative 5 %   Monocytes Absolute 0.5 0.1 - 1.0 K/uL   Eosinophils Relative 2 %   Eosinophils Absolute 0.2 0.0 - 0.7 K/uL   Basophils Relative 0 %   Basophils Absolute 0.0 0.0 - 0.1 K/uL  Comprehensive metabolic panel     Status: Abnormal   Collection Time: 09/12/16 11:02 PM  Result Value Ref Range   Sodium 140 135 - 145 mmol/L   Potassium 4.7 3.5 - 5.1 mmol/L  Chloride 107 101 - 111 mmol/L   CO2 22 22 - 32 mmol/L   Glucose, Bld 177 (H) 65 - 99 mg/dL    BUN 24 (H) 6 - 20 mg/dL   Creatinine, Ser 1.67 (H) 0.44 - 1.00 mg/dL   Calcium 9.0 8.9 - 10.3 mg/dL   Total Protein 6.6 6.5 - 8.1 g/dL   Albumin 4.0 3.5 - 5.0 g/dL   AST 18 15 - 41 U/L   ALT 13 (L) 14 - 54 U/L   Alkaline Phosphatase 85 38 - 126 U/L   Total Bilirubin 0.5 0.3 - 1.2 mg/dL   GFR calc non Af Amer 26 (L) >60 mL/min   GFR calc Af Amer 30 (L) >60 mL/min   Anion gap 11 5 - 15  Magnesium     Status: Abnormal   Collection Time: 09/12/16 11:04 PM  Result Value Ref Range   Magnesium 1.6 (L) 1.7 - 2.4 mg/dL  I-stat troponin, ED     Status: None   Collection Time: 09/12/16 11:06 PM  Result Value Ref Range   Troponin i, poc 0.02 0.00 - 0.08 ng/mL   Comment 3          I-Stat Chem 8, ED     Status: Abnormal   Collection Time: 09/12/16 11:10 PM  Result Value Ref Range   Sodium 142 135 - 145 mmol/L   Potassium 4.9 3.5 - 5.1 mmol/L   Chloride 107 101 - 111 mmol/L   BUN 30 (H) 6 - 20 mg/dL   Creatinine, Ser 1.60 (H) 0.44 - 1.00 mg/dL   Glucose, Bld 169 (H) 65 - 99 mg/dL   Calcium, Ion 1.10 (L) 1.15 - 1.40 mmol/L   TCO2 24 0 - 100 mmol/L   Hemoglobin 11.6 (L) 12.0 - 15.0 g/dL   HCT 34.0 (L) 36.0 - 46.0 %  Brain natriuretic peptide     Status: Abnormal   Collection Time: 09/12/16 11:25 PM  Result Value Ref Range   B Natriuretic Peptide 100.5 (H) 0.0 - 100.0 pg/mL  Comprehensive metabolic panel     Status: Abnormal   Collection Time: 09/13/16  5:03 AM  Result Value Ref Range   Sodium 142 135 - 145 mmol/L   Potassium 4.6 3.5 - 5.1 mmol/L   Chloride 108 101 - 111 mmol/L   CO2 22 22 - 32 mmol/L   Glucose, Bld 169 (H) 65 - 99 mg/dL   BUN 24 (H) 6 - 20 mg/dL   Creatinine, Ser 1.69 (H) 0.44 - 1.00 mg/dL   Calcium 8.8 (L) 8.9 - 10.3 mg/dL   Total Protein 6.0 (L) 6.5 - 8.1 g/dL   Albumin 3.5 3.5 - 5.0 g/dL   AST 16 15 - 41 U/L   ALT 13 (L) 14 - 54 U/L   Alkaline Phosphatase 68 38 - 126 U/L   Total Bilirubin 0.3 0.3 - 1.2 mg/dL   GFR calc non Af Amer 26 (L) >60 mL/min   GFR  calc Af Amer 30 (L) >60 mL/min   Anion gap 12 5 - 15  CBC WITH DIFFERENTIAL     Status: Abnormal   Collection Time: 09/13/16  5:03 AM  Result Value Ref Range   WBC 8.3 4.0 - 10.5 K/uL   RBC 3.27 (L) 3.87 - 5.11 MIL/uL   Hemoglobin 9.7 (L) 12.0 - 15.0 g/dL   HCT 30.9 (L) 36.0 - 46.0 %   MCV 94.5 78.0 - 100.0 fL   MCH 29.7 26.0 - 34.0  pg   MCHC 31.4 30.0 - 36.0 g/dL   RDW 15.6 (H) 11.5 - 15.5 %   Platelets 236 150 - 400 K/uL   Neutrophils Relative % 82 %   Neutro Abs 6.8 1.7 - 7.7 K/uL   Lymphocytes Relative 8 %   Lymphs Abs 0.7 0.7 - 4.0 K/uL   Monocytes Relative 9 %   Monocytes Absolute 0.7 0.1 - 1.0 K/uL   Eosinophils Relative 1 %   Eosinophils Absolute 0.0 0.0 - 0.7 K/uL   Basophils Relative 0 %   Basophils Absolute 0.0 0.0 - 0.1 K/uL  TSH     Status: None   Collection Time: 09/13/16  5:03 AM  Result Value Ref Range   TSH 2.062 0.350 - 4.500 uIU/mL  Troponin I (q 6hr x 3)     Status: Abnormal   Collection Time: 09/13/16  5:03 AM  Result Value Ref Range   Troponin I 0.17 (HH) <0.03 ng/mL  Procalcitonin - Baseline     Status: None   Collection Time: 09/13/16  5:03 AM  Result Value Ref Range   Procalcitonin <0.10 ng/mL  Glucose, capillary     Status: Abnormal   Collection Time: 09/13/16  8:40 AM  Result Value Ref Range   Glucose-Capillary 140 (H) 65 - 99 mg/dL   Comment 1 Notify RN   Troponin I (q 6hr x 3)     Status: Abnormal   Collection Time: 09/13/16 10:03 AM  Result Value Ref Range   Troponin I 0.24 (HH) <0.03 ng/mL    EKG: On adm 09/12/16- AF with RVR  IMAGING: Mr Brain Wo Contrast  Result Date: 09/13/2016 CLINICAL DATA:  82 y/o F; bilateral lower extremity weakness worse on the left. EXAM: MRI HEAD WITHOUT CONTRAST TECHNIQUE: Multiplanar, multiecho pulse sequences of the brain and surrounding structures were obtained without intravenous contrast. COMPARISON:  02/01/2014 MRI of the head. FINDINGS: Brain: No diffusion signal abnormality. Foci of T2 FLAIR  hyperintense signal abnormality in subcortical and periventricular white matter compatible with moderate chronic microvascular ischemic changes and stable. Moderate brain parenchymal volume loss slightly progressed from 2015. No abnormal foci of susceptibility hypointensity to indicate brain parenchymal hemorrhage. Prominent retrocerebellar extra-axial space greater on the left is stable from prior study compatible with a small arachnoid cyst. Planum sphenoidale extra-axial mass measuring 25 x 24 by 17 mm (AP x ML x CC), series 4, image 12 and series 10, image 18. Mass partially effaces the suprasellar cistern with contact on the left A1 segment superiorly and the optic chiasm and pre chiasmatic optic nerves intra draped laterally around the mass posteriorly (series 9, image 38 and series 10, image 18). Vascular: Normal flow voids. Skull and upper cervical spine: Normal marrow signal. Sinuses/Orbits: Partial opacification of right mastoid air cells and mild paranasal sinus mucosal thickening. Bilateral intra-ocular lens replacement. No abnormal signal of left mastoid air cells. Other: None. IMPRESSION: 1. No acute intracranial abnormality identified. 2. Moderate chronic microvascular ischemic changes and brain parenchymal volume loss, slightly progressed from 2015. 3. Planum sphenoidale extra-axial mass likely representing meningioma is mildly increased in size from 2015 and exerts mass effect on left A1 and on the optic chiasm. 4. Mild paranasal sinus disease. Electronically Signed   By: Kristine Garbe M.D.   On: 09/13/2016 02:04   Mr Cervical Spine Wo Contrast  Result Date: 09/13/2016 CLINICAL DATA:  82 y/o F; bilateral lower extremity weakness worse on the left. EXAM: MRI CERVICAL SPINE WITHOUT CONTRAST TECHNIQUE: Multiplanar, multisequence  MR imaging of the cervical spine was performed. No intravenous contrast was administered. COMPARISON:  09/13/2016 MRI of the head. FINDINGS: Moderate motion  degradation. Alignment: Straightening of cervical lordosis. Grade 1 C3-4 anterolisthesis. Vertebrae: C4 through C6 anterior fusion with susceptibility artifact from the fusion hardware partially obscuring the vertebral bodies at those levels. No evidence for fracture, diskitis, or suspicious bone lesion. Cord: No abnormal signal identified. Posterior Fossa, vertebral arteries, paraspinal tissues: Planum sphenoidale mass and retrocerebellar probable arachnoid cyst and better characterized on prior MRI. Disc levels: C2-3: Disc osteophyte complex and left-greater-than-right uncovertebral/ facet hypertrophy. Mild right and moderate to severe left foraminal narrowing. Moderate canal stenosis. C3-4: Moderate disc osteophyte complex and bilateral uncovertebral/ facet hypertrophy. Mild bilateral foraminal narrowing. Severe canal stenosis with cord flattening. C4-5: Discectomy, ossific ridging, and uncovertebral/facet hypertrophy. Mild canal stenosis. Poorly visualized neural foramen, probable mild-to-moderate stenosis. C5-6: Discectomy, ossific ridging, uncovertebral/facet hypertrophy. Mild canal stenosis. Poorly visualized neural foramen, probably mild-to-moderate stenosis. C6-7: Disc osteophyte complex and bilateral uncovertebral/ facet hypertrophy greater on the left with mild right and moderate left foraminal narrowing. Moderate canal stenosis with anterior cord flattening. C7-T1: No significant disc displacement, foraminal narrowing, or canal stenosis. IMPRESSION: Moderate motion degradation. 1. Postsurgical changes related to anterior cervical fusion of C4 through C6. 2. Cervical spondylosis greatest at the C3-4 level where there is grade 1 anterolisthesis combined with a disc osteophyte complex, facet hypertrophy, and uncovertebral hypertrophy resulting in severe canal stenosis with cord flattening. 3. No definite abnormal cord signal. 4. Planum sphenoidale mass, likely meningioma better characterized on same-day  MRI. Electronically Signed   By: Kristine Garbe M.D.   On: 09/13/2016 02:48   Dg Chest Port 1 View  Result Date: 09/12/2016 CLINICAL DATA:  Left-sided numbness and weakness with shortness of breath EXAM: PORTABLE CHEST 1 VIEW COMPARISON:  05/17/ 2016 FINDINGS: Partially visualized cervical spine hardware. There is mild cardiomegaly with mild central vascular congestion. Mild airspace opacities are present in the right upper lobe and right greater than left lung bases, possible infiltrates. There is atherosclerosis of the aorta. No pneumothorax. IMPRESSION: 1. Mild cardiomegaly with mild central congestion. 2. Mild airspace opacities in the right upper lobe and right greater than left lung base could reflect infiltrates. Electronically Signed   By: Donavan Foil M.D.   On: 09/12/2016 23:43   Ct Head Code Stroke W/o Cm  Result Date: 09/12/2016 CLINICAL DATA:  Code stroke.  Left-sided weakness and left eye droop EXAM: CT HEAD WITHOUT CONTRAST TECHNIQUE: Contiguous axial images were obtained from the base of the skull through the vertex without intravenous contrast. COMPARISON:  02/01/2014 MR head and CT head. FINDINGS: Brain: No evidence of large acute infarct, focal mass effect, or intracranial hemorrhage. Stable ventricle size. No extra-axial collection. Stable foci of hypoattenuation in subcortical and periventricular white matter compatible with mild chronic microvascular ischemic changes mild brain parenchymal volume loss. Planum sphenoidale mass measures 24 x 23 mm (AP by ML) and is increased in size from 2015 most compatible with meningioma. There is mass effect on the pre chiasmatic optic nerves and chiasm as well as the anterior third ventricle. Vascular: No hyperdense vessel. Calcific atherosclerosis of carotid siphons. Skull: Normal. Negative for fracture or focal lesion. Sinuses/Orbits: No acute finding. Bilateral intra-ocular lens replacement. Other: None. ASPECTS [facility] Stroke  Program Early CT Score) - Ganglionic level infarction (caudate, lentiform nuclei, internal capsule, insula, M1-M3 cortex): 7 - Supraganglionic infarction (M4-M6 cortex): 3 Total score (0-10 with 10 being normal): 10 IMPRESSION: 1.  No evidence for large acute infarct, focal mass effect, or intracranial hemorrhage. 2. Planum sphenoidale mass is increased in size from 2015 most consistent with meningioma. 3. ASPECTS is 10 These results were called by telephone at the time of interpretation on 09/12/2016 at 11:18 pm to Dr. Leonel Ramsay, who verbally acknowledged these results. Electronically Signed   By: Kristine Garbe M.D.   On: 09/12/2016 23:20    IMPRESSION: Principal Problem:   Lower extremity weakness Active Problems:   Hypertension   Insulin dependent diabetes mellitus with complications ([place])   Spinal stenosis   TIA (transient ischemic attack)   CKD (chronic kidney disease), stage III   Edema   Morbid obesity ([place])   Community acquired pneumonia   Acute on chronic diastolic CHF (congestive heart failure) (HCC)   RECOMMENDATION: Check 2D echo, TSH WNL, add low dose beta blocker (Lopressor 12.5 mg BID). Though this episode of AF could be considered "provoked" by CAP and respiratory distress the duration of her AF is really unknown. Consider anticoagulation- CHADs VASc=6. MD to see.   Time Spent Directly with Patient: [address] minutes  Kerin Ransom, [place] beeper 09/13/2016, 11:03 AM   The patient was seen, examined and discussed with Kerin Ransom, PA-C and I agree with the above.   82 y/o obese female with a history of IDDM, CRI-3, asthma, chronic LE edema, and LE weakness, presented to the ED 09/12/16 with leg weakness, SOB, and transient Lt ptosis. CXR showed possible pneumonia. BNP was 100.  She was noted to be in AF with RVR-rate >150, new for her. A prior echo in April 2015 showed an EF of 60-65% with grade 1 DD. Neurology was consulted for possible code stroke. CT and  MRI did not reveal stroke (she did have an incidental finding of a meningioma). MRI of cervical spine showed severe stenosis (she has had prior neck surgery 2005). Since admission the pt has spontaneously converted to NSR this morning.  Antibiotics and iv steroids started in ED. I will add albuterol/atrovent as she is in labored breathing and wheezing. Now in SR, we will monitor. She is CHADS-VASc 6, however, this is the first isolated episode of a-fib I a patient with anemia and significant history of nose bleeds and multiple cauterizations.  I agree with lasix 40 mg Q12H, Crea is improving 2.56 --> 1.69.  Mild troponin elevation is sec to demand ischemia, we won't plan for ischemic work up, she has had no chest pain.   Ena Dawley, MD 09/13/2016

## 2016-09-13 NOTE — Evaluation (Signed)
Physical Therapy Evaluation Patient Details Name: Ann Sellers MRN: [id number] DOB: 1925/03/25 Today's Date: 09/13/2016   History of Present Illness  82 y.o. female admitted to [facility] on 09/12/16 for LE weakness and progressive difficulty walking.  Pt dx with c-spine stenosis awaiting nerosurgery consult, possible new onset A-fib (cardiology following), acute on chronic diastolic CHF, and meningioma (neuro to f/u as it has changed in size), and anemia.  Pt with other significant PMHx R RTC tear s/p shoulder arthroscopy, CKD, DM, herniated disc, foot neuroma surgery, and L TKA.  Clinical Impression  Pt is generally weak, left leg greater than right, but able to walk down the hallway with min assist and RW.  She required less assistance the more we moved.  She has good family support and reports her PCP was supposed to order home therapy for her.  She would benefit from OT consult.   PT to follow acutely for deficits listed below.       Follow Up Recommendations Home health PT;Supervision/Assistance - 24 hour (24/7 assist the first few days after d/c, HHOT, aide)    Equipment Recommendations  None recommended by PT    Recommendations for Other Services   NA    Precautions / Restrictions Precautions Precautions: Fall Precaution Comments: pt admitts to falling at home      Mobility  Bed Mobility Overal bed mobility: Needs Assistance Bed Mobility: Supine to Sit     Supine to sit: Mod assist     General bed mobility comments: Mod assist to provide leverage and support at trunk to get to sitting EOB.  Pt admitts that she doesn't sleep in a bed because it is so difficult to get up out of it.  She sleeps in a recliner chair.   Transfers Overall transfer level: Needs assistance Equipment used: Rolling walker (2 wheeled) Transfers: Sit to/from Stand Sit to Stand: Min assist         General transfer comment: Min assist to support trunk during transitions.    Ambulation/Gait Ambulation/Gait assistance: Min assist Ambulation Distance (Feet): 75 Feet Assistive device: Rolling walker (2 wheeled) Gait Pattern/deviations: Step-through pattern;Shuffle Gait velocity: decreased Gait velocity interpretation: Below normal speed for age/gender General Gait Details: Verbal cues and manual assist for pt to remain inside/closer to RW for support.  Pt required less assist the further she went down the hallway.          Balance Overall balance assessment: Needs assistance Sitting-balance support: Feet supported;Bilateral upper extremity supported;No upper extremity supported Sitting balance-Leahy Scale: Good Sitting balance - Comments: Pt able to reach to floor from sitting to pick up something with supervision.    Standing balance support: Bilateral upper extremity supported Standing balance-Leahy Scale: Poor Standing balance comment: pt able to pull up her underwear with min assist and legs stabilized on the bed.                              Pertinent Vitals/Pain Pain Assessment: No/denies pain    Home Living Family/patient expects to be discharged to:: Private residence Living Arrangements: Children (son) Available Help at Discharge: Family;Available PRN/intermittently (per daughter son can take days off of work) Type of Home: House Home Access: Level entry     [place]: Two level;Bed/bath [place]: [place] - 4 wheels;Walker - 2 wheels;Grab bars - toilet;Grab bars - tub/shower      Prior Function Level of Independence: Independent;Independent with assistive device(s)  Comments: pt reports she normally furniture walks, uses RW when outside of the home, does her own bathing and dressing (uses stand up shower, doesn't want a shower chair because that means she has to get up from sitting and this is difficult for her).  She reports she sleeps in a recliner chair because it is easier to get out of than  her bed and it is difficult for her to sleep lying down (breathing).         Extremity/Trunk Assessment   Upper Extremity Assessment: Defer to OT evaluation           Lower Extremity Assessment: RLE deficits/detail;LLE deficits/detail RLE Deficits / Details: pt with functional weakness left greater than right.  Difficulty lifting her legs back into bed and difficulty getting to standing on her own.   LLE Deficits / Details: pt with functional weakness left greater than right.  Difficulty lifting her legs back into bed and difficulty getting to standing on her own.    Cervical / Trunk Assessment: Kyphotic  Communication   Communication: HOH  Cognition Arousal/Alertness: Awake/alert Behavior During Therapy: WFL for tasks assessed/performed Overall Cognitive Status: Within Functional Limits for tasks assessed                             Assessment/Plan    PT Assessment Patient needs continued PT services  PT Problem List Decreased strength;Decreased activity tolerance;Decreased balance;Decreased mobility;Decreased knowledge of use of DME          PT Treatment Interventions DME instruction;Gait training;Stair training;Functional mobility training;Therapeutic activities;Therapeutic exercise;Balance training;Patient/family education    PT Goals (Current goals can be found in the Care Plan section)  Acute Rehab PT Goals Patient Stated Goal: to remain independent PT Goal Formulation: With patient Time For Goal Achievement: 09/27/16 Potential to Achieve Goals: Good    Frequency Min 3X/week           End of Session Equipment Utilized During Treatment: Gait belt Activity Tolerance: Patient limited by fatigue Patient left: in bed;with call bell/phone within reach;with bed alarm set;with family/visitor present Nurse Communication: Mobility status         Time: 2094-7096 PT Time Calculation (min) (ACUTE ONLY): 30 min   Charges:   PT Evaluation $PT Eval  Moderate Complexity: 1 Procedure PT Treatments $Gait Training: 8-22 mins        Javonni Macke B. Tarshia Kot, PT, DPT [phone]   09/13/2016, 12:48 PM

## 2016-09-13 NOTE — Consult Note (Signed)
Neurology Consultation Reason for Consult: leg weakness Referring Physician: Claudine Mouton, A  CC: Leg WEakness  History is obtained from:Patient  HPI: Ann Sellers is a 82 y.o. female History of CHFWho presents with leg weakness. She had been around her son earlier today who had not noticed any problems, seeing her sometime between 7 and 5:15. Then around 5:30 when other family arrived, they noticed that she didn't look quite normal. They questioned a mild ptosis on the left. Throughout dinner, they felt that something was slightly off about her and then when she stood up to walk she was dragging her legs. She states that both her legs feel weak, but the left more so than the right. Because of the difficulty walking, she was brought into the emergency room.  On arrival, the EMS noticed that she was very short of breath and on arrival here, she appeared to be relatively short of breath as well.  She denies any numbness.She does complain of bilateral leg   She has a history of cervical spine   LKW: 5 PM tpa given?: no, Out of window    ROS: A 14 point ROS was performed and is negative except as noted in the HPI.   Past Medical History:  Diagnosis Date  . Anemia   . Arthritis   . Asthma   . B12 deficiency   . Chronic kidney disease   . Diabetes mellitus ([place])   . Hypertension   . Neuromuscular disorder ([place])   . Right rotator cuff tear   . Wears dentures    top denture-bottom partial  . Wears glasses   . Wears hearing aid    both ears     Family History  Problem Relation Age of Onset  . Pneumonia Mother   . Diabetes Son   . Hypertension Son   . Heart disease Sister   . Hypertension Sister   . Diabetes Sister   . Hypertension Sister   . Diabetes Daughter      Social History:  reports that she quit smoking about 28 years ago. Her smoking use included Cigarettes. She has a 10.00 pack-year smoking history. She has never used smokeless tobacco. She reports that she drinks  about 4.2 oz of alcohol per week . She reports that she does not use drugs.   Exam: Current vital signs: BP 152/87   Pulse (!) 121   Resp 25   Ht 4\' 11"  (1.499 m)   Wt 95 kg (209 lb 8 oz)   SpO2 92%   BMI 42.31 kg/m  Vital signs in last 24 hours: Pulse Rate:  [121-129] 121 (11/24 0000) Resp:  [25-29] 25 (11/24 0000) BP: (143-152)/(87-97) 152/87 (11/24 0000) SpO2:  [92 %-93 %] 92 % (11/24 0000) Weight:  [95 kg (209 lb 8 oz)] 95 kg (209 lb 8 oz) (11/23 2323)   Physical Exam  Constitutional: Appears well-developed and well-nourished.  Psych: Affect appropriate to situation Eyes: No scleral injection HENT: No OP obstrucion Head: Normocephalic.  Cardiovascular: Normal rate and regular rhythm.  Respiratory: Effort normal and breath sounds normal to anterior ascultation GI: Soft.  No distension. There is no tenderness.  Skin: WDI  Neuro: Mental Status: Patient is awake, alert, oriented to person, place, month, year, and situation. Patient is able to give a clear and coherent history. No signs of aphasia or neglect Cranial Nerves: II: Visual Fields are full. Pupils are equal, round, and reactive to light.   III,IV, VI: EOMI without ptosis or  diploplia.  V: Facial sensation is symmetric to temperature VII: Facial movement is symmetric.  VIII: hearing is intact to voice X: Uvula elevates symmetrically XI: Shoulder shrug is symmetric. XII: tongue is midline without atrophy or fasciculations.  Motor: Tone is normal. Bulk is normal. She appears to have some mild weakness, of her left >right legThough I am not certain if this represents true weakness vs. Pain and massive edema.  Sensory: Sensation is symmetric to light touch and temperature in the arms and legs. Deep Tendon Reflexes: 2+ and symmetric in the biceps and patellae and ankles.  Cerebellar: FNF intact bialterally  I have reviewed labs in epic and the results pertinent to this consultation are: Cr 1.67  I have  reviewed the images obtained: CT head - no acute changes, large likely meningioma.   Impression: 82 yo F with new leg weakness. I am not certain what to make of the drooping eyelid on the left reoprted by family as it does not seem to be present on my exam, and they deny any mouth involvement. At this point, I hitnk that it is possible this is all generalized weakness due to CHF exacerbation, but I would favor looking with MRI brain as well.   Given neck pain, may be reasonable to look at the c-spine as well.   Recommendations: 1) MRI brain, cervical spine.   Roland Rack, MD [facility] [phone]  If 7pm- 7am, please page neurology on call as listed in [place].

## 2016-09-14 ENCOUNTER — Inpatient Hospital Stay (HOSPITAL_COMMUNITY): Payer: Medicare Other

## 2016-09-14 ENCOUNTER — Inpatient Hospital Stay (HOSPITAL_COMMUNITY): Payer: Self-pay

## 2016-09-14 DIAGNOSIS — M4802 Spinal stenosis, cervical region: Secondary | ICD-10-CM

## 2016-09-14 DIAGNOSIS — M7989 Other specified soft tissue disorders: Secondary | ICD-10-CM

## 2016-09-14 DIAGNOSIS — I509 Heart failure, unspecified: Secondary | ICD-10-CM

## 2016-09-14 DIAGNOSIS — M79609 Pain in unspecified limb: Secondary | ICD-10-CM

## 2016-09-14 DIAGNOSIS — I350 Nonrheumatic aortic (valve) stenosis: Secondary | ICD-10-CM

## 2016-09-14 LAB — ECHOCARDIOGRAM COMPLETE
HEIGHTINCHES: 58.5 in
WEIGHTICAEL: 3176 [oz_av]

## 2016-09-14 LAB — GLUCOSE, CAPILLARY
Glucose-Capillary: 234 mg/dL — ABNORMAL HIGH (ref 65–99)
Glucose-Capillary: 295 mg/dL — ABNORMAL HIGH (ref 65–99)
Glucose-Capillary: 245 mg/dL — ABNORMAL HIGH (ref 65–99)
Glucose-Capillary: 174 mg/dL — ABNORMAL HIGH (ref 65–99)

## 2016-09-14 MED ORDER — IPRATROPIUM-ALBUTEROL 0.5-2.5 (3) MG/3ML IN SOLN
3.0000 mL | Freq: Three times a day (TID) | RESPIRATORY_TRACT | Status: DC
  Administered 2016-09-14 – 2016-09-17 (×8): 3 mL via RESPIRATORY_TRACT
  Filled 2016-09-14 (×9): qty 3

## 2016-09-14 MED ORDER — IPRATROPIUM-ALBUTEROL 0.5-2.5 (3) MG/3ML IN SOLN
3.0000 mL | Freq: Four times a day (QID) | RESPIRATORY_TRACT | Status: DC
  Administered 2016-09-14: 3 mL via RESPIRATORY_TRACT
  Filled 2016-09-14: qty 3

## 2016-09-14 MED ORDER — INSULIN ASPART 100 UNIT/ML ~~LOC~~ SOLN
3.0000 [IU] | Freq: Three times a day (TID) | SUBCUTANEOUS | Status: DC
  Administered 2016-09-14 – 2016-09-17 (×9): 3 [IU] via SUBCUTANEOUS

## 2016-09-14 MED ORDER — INSULIN GLARGINE 100 UNIT/ML ~~LOC~~ SOLN
25.0000 [IU] | Freq: Every day | SUBCUTANEOUS | Status: DC
  Administered 2016-09-14 – 2016-09-16 (×3): 25 [IU] via SUBCUTANEOUS
  Filled 2016-09-14 (×4): qty 0.25

## 2016-09-14 NOTE — Progress Notes (Signed)
PROGRESS NOTE  Ann Sellers KYH:[id number] DOB: 06/17/1925 DOA: 09/12/2016 PCP: Eloise Levels, NP   LOS: 1 day   Brief Narrative: In brief, this is a 82 year old female with history of diastolic CHF, diabetes, CKD, admitted to the hospital with bilateral lower extremity weakness left more than the right, and concern for stroke. She was initially brought in as a code stroke, neurology was consulted and code stroke was canceled. MRI of the brain was negative.  Assessment & Plan: Principal Problem:   Lower extremity weakness Active Problems:   Hypertension   Insulin dependent diabetes mellitus with complications (HCC)   Spinal stenosis   TIA (transient ischemic attack)   CKD (chronic kidney disease), stage III   Edema   Morbid obesity ([place])   Community acquired pneumonia   Acute on chronic diastolic CHF (congestive heart failure) (HCC)   Acute on chronic congestive heart failure (HCC)   Atrial fibrillation, new onset (HCC)   Bilateral lower extremity weakness - MRI of the C-spine shows severe canal stenosis with cord flattening. Patient has a history of anterior cervical fusion of C4 through C6 done by Dr. Joya Salm, discussed patient's care with Dr. Saintclair Halsted for neurosurgery, images reviewed, does not feel that there is anything acute from neurosurgical standpoint can be done now, and recommends physical therapy as mainstay of treatment and outpatient follow-up with Dr. Joya Salm - PT/OT  Possible community-acquired pneumonia - Basilar chest x-ray, patient also reports increased cough over the last few days. She was started on doxycycline, continue  Acute on chronic diastolic heart failure - Patient with evidence of fluid overload on chest x-ray as well as bilateral lower extremity edema, status post IV Lasix in the ED, continue diuresis. Strict I's and O's. Cardiology following - Continue IV Lasix for today  A. fib with RVR - Apparently new onset, score greater than 2, cardiology  consulted - Troponin slightly elevated 0.17 >> 0.24, patient without any chest pain, likely demand - Hold off anticoagulation given her anemia as prior nosebleeds status post multiple cauterizations,  IDDM - Continue Lantus 20 units as well as sliding scale - Poorly controlled CBGs, likely due to steroids, increase Lantus to 25 units and add NovoLog scheduled   Severe persistent chronic asthma with possible exacerbation - Patient with increased wheezing on exam, however this can be multifactorial in the setting of acute on chronic diastolic heart failure, cannot exclude acute asthma exacerbation, continue antibiotics as above, place patient on steroids - Wheezing improved, continue steroids   Chronic kidney disease stage III - creatinine appears to be at baseline (~1.6). Closely follow metabolic panel.   DVT prophylaxis: Lovenox Code Status: Full Family Communication: daughter bedside Disposition Plan: TBD  Consultants:   Cardiology   Procedures:   2D echo: pending  Antimicrobials:  Doxycycline 11/24 >>   Subjective: - no chest pain, shortness of breath, no abdominal pain, nausea or vomiting.  - feels a bit better today but weak  Objective: Vitals:   09/13/16 2147 09/14/16 0422 09/14/16 0424 09/14/16 0501  BP: (!) 142/46 (!) 124/49    Pulse: 91 72    Resp: 20 20    Temp: 98 F (36.7 C) 98.2 F (36.8 C)    TempSrc: Oral Oral    SpO2: 100% (!) 89% 97%   Weight:    90 kg (198 lb 8 oz)  Height:        Intake/Output Summary (Last 24 hours) at 09/14/16 1416 Last data filed at 09/13/16 2234  Gross  per 24 hour  Intake              590 ml  Output             1200 ml  Net             -610 ml   Filed Weights   09/12/16 2323 09/13/16 0300 09/14/16 0501  Weight: 95 kg (209 lb 8 oz) 91.5 kg (201 lb 11.2 oz) 90 kg (198 lb 8 oz)    Examination: Constitutional: NAD Vitals:   09/13/16 2147 09/14/16 0422 09/14/16 0424 09/14/16 0501  BP: (!) 142/46 (!) 124/49      Pulse: 91 72    Resp: 20 20    Temp: 98 F (36.7 C) 98.2 F (36.8 C)    TempSrc: Oral Oral    SpO2: 100% (!) 89% 97%   Weight:    90 kg (198 lb 8 oz)  Height:       Eyes: PERRL, lids and conjunctivae normal Respiratory: clear to auscultation bilaterally, no wheezing, no crackles. Normal respiratory effort. Overall decreased breath sounds Cardiovascular: Regular rate and rhythm, no murmurs / rubs / gallops. Trace LE edema. 2+ pedal pulses.  Abdomen: no tenderness. Bowel sounds positive.  Skin: bilateral shin erythematous rash improved Neurologic: non focal    Data Reviewed: I have personally reviewed following labs and imaging studies  CBC:  Recent Labs Lab 09/12/16 2302 09/12/16 2310 09/13/16 0503  WBC 9.2  --  8.3  NEUTROABS 6.8  --  6.8  HGB 10.8* 11.6* 9.7*  HCT 34.3* 34.0* 30.9*  MCV 94.8  --  94.5  PLT 233  --  242   Basic Metabolic Panel:  Recent Labs Lab 09/12/16 2302 09/12/16 2304 09/12/16 2310 09/13/16 0503  NA 140  --  142 142  K 4.7  --  4.9 4.6  CL 107  --  107 108  CO2 22  --   --  22  GLUCOSE 177*  --  169* 169*  BUN 24*  --  30* 24*  CREATININE 1.67*  --  1.60* 1.69*  CALCIUM 9.0  --   --  8.8*  MG  --  1.6*  --   --    GFR: Estimated Creatinine Clearance: 21.8 mL/min (by C-G formula based on SCr of 1.69 mg/dL (H)). Liver Function Tests:  Recent Labs Lab 09/12/16 2302 09/13/16 0503  AST 18 16  ALT 13* 13*  ALKPHOS 85 68  BILITOT 0.5 0.3  PROT 6.6 6.0*  ALBUMIN 4.0 3.5   No results for input(s): LIPASE, AMYLASE in the last 168 hours. No results for input(s): AMMONIA in the last 168 hours. Coagulation Profile:  Recent Labs Lab 09/12/16 2302  INR 0.93   Cardiac Enzymes:  Recent Labs Lab 09/13/16 0503 09/13/16 1003 09/13/16 1631  TROPONINI 0.17* 0.24* 0.14*   BNP (last 3 results) No results for input(s): PROBNP in the last 8760 hours. HbA1C: No results for input(s): HGBA1C in the last 72 hours. CBG:  Recent  Labs Lab 09/13/16 1149 09/13/16 1737 09/13/16 2131 09/14/16 0811 09/14/16 1208  GLUCAP 138* 221* 298* 234* 295*   Lipid Profile: No results for input(s): CHOL, HDL, LDLCALC, TRIG, CHOLHDL, LDLDIRECT in the last 72 hours. Thyroid Function Tests:  Recent Labs  09/13/16 0503  TSH 2.062   Anemia Panel: No results for input(s): VITAMINB12, FOLATE, FERRITIN, TIBC, IRON, RETICCTPCT in the last 72 hours. Urine analysis:    Component Value Date/Time  COLORURINE YELLOW 02/01/2014 [place] 02/01/2014 1732   LABSPEC 1.014 02/01/2014 1732   PHURINE 5.0 02/01/2014 1732   GLUCOSEU NEGATIVE 02/01/2014 1732   HGBUR NEGATIVE 02/01/2014 1732   BILIRUBINUR NEGATIVE 02/01/2014 1732   KETONESUR NEGATIVE 02/01/2014 1732   PROTEINUR NEGATIVE 02/01/2014 1732   UROBILINOGEN 0.2 02/01/2014 1732   NITRITE NEGATIVE 02/01/2014 1732   LEUKOCYTESUR NEGATIVE 02/01/2014 1732   Sepsis Labs: Invalid input(s): PROCALCITONIN, LACTICIDVEN  No results found for this or any previous visit (from the past 240 hour(s)).    Radiology Studies: Mr Brain 69 Contrast  Result Date: 09/13/2016 CLINICAL DATA:  82 y/o F; bilateral lower extremity weakness worse on the left. EXAM: MRI HEAD WITHOUT CONTRAST TECHNIQUE: Multiplanar, multiecho pulse sequences of the brain and surrounding structures were obtained without intravenous contrast. COMPARISON:  02/01/2014 MRI of the head. FINDINGS: Brain: No diffusion signal abnormality. Foci of T2 FLAIR hyperintense signal abnormality in subcortical and periventricular white matter compatible with moderate chronic microvascular ischemic changes and stable. Moderate brain parenchymal volume loss slightly progressed from 2015. No abnormal foci of susceptibility hypointensity to indicate brain parenchymal hemorrhage. Prominent retrocerebellar extra-axial space greater on the left is stable from prior study compatible with a small arachnoid cyst. Planum sphenoidale  extra-axial mass measuring 25 x 24 by 17 mm (AP x ML x CC), series 4, image 12 and series 10, image 18. Mass partially effaces the suprasellar cistern with contact on the left A1 segment superiorly and the optic chiasm and pre chiasmatic optic nerves intra draped laterally around the mass posteriorly (series 9, image 38 and series 10, image 18). Vascular: Normal flow voids. Skull and upper cervical spine: Normal marrow signal. Sinuses/Orbits: Partial opacification of right mastoid air cells and mild paranasal sinus mucosal thickening. Bilateral intra-ocular lens replacement. No abnormal signal of left mastoid air cells. Other: None. IMPRESSION: 1. No acute intracranial abnormality identified. 2. Moderate chronic microvascular ischemic changes and brain parenchymal volume loss, slightly progressed from 2015. 3. Planum sphenoidale extra-axial mass likely representing meningioma is mildly increased in size from 2015 and exerts mass effect on left A1 and on the optic chiasm. 4. Mild paranasal sinus disease. Electronically Signed   By: Kristine Garbe M.D.   On: 09/13/2016 02:04   Mr Cervical Spine Wo Contrast  Result Date: 09/13/2016 CLINICAL DATA:  82 y/o F; bilateral lower extremity weakness worse on the left. EXAM: MRI CERVICAL SPINE WITHOUT CONTRAST TECHNIQUE: Multiplanar, multisequence MR imaging of the cervical spine was performed. No intravenous contrast was administered. COMPARISON:  09/13/2016 MRI of the head. FINDINGS: Moderate motion degradation. Alignment: Straightening of cervical lordosis. Grade 1 C3-4 anterolisthesis. Vertebrae: C4 through C6 anterior fusion with susceptibility artifact from the fusion hardware partially obscuring the vertebral bodies at those levels. No evidence for fracture, diskitis, or suspicious bone lesion. Cord: No abnormal signal identified. Posterior Fossa, vertebral arteries, paraspinal tissues: Planum sphenoidale mass and retrocerebellar probable arachnoid cyst  and better characterized on prior MRI. Disc levels: C2-3: Disc osteophyte complex and left-greater-than-right uncovertebral/ facet hypertrophy. Mild right and moderate to severe left foraminal narrowing. Moderate canal stenosis. C3-4: Moderate disc osteophyte complex and bilateral uncovertebral/ facet hypertrophy. Mild bilateral foraminal narrowing. Severe canal stenosis with cord flattening. C4-5: Discectomy, ossific ridging, and uncovertebral/facet hypertrophy. Mild canal stenosis. Poorly visualized neural foramen, probable mild-to-moderate stenosis. C5-6: Discectomy, ossific ridging, uncovertebral/facet hypertrophy. Mild canal stenosis. Poorly visualized neural foramen, probably mild-to-moderate stenosis. C6-7: Disc osteophyte complex and bilateral uncovertebral/ facet hypertrophy greater on the left  with mild right and moderate left foraminal narrowing. Moderate canal stenosis with anterior cord flattening. C7-T1: No significant disc displacement, foraminal narrowing, or canal stenosis. IMPRESSION: Moderate motion degradation. 1. Postsurgical changes related to anterior cervical fusion of C4 through C6. 2. Cervical spondylosis greatest at the C3-4 level where there is grade 1 anterolisthesis combined with a disc osteophyte complex, facet hypertrophy, and uncovertebral hypertrophy resulting in severe canal stenosis with cord flattening. 3. No definite abnormal cord signal. 4. Planum sphenoidale mass, likely meningioma better characterized on same-day MRI. Electronically Signed   By: Kristine Garbe M.D.   On: 09/13/2016 02:48   Dg Chest Port 1 View  Result Date: 09/12/2016 CLINICAL DATA:  Left-sided numbness and weakness with shortness of breath EXAM: PORTABLE CHEST 1 VIEW COMPARISON:  05/17/ 2016 FINDINGS: Partially visualized cervical spine hardware. There is mild cardiomegaly with mild central vascular congestion. Mild airspace opacities are present in the right upper lobe and right greater than  left lung bases, possible infiltrates. There is atherosclerosis of the aorta. No pneumothorax. IMPRESSION: 1. Mild cardiomegaly with mild central congestion. 2. Mild airspace opacities in the right upper lobe and right greater than left lung base could reflect infiltrates. Electronically Signed   By: Donavan Foil M.D.   On: 09/12/2016 23:43   Ct Head Code Stroke W/o Cm  Result Date: 09/12/2016 CLINICAL DATA:  Code stroke.  Left-sided weakness and left eye droop EXAM: CT HEAD WITHOUT CONTRAST TECHNIQUE: Contiguous axial images were obtained from the base of the skull through the vertex without intravenous contrast. COMPARISON:  02/01/2014 MR head and CT head. FINDINGS: Brain: No evidence of large acute infarct, focal mass effect, or intracranial hemorrhage. Stable ventricle size. No extra-axial collection. Stable foci of hypoattenuation in subcortical and periventricular white matter compatible with mild chronic microvascular ischemic changes mild brain parenchymal volume loss. Planum sphenoidale mass measures 24 x 23 mm (AP by ML) and is increased in size from 2015 most compatible with meningioma. There is mass effect on the pre chiasmatic optic nerves and chiasm as well as the anterior third ventricle. Vascular: No hyperdense vessel. Calcific atherosclerosis of carotid siphons. Skull: Normal. Negative for fracture or focal lesion. Sinuses/Orbits: No acute finding. Bilateral intra-ocular lens replacement. Other: None. ASPECTS [facility] Stroke Program Early CT Score) - Ganglionic level infarction (caudate, lentiform nuclei, internal capsule, insula, M1-M3 cortex): 7 - Supraganglionic infarction (M4-M6 cortex): 3 Total score (0-10 with 10 being normal): 10 IMPRESSION: 1. No evidence for large acute infarct, focal mass effect, or intracranial hemorrhage. 2. Planum sphenoidale mass is increased in size from 2015 most consistent with meningioma. 3. ASPECTS is 10 These results were called by telephone at the time of  interpretation on 09/12/2016 at 11:18 pm to Dr. Leonel Ramsay, who verbally acknowledged these results. Electronically Signed   By: Kristine Garbe M.D.   On: 09/12/2016 23:20     Scheduled Meds: . allopurinol  100 mg Oral Daily  . budesonide (PULMICORT) nebulizer solution  0.25 mg Nebulization BID  . calcitRIOL  0.25 mcg Oral Daily  . doxycycline (VIBRAMYCIN) IV  100 mg Intravenous Q12H  . enoxaparin (LOVENOX) injection  30 mg Subcutaneous Daily  . fluticasone  2 spray Each Nare Daily  . furosemide  40 mg Intravenous Q12H  . gabapentin  300 mg Oral TID  . insulin aspart  0-9 Units Subcutaneous TID WC  . insulin glargine  20 Units Subcutaneous QHS  . ipratropium-albuterol  3 mL Nebulization TID  . linagliptin  5 mg  Oral Daily  . loratadine  10 mg Oral Daily  . methylPREDNISolone (SOLU-MEDROL) injection  60 mg Intravenous Q12H  . metoprolol tartrate  12.5 mg Oral BID  . mometasone-formoterol  2 puff Inhalation BID  . montelukast  10 mg Oral QHS  . multivitamin  1 tablet Oral Daily  . pantoprazole  40 mg Oral Daily  . sodium chloride flush  3 mL Intravenous Q12H   Continuous Infusions:   Marzetta Board, MD, PhD [facility] Pager [phone]  If 7PM-7AM, please contact night-coverage [email or web address] Password [email or web address] 09/14/2016, 2:16 PM

## 2016-09-14 NOTE — Progress Notes (Signed)
Patient Name: Ann Sellers Date of Encounter: 09/14/2016  Primary Cardiologist: Dr. Jessie Foot Problem List     Principal Problem:   Lower extremity weakness Active Problems:   Hypertension   Insulin dependent diabetes mellitus with complications [facility])   Spinal stenosis   TIA (transient ischemic attack)   CKD (chronic kidney disease), stage III   Edema   Morbid obesity ([place])   Community acquired pneumonia   Acute on chronic diastolic CHF (congestive heart failure) (HCC)   Acute on chronic congestive heart failure (HCC)   Atrial fibrillation, new onset ([place])    Subjective   Tired, has not slept well. No palpitations or chest pain.  Inpatient Medications    Scheduled Meds: . allopurinol  100 mg Oral Daily  . budesonide (PULMICORT) nebulizer solution  0.25 mg Nebulization BID  . calcitRIOL  0.25 mcg Oral Daily  . doxycycline (VIBRAMYCIN) IV  100 mg Intravenous Q12H  . enoxaparin (LOVENOX) injection  30 mg Subcutaneous Daily  . fluticasone  2 spray Each Nare Daily  . furosemide  40 mg Intravenous Q12H  . gabapentin  300 mg Oral TID  . insulin aspart  0-9 Units Subcutaneous TID WC  . insulin glargine  20 Units Subcutaneous QHS  . ipratropium-albuterol  3 mL Nebulization TID  . linagliptin  5 mg Oral Daily  . loratadine  10 mg Oral Daily  . methylPREDNISolone (SOLU-MEDROL) injection  60 mg Intravenous Q12H  . metoprolol tartrate  12.5 mg Oral BID  . mometasone-formoterol  2 puff Inhalation BID  . montelukast  10 mg Oral QHS  . multivitamin  1 tablet Oral Daily  . pantoprazole  40 mg Oral Daily  . sodium chloride flush  3 mL Intravenous Q12H    PRN Meds: acetaminophen **OR** acetaminophen, HYDROcodone-acetaminophen, levalbuterol, metolazone, ondansetron **OR** ondansetron (ZOFRAN) IV, tiZANidine   Vital Signs    Vitals:   09/13/16 2147 09/14/16 0422 09/14/16 0424 09/14/16 0501  BP: (!) 142/46 (!) 124/49    Pulse: 91 72    Resp: 20 20      Temp: 98 F (36.7 C) 98.2 F (36.8 C)    TempSrc: Oral Oral    SpO2: 100% (!) 89% 97%   Weight:    198 lb 8 oz (90 kg)  Height:        Intake/Output Summary (Last 24 hours) at 09/14/16 0902 Last data filed at 09/13/16 2234  Gross per 24 hour  Intake              840 ml  Output             1200 ml  Net             -360 ml   Filed Weights   09/12/16 2323 09/13/16 0300 09/14/16 0501  Weight: 209 lb 8 oz (95 kg) 201 lb 11.2 oz (91.5 kg) 198 lb 8 oz (90 kg)    Physical Exam    Gen.: Elderly woman, no distress. HEENT: Conjunctiva and lids normal, oropharynx clear. Neck: Supple, no elevated JVP or carotid bruits, no thyromegaly. Lungs: Clear to auscultation, nonlabored breathing at rest. Cardiac: Regular rate and rhythm, no S3, 2/6 systolic murmur, no pericardial rub. Abdomen: Soft, nontender, bowel sounds present, no guarding or rebound. Extremities: Mild leg edema, distal pulses 2+.  Labs    CBC  Recent Labs  09/12/16 2302 09/12/16 2310 09/13/16 0503  WBC 9.2  --  8.3  NEUTROABS 6.8  --  6.8  HGB 10.8* 11.6* 9.7*  HCT 34.3* 34.0* 30.9*  MCV 94.8  --  94.5  PLT 233  --  546   Basic Metabolic Panel  Recent Labs  09/12/16 2302 09/12/16 2304 09/12/16 2310 09/13/16 0503  NA 140  --  142 142  K 4.7  --  4.9 4.6  CL 107  --  107 108  CO2 22  --   --  22  GLUCOSE 177*  --  169* 169*  BUN 24*  --  30* 24*  CREATININE 1.67*  --  1.60* 1.69*  CALCIUM 9.0  --   --  8.8*  MG  --  1.6*  --   --    Liver Function Tests  Recent Labs  09/12/16 2302 09/13/16 0503  AST 18 16  ALT 13* 13*  ALKPHOS 85 68  BILITOT 0.5 0.3  PROT 6.6 6.0*  ALBUMIN 4.0 3.5   Cardiac Enzymes  Recent Labs  09/13/16 0503 09/13/16 1003 09/13/16 1631  TROPONINI 0.17* 0.24* 0.14*   Thyroid Function Tests  Recent Labs  09/13/16 0503  TSH 2.062    Telemetry    I personally reviewed telemetry monitoring which shows normal sinus rhythm.  Radiology    Mr Brain Wo  Contrast  Result Date: 09/13/2016 CLINICAL DATA:  82 y/o F; bilateral lower extremity weakness worse on the left. EXAM: MRI HEAD WITHOUT CONTRAST TECHNIQUE: Multiplanar, multiecho pulse sequences of the brain and surrounding structures were obtained without intravenous contrast. COMPARISON:  02/01/2014 MRI of the head. FINDINGS: Brain: No diffusion signal abnormality. Foci of T2 FLAIR hyperintense signal abnormality in subcortical and periventricular white matter compatible with moderate chronic microvascular ischemic changes and stable. Moderate brain parenchymal volume loss slightly progressed from 2015. No abnormal foci of susceptibility hypointensity to indicate brain parenchymal hemorrhage. Prominent retrocerebellar extra-axial space greater on the left is stable from prior study compatible with a small arachnoid cyst. Planum sphenoidale extra-axial mass measuring 25 x 24 by 17 mm (AP x ML x CC), series 4, image 12 and series 10, image 18. Mass partially effaces the suprasellar cistern with contact on the left A1 segment superiorly and the optic chiasm and pre chiasmatic optic nerves intra draped laterally around the mass posteriorly (series 9, image 38 and series 10, image 18). Vascular: Normal flow voids. Skull and upper cervical spine: Normal marrow signal. Sinuses/Orbits: Partial opacification of right mastoid air cells and mild paranasal sinus mucosal thickening. Bilateral intra-ocular lens replacement. No abnormal signal of left mastoid air cells. Other: None. IMPRESSION: 1. No acute intracranial abnormality identified. 2. Moderate chronic microvascular ischemic changes and brain parenchymal volume loss, slightly progressed from 2015. 3. Planum sphenoidale extra-axial mass likely representing meningioma is mildly increased in size from 2015 and exerts mass effect on left A1 and on the optic chiasm. 4. Mild paranasal sinus disease. Electronically Signed   By: Kristine Garbe M.D.   On:  09/13/2016 02:04   Mr Cervical Spine Wo Contrast  Result Date: 09/13/2016 CLINICAL DATA:  82 y/o F; bilateral lower extremity weakness worse on the left. EXAM: MRI CERVICAL SPINE WITHOUT CONTRAST TECHNIQUE: Multiplanar, multisequence MR imaging of the cervical spine was performed. No intravenous contrast was administered. COMPARISON:  09/13/2016 MRI of the head. FINDINGS: Moderate motion degradation. Alignment: Straightening of cervical lordosis. Grade 1 C3-4 anterolisthesis. Vertebrae: C4 through C6 anterior fusion with susceptibility artifact from the fusion hardware partially obscuring the vertebral bodies at those levels. No evidence for fracture, diskitis, or suspicious  bone lesion. Cord: No abnormal signal identified. Posterior Fossa, vertebral arteries, paraspinal tissues: Planum sphenoidale mass and retrocerebellar probable arachnoid cyst and better characterized on prior MRI. Disc levels: C2-3: Disc osteophyte complex and left-greater-than-right uncovertebral/ facet hypertrophy. Mild right and moderate to severe left foraminal narrowing. Moderate canal stenosis. C3-4: Moderate disc osteophyte complex and bilateral uncovertebral/ facet hypertrophy. Mild bilateral foraminal narrowing. Severe canal stenosis with cord flattening. C4-5: Discectomy, ossific ridging, and uncovertebral/facet hypertrophy. Mild canal stenosis. Poorly visualized neural foramen, probable mild-to-moderate stenosis. C5-6: Discectomy, ossific ridging, uncovertebral/facet hypertrophy. Mild canal stenosis. Poorly visualized neural foramen, probably mild-to-moderate stenosis. C6-7: Disc osteophyte complex and bilateral uncovertebral/ facet hypertrophy greater on the left with mild right and moderate left foraminal narrowing. Moderate canal stenosis with anterior cord flattening. C7-T1: No significant disc displacement, foraminal narrowing, or canal stenosis. IMPRESSION: Moderate motion degradation. 1. Postsurgical changes related to  anterior cervical fusion of C4 through C6. 2. Cervical spondylosis greatest at the C3-4 level where there is grade 1 anterolisthesis combined with a disc osteophyte complex, facet hypertrophy, and uncovertebral hypertrophy resulting in severe canal stenosis with cord flattening. 3. No definite abnormal cord signal. 4. Planum sphenoidale mass, likely meningioma better characterized on same-day MRI. Electronically Signed   By: Kristine Garbe M.D.   On: 09/13/2016 02:48   Dg Chest Port 1 View  Result Date: 09/12/2016 CLINICAL DATA:  Left-sided numbness and weakness with shortness of breath EXAM: PORTABLE CHEST 1 VIEW COMPARISON:  05/17/ 2016 FINDINGS: Partially visualized cervical spine hardware. There is mild cardiomegaly with mild central vascular congestion. Mild airspace opacities are present in the right upper lobe and right greater than left lung bases, possible infiltrates. There is atherosclerosis of the aorta. No pneumothorax. IMPRESSION: 1. Mild cardiomegaly with mild central congestion. 2. Mild airspace opacities in the right upper lobe and right greater than left lung base could reflect infiltrates. Electronically Signed   By: Donavan Foil M.D.   On: 09/12/2016 23:43   Ct Head Code Stroke W/o Cm  Result Date: 09/12/2016 CLINICAL DATA:  Code stroke.  Left-sided weakness and left eye droop EXAM: CT HEAD WITHOUT CONTRAST TECHNIQUE: Contiguous axial images were obtained from the base of the skull through the vertex without intravenous contrast. COMPARISON:  02/01/2014 MR head and CT head. FINDINGS: Brain: No evidence of large acute infarct, focal mass effect, or intracranial hemorrhage. Stable ventricle size. No extra-axial collection. Stable foci of hypoattenuation in subcortical and periventricular white matter compatible with mild chronic microvascular ischemic changes mild brain parenchymal volume loss. Planum sphenoidale mass measures 24 x 23 mm (AP by ML) and is increased in size from  2015 most compatible with meningioma. There is mass effect on the pre chiasmatic optic nerves and chiasm as well as the anterior third ventricle. Vascular: No hyperdense vessel. Calcific atherosclerosis of carotid siphons. Skull: Normal. Negative for fracture or focal lesion. Sinuses/Orbits: No acute finding. Bilateral intra-ocular lens replacement. Other: None. ASPECTS [facility] Stroke Program Early CT Score) - Ganglionic level infarction (caudate, lentiform nuclei, internal capsule, insula, M1-M3 cortex): 7 - Supraganglionic infarction (M4-M6 cortex): 3 Total score (0-10 with 10 being normal): 10 IMPRESSION: 1. No evidence for large acute infarct, focal mass effect, or intracranial hemorrhage. 2. Planum sphenoidale mass is increased in size from 2015 most consistent with meningioma. 3. ASPECTS is 10 These results were called by telephone at the time of interpretation on 09/12/2016 at 11:18 pm to Dr. Leonel Ramsay, who verbally acknowledged these results. Electronically Signed   By: Kristine Garbe  M.D.   On: 09/12/2016 23:20    Cardiac Studies   Echocardiogram pending.  Patient Profile     82 year old woman with history of CKD stage 3, type 2 diabetes mellitus, chronic leg edema, now admitted with leg weakness, shortness of breath, and possible pneumonia. Head CT and MRI did not reveal acute stroke. She does have cervical spine stenosis. She was noted have transient rapid atrial fibrillation which spontaneously resolved. She was seen in consultation by Dr. Meda Coffee on November 24. Currently on IV Lasix with follow-up echocardiogram pending.  Assessment & Plan    1. Transient atrial fibrillation with RVR, maintaining sinus rhythm having spontaneously converted. He is currently on Lopressor. CHADSVASC score is 6. Decision to hold off on anticoagulation at this point in light of anemia as well as history of nose bleeds and multiple cauterizations. If she has recurring episodes this will need to be  reconsidered.  2. Essential hypertension, systolic blood pressure 820S to 140s.  3. Acute on chronic diastolic heart failure, currently on IV Lasix.  4. Moderate aortic valve stenosis by echo [place] in 2015. Follow-up study is pending.  I reviewed Dr. Francesca Oman consultation note and recommendations from yesterday. Continue IV Lasix for now. We will follow up on echocardiogram to reassess degree of aortic valve stenosis.  Signed, Satira Sark, M.D., F.A.C.C.  09/14/2016, 9:02 AM

## 2016-09-14 NOTE — Progress Notes (Signed)
  Echocardiogram 2D Echocardiogram has been performed.  Darlina Sicilian M 09/14/2016, 1:26 PM

## 2016-09-14 NOTE — Progress Notes (Addendum)
Neurology Consultation Follow Up Note Reason for Consult: leg weakness Referring Physician: Claudine Mouton, A  CC: Leg WEakness  History is obtained from:Patient  HPI: Ann Sellers is a 82 y.o. female History of CHFWho presents with leg weakness. She had been around her son earlier today who had not noticed any problems, seeing her sometime between 19 and 5:15. Then around 5:30 when other family arrived, they noticed that she didn't look quite normal. They questioned a mild ptosis on the left. Throughout dinner, they felt that something was slightly off about her and then when she stood up to walk she was dragging her legs. She states that both her legs feel weak, but the left more so than the right. Because of the difficulty walking, she was brought into the emergency room.  Interval history: -PT without any new complaints  -C spine showing severe canal narrowing. Neurosurgery recommends outpt eval.   Past Medical History:  Diagnosis Date  . Anemia   . Arthritis   . Asthma   . B12 deficiency   . Chronic kidney disease   . Diabetes mellitus ([place])   . Hypertension   . Neuromuscular disorder ([place])   . Right rotator cuff tear   . Wears dentures    top denture-bottom partial  . Wears glasses   . Wears hearing aid    both ears   Exam: Current vital signs: BP (!) 124/49 (BP Location: Right Arm)   Pulse 72   Temp 98.2 F (36.8 C) (Oral)   Resp 20   Ht 4' 10.5" (1.486 m)   Wt 90 kg (198 lb 8 oz)   SpO2 97%   BMI 40.78 kg/m  Vital signs in last 24 hours: Temp:  [98 F (36.7 C)-98.4 F (36.9 C)] 98.2 F (36.8 C) (11/25 0422) Pulse Rate:  [72-92] 72 (11/25 0422) Resp:  [20] 20 (11/25 0422) BP: (124-142)/(46-51) 124/49 (11/25 0422) SpO2:  [89 %-100 %] 97 % (11/25 0424) Weight:  [90 kg (198 lb 8 oz)] 90 kg (198 lb 8 oz) (11/25 0501)   Physical Exam  Constitutional: Appears well-developed and well-nourished.  Psych: Affect appropriate to situation Eyes: No scleral injection HENT:  No OP obstrucion Head: Normocephalic.  Cardiovascular: Normal rate and regular rhythm.  Respiratory: Effort normal and breath sounds normal to anterior ascultation GI: Soft.  No distension. There is no tenderness.  Skin: WDI, BLE with erythema and mild edema   Neuro: Mental Status: Patient is awake, alert, oriented to person, place, month, year, and situation. Patient is able to give a clear and coherent history. No signs of aphasia or neglect Cranial Nerves: II: Visual Fields are full. Pupils are equal, round, and reactive to light.   III,IV, VI: EOMI without ptosis or diploplia.  V: Facial sensation is symmetric to temperature VII: Facial movement is symmetric.  VIII: hearing is intact to voice X: Uvula elevates symmetrically XI: Shoulder shrug is symmetric. XII: tongue is midline without atrophy or fasciculations.  Motor: Tone is normal. Bulk is normal. Strength 5/5 today  Sensory: Sensation is symmetric to light touch and temperature in the arms and legs. Deep Tendon Reflexes: 2+ and symmetric in the biceps and patellae and ankles.  Cerebellar: FNF intact bialterally  I have reviewed labs in epic and the results pertinent to this consultation are: No new labs   I have reviewed the images obtained:   MRI Brain w/out 11/24: No acute processes. Chronic small vessel disease. Meningioma.   MRI C spine w/o  11/24: Severe canal stenosis C3/C4.   Impression: 82 yo F with leg weakness that has resolved. She has cervical spine stenosis. IT is possible that this is exacerbated in the setting of deconditioning as well as her having an active CHF exacerbation.   Recommendations: 1) FU with neurosurgery as an outpt for further evaluation of potential intervention 2) Neurology will sign off at this time.   Loney Hering, D.O.  [facility] [phone]  If 7pm- 7am, please page neurology on call as listed in [place].

## 2016-09-14 NOTE — Progress Notes (Signed)
VASCULAR LAB PRELIMINARY  PRELIMINARY  PRELIMINARY  PRELIMINARY  Bilateral lower extremity venous duplex completed.  Preliminary report: There is no obvious evidence of DVT or SVT noted in the visualized veins of the bilateral lower extremities.   Myreon Wimer, RVT 09/14/2016, 5:57 PM

## 2016-09-14 NOTE — Progress Notes (Signed)
Physical Therapy Treatment Patient Details Name: Ann Sellers MRN: [id number] DOB: [birth year] Today's Date: 09/14/2016    History of Present Illness 82 y.o. female admitted to [facility] on 09/12/16 for LE weakness and progressive difficulty walking.  Pt dx with c-spine stenosis awaiting nerosurgery consult, possible new onset A-fib (cardiology following), acute on chronic diastolic CHF, and meningioma (neuro to f/u as it has changed in size), and anemia.  Pt with other significant PMHx R RTC tear s/p shoulder arthroscopy, CKD, DM, herniated disc, foot neuroma surgery, and L TKA.    PT Comments    Patient is progressing toward mobility goals. Continue to progress as tolerated with anticipated d/c home with HHPT.   Follow Up Recommendations  Home health PT;Supervision/Assistance - 24 hour (24/7 assist the first few days after d/c, HHOT, aide)     Equipment Recommendations  None recommended by PT    Recommendations for Other Services       Precautions / Restrictions Precautions Precautions: Fall Precaution Comments: pt admitts to falling at home    Mobility  Bed Mobility               General bed mobility comments: not assessed  Transfers Overall transfer level: Needs assistance Equipment used: Rolling walker (2 wheeled) Transfers: Sit to/from Stand Sit to Stand: Min guard         General transfer comment: cues for safe hand placement  Ambulation/Gait Ambulation/Gait assistance: Min guard Ambulation Distance (Feet): 200 Feet Assistive device: Rolling walker (2 wheeled) Gait Pattern/deviations: Step-through pattern;Decreased stride length Gait velocity: decreased   General Gait Details: slow, steady gait; cues for posture and proximity of RW; pt with R ankle externally rotated while ambulating and reported it makes her R LE feel more stable; when given cues pt can ambulate with R ankle in more neutral position but reported her knee feels lke it will  buckle   Stairs            Wheelchair Mobility    Modified Rankin (Stroke Patients Only)       Balance     Sitting balance-Leahy Scale: Good       Standing balance-Leahy Scale: Poor                      Cognition Arousal/Alertness: Awake/alert Behavior During Therapy: WFL for tasks assessed/performed Overall Cognitive Status: Within Functional Limits for tasks assessed                      Exercises General Exercises - Lower Extremity Long Arc Quad: AROM;Both;20 reps;Seated Hip Flexion/Marching: AROM;Both;20 reps;Seated    General Comments General comments (skin integrity, edema, etc.): pt on RA throughout session and SpO2 remained between 90-93%; O2 donned end of session      Pertinent Vitals/Pain Pain Assessment: No/denies pain    Home Living                      Prior Function            PT Goals (current goals can now be found in the care plan section) Acute Rehab PT Goals Patient Stated Goal: to remain independent PT Goal Formulation: With patient Time For Goal Achievement: 09/27/16 Potential to Achieve Goals: Good Progress towards PT goals: Progressing toward goals    Frequency    Min 3X/week      PT Plan Current plan remains appropriate    Co-evaluation  End of Session Equipment Utilized During Treatment: Gait belt Activity Tolerance: Patient tolerated treatment well Patient left: with call bell/phone within reach;with family/visitor present;in chair     Time: 4314-2767 PT Time Calculation (min) (ACUTE ONLY): 37 min  Charges:  $Gait Training: 23-37 mins                    G Codes:      Salina April, PTA Pager: [phone]   09/14/2016, 4:38 PM

## 2016-09-14 NOTE — Progress Notes (Signed)
Scheduled nebs not given, RN cleaning patient up. PRN treatment will be given if needed

## 2016-09-15 DIAGNOSIS — R29898 Other symptoms and signs involving the musculoskeletal system: Secondary | ICD-10-CM

## 2016-09-15 LAB — BASIC METABOLIC PANEL
ANION GAP: 13 (ref 5–15)
BUN: 55 mg/dL — ABNORMAL HIGH (ref 6–20)
CALCIUM: 8.2 mg/dL — AB (ref 8.9–10.3)
CO2: 24 mmol/L (ref 22–32)
Chloride: 102 mmol/L (ref 101–111)
Creatinine, Ser: 2.23 mg/dL — ABNORMAL HIGH (ref 0.44–1.00)
GFR, EST AFRICAN AMERICAN: 21 mL/min — AB (ref >60)
GFR, EST NON AFRICAN AMERICAN: 18 mL/min — AB (ref >60)
Glucose, Bld: 209 mg/dL — ABNORMAL HIGH (ref 65–99)
Potassium: 5.6 mmol/L — ABNORMAL HIGH (ref 3.5–5.1)
SODIUM: 139 mmol/L (ref 135–145)

## 2016-09-15 LAB — CBC
HCT: 30.2 % — ABNORMAL LOW (ref 36.0–46.0)
HEMOGLOBIN: 9.6 g/dL — AB (ref 12.0–15.0)
MCH: 30.1 pg (ref 26.0–34.0)
MCHC: 31.8 g/dL (ref 30.0–36.0)
MCV: 94.7 fL (ref 78.0–100.0)
PLATELETS: 232 10*3/uL (ref 150–400)
RBC: 3.19 MIL/uL — AB (ref 3.87–5.11)
RDW: 15.6 % — ABNORMAL HIGH (ref 11.5–15.5)
WBC: 10.2 10*3/uL (ref 4.0–10.5)

## 2016-09-15 LAB — GLUCOSE, CAPILLARY
GLUCOSE-CAPILLARY: 152 mg/dL — AB (ref 65–99)
GLUCOSE-CAPILLARY: 203 mg/dL — AB (ref 65–99)
GLUCOSE-CAPILLARY: 185 mg/dL — AB (ref 65–99)
GLUCOSE-CAPILLARY: 163 mg/dL — AB (ref 65–99)

## 2016-09-15 LAB — PROCALCITONIN: PROCALCITONIN: 0.14 ng/mL

## 2016-09-15 MED ORDER — ASPIRIN EC 81 MG PO TBEC
81.0000 mg | DELAYED_RELEASE_TABLET | Freq: Every day | ORAL | Status: DC
  Administered 2016-09-15 – 2016-09-17 (×3): 81 mg via ORAL
  Filled 2016-09-15 (×3): qty 1

## 2016-09-15 MED ORDER — DOXYCYCLINE HYCLATE 100 MG PO TABS
100.0000 mg | ORAL_TABLET | Freq: Two times a day (BID) | ORAL | Status: DC
  Administered 2016-09-15 – 2016-09-17 (×4): 100 mg via ORAL
  Filled 2016-09-15 (×4): qty 1

## 2016-09-15 NOTE — Progress Notes (Signed)
Patient Name: Ann Sellers Date of Encounter: 09/15/2016  Primary Cardiologist: Dr. Jessie Foot Problem List     Principal Problem:   Lower extremity weakness Active Problems:   Hypertension   Insulin dependent diabetes mellitus with complications [facility])   Spinal stenosis   TIA (transient ischemic attack)   CKD (chronic kidney disease), stage III   Edema   Morbid obesity ([place])   Community acquired pneumonia   Acute on chronic diastolic CHF (congestive heart failure) (HCC)   Acute on chronic congestive heart failure (HCC)   Atrial fibrillation, new onset (HCC)    Subjective   No palpitations or chest pain.  Inpatient Medications    Scheduled Meds: . allopurinol  100 mg Oral Daily  . budesonide (PULMICORT) nebulizer solution  0.25 mg Nebulization BID  . calcitRIOL  0.25 mcg Oral Daily  . doxycycline (VIBRAMYCIN) IV  100 mg Intravenous Q12H  . enoxaparin (LOVENOX) injection  30 mg Subcutaneous Daily  . fluticasone  2 spray Each Nare Daily  . gabapentin  300 mg Oral TID  . insulin aspart  0-9 Units Subcutaneous TID WC  . insulin aspart  3 Units Subcutaneous TID WC  . insulin glargine  25 Units Subcutaneous QHS  . ipratropium-albuterol  3 mL Nebulization TID  . linagliptin  5 mg Oral Daily  . loratadine  10 mg Oral Daily  . methylPREDNISolone (SOLU-MEDROL) injection  60 mg Intravenous Q12H  . metoprolol tartrate  12.5 mg Oral BID  . mometasone-formoterol  2 puff Inhalation BID  . montelukast  10 mg Oral QHS  . multivitamin  1 tablet Oral Daily  . pantoprazole  40 mg Oral Daily  . sodium chloride flush  3 mL Intravenous Q12H    PRN Meds: acetaminophen **OR** acetaminophen, HYDROcodone-acetaminophen, levalbuterol, metolazone, ondansetron **OR** ondansetron (ZOFRAN) IV, tiZANidine   Vital Signs    Vitals:   09/14/16 2135 09/14/16 2217 09/15/16 0620 09/15/16 0826  BP: (!) 150/54 133/62 (!) 136/57   Pulse: 87 82 72   Resp: 17 18 18    Temp: 98.2  F (36.8 C) 98.5 F (36.9 C) 97.8 F (36.6 C)   TempSrc: Oral Oral Oral   SpO2: 96% 100% 97% 95%  Weight:   198 lb 6.6 oz (90 kg)   Height:        Intake/Output Summary (Last 24 hours) at 09/15/16 1054 Last data filed at 09/15/16 0940  Gross per 24 hour  Intake              180 ml  Output                1 ml  Net              179 ml   Filed Weights   09/13/16 0300 09/14/16 0501 09/15/16 0620  Weight: 201 lb 11.2 oz (91.5 kg) 198 lb 8 oz (90 kg) 198 lb 6.6 oz (90 kg)    Physical Exam    Gen.: Elderly woman, no distress. HEENT: Conjunctiva and lids normal, oropharynx clear. Neck: Supple, no elevated JVP or carotid bruits, no thyromegaly. Lungs: Clear to auscultation, nonlabored breathing at rest. Cardiac: Regular rate and rhythm, no S3, 2/6 systolic murmur, no pericardial rub. Abdomen: Soft, nontender, bowel sounds present, no guarding or rebound. Extremities: Mild leg edema, distal pulses 2+.  Labs    CBC  Recent Labs  09/12/16 2302  09/13/16 0503 09/15/16 0631  WBC 9.2  --  8.3 10.2  NEUTROABS 6.8  --  6.8  --   HGB 10.8*  < > 9.7* 9.6*  HCT 34.3*  < > 30.9* 30.2*  MCV 94.8  --  94.5 94.7  PLT 233  --  236 232  < > = values in this interval not displayed. Basic Metabolic Panel  Recent Labs  09/12/16 2304  09/13/16 0503 09/15/16 0631  NA  --   < > 142 139  K  --   < > 4.6 5.6*  CL  --   < > 108 102  CO2  --   --  22 24  GLUCOSE  --   < > 169* 209*  BUN  --   < > 24* 55*  CREATININE  --   < > 1.69* 2.23*  CALCIUM  --   --  8.8* 8.2*  MG 1.6*  --   --   --   < > = values in this interval not displayed. Liver Function Tests  Recent Labs  09/12/16 2302 09/13/16 0503  AST 18 16  ALT 13* 13*  ALKPHOS 85 68  BILITOT 0.5 0.3  PROT 6.6 6.0*  ALBUMIN 4.0 3.5   Cardiac Enzymes  Recent Labs  09/13/16 0503 09/13/16 1003 09/13/16 1631  TROPONINI 0.17* 0.24* 0.14*   Thyroid Function Tests  Recent Labs  09/13/16 0503  TSH 2.062     Telemetry    I personally reviewed telemetry monitoring which shows normal sinus rhythm.  Cardiac Studies   Echocardiogram 09/14/2016: Study Conclusions  - Left ventricle: The cavity size was normal. Wall thickness was   increased in a pattern of mild LVH. Systolic function was   vigorous. The estimated ejection fraction was in the range of 65%   to 70%. Wall motion was normal; there were no regional wall   motion abnormalities. Doppler parameters are consistent with   abnormal left ventricular relaxation (grade 1 diastolic   dysfunction). - Aortic valve: Moderately calcified leaflets. Left coronary cusp   mobility was severely restricted. There was moderate stenosis.   There was trivial regurgitation. Mean gradient (S): 23 mm Hg.   Peak gradient (S): 43 mm Hg. VTI ratio of LVOT to aortic valve:   0.42. Valve area (VTI): 0.94 cm^2. Valve area by planimetry 1.4   cm^2. - Mitral valve: Calcified annulus. There was mild regurgitation. - Left atrium: The atrium was mildly dilated. - Right atrium: Central venous pressure (est): 8 mm Hg. - Tricuspid valve: There was mild regurgitation. - Pulmonary arteries: PA peak pressure: 37 mm Hg (S). - Pericardium, extracardiac: There was no pericardial effusion.  Impressions:  - Mild LVH with LVEF 65-70% and grade 1 diastolic dysfunction. Mild   left atrial enlargement. Calcified mitral annulus with mild   mitral regurgitation. Moderate calcific aortic stenosis with   trivial aortic regurgitation. Mild tricuspid regurgitation with   PASP 37 mmHg.  Patient Profile     82 year old woman with history of CKD stage 3, type 2 diabetes mellitus, chronic leg edema, now admitted with leg weakness, shortness of breath, and possible pneumonia. Head CT and MRI did not reveal acute stroke. She does have cervical spine stenosis. She was noted have transient rapid atrial fibrillation which spontaneously resolved. She was seen in consultation by Dr.  Meda Coffee on November 24. Currently on IV Lasix with follow-up echocardiogram showing preserved LVEF and moderate aortic stenosis.  Assessment & Plan    1. Transient atrial fibrillation with RVR, maintaining sinus rhythm having spontaneously converted. She  is currently on Lopressor. CHADSVASC score is 6. Decision to hold off on anticoagulation at this point in light of anemia as well as history of nose bleeds and multiple cauterizations. If she has recurring episodes this will need to be reconsidered.  2. Essential hypertension, systolic blood pressure 158E.  3. Acute on chronic diastolic heart failure, currently on IV Lasix.  4. Moderate aortic valve stenosis by follow-up echocardiogram.  5. Acute on chronic renal insufficiency with CKD stage 3 at baseline.  Start aspirin. Continue low-dose Lopressor. Would hold Lasix for now with worsening renal function.  Signed, Satira Sark, M.D., F.A.C.C.  09/15/2016, 10:54 AM

## 2016-09-15 NOTE — Evaluation (Signed)
Occupational Therapy Evaluation and Discharge Patient Details Name: Ann Sellers MRN: [id number] DOB: July 27, 1925 Today's Date: 09/15/2016    History of Present Illness 82 y.o. female admitted to [facility] on 09/12/16 for LE weakness and progressive difficulty walking.  Pt dx with c-spine stenosis awaiting nerosurgery consult, possible new onset A-fib (cardiology following), acute on chronic diastolic CHF, and meningioma (neuro to f/u as it has changed in size), and anemia.  Pt with other significant PMHx R RTC tear s/p shoulder arthroscopy, CKD, DM, herniated disc, foot neuroma surgery, and L TKA.   Clinical Impression   This 82 yo female admitted with above presents to acute OT with deficits below (see OT problem list). Per chart pt can have someone with her at least initially as much as she needs them (son) so HHOT can address further OT needs. We will sign off.    Follow Up Recommendations  Home health OT;Other (comment) (24 hour S/prn A first few days then intermittent S)    Equipment Recommendations  Other (comment) (RW (pt's current one is really old and rickety per pt))       Precautions / Restrictions Precautions Precautions: Fall Precaution Comments: pt admitts to falling at home Restrictions Weight Bearing Restrictions: No      Mobility Bed Mobility               General bed mobility comments: Pt up in recliner upon arrival  Transfers Overall transfer level: Needs assistance Equipment used: Rolling walker (2 wheeled) Transfers: Sit to/from Stand Sit to Stand: Supervision         General transfer comment: S in room to ambulate around with RW    Balance Overall balance assessment: Needs assistance Sitting-balance support: No upper extremity supported;Feet supported Sitting balance-Leahy Scale: Good     Standing balance support: Bilateral upper extremity supported Standing balance-Leahy Scale: Poor                              ADL Overall  ADL's : Needs assistance/impaired Eating/Feeding: Independent;Sitting   Grooming: Supervision/safety;Standing   Upper Body Bathing: Supervision/ safety;Sitting;Standing   Lower Body Bathing: Supervison/ safety;Sit to/from stand   Upper Body Dressing : Supervision/safety;Sitting   Lower Body Dressing: Supervision/safety;Sit to/from stand Lower Body Dressing Details (indicate cue type and reason): uses AE at home pta Toilet Transfer: Supervision/safety;Ambulation;RW   Toileting- Clothing Manipulation and Hygiene: Moderate assistance Toileting - Clothing Manipulation Details (indicate cue type and reason): S sit to stand; needs A her due to she has a toilet aide at home. I educated her on perhaps considering a bidet.   Tub/Shower Transfer Details (indicate cue type and reason): Pt does not want a seat for her walk in shower, she just wants to use her grab bars                   Pertinent Vitals/Pain Pain Assessment: No/denies pain     Hand Dominance Right   Extremity/Trunk Assessment Upper Extremity Assessment Upper Extremity Assessment: RUE deficits/detail;LUE deficits/detail RUE Deficits / Details: Decreased internal and external rotation LUE Deficits / Details: Decreased internal and external rotation           Communication Communication Communication: HOH (has hearing aids)   Cognition Arousal/Alertness: Awake/alert Behavior During Therapy: WFL for tasks assessed/performed Overall Cognitive Status: Within Functional Limits for tasks assessed  Home Living Family/patient expects to be discharged to:: Private residence Living Arrangements: Children (son) Available Help at Discharge: Family;Available PRN/intermittently (son works, but per PT note son can take a few days off) Type of Home: House Home Access: Level entry     Home Layout: Two level;Bed/bath upstairs Alternate Level Stairs-Number of Steps: 3 Alternate  Level Stairs-Rails: None (pt pulls on molding to pull herself up) Bathroom Shower/Tub: Occupational psychologist: Standard     Home Equipment: Environmental consultant - 4 wheels;Walker - 2 wheels;Grab bars - toilet;Grab bars - tub/shower;Toilet riser          Prior Functioning/Environment Level of Independence: Independent;Independent with assistive device(s)        Comments: pt reports she normally furniture walks, uses RW when outside of the home, does her own bathing and dressing (uses stand up shower, doesn't want a shower chair because that means she has to get up from sitting and this is difficult for her).  She reports she sleeps in a recliner chair because it is easier to get out of than her bed and it is difficult for her to sleep lying down (breathing). Uses a sock aid for socks at home (hers broke, so I informed her where she could get one)        OT Problem List: Obesity;Impaired balance (sitting and/or standing)      OT Goals(Current goals can be found in the care plan section) Acute Rehab OT Goals Patient Stated Goal: to remain independent and have everyone over for Christmas  OT Frequency: Min 2X/week   Barriers to D/C: Decreased caregiver support             End of Session Equipment Utilized During Treatment: Rolling walker Nurse Communication:  (battery on telemetry box needed changing (red battery light))  Activity Tolerance: Patient tolerated treatment well Patient left: in chair;with call bell/phone within reach   Time: 1216-1236 OT Time Calculation (min): 20 min Charges:  OT General Charges $OT Visit: 1 Procedure OT Evaluation $OT Eval Moderate Complexity: 1 Procedure  Almon Register [phone] 09/15/2016, 1:08 PM

## 2016-09-15 NOTE — Progress Notes (Signed)
PROGRESS NOTE  Ann Sellers WCB:[id number] DOB: July 28, 1925 DOA: 09/12/2016 PCP: Eloise Levels, NP   LOS: 2 days   Brief Narrative: In brief, this is a 82 year old female with history of diastolic CHF, diabetes, CKD, admitted to the hospital with bilateral lower extremity weakness left more than the right, and concern for stroke. She was initially brought in as a code stroke, neurology was consulted and code stroke was canceled. MRI of the brain was negative.  Assessment & Plan: Principal Problem:   Lower extremity weakness Active Problems:   Hypertension   Insulin dependent diabetes mellitus with complications (HCC)   Spinal stenosis   TIA (transient ischemic attack)   CKD (chronic kidney disease), stage III   Edema   Morbid obesity ([place])   Community acquired pneumonia   Acute on chronic diastolic CHF (congestive heart failure) (HCC)   Acute on chronic congestive heart failure (HCC)   Atrial fibrillation, new onset (HCC)   Bilateral lower extremity weakness - MRI of the C-spine shows severe canal stenosis with cord flattening. Patient has a history of anterior cervical fusion of C4 through C6 done by Dr. Joya Salm, discussed patient's care with Dr. Saintclair Halsted for neurosurgery, images reviewed, does not feel that there is anything acute from neurosurgical standpoint can be done now, and recommends physical therapy as mainstay of treatment and outpatient follow-up with Dr. Joya Salm - PT/OT recommending HHPT / 24h supervision   Possible community-acquired pneumonia - Basilar chest x-ray, patient also reports increased cough over the last few days. She was started on doxycycline, continue  Acute hypoxic respiratory faiulre - multifactorial, dCHF, asthma - no on oxygen at home, wean off as tolerated  Acute on chronic diastolic heart failure - Patient with evidence of fluid overload on chest x-ray as well as bilateral lower extremity edema, status post IV Lasix in the ED, continue diuresis.  Strict I's and O's. Cardiology following - weight 209 on admission, 198 today. Hold Lasix due to Cr elevation   A. fib with RVR - Apparently new onset, score greater than 2, cardiology consulted - Troponin slightly elevated 0.17 >> 0.24, patient without any chest pain, likely demand - Hold off anticoagulation given her anemia as prior nosebleeds status post multiple cauterizations,  IDDM - Continue Lantus 20 units as well as sliding scale - Poorly controlled CBGs, likely due to steroids, increased Lantus to 25 units and add NovoLog scheduled yesterday, CBGs improved today, stay on same regimen   Severe persistent chronic asthma with possible exacerbation - Patient with increased wheezing on exam, however this can be multifactorial in the setting of acute on chronic diastolic heart failure, cannot exclude acute asthma exacerbation, continue antibiotics as above, place patient on steroids - Wheezing improved but still some wheezing today, continue IV steroids   Chronic kidney disease stage III - creatinine worsening today due to Lasix, hold diuresis today and repeat renal function in am    DVT prophylaxis: Lovenox Code Status: Full Family Communication: no family at bedside Disposition Plan: home 1-2 days pending renal function and respiratory status  Consultants:   Cardiology   Procedures:   2D echo Impressions: - Mild LVH with LVEF 65-70% and grade 1 diastolic dysfunction. Mild left atrial enlargement. Calcified mitral annulus with mild mitral regurgitation. Moderate calcific aortic stenosis with trivial aortic regurgitation. Mild tricuspid regurgitation with PASP 37 mmHg.  Antimicrobials:  Doxycycline 11/24 >>   Subjective: - no chest pain, shortness of breath, no abdominal pain, nausea or vomiting.  - feels a  bit better today but weak  Objective: Vitals:   09/14/16 2135 09/14/16 2217 09/15/16 0620 09/15/16 0826  BP: (!) 150/54 133/62 (!) 136/57   Pulse: 87 82 72     Resp: 17 18 18    Temp: 98.2 F (36.8 C) 98.5 F (36.9 C) 97.8 F (36.6 C)   TempSrc: Oral Oral Oral   SpO2: 96% 100% 97% 95%  Weight:   90 kg (198 lb 6.6 oz)   Height:        Intake/Output Summary (Last 24 hours) at 09/15/16 1442 Last data filed at 09/15/16 0940  Gross per 24 hour  Intake              180 ml  Output                1 ml  Net              179 ml   Filed Weights   09/13/16 0300 09/14/16 0501 09/15/16 0620  Weight: 91.5 kg (201 lb 11.2 oz) 90 kg (198 lb 8 oz) 90 kg (198 lb 6.6 oz)    Examination: Constitutional: NAD Vitals:   09/14/16 2135 09/14/16 2217 09/15/16 0620 09/15/16 0826  BP: (!) 150/54 133/62 (!) 136/57   Pulse: 87 82 72   Resp: 17 18 18    Temp: 98.2 F (36.8 C) 98.5 F (36.9 C) 97.8 F (36.6 C)   TempSrc: Oral Oral Oral   SpO2: 96% 100% 97% 95%  Weight:   90 kg (198 lb 6.6 oz)   Height:       Eyes: PERRL, lids and conjunctivae normal Respiratory: clear to auscultation bilaterally, scant  wheezing, no crackles. Normal respiratory effort. Overall decreased breath sounds Cardiovascular: Regular rate and rhythm, no murmurs / rubs / gallops. No LE edema. 2+ pedal pulses.  Abdomen: no tenderness. Bowel sounds positive.  Skin: bilateral shin erythematous rash improved Neurologic: non focal    Data Reviewed: I have personally reviewed following labs and imaging studies  CBC:  Recent Labs Lab 09/12/16 2302 09/12/16 2310 09/13/16 0503 09/15/16 0631  WBC 9.2  --  8.3 10.2  NEUTROABS 6.8  --  6.8  --   HGB 10.8* 11.6* 9.7* 9.6*  HCT 34.3* 34.0* 30.9* 30.2*  MCV 94.8  --  94.5 94.7  PLT 233  --  236 638   Basic Metabolic Panel:  Recent Labs Lab 09/12/16 2302 09/12/16 2304 09/12/16 2310 09/13/16 0503 09/15/16 0631  NA 140  --  142 142 139  K 4.7  --  4.9 4.6 5.6*  CL 107  --  107 108 102  CO2 22  --   --  22 24  GLUCOSE 177*  --  169* 169* 209*  BUN 24*  --  30* 24* 55*  CREATININE 1.67*  --  1.60* 1.69* 2.23*  CALCIUM 9.0   --   --  8.8* 8.2*  MG  --  1.6*  --   --   --    GFR: Estimated Creatinine Clearance: 16.6 mL/min (by C-G formula based on SCr of 2.23 mg/dL (H)). Liver Function Tests:  Recent Labs Lab 09/12/16 2302 09/13/16 0503  AST 18 16  ALT 13* 13*  ALKPHOS 85 68  BILITOT 0.5 0.3  PROT 6.6 6.0*  ALBUMIN 4.0 3.5   No results for input(s): LIPASE, AMYLASE in the last 168 hours. No results for input(s): AMMONIA in the last 168 hours. Coagulation Profile:  Recent Labs Lab 09/12/16 2302  INR 0.93   Cardiac Enzymes:  Recent Labs Lab 09/13/16 0503 09/13/16 1003 09/13/16 1631  TROPONINI 0.17* 0.24* 0.14*   BNP (last 3 results) No results for input(s): PROBNP in the last 8760 hours. HbA1C: No results for input(s): HGBA1C in the last 72 hours. CBG:  Recent Labs Lab 09/14/16 1208 09/14/16 1644 09/14/16 2223 09/15/16 0836 09/15/16 1205  GLUCAP 295* 245* 174* 152* 203*   Lipid Profile: No results for input(s): CHOL, HDL, LDLCALC, TRIG, CHOLHDL, LDLDIRECT in the last 72 hours. Thyroid Function Tests:  Recent Labs  09/13/16 0503  TSH 2.062   Anemia Panel: No results for input(s): VITAMINB12, FOLATE, FERRITIN, TIBC, IRON, RETICCTPCT in the last 72 hours. Urine analysis:    Component Value Date/Time   COLORURINE YELLOW 02/01/2014 1732   APPEARANCEUR CLEAR 02/01/2014 1732   LABSPEC 1.014 02/01/2014 1732   PHURINE 5.0 02/01/2014 1732   GLUCOSEU NEGATIVE 02/01/2014 1732   HGBUR NEGATIVE 02/01/2014 1732   BILIRUBINUR NEGATIVE 02/01/2014 1732   KETONESUR NEGATIVE 02/01/2014 1732   PROTEINUR NEGATIVE 02/01/2014 1732   UROBILINOGEN 0.2 02/01/2014 1732   NITRITE NEGATIVE 02/01/2014 1732   LEUKOCYTESUR NEGATIVE 02/01/2014 1732   Sepsis Labs: Invalid input(s): PROCALCITONIN, LACTICIDVEN  Recent Results (from the past 240 hour(s))  Culture, blood (Routine X 2) w Reflex to ID Panel     Status: None (Preliminary result)   Collection Time: 09/13/16 12:04 AM  Result Value  Ref Range Status   Specimen Description BLOOD RIGHT ARM  Final   Special Requests BOTTLES DRAWN AEROBIC AND ANAEROBIC 5CC  Final   Culture NO GROWTH 2 DAYS  Final   Report Status PENDING  Incomplete  Culture, blood (Routine X 2) w Reflex to ID Panel     Status: None (Preliminary result)   Collection Time: 09/13/16 12:08 AM  Result Value Ref Range Status   Specimen Description BLOOD WRIST RIGHT  Final   Special Requests BOTTLES DRAWN AEROBIC AND ANAEROBIC 5CC  Final   Culture NO GROWTH 2 DAYS  Final   Report Status PENDING  Incomplete      Radiology Studies: No results found.   Scheduled Meds: . allopurinol  100 mg Oral Daily  . aspirin EC  81 mg Oral Daily  . budesonide (PULMICORT) nebulizer solution  0.25 mg Nebulization BID  . calcitRIOL  0.25 mcg Oral Daily  . doxycycline  100 mg Oral Q12H  . enoxaparin (LOVENOX) injection  30 mg Subcutaneous Daily  . fluticasone  2 spray Each Nare Daily  . gabapentin  300 mg Oral TID  . insulin aspart  0-9 Units Subcutaneous TID WC  . insulin aspart  3 Units Subcutaneous TID WC  . insulin glargine  25 Units Subcutaneous QHS  . ipratropium-albuterol  3 mL Nebulization TID  . linagliptin  5 mg Oral Daily  . loratadine  10 mg Oral Daily  . methylPREDNISolone (SOLU-MEDROL) injection  60 mg Intravenous Q12H  . metoprolol tartrate  12.5 mg Oral BID  . mometasone-formoterol  2 puff Inhalation BID  . montelukast  10 mg Oral QHS  . multivitamin  1 tablet Oral Daily  . pantoprazole  40 mg Oral Daily  . sodium chloride flush  3 mL Intravenous Q12H   Continuous Infusions:   Marzetta Board, MD, PhD [facility] Pager [phone]  If 7PM-7AM, please contact night-coverage [email or web address] Password [email or web address] 09/15/2016, 2:42 PM

## 2016-09-15 NOTE — Progress Notes (Signed)
SATURATION QUALIFICATIONS: (This note is used to comply with regulatory documentation for home oxygen)  Patient Saturations on Room Air at Rest = 98%  Patient Saturations on Room Air while Ambulating = 94%  Patient Saturations on 2 Liters of oxygen while Ambulating = 98%  Please briefly explain why patient needs home oxygen: 

## 2016-09-16 DIAGNOSIS — I350 Nonrheumatic aortic (valve) stenosis: Secondary | ICD-10-CM

## 2016-09-16 LAB — CBC
HEMATOCRIT: 29.2 % — AB (ref 36.0–46.0)
Hemoglobin: 9.5 g/dL — ABNORMAL LOW (ref 12.0–15.0)
MCH: 30.1 pg (ref 26.0–34.0)
MCHC: 32.5 g/dL (ref 30.0–36.0)
MCV: 92.4 fL (ref 78.0–100.0)
PLATELETS: 238 10*3/uL (ref 150–400)
RBC: 3.16 MIL/uL — ABNORMAL LOW (ref 3.87–5.11)
RDW: 15.4 % (ref 11.5–15.5)
WBC: 8.6 10*3/uL (ref 4.0–10.5)

## 2016-09-16 LAB — BASIC METABOLIC PANEL
Anion gap: 12 (ref 5–15)
BUN: 71 mg/dL — AB (ref 6–20)
CHLORIDE: 100 mmol/L — AB (ref 101–111)
CO2: 27 mmol/L (ref 22–32)
CREATININE: 2.4 mg/dL — AB (ref 0.44–1.00)
Calcium: 7.9 mg/dL — ABNORMAL LOW (ref 8.9–10.3)
GFR calc Af Amer: 19 mL/min — ABNORMAL LOW (ref >60)
GFR calc non Af Amer: 17 mL/min — ABNORMAL LOW (ref >60)
GLUCOSE: 206 mg/dL — AB (ref 65–99)
POTASSIUM: 5.2 mmol/L — AB (ref 3.5–5.1)
SODIUM: 139 mmol/L (ref 135–145)

## 2016-09-16 LAB — GLUCOSE, CAPILLARY
Glucose-Capillary: 158 mg/dL — ABNORMAL HIGH (ref 65–99)
Glucose-Capillary: 264 mg/dL — ABNORMAL HIGH (ref 65–99)
Glucose-Capillary: 182 mg/dL — ABNORMAL HIGH (ref 65–99)
Glucose-Capillary: 181 mg/dL — ABNORMAL HIGH (ref 65–99)

## 2016-09-16 MED ORDER — PREDNISONE 50 MG PO TABS
50.0000 mg | ORAL_TABLET | Freq: Every day | ORAL | Status: DC
  Administered 2016-09-17: 50 mg via ORAL
  Filled 2016-09-16: qty 1

## 2016-09-16 MED ORDER — GABAPENTIN 100 MG PO CAPS
200.0000 mg | ORAL_CAPSULE | Freq: Three times a day (TID) | ORAL | Status: DC
  Administered 2016-09-16 – 2016-09-17 (×3): 200 mg via ORAL
  Filled 2016-09-16 (×3): qty 2

## 2016-09-16 NOTE — Progress Notes (Signed)
PROGRESS NOTE    Ann Sellers  KWI:[id number] DOB: 05-31-1925 DOA: 09/12/2016 PCP: Eloise Levels, NP     Brief Narrative:  In brief, this is a 82 year old female with history of diastolic CHF, diabetes, CKD, admitted to the hospital with bilateral lower extremity weakness left more than the right, and concern for stroke. She was initially brought in as a code stroke, neurology was consulted and code stroke was canceled. MRI of the brain was negative.   Assessment & Plan:   Principal Problem:   Lower extremity weakness Active Problems:   Cough   Hypertension   Insulin dependent diabetes mellitus with complications (HCC)   Spinal stenosis   CKD (chronic kidney disease), stage III   Edema   Morbid obesity ([place])   Community acquired pneumonia   Acute on chronic diastolic CHF (congestive heart failure) (HCC)   Atrial fibrillation, new onset (HCC)   Aortic stenosis, moderate   Bilateral lower extremity weakness - MRI of the C-spine shows severe canal stenosis with cord flattening. Patient has a history of anterior cervical fusion of C4 through C6done by Dr. Joya Salm. Dr. Cruzita Lederer discussed patient's care with Dr. Saintclair Halsted for neurosurgery, images reviewed, does not feel that there is anything acute from neurosurgical standpoint can be done now, and recommends physical therapy as mainstay of treatment and outpatient follow-up with Dr. Joya Salm - PT/OT recommending HHPT / 24h supervision   Possible community-acquired pneumonia - Basilar chest x-ray, patient also reports increased cough over the last few days. She was started on doxycycline, continue  Acute hypoxic respiratory faiulre - Multifactorial, dCHF, asthma, CAP  - Now on room air   Acute on chronic diastolic heart failure - Patient with evidence of fluid overload on chest x-ray as well as bilateral lower extremity edema, status post IV Lasix in the ED, diuresis continued. Strict I's and O's. Cardiology following. Hold Lasix due  to Cr elevation   A. fib with RVR - Apparently new onset, CHADSVASC = 6  - Cardiology consulted - Troponin slightly elevated 0.17 >>0.24, patient without any chest pain, likely demand ischemia - Hold off anticoagulation given her anemia as prior nosebleeds status post multiple cauterizations. If she has recurring a fib episodes, this will need to be reconsidered   IDDM - Continue Lantus 20 units as well as sliding scale - Poorly controlled CBGs, likely due to steroids, increased Lantus to 25 units and add NovoLog scheduled yesterday, CBGs improved today, stay on same regimen  - Continue tradjenta   Severe persistent chronic asthma with possible exacerbation - Improved wheezing   AKI on Chronic kidney disease stage III - ?baseline Cr - Cr worsening in setting of lasix use  - Monitor    DVT prophylaxis: lovenox Code Status: full Family Communication: daughter at bedside Disposition Plan: pending further improvement, home with home PT    Consultants:   Neurology  Cardiology  Procedures:   2D echo Impressions: - Mild LVH with LVEF 65-70% and grade 1 diastolic dysfunction. Mildleft atrial enlargement. Calcified mitral annulus with mildmitral regurgitation. Moderate calcific aortic stenosis withtrivial aortic regurgitation. Mild tricuspid regurgitation withPASP 37 mmHg.  Antimicrobials:   Doxycycline 11/24 >>     Subjective: Patient doing well. She states that her lower extremity edema has almost normalized. Denies any chest pain or shortness of breath. Continues to have dry cough. No nausea or vomiting. No complaints. She worked with physical therapy today.  Objective: Vitals:   09/16/16 0443 09/16/16 0901 09/16/16 1047 09/16/16 1330  BP: Marland Kitchen)  107/46 (!) 163/63  (!) 156/57  Pulse: 76 74  73  Resp: 20   15  Temp: 97.9 F (36.6 C)   99.4 F (37.4 C)  TempSrc: Oral   Oral  SpO2: 96%  95% 95%  Weight:      Height:        Intake/Output Summary (Last 24  hours) at 09/16/16 1502 Last data filed at 09/15/16 2001  Gross per 24 hour  Intake               60 ml  Output              650 ml  Net             -590 ml   Filed Weights   09/13/16 0300 09/14/16 0501 09/15/16 0620  Weight: 91.5 kg (201 lb 11.2 oz) 90 kg (198 lb 8 oz) 90 kg (198 lb 6.6 oz)    Examination:  General exam: Appears calm and comfortable  Respiratory system: Clear to auscultation. Respiratory effort normal. Cardiovascular system: S1 & S2 heard, RRR. No JVD, murmurs, rubs, gallops or clicks. No pedal edema. Gastrointestinal system: Abdomen is nondistended, soft and nontender. No organomegaly or masses felt. Normal bowel sounds heard. Central nervous system: Alert and oriented. No focal neurological deficits. Extremities: Symmetric 5 x 5 power. Skin: No rashes, lesions or ulcers, redness and bruising along left lateral lower leg and some erythema along right shin. Non tender to palpation, trace edema bilaterally, pulses +2 Psychiatry: Judgement and insight appear normal. Mood & affect appropriate.   Data Reviewed: I have personally reviewed following labs and imaging studies  CBC:  Recent Labs Lab 09/12/16 2302 09/12/16 2310 09/13/16 0503 09/15/16 0631 09/16/16 0610  WBC 9.2  --  8.3 10.2 8.6  NEUTROABS 6.8  --  6.8  --   --   HGB 10.8* 11.6* 9.7* 9.6* 9.5*  HCT 34.3* 34.0* 30.9* 30.2* 29.2*  MCV 94.8  --  94.5 94.7 92.4  PLT 233  --  236 232 676   Basic Metabolic Panel:  Recent Labs Lab 09/12/16 2302 09/12/16 2304 09/12/16 2310 09/13/16 0503 09/15/16 0631 09/16/16 0610  NA 140  --  142 142 139 139  K 4.7  --  4.9 4.6 5.6* 5.2*  CL 107  --  107 108 102 100*  CO2 22  --   --  22 24 27   GLUCOSE 177*  --  169* 169* 209* 206*  BUN 24*  --  30* 24* 55* 71*  CREATININE 1.67*  --  1.60* 1.69* 2.23* 2.40*  CALCIUM 9.0  --   --  8.8* 8.2* 7.9*  MG  --  1.6*  --   --   --   --    GFR: Estimated Creatinine Clearance: 15.4 mL/min (by C-G formula based on  SCr of 2.4 mg/dL (H)). Liver Function Tests:  Recent Labs Lab 09/12/16 2302 09/13/16 0503  AST 18 16  ALT 13* 13*  ALKPHOS 85 68  BILITOT 0.5 0.3  PROT 6.6 6.0*  ALBUMIN 4.0 3.5   No results for input(s): LIPASE, AMYLASE in the last 168 hours. No results for input(s): AMMONIA in the last 168 hours. Coagulation Profile:  Recent Labs Lab 09/12/16 2302  INR 0.93   Cardiac Enzymes:  Recent Labs Lab 09/13/16 0503 09/13/16 1003 09/13/16 1631  TROPONINI 0.17* 0.24* 0.14*   BNP (last 3 results) No results for input(s): PROBNP in the last 8760 hours. HbA1C: No  results for input(s): HGBA1C in the last 72 hours. CBG:  Recent Labs Lab 09/15/16 1205 09/15/16 1738 09/15/16 2157 09/16/16 0804 09/16/16 1206  GLUCAP 203* 185* 163* 158* 264*   Lipid Profile: No results for input(s): CHOL, HDL, LDLCALC, TRIG, CHOLHDL, LDLDIRECT in the last 72 hours. Thyroid Function Tests: No results for input(s): TSH, T4TOTAL, FREET4, T3FREE, THYROIDAB in the last 72 hours. Anemia Panel: No results for input(s): VITAMINB12, FOLATE, FERRITIN, TIBC, IRON, RETICCTPCT in the last 72 hours. Sepsis Labs:  Recent Labs Lab 09/13/16 0503 09/15/16 0631  PROCALCITON <0.10 0.14    Recent Results (from the past 240 hour(s))  Culture, blood (Routine X 2) w Reflex to ID Panel     Status: None (Preliminary result)   Collection Time: 09/13/16 12:04 AM  Result Value Ref Range Status   Specimen Description BLOOD RIGHT ARM  Final   Special Requests BOTTLES DRAWN AEROBIC AND ANAEROBIC 5CC  Final   Culture NO GROWTH 3 DAYS  Final   Report Status PENDING  Incomplete  Culture, blood (Routine X 2) w Reflex to ID Panel     Status: None (Preliminary result)   Collection Time: 09/13/16 12:08 AM  Result Value Ref Range Status   Specimen Description BLOOD WRIST RIGHT  Final   Special Requests BOTTLES DRAWN AEROBIC AND ANAEROBIC 5CC  Final   Culture NO GROWTH 3 DAYS  Final   Report Status PENDING   Incomplete       Radiology Studies: No results found.    Scheduled Meds: . allopurinol  100 mg Oral Daily  . aspirin EC  81 mg Oral Daily  . budesonide (PULMICORT) nebulizer solution  0.25 mg Nebulization BID  . calcitRIOL  0.25 mcg Oral Daily  . doxycycline  100 mg Oral Q12H  . enoxaparin (LOVENOX) injection  30 mg Subcutaneous Daily  . fluticasone  2 spray Each Nare Daily  . gabapentin  200 mg Oral TID  . insulin aspart  0-9 Units Subcutaneous TID WC  . insulin aspart  3 Units Subcutaneous TID WC  . insulin glargine  25 Units Subcutaneous QHS  . ipratropium-albuterol  3 mL Nebulization TID  . linagliptin  5 mg Oral Daily  . loratadine  10 mg Oral Daily  . methylPREDNISolone (SOLU-MEDROL) injection  60 mg Intravenous Q12H  . metoprolol tartrate  12.5 mg Oral BID  . mometasone-formoterol  2 puff Inhalation BID  . montelukast  10 mg Oral QHS  . multivitamin  1 tablet Oral Daily  . pantoprazole  40 mg Oral Daily  . sodium chloride flush  3 mL Intravenous Q12H   Continuous Infusions:   LOS: 3 days    Time spent: 40 minutes   Dessa Phi, DO [facility] [email or web address] Password [email or web address] 09/16/2016, 3:02 PM

## 2016-09-16 NOTE — Progress Notes (Signed)
Patient Name: Ann Sellers Date of Encounter: 09/16/2016  Primary Cardiologist: Dr Irish Lack (pt saw him in 2014 as pre op clearance)  Hospital Problem List     Principal Problem:   Lower extremity weakness Active Problems:   Acute on chronic diastolic CHF (congestive heart failure) (HCC)   Atrial fibrillation, new onset (HCC)-spontaneous conversion to NSR   Aortic stenosis- moderate by echo 09/14/16   Cough-pt evaluated by Dr Melvyn Novas Feb 2017   Hypertension-HCVD-mild LVH grade 1 DD   Insulin dependent diabetes mellitus with complications ([place])   Spinal stenosis   CKD stage III- Dr Posey Pronto follows -? What her baseline is   Morbid obesity ([place])   Community acquired pneumonia    Subjective   Pt up in chair. Walked with PT this am. Denies increased SOB.   Inpatient Medications    Scheduled Meds: . allopurinol  100 mg Oral Daily  . aspirin EC  81 mg Oral Daily  . budesonide (PULMICORT) nebulizer solution  0.25 mg Nebulization BID  . calcitRIOL  0.25 mcg Oral Daily  . doxycycline  100 mg Oral Q12H  . enoxaparin (LOVENOX) injection  30 mg Subcutaneous Daily  . fluticasone  2 spray Each Nare Daily  . gabapentin  200 mg Oral TID  . insulin aspart  0-9 Units Subcutaneous TID WC  . insulin aspart  3 Units Subcutaneous TID WC  . insulin glargine  25 Units Subcutaneous QHS  . ipratropium-albuterol  3 mL Nebulization TID  . linagliptin  5 mg Oral Daily  . loratadine  10 mg Oral Daily  . methylPREDNISolone (SOLU-MEDROL) injection  60 mg Intravenous Q12H  . metoprolol tartrate  12.5 mg Oral BID  . mometasone-formoterol  2 puff Inhalation BID  . montelukast  10 mg Oral QHS  . multivitamin  1 tablet Oral Daily  . pantoprazole  40 mg Oral Daily  . sodium chloride flush  3 mL Intravenous Q12H   Continuous Infusions:  PRN Meds: acetaminophen **OR** acetaminophen, HYDROcodone-acetaminophen, levalbuterol, ondansetron **OR** ondansetron (ZOFRAN) IV, tiZANidine   Vital Signs      Vitals:   09/16/16 0443 09/16/16 0901 09/16/16 1047 09/16/16 1330  BP: (!) 107/46 (!) 163/63  (!) 156/57  Pulse: 76 74  73  Resp: 20   15  Temp: 97.9 F (36.6 C)   99.4 F (37.4 C)  TempSrc: Oral   Oral  SpO2: 96%  95% 95%  Weight:      Height:        Intake/Output Summary (Last 24 hours) at 09/16/16 1336 Last data filed at 09/15/16 2001  Gross per 24 hour  Intake               60 ml  Output              650 ml  Net             -590 ml   Filed Weights   09/13/16 0300 09/14/16 0501 09/15/16 0620  Weight: 201 lb 11.2 oz (91.5 kg) 198 lb 8 oz (90 kg) 198 lb 6.6 oz (90 kg)    Physical Exam    GEN: Obese Caucasian female in no acute distress.  HEENT: Grossly normal. Poor dentition Neck: Supple, no JVD, carotid bruits, or masses. Cardiac: RRR, 2/6 systolic murmur AOV, preserved S2  Respiratory:  Decreased breath sounds MS: no deformity or atrophy. Skin: warm and dry, no rash. Neuro:  Strength and sensation are intact. Psych: AAOx3.  Normal affect.  Labs    CBC  Recent Labs  09/15/16 0631 09/16/16 0610  WBC 10.2 8.6  HGB 9.6* 9.5*  HCT 30.2* 29.2*  MCV 94.7 92.4  PLT 232 616   Basic Metabolic Panel  Recent Labs  09/15/16 0631 09/16/16 0610  NA 139 139  K 5.6* 5.2*  CL 102 100*  CO2 24 27  GLUCOSE 209* 206*  BUN 55* 71*  CREATININE 2.23* 2.40*  CALCIUM 8.2* 7.9*    Recent Labs  09/13/16 1631  TROPONINI 0.14*     Telemetry    NSR, PACs, PVCs- Personally Reviewed  ECG    AF with RVR 09/13/16 - Personally Reviewed  Radiology    PCXR 09/12/16 COMPARISON:  05/17/ 2016  FINDINGS: Partially visualized cervical spine hardware. There is mild cardiomegaly with mild central vascular congestion. Mild airspace opacities are present in the right upper lobe and right greater than left lung bases, possible infiltrates. There is atherosclerosis of the aorta. No pneumothorax.  IMPRESSION: 1. Mild cardiomegaly with mild central congestion. 2.  Mild airspace opacities in the right upper lobe and right greater than left lung base could reflect infiltrates.   Cardiac Studies   Echo 09/14/16 Study Conclusions  - Left ventricle: The cavity size was normal. Wall thickness was   increased in a pattern of mild LVH. Systolic function was   vigorous. The estimated ejection fraction was in the range of 65%   to 70%. Wall motion was normal; there were no regional wall   motion abnormalities. Doppler parameters are consistent with   abnormal left ventricular relaxation (grade 1 diastolic   dysfunction). - Aortic valve: Moderately calcified leaflets. Left coronary cusp   mobility was severely restricted. There was moderate stenosis.   There was trivial regurgitation. Mean gradient (S): 23 mm Hg.   Peak gradient (S): 43 mm Hg. VTI ratio of LVOT to aortic valve:   0.42. Valve area (VTI): 0.94 cm^2. Valve area by planimetry 1.4   cm^2. - Mitral valve: Calcified annulus. There was mild regurgitation. - Left atrium: The atrium was mildly dilated. - Right atrium: Central venous pressure (est): 8 mm Hg. - Tricuspid valve: There was mild regurgitation. - Pulmonary arteries: PA peak pressure: 37 mm Hg (S). - Pericardium, extracardiac: There was no pericardial effusion.  Impressions:  - Mild LVH with LVEF 65-70% and grade 1 diastolic dysfunction. Mild   left atrial enlargement. Calcified mitral annulus with mild   mitral regurgitation. Moderate calcific aortic stenosis with   trivial aortic regurgitation. Mild tricuspid regurgitation with   PASP 37 mmHg.  Patient Profile     82 y/o obese female with a history of IDDM, CRI-3, asthma, chronic LE edema, and LE weakness, presented to the ED 09/12/16 with leg weakness, SOB, and transient Lt ptosis. CXR showed possible pneumonia. BNP was 100.  She was noted to be in AF with RVR-rate >150, new for her. A prior echo in April 2015 showed an EF of 60-65% with grade 1 DD. It was felt she had some  degree of CHF and IV Lasix was administered in ED. Neurology was consulted for possible code stroke. CT and MRI did not reveal stroke (she did have an incidental finding of a meningioma). MRI of cervical spine showed severe stenosis (she has had prior neck surgery 2005). Since admission the pt has spontaneously converted to NSR. Echo showed moderate AS with normal LVF. She diuresed 10 lbs but her SCr has gone from 1.6 to 2.4. She saw Dr Posey Pronto  earlier this month. She does not know what her baseline SCr is.   Assessment & Plan    She is holding NSR on low dose beta blocker (new for her). B/P is labile- Dr Melvyn Novas told her Losartan was not good for her cough and she stopped this. I'll contact Dr Serita Grit office- it could be her baseline SCr is 2.5-3.0. She is not anticoagulated secondary to a history of recurrent epistaxis and anemia, she is on low dose ASA.   Signed, Kerin Ransom, PA-C  09/16/2016, 1:36 PM   I have examined the patient and reviewed assessment and plan and discussed with patient.  Agree with above as stated.  Back in NSR.  Using aspirin only due to multiple nosebleeds and cauterizations.  Watch renal function.    Larae Grooms

## 2016-09-16 NOTE — Care Management Note (Signed)
Case Management Note  Patient Details  Name: Ann Sellers MRN: [id number] Date of Birth: 04/13/1925  Subjective/Objective:                 Patient admitted from home with lower extremity weakness. Holding Lasix, watching Cr. Patient states she plans to return home at DC and would like to [facility] PT provided through [facility]. Patient also states that she needs a rolling walker. CM placed order for walker. Anticipate DC tomorrow pending improvement in Cr.    Action/Plan:  DC to home with RW and HH PT through [facility]. Referrals to be made at DC.  Expected Discharge Date:                  Expected Discharge Plan:  [place]  In-House Referral:     Discharge planning Services  CM Consult  Post Acute Care Choice:  Durable Medical Equipment, Home Health Choice offered to:  Patient  DME Arranged:  Walker rolling DME Agency:  [place]:  PT [facility] Agency:  [place]  Status of Service:  In process, will continue to follow  If discussed at Long Length of Stay Meetings, dates discussed:    Additional Comments:  Carles Collet, RN 09/16/2016, 1:34 PM

## 2016-09-16 NOTE — Discharge Instructions (Signed)
Atrial Fibrillation Introduction Atrial fibrillation is a type of heartbeat that is irregular or fast (rapid). If you have this condition, your heart keeps quivering in a weird (chaotic) way. This condition can make it so your heart cannot pump blood normally. Having this condition gives a person more risk for stroke, heart failure, and other heart problems. There are different types of atrial fibrillation. Talk with your doctor to learn about the type that you have. Follow these instructions at home:  Take over-the-counter and prescription medicines only as told by your doctor.  If your doctor prescribed a blood-thinning medicine, take it exactly as told. Taking too much of it can cause bleeding. If you do not take enough of it, you will not have the protection that you need against stroke and other problems.  Do not use any tobacco products. These include cigarettes, chewing tobacco, and e-cigarettes. If you need help quitting, ask your doctor.  If you have apnea (obstructive sleep apnea), manage it as told by your doctor.  Do not drink alcohol.  Do not drink beverages that have caffeine. These include coffee, soda, and tea.  Maintain a healthy weight. Do not use diet pills unless your doctor says they are safe for you. Diet pills may make heart problems worse.  Follow diet instructions as told by your doctor.  Exercise regularly as told by your doctor.  Keep all follow-up visits as told by your doctor. This is important. Contact a doctor if:  You notice a change in the speed, rhythm, or strength of your heartbeat.  You are taking a blood-thinning medicine and you notice more bruising.  You get tired more easily when you move or exercise. Get help right away if:  You have pain in your chest or your belly (abdomen).  You have sweating or weakness.  You feel sick to your stomach (nauseous).  You notice blood in your throw up (vomit), poop (stool), or pee (urine).  You are  short of breath.  You suddenly have swollen feet and ankles.  You feel dizzy.  Your suddenly get weak or numb in your face, arms, or legs, especially if it happens on one side of your body.  You have trouble talking, trouble understanding, or both.  Your face or your eyelid droops on one side. These symptoms may be an emergency. Do not wait to see if the symptoms will go away. Get medical help right away. Call your local emergency services (911 in the [place] Do not drive yourself to the hospital.  This information is not intended to replace advice given to you by your health care provider. Make sure you discuss any questions you have with your health care provider. Document Released: 07/16/2008 Document Revised: 03/14/2016 Document Reviewed: 02/01/2015  2017 Elsevier

## 2016-09-16 NOTE — Progress Notes (Signed)
Physical Therapy Treatment Patient Details Name: Ann Sellers MRN: [id number] DOB: [birth year] Today's Date: 09/16/2016    History of Present Illness 82 y.o. female admitted to [facility] on 09/12/16 for LE weakness and progressive difficulty walking.  Pt dx with c-spine stenosis awaiting nerosurgery consult, possible new onset A-fib (cardiology following), acute on chronic diastolic CHF, and meningioma (neuro to f/u as it has changed in size), and anemia.  Pt with other significant PMHx R RTC tear s/p shoulder arthroscopy, CKD, DM, herniated disc, foot neuroma surgery, and L TKA.    PT Comments    The pt is moving well with therapy with no complaints of pain with gait.  Pt demonstrates decreased strength and endurance while ambulating, but had to defer strengthening exercises due to visitors arriving. Continue with POC while focusing on strengthening and initiate stair training next session.  Follow Up Recommendations  Home health PT;Supervision/Assistance - 24 hour     Equipment Recommendations  None recommended by PT    Recommendations for Other Services       Precautions / Restrictions Precautions Precautions: Fall Precaution Comments: pt admitts to falling at home Restrictions Weight Bearing Restrictions: No    Mobility  Bed Mobility               General bed mobility comments: Pt up in recliner upon arrival  Transfers Overall transfer level: Needs assistance Equipment used: Rolling walker (2 wheeled) Transfers: Sit to/from Stand Sit to Stand: Min guard;Supervision         General transfer comment: Min guard for safety to transfer to/from [facility] and chair.  Cues for technique and hand positioning.  Ambulation/Gait Ambulation/Gait assistance: Min guard Ambulation Distance (Feet): 200 Feet Assistive device: Rolling walker (2 wheeled) Gait Pattern/deviations: Step-through pattern;Decreased stride length Gait velocity: decreased Gait velocity interpretation: Below  normal speed for age/gender General Gait Details: slow, steady gait; cues for posture and proximity of RW; pt with R ankle externally rotated while ambulating and reported it makes her R LE feel more stable; pt stated that maybe it was pain in her R hip that is causing her R foot to externally rotate.   Stairs            Wheelchair Mobility    Modified Rankin (Stroke Patients Only)       Balance     Sitting balance-Leahy Scale: Good     Standing balance support: Bilateral upper extremity supported Standing balance-Leahy Scale: Fair Standing balance comment: Pt able to maintain standing balance with min assist and pull up her underwear.                    Cognition Arousal/Alertness: Awake/alert Behavior During Therapy: WFL for tasks assessed/performed Overall Cognitive Status: Within Functional Limits for tasks assessed                      Exercises      General Comments        Pertinent Vitals/Pain Pain Assessment: No/denies pain    Home Living                      Prior Function            PT Goals (current goals can now be found in the care plan section) Acute Rehab PT Goals Patient Stated Goal: to remain independent and have everyone over for Christmas PT Goal Formulation: With patient Time For Goal Achievement: 09/27/16 Potential to Achieve  Goals: Good Progress towards PT goals: Progressing toward goals    Frequency    Min 3X/week      PT Plan Current plan remains appropriate    Co-evaluation             End of Session Equipment Utilized During Treatment: Gait belt Activity Tolerance: Patient tolerated treatment well Patient left: in chair;with call bell/phone within reach;with family/visitor present     Time: 1227-1252 PT Time Calculation (min) (ACUTE ONLY): 25 min  Charges:  $Gait Training: 8-22 mins $Therapeutic Activity: 8-22 mins                    G Codes:      Bary Castilla 2016/09/17, 3:57  PM  Rito Ehrlich. [place], [place]

## 2016-09-17 LAB — BASIC METABOLIC PANEL
Anion gap: 11 (ref 5–15)
BUN: 72 mg/dL — AB (ref 6–20)
CO2: 27 mmol/L (ref 22–32)
CREATININE: 2.19 mg/dL — AB (ref 0.44–1.00)
Calcium: 7.6 mg/dL — ABNORMAL LOW (ref 8.9–10.3)
Chloride: 103 mmol/L (ref 101–111)
GFR calc Af Amer: 22 mL/min — ABNORMAL LOW (ref >60)
GFR, EST NON AFRICAN AMERICAN: 19 mL/min — AB (ref >60)
GLUCOSE: 79 mg/dL (ref 65–99)
Potassium: 4.7 mmol/L (ref 3.5–5.1)
SODIUM: 141 mmol/L (ref 135–145)

## 2016-09-17 LAB — GLUCOSE, CAPILLARY
GLUCOSE-CAPILLARY: 76 mg/dL (ref 65–99)
Glucose-Capillary: 140 mg/dL — ABNORMAL HIGH (ref 65–99)

## 2016-09-17 MED ORDER — FUROSEMIDE 20 MG PO TABS: 40.0000 mg | ORAL_TABLET | Freq: Every day | ORAL | 0 refills | Status: DC

## 2016-09-17 MED ORDER — PREDNISONE 10 MG PO TABS: ORAL_TABLET | ORAL | 0 refills | Status: DC

## 2016-09-17 MED ORDER — GABAPENTIN 100 MG PO CAPS
200.0000 mg | ORAL_CAPSULE | Freq: Three times a day (TID) | ORAL | 0 refills | Status: DC
Start: 2016-09-17 — End: 2017-08-04

## 2016-09-17 MED ORDER — ASPIRIN 81 MG PO TBEC: 81.0000 mg | DELAYED_RELEASE_TABLET | Freq: Every day | ORAL | 0 refills | Status: AC

## 2016-09-17 MED ORDER — METOPROLOL TARTRATE 25 MG PO TABS: 12.5000 mg | ORAL_TABLET | Freq: Two times a day (BID) | ORAL | 0 refills | Status: DC

## 2016-09-17 MED ORDER — DOXYCYCLINE HYCLATE 100 MG PO TABS
100.0000 mg | ORAL_TABLET | Freq: Two times a day (BID) | ORAL | 0 refills | Status: AC
Start: 2016-09-17 — End: 2016-09-18

## 2016-09-17 NOTE — Care Management Important Message (Signed)
Important Message  Patient Details  Name: Ann Sellers MRN: [id number] Date of Birth: 1925/08/24   Medicare Important Message Given:  Yes    [name] [name] 09/17/2016, 11:49 AM

## 2016-09-17 NOTE — Care Management Note (Signed)
Case Management Note  Patient Details  Name: LACI FRENKEL MRN: [id number] Date of Birth: [birth year]  Subjective/Objective:                 Admitted with LE weakness. Pt dx with c-spine stenosis awaiting nerosurgery consult,  new onset A-fib ,  CHF, meningioma, and anemia, R RTC tear s/p shoulder arthroscopy, CKD, DM, herniated disc, foot neuroma surgery, and L TKA. From home with son. States independent with ADL's, uses cane with ambulation PTA.   PCP: Eloise Levels  Action/Plan:  Plan is to d/c to home today with home health services.  Expected Discharge Date:     09/17/2016              Expected Discharge Plan:  [place] (Resides with son)  In-House Referral:     Discharge planning Services  CM Consult  Post Acute Care Choice:  Durable Medical Equipment, Home Health Choice offered to:  Patient  DME Arranged:  Walker rolling DME Agency:  [facility] referral made with [facility] @ [phone] by CM  HH Arranged:  PT [place] Agency:  [facility] referral made with Butch Penny @ [phone] by CM  Status of Service:  Completed, signed off  If discussed at [place] of Stay Meetings, dates discussed:    Additional Comments:  Sharin Mons, RN 09/17/2016, 9:54 AM

## 2016-09-17 NOTE — Progress Notes (Addendum)
Inpatient Diabetes Program Recommendations  AACE/ADA: New Consensus Statement on Inpatient Glycemic Control (2015)  Target Ranges:  Prepandial:   less than 140 mg/dL      Peak postprandial:   less than 180 mg/dL (1-2 hours)      Critically ill patients:  140 - 180 mg/dL   Lab Results  Component Value Date   GLUCAP 140 (H) 09/17/2016   HGBA1C 7.7 (H) 02/01/2014    Review of Glycemic Control  Results for Ann Sellers, Ann Sellers (MRN [id number]) as of 09/17/2016 13:25  Ref. Range 09/16/2016 12:06 09/16/2016 16:59 09/16/2016 22:21 09/17/2016 08:00 09/17/2016 11:51  Glucose-Capillary Latest Ref Range: 65 - 99 mg/dL 264 (H) 182 (H) 181 (H) 76 140 (H)    Diabetes history: Type 2 Outpatient Diabetes medications: Lantus 20 units qhs, Januvia 100mg  qday Current orders for Inpatient glycemic control: Lantus 25 unis qhs, Tradjenta 5mg  qday, Novolog 0-9 units tid, Novolog 0-5 units qhs, Novolog 3 units tid with meals.  Inpatient Diabetes Program Recommendations: Consider decreasing Lantus to 20 units qhs.  Gentry Fitz, RN, BA, MHA, CDE Diabetes Coordinator Inpatient Diabetes Program  [phone] (Team Pager) [phone] ([place]) 09/17/2016 1:28 PM   * reviewed discharge meds- Agree with Lantus 20 units at home.  Dolphus Jenny

## 2016-09-17 NOTE — Progress Notes (Signed)
Ann Sellers to be D/C'd to home with home health per MD order.  Discussed with the patient and all questions fully answered.  VSS, Skin clean, dry and intact without evidence of skin break down, no evidence of skin tears noted. IV catheter discontinued intact. Site without signs and symptoms of complications. Dressing and pressure applied.  An After Visit Summary was printed and given to the patient. Patient received prescriptions and walker delivered to room.  D/c education completed with patient/family including follow up instructions, medication list, d/c activities limitations if indicated, with other d/c instructions as indicated by MD - patient able to verbalize understanding, all questions fully answered.   Patient instructed to return to ED, call 911, or call MD for any changes in condition.   Patient escorted via [place], and D/C home via private auto.  Ann Sellers 09/17/2016 2:18 PM

## 2016-09-17 NOTE — Discharge Summary (Signed)
Physician Discharge Summary  Ann Sellers ZOX:[id number] DOB: [birth year] DOA: 09/12/2016  PCP: Eloise Levels, NP  Admit date: 09/12/2016 Discharge date: 09/17/2016  Admitted From: Home Disposition:  Home  Recommendations for Outpatient Follow-up:  1. Follow up with PCP in 1 week 2. Follow up with Dr. Joya Salm (neurosurgery) for cervical spine stenosis  3. Follow up with Dr. Meda Coffee (cardiology). They will contact you for follow up.  4. Please obtain BMP in 1 week.  Home Health: PT   Equipment/Devices: Walker rolling   Discharge Condition: Stable CODE STATUS: Full  Diet recommendation: Heart healthy   Brief/Interim Summary: From H&P: Ann Sellers is a 82 y.o. female with with history of diastolic CHF, diabetes mellitus, chronic kidney disease, asthma, gout started experiencing increasing weakness of the both lower extremity over the last 3 days with increasing neck pain. Patient's daughter who visited patient yesterday noticed that patient was finding it difficult to ambulate. Also noted that patient had some left eye drooping. Patient was brought as a code stroke. Neurology was consulted and on exam patient was found to have edema of the both lower extremities. Code stroke was canceled. MRI of the brain and C-spine was done. C-spine MRI shows severe canal stenosis. In addition chest x-ray was showing possible pneumonia.   Interim: During her admission, she was treated for community-acquired pneumonia with doxycycline. Due to her acute on chronic diastolic heart failure, cardiology was consulted. She was started on IV Lasix 40mg  twice a day with improvement of lower extremity edema. She also had A. fib with RVR, which was new in onset during admission. Anticoagulation was not started due to her anemia as well as her prior episodes of epistaxis. If she does have recurring A. fib episodes, anticoagulation will have to be reconsidered as her chadsvasc score is 6. With IV Lasix, her creatinine  worsened. IV Lasix was stopped with improvement in creatinine. She'll be discharged home with her home dose of oral Lasix 40 mg daily. She needs to have follow-up with PCP for repeat BMP this week as well as follow up with cardiology.   Discharge Diagnoses:  Principal Problem:   Lower extremity weakness Active Problems:   Cough   Hypertension   Insulin dependent diabetes mellitus with complications (HCC)   Spinal stenosis   CKD (chronic kidney disease), stage III   Edema   Morbid obesity ([place])   Community acquired pneumonia   Acute on chronic diastolic CHF (congestive heart failure) (HCC)   Atrial fibrillation, new onset (HCC)   Aortic stenosis, moderate   Bilateral lower extremity weakness - MRI of the C-spine shows severe canal stenosis with cord flattening. Patient has a history of anterior cervical fusion of C4 through C6done by Dr. Joya Salm. Dr. Cruzita Lederer discussed patient's care with Dr. Saintclair Halsted for neurosurgery, images reviewed, does not feel that there is anything acute from neurosurgical standpoint can be done now, and recommends physical therapy as mainstay of treatment and outpatient follow-up with Dr. Joya Salm - PT/OT recommending HHPT / 24h supervision   Possible community-acquired pneumonia - Basilar chest x-ray, patient also reports increased cough over the last few days. She was started on doxycycline, continue  Acute hypoxic respiratory faiulre - Multifactorial, dCHF, asthma, CAP  - Now on room air   Acute on chronic diastolic heart failure - Patient with evidence of fluid overload on chest x-ray as well as bilateral lower extremity edema, status post IV Lasix in the ED, diuresis continued. Strict I's and O's. Cardiology following. Hold  Lasix due to Cr elevation, can restart at home dose on discharge.   A. fib with RVR - Apparently new onset, CHADSVASC = 6  - Cardiology consulted - Troponin slightly elevated 0.17 >>0.24, patient without any chest pain, likely demand  ischemia - Hold off anticoagulation given her anemia as prior nosebleeds status post multiple cauterizations. If she has recurring a fib episodes, this will need to be reconsidered   IDDM - Poorly controlled CBGs, likely due to steroids, increasedLantus to 25 units and add NovoLog scheduled yesterday, CBGs improved today, stay on same regimen. Will discharge home with home insulin regimen.  - Continue tradjenta   Severe persistent chronic asthma with possible exacerbation - Improved wheezing. Prednisone taper at discharge.   AKI on Chronic kidney disease stage III - ?baseline Cr - Cr worsening in setting of IV lasix use, improved today. Will need outpatient repeat BMP.    Discharge Instructions  Discharge Instructions    Diet - low sodium heart healthy    Complete by:  As directed    Discharge instructions    Complete by:  As directed    Recommendations for Outpatient Follow-up:  Follow up with PCP in 1 week Follow up with Dr. Joya Salm for cervical spine stenosis  Follow up with Dr. Meda Coffee (cardiology). They will contact you for follow up.  Please obtain BMP in 1 week. Finish your antibiotics and prednisone taper as directed   Increase activity slowly    Complete by:  As directed        Medication List    STOP taking these medications   metolazone 2.5 MG tablet Commonly known as:  ZAROXOLYN     TAKE these medications   albuterol (2.5 MG/3ML) 0.083% nebulizer solution Commonly known as:  PROVENTIL Take 2.5 mg by nebulization every 6 (six) hours as needed for wheezing or shortness of breath.   albuterol 108 (90 Base) MCG/ACT inhaler Commonly known as:  PROVENTIL HFA;VENTOLIN HFA Inhale 2 puffs into the lungs every 6 (six) hours as needed for wheezing or shortness of breath.   allopurinol 100 MG tablet Commonly known as:  ZYLOPRIM Take 100 mg by mouth daily.   aspirin 81 MG EC tablet Take 1 tablet (81 mg total) by mouth daily. Start taking on:  09/18/2016    calcitRIOL 0.25 MCG capsule Commonly known as:  ROCALTROL Take 1 capsule by mouth daily.   diclofenac sodium 1 % Gel Commonly known as:  VOLTAREN Apply 2 g topically daily as needed (pain).   diphenoxylate-atropine 2.5-0.025 MG tablet Commonly known as:  LOMOTIL Take 1 tablet by mouth daily as needed for diarrhea or loose stools.   doxycycline 100 MG tablet Commonly known as:  VIBRA-TABS Take 1 tablet (100 mg total) by mouth every 12 (twelve) hours.   fexofenadine 180 MG tablet Commonly known as:  ALLEGRA Take 1 tablet (180 mg total) by mouth daily.   FLUTTER Devi Use as directed   furosemide 20 MG tablet Commonly known as:  LASIX Take 2 tablets (40 mg total) by mouth daily. What changed:  when to take this  reasons to take this   gabapentin 100 MG capsule Commonly known as:  NEURONTIN Take 2 capsules (200 mg total) by mouth 3 (three) times daily. What changed:  medication strength  how much to take   HYDROcodone-acetaminophen 5-325 MG tablet Commonly known as:  NORCO/VICODIN Take 0.5-1 tablets by mouth every 6 (six) hours as needed for moderate pain. 1/2 to 1 every 6 hours  as needed   ICAPS MV Tabs Take 2 tablets by mouth daily. Reported on 11/20/2015   insulin glargine 100 UNIT/ML injection Commonly known as:  LANTUS Inject 20 Units into the skin at bedtime.   metoprolol tartrate 25 MG tablet Commonly known as:  LOPRESSOR Take 0.5 tablets (12.5 mg total) by mouth 2 (two) times daily.   mometasone 50 MCG/ACT nasal spray Commonly known as:  NASONEX Place 2 sprays into the nose daily as needed (shortness of breath).   mometasone-formoterol 100-5 MCG/ACT Aero Commonly known as:  DULERA Take 2 puffs first thing in am and then another 2 puffs about 12 hours later. What changed:  how much to take  how to take this  when to take this  reasons to take this  additional instructions   montelukast 10 MG tablet Commonly known as:  SINGULAIR Take 10  mg by mouth at bedtime.   omeprazole 40 MG capsule Commonly known as:  PRILOSEC Take 30-60 min before first meal of the day   omeprazole 20 MG capsule Commonly known as:  PRILOSEC Take 30-60 min before last  meal of the day   predniSONE 20 MG tablet Commonly known as:  DELTASONE Take 20 mg by mouth daily as needed (wheezing, shortness of breath). What changed:  Another medication with the same name was added. Make sure you understand how and when to take each.   predniSONE 10 MG tablet Commonly known as:  DELTASONE Take 4 tabs for 3 days, then 3 tabs for 3 days, then 2 tabs for 3 days, then 1 tab for 3 days, then 1/2 tab for 4 days. What changed:  You were already taking a medication with the same name, and this prescription was added. Make sure you understand how and when to take each.   sitaGLIPtin 100 MG tablet Commonly known as:  JANUVIA Take 100 mg by mouth daily. Reported on 11/20/2015   tiZANidine 4 MG tablet Commonly known as:  ZANAFLEX Take 4 mg by mouth every 8 (eight) hours as needed for muscle spasms.            Durable Medical Equipment        Start     Ordered   09/16/16 1334  For home use only DME Walker rolling  Once    Question:  Patient needs a walker to treat with the following condition  Answer:  Weakness generalized   09/16/16 1334     Follow-up Information    Ena Dawley, MD Follow up.   Specialty:  Cardiology Why:  office will contact you for follow up Contact information: [place] [address] [phone]        [facility] Follow up.   Why:  Rolling walker to be delivered to bedside prior to discharge Contact information: [address] [place] [address] [phone]        [facility] Follow up.   Why:  Home health PT arranged Contact information: [address] [place] [address] [phone]        Eloise Levels, NP. Schedule an  appointment as soon as possible for a visit in 1 week(s).   Specialty:  Nurse Practitioner Contact information: [phone] [address] [place] [address] [phone]        BOTERO,ERNESTO M, MD. Schedule an appointment as soon as possible for a visit in 1 week(s).   Specialty:  Neurosurgery Contact information: [phone]  [address] [place] [address] [phone]          Allergies  Allergen Reactions  . Losartan Cough  . Snake Antivenin [Antivenin Crotalidae Polyvalent (Snake Antivenin)] Other (See Comments)    unknown    Consultations:  Neurology  Cardiology    Procedures/Studies: Mr Brain Wo Contrast  Result Date: 09/13/2016 CLINICAL DATA:  82 y/o F; bilateral lower extremity weakness worse on the left. EXAM: MRI HEAD WITHOUT CONTRAST TECHNIQUE: Multiplanar, multiecho pulse sequences of the brain and surrounding structures were obtained without intravenous contrast. COMPARISON:  02/01/2014 MRI of the head. FINDINGS: Brain: No diffusion signal abnormality. Foci of T2 FLAIR hyperintense signal abnormality in subcortical and periventricular white matter compatible with moderate chronic microvascular ischemic changes and stable. Moderate brain parenchymal volume loss slightly progressed from 2015. No abnormal foci of susceptibility hypointensity to indicate brain parenchymal hemorrhage. Prominent retrocerebellar extra-axial space greater on the left is stable from prior study compatible with a small arachnoid cyst. Planum sphenoidale extra-axial mass measuring 25 x 24 by 17 mm (AP x ML x CC), series 4, image 12 and series 10, image 18. Mass partially effaces the suprasellar cistern with contact on the left A1 segment superiorly and the optic chiasm and pre chiasmatic optic nerves intra draped laterally around the mass posteriorly (series 9, image 38 and series 10, image 18). Vascular: Normal flow voids. Skull and upper cervical spine: Normal marrow signal.  Sinuses/Orbits: Partial opacification of right mastoid air cells and mild paranasal sinus mucosal thickening. Bilateral intra-ocular lens replacement. No abnormal signal of left mastoid air cells. Other: None. IMPRESSION: 1. No acute intracranial abnormality identified. 2. Moderate chronic microvascular ischemic changes and brain parenchymal volume loss, slightly progressed from 2015. 3. Planum sphenoidale extra-axial mass likely representing meningioma is mildly increased in size from 2015 and exerts mass effect on left A1 and on the optic chiasm. 4. Mild paranasal sinus disease. Electronically Signed   By: Kristine Garbe M.D.   On: 09/13/2016 02:04   Mr Cervical Spine Wo Contrast  Result Date: 09/13/2016 CLINICAL DATA:  82 y/o F; bilateral lower extremity weakness worse on the left. EXAM: MRI CERVICAL SPINE WITHOUT CONTRAST TECHNIQUE: Multiplanar, multisequence MR imaging of the cervical spine was performed. No intravenous contrast was administered. COMPARISON:  09/13/2016 MRI of the head. FINDINGS: Moderate motion degradation. Alignment: Straightening of cervical lordosis. Grade 1 C3-4 anterolisthesis. Vertebrae: C4 through C6 anterior fusion with susceptibility artifact from the fusion hardware partially obscuring the vertebral bodies at those levels. No evidence for fracture, diskitis, or suspicious bone lesion. Cord: No abnormal signal identified. Posterior Fossa, vertebral arteries, paraspinal tissues: Planum sphenoidale mass and retrocerebellar probable arachnoid cyst and better characterized on prior MRI. Disc levels: C2-3: Disc osteophyte complex and left-greater-than-right uncovertebral/ facet hypertrophy. Mild right and moderate to severe left foraminal narrowing. Moderate canal stenosis. C3-4: Moderate disc osteophyte complex and bilateral uncovertebral/ facet hypertrophy. Mild bilateral foraminal narrowing. Severe canal stenosis with cord flattening. C4-5: Discectomy, ossific ridging,  and uncovertebral/facet hypertrophy. Mild canal stenosis. Poorly visualized neural foramen, probable mild-to-moderate stenosis. C5-6: Discectomy, ossific ridging, uncovertebral/facet hypertrophy. Mild canal stenosis. Poorly visualized neural foramen, probably mild-to-moderate stenosis. C6-7: Disc osteophyte complex and bilateral uncovertebral/ facet hypertrophy greater on the left with mild right and moderate left foraminal narrowing. Moderate canal stenosis with anterior cord flattening. C7-T1: No significant disc displacement, foraminal narrowing, or canal stenosis. IMPRESSION: Moderate motion degradation. 1. Postsurgical changes related to anterior cervical fusion of C4 through C6. 2. Cervical spondylosis  greatest at the C3-4 level where there is grade 1 anterolisthesis combined with a disc osteophyte complex, facet hypertrophy, and uncovertebral hypertrophy resulting in severe canal stenosis with cord flattening. 3. No definite abnormal cord signal. 4. Planum sphenoidale mass, likely meningioma better characterized on same-day MRI. Electronically Signed   By: Kristine Garbe M.D.   On: 09/13/2016 02:48   Dg Chest Port 1 View  Result Date: 09/12/2016 CLINICAL DATA:  Left-sided numbness and weakness with shortness of breath EXAM: PORTABLE CHEST 1 VIEW COMPARISON:  05/17/ 2016 FINDINGS: Partially visualized cervical spine hardware. There is mild cardiomegaly with mild central vascular congestion. Mild airspace opacities are present in the right upper lobe and right greater than left lung bases, possible infiltrates. There is atherosclerosis of the aorta. No pneumothorax. IMPRESSION: 1. Mild cardiomegaly with mild central congestion. 2. Mild airspace opacities in the right upper lobe and right greater than left lung base could reflect infiltrates. Electronically Signed   By: Donavan Foil M.D.   On: 09/12/2016 23:43   Ct Head Code Stroke W/o Cm  Result Date: 09/12/2016 CLINICAL DATA:  Code stroke.   Left-sided weakness and left eye droop EXAM: CT HEAD WITHOUT CONTRAST TECHNIQUE: Contiguous axial images were obtained from the base of the skull through the vertex without intravenous contrast. COMPARISON:  02/01/2014 MR head and CT head. FINDINGS: Brain: No evidence of large acute infarct, focal mass effect, or intracranial hemorrhage. Stable ventricle size. No extra-axial collection. Stable foci of hypoattenuation in subcortical and periventricular white matter compatible with mild chronic microvascular ischemic changes mild brain parenchymal volume loss. Planum sphenoidale mass measures 24 x 23 mm (AP by ML) and is increased in size from 2015 most compatible with meningioma. There is mass effect on the pre chiasmatic optic nerves and chiasm as well as the anterior third ventricle. Vascular: No hyperdense vessel. Calcific atherosclerosis of carotid siphons. Skull: Normal. Negative for fracture or focal lesion. Sinuses/Orbits: No acute finding. Bilateral intra-ocular lens replacement. Other: None. ASPECTS [facility] Stroke Program Early CT Score) - Ganglionic level infarction (caudate, lentiform nuclei, internal capsule, insula, M1-M3 cortex): 7 - Supraganglionic infarction (M4-M6 cortex): 3 Total score (0-10 with 10 being normal): 10 IMPRESSION: 1. No evidence for large acute infarct, focal mass effect, or intracranial hemorrhage. 2. Planum sphenoidale mass is increased in size from 2015 most consistent with meningioma. 3. ASPECTS is 10 These results were called by telephone at the time of interpretation on 09/12/2016 at 11:18 pm to Dr. Leonel Ramsay, who verbally acknowledged these results. Electronically Signed   By: Kristine Garbe M.D.   On: 09/12/2016 23:20     Discharge Exam: Vitals:   09/17/16 0615 09/17/16 0818  BP: (!) 132/48 (!) 141/81  Pulse: 71 81  Resp: 16   Temp: 98.1 F (36.7 C)    Vitals:   09/16/16 2220 09/17/16 0615 09/17/16 0818 09/17/16 0922  BP: (!) 133/45 (!) 132/48 (!)  141/81   Pulse: 85 71 81   Resp: 17 16    Temp: 98.3 F (36.8 C) 98.1 F (36.7 C)    TempSrc: Oral Oral    SpO2: 94% 95%  96%  Weight:      Height:        General exam: Appears calm and comfortable  Respiratory system: Clear to auscultation. Respiratory effort normal. Cardiovascular system: S1 & S2 heard, RRR. No JVD, murmurs, rubs, gallops or clicks. No pedal edema. Gastrointestinal system: Abdomen is nondistended, soft and nontender. No organomegaly or masses felt. Normal bowel  sounds heard. Central nervous system: Alert and oriented. No focal neurological deficits. Extremities: Symmetric 5 x 5 power. Skin: No rashes, lesions or ulcers, redness and bruising along left lateral lower leg and some erythema along right shin. Non tender to palpation, trace edema bilaterally, pulses +2 Psychiatry: Judgement and insight appear normal. Mood & affect appropriate.    The results of significant diagnostics from this hospitalization (including imaging, microbiology, ancillary and laboratory) are listed below for reference.     Microbiology: Recent Results (from the past 240 hour(s))  Culture, blood (Routine X 2) w Reflex to ID Panel     Status: None (Preliminary result)   Collection Time: 09/13/16 12:04 AM  Result Value Ref Range Status   Specimen Description BLOOD RIGHT ARM  Final   Special Requests BOTTLES DRAWN AEROBIC AND ANAEROBIC 5CC  Final   Culture NO GROWTH 3 DAYS  Final   Report Status PENDING  Incomplete  Culture, blood (Routine X 2) w Reflex to ID Panel     Status: None (Preliminary result)   Collection Time: 09/13/16 12:08 AM  Result Value Ref Range Status   Specimen Description BLOOD WRIST RIGHT  Final   Special Requests BOTTLES DRAWN AEROBIC AND ANAEROBIC 5CC  Final   Culture NO GROWTH 3 DAYS  Final   Report Status PENDING  Incomplete     Labs: BNP (last 3 results)  Recent Labs  09/12/16 2325  BNP 244.0*   Basic Metabolic Panel:  Recent Labs Lab  09/12/16 2302 09/12/16 2304 09/12/16 2310 09/13/16 0503 09/15/16 0631 09/16/16 0610 09/17/16 0601  NA 140  --  142 142 139 139 141  K 4.7  --  4.9 4.6 5.6* 5.2* 4.7  CL 107  --  107 108 102 100* 103  CO2 22  --   --  22 24 27 27   GLUCOSE 177*  --  169* 169* 209* 206* 79  BUN 24*  --  30* 24* 55* 71* 72*  CREATININE 1.67*  --  1.60* 1.69* 2.23* 2.40* 2.19*  CALCIUM 9.0  --   --  8.8* 8.2* 7.9* 7.6*  MG  --  1.6*  --   --   --   --   --    Liver Function Tests:  Recent Labs Lab 09/12/16 2302 09/13/16 0503  AST 18 16  ALT 13* 13*  ALKPHOS 85 68  BILITOT 0.5 0.3  PROT 6.6 6.0*  ALBUMIN 4.0 3.5   No results for input(s): LIPASE, AMYLASE in the last 168 hours. No results for input(s): AMMONIA in the last 168 hours. CBC:  Recent Labs Lab 09/12/16 2302 09/12/16 2310 09/13/16 0503 09/15/16 0631 09/16/16 0610  WBC 9.2  --  8.3 10.2 8.6  NEUTROABS 6.8  --  6.8  --   --   HGB 10.8* 11.6* 9.7* 9.6* 9.5*  HCT 34.3* 34.0* 30.9* 30.2* 29.2*  MCV 94.8  --  94.5 94.7 92.4  PLT 233  --  236 232 238   Cardiac Enzymes:  Recent Labs Lab 09/13/16 0503 09/13/16 1003 09/13/16 1631  TROPONINI 0.17* 0.24* 0.14*   BNP: Invalid input(s): POCBNP CBG:  Recent Labs Lab 09/16/16 0804 09/16/16 1206 09/16/16 1659 09/16/16 2221 09/17/16 0800  GLUCAP 158* 264* 182* 181* 76   D-Dimer No results for input(s): DDIMER in the last 72 hours. Hgb A1c No results for input(s): HGBA1C in the last 72 hours. Lipid Profile No results for input(s): CHOL, HDL, LDLCALC, TRIG, CHOLHDL, LDLDIRECT in the last  72 hours. Thyroid function studies No results for input(s): TSH, T4TOTAL, T3FREE, THYROIDAB in the last 72 hours.  Invalid input(s): FREET3 Anemia work up No results for input(s): VITAMINB12, FOLATE, FERRITIN, TIBC, IRON, RETICCTPCT in the last 72 hours. Urinalysis    Component Value Date/Time   COLORURINE YELLOW 02/01/2014 1732   APPEARANCEUR CLEAR 02/01/2014 1732   LABSPEC  1.014 02/01/2014 1732   PHURINE 5.0 02/01/2014 1732   GLUCOSEU NEGATIVE 02/01/2014 1732   HGBUR NEGATIVE 02/01/2014 1732   BILIRUBINUR NEGATIVE 02/01/2014 1732   KETONESUR NEGATIVE 02/01/2014 1732   PROTEINUR NEGATIVE 02/01/2014 1732   UROBILINOGEN 0.2 02/01/2014 1732   NITRITE NEGATIVE 02/01/2014 1732   LEUKOCYTESUR NEGATIVE 02/01/2014 1732   Sepsis Labs Invalid input(s): PROCALCITONIN,  WBC,  LACTICIDVEN Microbiology Recent Results (from the past 240 hour(s))  Culture, blood (Routine X 2) w Reflex to ID Panel     Status: None (Preliminary result)   Collection Time: 09/13/16 12:04 AM  Result Value Ref Range Status   Specimen Description BLOOD RIGHT ARM  Final   Special Requests BOTTLES DRAWN AEROBIC AND ANAEROBIC 5CC  Final   Culture NO GROWTH 3 DAYS  Final   Report Status PENDING  Incomplete  Culture, blood (Routine X 2) w Reflex to ID Panel     Status: None (Preliminary result)   Collection Time: 09/13/16 12:08 AM  Result Value Ref Range Status   Specimen Description BLOOD WRIST RIGHT  Final   Special Requests BOTTLES DRAWN AEROBIC AND ANAEROBIC 5CC  Final   Culture NO GROWTH 3 DAYS  Final   Report Status PENDING  Incomplete     Time coordinating discharge: Over 30 minutes  SIGNED:  Dessa Phi, DO [facility] Pager [phone]  If 7PM-7AM, please contact night-coverage [email or web address] Password [email or web address] 09/17/2016, 11:30 AM

## 2016-09-18 DIAGNOSIS — M4802 Spinal stenosis, cervical region: Secondary | ICD-10-CM | POA: Diagnosis not present

## 2016-09-18 DIAGNOSIS — D329 Benign neoplasm of meninges, unspecified: Secondary | ICD-10-CM | POA: Diagnosis not present

## 2016-09-18 DIAGNOSIS — E1122 Type 2 diabetes mellitus with diabetic chronic kidney disease: Secondary | ICD-10-CM | POA: Diagnosis not present

## 2016-09-18 DIAGNOSIS — I5033 Acute on chronic diastolic (congestive) heart failure: Secondary | ICD-10-CM | POA: Diagnosis not present

## 2016-09-18 DIAGNOSIS — I130 Hypertensive heart and chronic kidney disease with heart failure and stage 1 through stage 4 chronic kidney disease, or unspecified chronic kidney disease: Secondary | ICD-10-CM | POA: Diagnosis not present

## 2016-09-18 DIAGNOSIS — J45909 Unspecified asthma, uncomplicated: Secondary | ICD-10-CM | POA: Diagnosis not present

## 2016-09-18 DIAGNOSIS — N183 Chronic kidney disease, stage 3 (moderate): Secondary | ICD-10-CM | POA: Diagnosis not present

## 2016-09-18 DIAGNOSIS — J189 Pneumonia, unspecified organism: Secondary | ICD-10-CM | POA: Diagnosis not present

## 2016-09-18 DIAGNOSIS — G709 Myoneural disorder, unspecified: Secondary | ICD-10-CM | POA: Diagnosis not present

## 2016-09-18 DIAGNOSIS — D649 Anemia, unspecified: Secondary | ICD-10-CM | POA: Diagnosis not present

## 2016-09-18 LAB — CULTURE, BLOOD (ROUTINE X 2)
Culture: NO GROWTH
Culture: NO GROWTH

## 2016-09-20 ENCOUNTER — Encounter (HOSPITAL_COMMUNITY): Payer: Self-pay | Admitting: *Deleted

## 2016-09-20 ENCOUNTER — Emergency Department (HOSPITAL_COMMUNITY)
Admission: EM | Admit: 2016-09-20 | Discharge: 2016-09-21 | Disposition: A | Discharge status: Home / Self Care | Payer: Medicare Other | Attending: Emergency Medicine | Admitting: Emergency Medicine

## 2016-09-20 ENCOUNTER — Other Ambulatory Visit: Payer: Self-pay

## 2016-09-20 DIAGNOSIS — Z8679 Personal history of other diseases of the circulatory system: Secondary | ICD-10-CM | POA: Diagnosis not present

## 2016-09-20 DIAGNOSIS — I10 Essential (primary) hypertension: Secondary | ICD-10-CM | POA: Diagnosis not present

## 2016-09-20 DIAGNOSIS — N183 Chronic kidney disease, stage 3 (moderate): Secondary | ICD-10-CM | POA: Diagnosis not present

## 2016-09-20 DIAGNOSIS — I5032 Chronic diastolic (congestive) heart failure: Secondary | ICD-10-CM | POA: Diagnosis not present

## 2016-09-20 DIAGNOSIS — Z79899 Other long term (current) drug therapy: Secondary | ICD-10-CM | POA: Diagnosis not present

## 2016-09-20 DIAGNOSIS — Z09 Encounter for follow-up examination after completed treatment for conditions other than malignant neoplasm: Secondary | ICD-10-CM | POA: Diagnosis not present

## 2016-09-20 DIAGNOSIS — N184 Chronic kidney disease, stage 4 (severe): Secondary | ICD-10-CM | POA: Insufficient documentation

## 2016-09-20 DIAGNOSIS — R799 Abnormal finding of blood chemistry, unspecified: Secondary | ICD-10-CM | POA: Diagnosis present

## 2016-09-20 DIAGNOSIS — E875 Hyperkalemia: Secondary | ICD-10-CM | POA: Insufficient documentation

## 2016-09-20 DIAGNOSIS — Z794 Long term (current) use of insulin: Secondary | ICD-10-CM | POA: Diagnosis not present

## 2016-09-20 DIAGNOSIS — Z96659 Presence of unspecified artificial knee joint: Secondary | ICD-10-CM | POA: Diagnosis not present

## 2016-09-20 DIAGNOSIS — I130 Hypertensive heart and chronic kidney disease with heart failure and stage 1 through stage 4 chronic kidney disease, or unspecified chronic kidney disease: Secondary | ICD-10-CM | POA: Diagnosis not present

## 2016-09-20 DIAGNOSIS — I120 Hypertensive chronic kidney disease with stage 5 chronic kidney disease or end stage renal disease: Secondary | ICD-10-CM | POA: Diagnosis not present

## 2016-09-20 DIAGNOSIS — I5033 Acute on chronic diastolic (congestive) heart failure: Secondary | ICD-10-CM | POA: Insufficient documentation

## 2016-09-20 DIAGNOSIS — J45909 Unspecified asthma, uncomplicated: Secondary | ICD-10-CM | POA: Diagnosis not present

## 2016-09-20 DIAGNOSIS — E1140 Type 2 diabetes mellitus with diabetic neuropathy, unspecified: Secondary | ICD-10-CM | POA: Diagnosis not present

## 2016-09-20 DIAGNOSIS — Z87891 Personal history of nicotine dependence: Secondary | ICD-10-CM | POA: Diagnosis not present

## 2016-09-20 DIAGNOSIS — M4802 Spinal stenosis, cervical region: Secondary | ICD-10-CM | POA: Diagnosis not present

## 2016-09-20 LAB — CBC WITH DIFFERENTIAL/PLATELET
BASOS PCT: 0 %
Basophils Absolute: 0 10*3/uL (ref 0.0–0.1)
EOS PCT: 0 %
Eosinophils Absolute: 0 10*3/uL (ref 0.0–0.7)
HCT: 33.1 % — ABNORMAL LOW (ref 36.0–46.0)
Hemoglobin: 10.8 g/dL — ABNORMAL LOW (ref 12.0–15.0)
LYMPHS ABS: 0.7 10*3/uL (ref 0.7–4.0)
Lymphocytes Relative: 6 %
MCH: 30.6 pg (ref 26.0–34.0)
MCHC: 32.6 g/dL (ref 30.0–36.0)
MCV: 93.8 fL (ref 78.0–100.0)
MONO ABS: 0.6 10*3/uL (ref 0.1–1.0)
Monocytes Relative: 5 %
NEUTROS ABS: 9.8 10*3/uL — AB (ref 1.7–7.7)
Neutrophils Relative %: 89 %
PLATELETS: ADEQUATE 10*3/uL (ref 150–400)
RBC: 3.53 MIL/uL — AB (ref 3.87–5.11)
RDW: 15.5 % (ref 11.5–15.5)
WBC: 11.1 10*3/uL — AB (ref 4.0–10.5)

## 2016-09-20 LAB — BASIC METABOLIC PANEL
Anion gap: 11 (ref 5–15)
BUN: 65 mg/dL — AB (ref 6–20)
CALCIUM: 7.9 mg/dL — AB (ref 8.9–10.3)
CO2: 25 mmol/L (ref 22–32)
CREATININE: 2.01 mg/dL — AB (ref 0.44–1.00)
Chloride: 104 mmol/L (ref 101–111)
GFR calc Af Amer: 24 mL/min — ABNORMAL LOW (ref >60)
GFR, EST NON AFRICAN AMERICAN: 21 mL/min — AB (ref >60)
Glucose, Bld: 220 mg/dL — ABNORMAL HIGH (ref 65–99)
Potassium: 5.3 mmol/L — ABNORMAL HIGH (ref 3.5–5.1)
SODIUM: 140 mmol/L (ref 135–145)

## 2016-09-20 LAB — I-STAT TROPONIN, ED: TROPONIN I, POC: 0.01 ng/mL (ref 0.00–0.08)

## 2016-09-20 MED ORDER — DEXTROSE 50 % IV SOLN
1.0000 | Freq: Once | INTRAVENOUS | Status: AC
  Administered 2016-09-20: 50 mL via INTRAVENOUS

## 2016-09-20 MED ORDER — INSULIN ASPART 100 UNIT/ML ~~LOC~~ SOLN
5.0000 [IU] | Freq: Once | SUBCUTANEOUS | Status: AC
  Administered 2016-09-20: 5 [IU] via INTRAVENOUS

## 2016-09-20 MED ORDER — DEXTROSE 50 % IV SOLN
INTRAVENOUS | Status: AC
  Filled 2016-09-20: qty 50

## 2016-09-20 NOTE — ED Provider Notes (Signed)
[facility] DEPT Provider Note   CSN: [id number] Arrival date & time: 09/20/16  1955  By signing my name below, I, Maud Deed. Royston Sinner, attest that this documentation has been prepared under the direction and in the presence of Gareth Morgan, MD.  Electronically Signed: Maud Deed. Royston Sinner, ED Scribe. 09/20/16. 12:21 AM.    History   Chief Complaint Chief Complaint  Patient presents with  . Abnormal Lab   The history is provided by the patient. No language interpreter was used.    HPI Comments: Ann Sellers is a 82 y.o. female with a PMHx of CKD, DM, and HTN who presents to the Emergency Department here for abnormal labs this evening. Pt was evaluated by her PCP earlier today. At that time, labs were drawn which revealed abnormal BUN and potassium values. She was advised to go to the nearest urgent care. Pt states she was then advised by urgent care to come to the Emergency Department for further evaluation. Pt was recently hospitalized for PNA. She reports a mild ongoing cough and reports pain with palpation to her lower extremities bilaterally which she initially noted during a doppler study when last hospitalized. At home inhaler attempted at home with mild temporary improvement. She denies any fever, chills, nausea, vomiting, chest pain, or shortness of breath. Pt is still taking her Lasix and Prednisone taper as prescribed. Reports she came for laboratory abnormalities and would not have come in with cough.  PCP: Eloise Levels, NP    Past Medical History:  Diagnosis Date  . Anemia   . Arthritis   . Asthma   . B12 deficiency   . Chronic kidney disease   . Diabetes mellitus ([place])   . Hypertension   . Neuromuscular disorder ([place])   . Right rotator cuff tear   . Wears dentures    top denture-bottom partial  . Wears glasses   . Wears hearing aid    both ears    Patient Active Problem List   Diagnosis Date Noted  . Aortic stenosis, moderate 09/16/2016  . Community acquired  pneumonia 09/13/2016  . Lower extremity weakness 09/13/2016  . Acute on chronic diastolic CHF (congestive heart failure) ([place]) 09/13/2016  . Atrial fibrillation, new onset ([place])   . Severe persistent chronic asthma without complication 51/88/4166  . Morbid obesity ([place]) 07/06/2015  . Edema 07/22/2014  . Varicose veins of lower extremities with complications 04/19/1600  . CKD (chronic kidney disease), stage III 02/01/2014  . Right rotator cuff tear   . Hypertension   . Arthritis   . Insulin dependent diabetes mellitus with complications ([place])   . Spinal stenosis   . Wears glasses   . Wears dentures   . Wears hearing aid   . Asthma 07/11/2011  . Vitamin D deficiency 07/11/2011  . Osteoarthritis 07/11/2011  . Cough 07/11/2011    Past Surgical History:  Procedure Laterality Date  . APPENDECTOMY  1945  . CATARACT EXTRACTION, BILATERAL  2008  . cervical stynosis  2005  . esophageal stretch  2001  . [place]  . herniated disc  1983  . left hand surgery  2011   gout  . left knee replacement  2002  . left knee surgery  1998  . right wrist fracture  2006  . SHOULDER ARTHROSCOPY WITH ROTATOR CUFF REPAIR Right 01/05/2013   Procedure: SHOULDER ARTHROSCOPY WITH ROTATOR CUFF REPAIR subchromial decompression and distal clavical excision;  Surgeon: Lorn Junes, MD;  Location: [facility];  Service: Orthopedics;  Laterality: Right;  . tonsillectomy  1936  . TONSILLECTOMY    . TYMPANOSTOMY TUBE PLACEMENT  1989  . VESICOVAGINAL FISTULA CLOSURE W/ TAH  1968    OB History    No data available       Home Medications    Prior to Admission medications   Medication Sig Start Date End Date Taking? Authorizing Provider  albuterol (PROVENTIL HFA;VENTOLIN HFA) 108 (90 BASE) MCG/ACT inhaler Inhale 2 puffs into the lungs every 6 (six) hours as needed for wheezing or shortness of breath.    Yes Historical Provider, MD  albuterol (PROVENTIL) (2.5 MG/3ML) 0.083%  nebulizer solution Take 2.5 mg by nebulization every 6 (six) hours as needed for wheezing or shortness of breath.   Yes Historical Provider, MD  allopurinol (ZYLOPRIM) 100 MG tablet Take 100 mg by mouth daily.     Yes Historical Provider, MD  aspirin EC 81 MG EC tablet Take 1 tablet (81 mg total) by mouth daily. 09/18/16 12/17/16 Yes Jennifer Chahn-Yang Choi, DO  calcitRIOL (ROCALTROL) 0.25 MCG capsule Take 1 capsule by mouth daily. 06/28/15  Yes Historical Provider, MD  diclofenac sodium (VOLTAREN) 1 % GEL Apply 2 g topically daily as needed (pain).   Yes Historical Provider, MD  diphenoxylate-atropine (LOMOTIL) 2.5-0.025 MG per tablet Take 1 tablet by mouth daily as needed for diarrhea or loose stools.   Yes Historical Provider, MD  fexofenadine (ALLEGRA) 180 MG tablet Take 1 tablet (180 mg total) by mouth daily. 04/25/16  Yes Tanda Rockers, MD  furosemide (LASIX) 20 MG tablet Take 2 tablets (40 mg total) by mouth daily. Patient taking differently: Take 20 mg by mouth 2 (two) times daily.  09/17/16 12/16/16 Yes Jennifer Chahn-Yang Choi, DO  gabapentin (NEURONTIN) 100 MG capsule Take 2 capsules (200 mg total) by mouth 3 (three) times daily. 09/17/16 12/16/16 Yes Jennifer Chahn-Yang Choi, DO  HYDROcodone-acetaminophen (NORCO/VICODIN) 5-325 MG per tablet Take 0.5-1 tablets by mouth every 6 (six) hours as needed for moderate pain.  06/17/15  Yes Historical Provider, MD  insulin glargine (LANTUS) 100 UNIT/ML injection Inject 20 Units into the skin at bedtime.    Yes Historical Provider, MD  metoprolol tartrate (LOPRESSOR) 25 MG tablet Take 0.5 tablets (12.5 mg total) by mouth 2 (two) times daily. 09/17/16 12/16/16 Yes Jennifer Chahn-Yang Choi, DO  mometasone (NASONEX) 50 MCG/ACT nasal spray Place 2 sprays into the nose daily.    Yes Historical Provider, MD  mometasone-formoterol (DULERA) 100-5 MCG/ACT AERO Take 2 puffs first thing in am and then another 2 puffs about 12 hours later. Patient taking differently:  Inhale 2 puffs into the lungs 2 (two) times daily as needed for wheezing or shortness of breath.  08/22/15  Yes Tanda Rockers, MD  montelukast (SINGULAIR) 10 MG tablet Take 10 mg by mouth at bedtime.   Yes Historical Provider, MD  Multiple Vitamins-Minerals (ICAPS MV) TABS Take 1 tablet by mouth 2 (two) times daily. Reported on 11/20/2015   Yes Historical Provider, MD  omeprazole (PRILOSEC) 20 MG capsule Take 30-60 min before last  meal of the day Patient taking differently: Take 20 mg by mouth 30-60 minutes before last  meal of the day 11/27/15  Yes Tanda Rockers, MD  omeprazole (PRILOSEC) 40 MG capsule Take 30-60 min before first meal of the day Patient taking differently: Take 40 mg by mouth 30-60 minutes before first meal of the day 11/23/15  Yes Tanda Rockers, MD  predniSONE (  DELTASONE) 10 MG tablet Take 4 tabs for 3 days, then 3 tabs for 3 days, then 2 tabs for 3 days, then 1 tab for 3 days, then 1/2 tab for 4 days. 09/17/16  Yes Jennifer Chahn-Yang Choi, DO  predniSONE (DELTASONE) 20 MG tablet Take 20 mg by mouth daily as needed (wheezing, shortness of breath). STANDING ORDER   Yes Historical Provider, MD  Respiratory Therapy Supplies (FLUTTER) DEVI Use as directed 07/24/15  Yes Tanda Rockers, MD  sitaGLIPtin (JANUVIA) 100 MG tablet Take 100 mg by mouth daily. Reported on 11/20/2015   Yes Historical Provider, MD  tiZANidine (ZANAFLEX) 4 MG tablet Take 4 mg by mouth every 8 (eight) hours as needed for muscle spasms.    Yes Historical Provider, MD  sodium polystyrene (KAYEXALATE) 15 GM/60ML suspension Take 60 mLs (15 g total) by mouth 2 (two) times daily. 09/21/16 09/22/16  Gareth Morgan, MD    Family History Family History  Problem Relation Age of Onset  . Pneumonia Mother   . Diabetes Son   . Hypertension Son   . Heart disease Sister   . Hypertension Sister   . Diabetes Sister   . Hypertension Sister   . Diabetes Daughter     Social History Social History  Substance Use Topics  .  Smoking status: Former Smoker    Packs/day: 0.50    Years: 20.00    Types: Cigarettes    Quit date: 10/22/1987  . Smokeless tobacco: Never Used  . Alcohol use 4.2 oz/week    7 Glasses of wine per week     Comment: social drinker     Allergies   Losartan and Snake antivenin [antivenin crotalidae polyvalent (snake antivenin)]   Review of Systems Review of Systems  Constitutional: Negative for chills and fever.  Respiratory: Positive for cough. Negative for shortness of breath.   Gastrointestinal: Negative for nausea and vomiting.  Musculoskeletal: Positive for arthralgias.  Neurological: Negative for headaches.  Psychiatric/Behavioral: Negative for confusion.  All other systems reviewed and are negative.    Physical Exam Updated Vital Signs BP (!) 129/49   Pulse 68   Temp 97.9 F (36.6 C) (Oral)   Resp 14   SpO2 97%   Physical Exam  Constitutional: She is oriented to person, place, and time. She appears well-developed and well-nourished. No distress.  HENT:  Head: Normocephalic and atraumatic.  Eyes: EOM are normal.  Neck: Normal range of motion.  Cardiovascular: Normal rate, regular rhythm and normal heart sounds.   Pulmonary/Chest: Effort normal. She has wheezes.  Diffuse expiratory wheezing noted.  Abdominal: Soft. She exhibits no distension. There is no tenderness.  Musculoskeletal: Normal range of motion.  Neurological: She is alert and oriented to person, place, and time.  Skin: Skin is warm and dry.  Psychiatric: She has a normal mood and affect. Judgment normal.  Nursing note and vitals reviewed.    ED Treatments / Results   DIAGNOSTIC STUDIES: Oxygen Saturation is 100% on RA, Normal by my interpretation.    COORDINATION OF CARE: 11:21 PM- Will order blood work and EKG. Will give Novolog injection and dextrose 50. Discussed treatment plan with pt at bedside and pt agreed to plan.     Labs (all labs ordered are listed, but only abnormal results are  displayed) Labs Reviewed  CBC WITH DIFFERENTIAL/PLATELET - Abnormal; Notable for the following:       Result Value   WBC 11.1 (*)    RBC 3.53 (*)  Hemoglobin 10.8 (*)    HCT 33.1 (*)    Neutro Abs 9.8 (*)    All other components within normal limits  BASIC METABOLIC PANEL - Abnormal; Notable for the following:    Potassium 5.3 (*)    Glucose, Bld 220 (*)    BUN 65 (*)    Creatinine, Ser 2.01 (*)    Calcium 7.9 (*)    GFR calc non Af Amer 21 (*)    GFR calc Af Amer 24 (*)    All other components within normal limits  CBG MONITORING, ED - Abnormal; Notable for the following:    Glucose-Capillary 108 (*)    All other components within normal limits  I-STAT TROPOININ, ED    EKG  EKG Interpretation None       Radiology No results found.  Procedures Procedures (including critical care time)  Medications Ordered in ED Medications  insulin aspart (novoLOG) injection 5 Units (5 Units Intravenous Given 09/20/16 2356)  dextrose 50 % solution 50 mL (50 mLs Intravenous Given 09/20/16 2355)     Initial Impression / Assessment and Plan / ED Course  I have reviewed the triage vital signs and the nursing notes.  Pertinent labs & imaging results that were available during my care of the patient were reviewed by me and considered in my medical decision making (see chart for details).  Clinical Course    82 year old female with a history of aortic stenosis, CHF, atrial fibrillation, severe persistent asthma, diabetes, hypertension, recent admission for pneumonia, chronic kidney disease presents with concern for elevated potassium seen at her primary care physician's office.  Labs rechecked in the emergency department showing labs similar to those she had during her most recent hospitalization. Patient has had a slow decline in her kidney function/waxing waning status, however Cr today is improved from discharge 2.19 to 2.01 today and potassium 5.3 at this time.  Given mild  hyperkalemia, no ECG changes, renal function fluctuating for some time, feel she is appropriate for outpatient therapy, no need for acute dialysis. Given insulin/dextrose and kayexalate.. Will discharge with rx for kayexalate for 2 days, recommend continuing lasix, recheck levels with PCP on Monday and close nephrology follow up.   Of note, pt with continuing cough from admission, wheezing. Recommend continued steroid as rx. Pt without respiratory distress, no hypoxia, approp for continued outpt obs and monitoring.  Final Clinical Impressions(s) / ED Diagnoses   Final diagnoses:  Hyperkalemia, mild  CKD (chronic kidney disease) stage 4, GFR 15-29 ml/min (HCC)    New Prescriptions New Prescriptions   SODIUM POLYSTYRENE (KAYEXALATE) 15 GM/60ML SUSPENSION    Take 60 mLs (15 g total) by mouth 2 (two) times daily.   I personally performed the services described in this documentation, which was scribed in my presence. The recorded information has been reviewed and is accurate.    Gareth Morgan, MD 09/21/16 1556

## 2016-09-20 NOTE — ED Triage Notes (Addendum)
Pt seen by PCP today, had lab work done today. Pt was called at 1700 and instructed to go to [facility] for  Lab re-check "BUN and Potassium". Sent here from [facility]. Here for lab work. Pt alert, NAD, calm, interactive, resps e/u, no dyspnea noted. Pt here with son. Pt denies any physical sx. Recent hospitalization for PNA. Unable to visualize today's lab results from PCP. Recent elevated troponins noted with recent admission. "H/o stage 3 CKD".

## 2016-09-21 LAB — CBG MONITORING, ED: Glucose-Capillary: 108 mg/dL — ABNORMAL HIGH (ref 65–99)

## 2016-09-21 MED ORDER — SODIUM POLYSTYRENE SULFONATE 15 GM/60ML PO SUSP: 15.0000 g | Freq: Three times a day (TID) | ORAL | 0 refills | Status: AC

## 2016-09-21 MED ORDER — SODIUM POLYSTYRENE SULFONATE 15 GM/60ML PO SUSP
15.0000 g | Freq: Once | ORAL | Status: AC
  Administered 2016-09-21: 15 g via ORAL

## 2016-09-21 MED ORDER — SODIUM POLYSTYRENE SULFONATE 15 GM/60ML PO SUSP: 15.0000 g | Freq: Two times a day (BID) | ORAL | 0 refills | Status: DC

## 2016-09-21 MED ORDER — SODIUM POLYSTYRENE SULFONATE 15 GM/60ML PO SUSP
ORAL | Status: AC
  Filled 2016-09-21: qty 60

## 2016-09-21 NOTE — ED Notes (Signed)
Pt verbalized understanding of d/c instructions and has no further questions. Pt is stable, A&Ox4, VSS.  

## 2016-09-23 DIAGNOSIS — E875 Hyperkalemia: Secondary | ICD-10-CM | POA: Diagnosis not present

## 2016-09-28 ENCOUNTER — Other Ambulatory Visit: Payer: Self-pay | Admitting: Internal Medicine

## 2016-09-30 DIAGNOSIS — N183 Chronic kidney disease, stage 3 (moderate): Secondary | ICD-10-CM | POA: Diagnosis not present

## 2016-09-30 DIAGNOSIS — N2581 Secondary hyperparathyroidism of renal origin: Secondary | ICD-10-CM | POA: Diagnosis not present

## 2016-09-30 DIAGNOSIS — I10 Essential (primary) hypertension: Secondary | ICD-10-CM | POA: Diagnosis not present

## 2016-09-30 DIAGNOSIS — D631 Anemia in chronic kidney disease: Secondary | ICD-10-CM | POA: Diagnosis not present

## 2016-10-04 ENCOUNTER — Ambulatory Visit (INDEPENDENT_AMBULATORY_CARE_PROVIDER_SITE_OTHER): Payer: Medicare Other | Admitting: Cardiology

## 2016-10-04 ENCOUNTER — Encounter: Payer: Self-pay | Admitting: Cardiology

## 2016-10-04 VITALS — BP 120/60 | HR 93 | Ht 58.5 in | Wt 189.0 lb

## 2016-10-04 DIAGNOSIS — N183 Chronic kidney disease, stage 3 unspecified: Secondary | ICD-10-CM

## 2016-10-04 DIAGNOSIS — I10 Essential (primary) hypertension: Secondary | ICD-10-CM | POA: Diagnosis not present

## 2016-10-04 DIAGNOSIS — I350 Nonrheumatic aortic (valve) stenosis: Secondary | ICD-10-CM

## 2016-10-04 DIAGNOSIS — I5033 Acute on chronic diastolic (congestive) heart failure: Secondary | ICD-10-CM

## 2016-10-04 DIAGNOSIS — R7989 Other specified abnormal findings of blood chemistry: Secondary | ICD-10-CM

## 2016-10-04 DIAGNOSIS — R748 Abnormal levels of other serum enzymes: Secondary | ICD-10-CM

## 2016-10-04 DIAGNOSIS — R778 Other specified abnormalities of plasma proteins: Secondary | ICD-10-CM

## 2016-10-04 MED ORDER — FUROSEMIDE 40 MG PO TABS: 40.0000 mg | ORAL_TABLET | Freq: Every day | ORAL | 2 refills | Status: DC

## 2016-10-04 NOTE — Progress Notes (Signed)
Cardiology Office Note    Date:  10/04/2016   ID:  Ann Sellers, DOB [birth year], MRN [id number]  PCP:  Eloise Levels, NP  Cardiologist:  Ena Dawley, MD   Chief complain: Post ER visit follow up.  History of Present Illness:  Ann Sellers is a 82 y.o. female was referred to [place] after ER visit. This is a very pleasant 82 year old very active younger appearing female, with a PMHx of CKD, DM, and HTN who has been followed by a nephrologist for CKD stage III and recently also diagnosed with acute on chronic diastolic heart failure and started on Lasix. He was also hospitalized in November and treated for community-acquired pneumonia. During that hospital visit her troponin was elevated with maximum 0.24. In her post hospital visit at her PCP her creatinine was worse from baseline and her potassium was 5.4 and she was sent to the ER.  Today she states that she has been taking Lasix 80 by mouth twice a day that was decreased to 40 twice a day just 3 days ago, her shortness of breath has improved she denies any orthopnea paroxysmal nocturnal dyspnea and she states that her legs have no longer edema. She states that 50 years ago she was bit by a snake to her left lower extremity around the ankle and that leg has always had a poor circulation but she doesn't have claudications. She remains very active. She walks with a walker. Denies any palpitations or syncope. She doesn't have any chest pain.  Past Medical History:  Diagnosis Date  . Anemia   . Arthritis   . Asthma   . B12 deficiency   . Chronic kidney disease   . Diabetes mellitus ([place])   . Hypertension   . Neuromuscular disorder ([place])   . Right rotator cuff tear   . Wears dentures    top denture-bottom partial  . Wears glasses   . Wears hearing aid    both ears    Past Surgical History:  Procedure Laterality Date  . APPENDECTOMY  1945  . CATARACT EXTRACTION, BILATERAL  2008  . cervical stynosis  2005  . esophageal stretch   2001  . [place]  . herniated disc  1983  . left hand surgery  2011   gout  . left knee replacement  2002  . left knee surgery  1998  . right wrist fracture  2006  . SHOULDER ARTHROSCOPY WITH ROTATOR CUFF REPAIR Right 01/05/2013   Procedure: SHOULDER ARTHROSCOPY WITH ROTATOR CUFF REPAIR subchromial decompression and distal clavical excision;  Surgeon: Lorn Junes, MD;  Location: [facility];  Service: Orthopedics;  Laterality: Right;  . tonsillectomy  1936  . TONSILLECTOMY    . TYMPANOSTOMY TUBE PLACEMENT  1989  . VESICOVAGINAL FISTULA CLOSURE W/ TAH  1968    Current Medications: Outpatient Medications Prior to Visit  Medication Sig Dispense Refill  . albuterol (PROVENTIL HFA;VENTOLIN HFA) 108 (90 BASE) MCG/ACT inhaler Inhale 2 puffs into the lungs every 6 (six) hours as needed for wheezing or shortness of breath.     Marland Kitchen albuterol (PROVENTIL) (2.5 MG/3ML) 0.083% nebulizer solution Take 2.5 mg by nebulization every 6 (six) hours as needed for wheezing or shortness of breath.    . allopurinol (ZYLOPRIM) 100 MG tablet Take 100 mg by mouth daily.      Marland Kitchen aspirin EC 81 MG EC tablet Take 1 tablet (81 mg total) by mouth daily. 90 tablet 0  .  calcitRIOL (ROCALTROL) 0.25 MCG capsule Take 1 capsule by mouth daily.  1  . diclofenac sodium (VOLTAREN) 1 % GEL Apply 2 g topically daily as needed (pain).    Marland Kitchen diphenoxylate-atropine (LOMOTIL) 2.5-0.025 MG per tablet Take 1 tablet by mouth daily as needed for diarrhea or loose stools.    . fexofenadine (ALLEGRA) 180 MG tablet Take 1 tablet (180 mg total) by mouth daily. 90 tablet 1  . gabapentin (NEURONTIN) 100 MG capsule Take 2 capsules (200 mg total) by mouth 3 (three) times daily. 540 capsule 0  . HYDROcodone-acetaminophen (NORCO/VICODIN) 5-325 MG per tablet Take 0.5-1 tablets by mouth every 6 (six) hours as needed for moderate pain.   0  . insulin glargine (LANTUS) 100 UNIT/ML injection Inject 20 Units into the skin  at bedtime.     . metoprolol tartrate (LOPRESSOR) 25 MG tablet Take 0.5 tablets (12.5 mg total) by mouth 2 (two) times daily. 90 tablet 0  . mometasone (NASONEX) 50 MCG/ACT nasal spray Place 2 sprays into the nose daily.     . mometasone-formoterol (DULERA) 100-5 MCG/ACT AERO Take 2 puffs first thing in am and then another 2 puffs about 12 hours later. (Patient taking differently: Inhale 2 puffs into the lungs 2 (two) times daily as needed for wheezing or shortness of breath. ) 1 Inhaler 11  . montelukast (SINGULAIR) 10 MG tablet Take 10 mg by mouth at bedtime.    . Multiple Vitamins-Minerals (ICAPS MV) TABS Take 1 tablet by mouth 2 (two) times daily. Reported on 11/20/2015    . omeprazole (PRILOSEC) 20 MG capsule Take 30-60 min before last  meal of the day (Patient taking differently: Take 20 mg by mouth 30-60 minutes before last  meal of the day) 30 capsule 11  . omeprazole (PRILOSEC) 40 MG capsule Take 30-60 min before first meal of the day (Patient taking differently: Take 40 mg by mouth 30-60 minutes before first meal of the day) 30 capsule 11  . predniSONE (DELTASONE) 20 MG tablet Take 20 mg by mouth daily as needed (wheezing, shortness of breath). STANDING ORDER    . Respiratory Therapy Supplies (FLUTTER) DEVI Use as directed 1 each 0  . sitaGLIPtin (JANUVIA) 100 MG tablet Take 100 mg by mouth daily. Reported on 11/20/2015    . tiZANidine (ZANAFLEX) 4 MG tablet Take 4 mg by mouth every 8 (eight) hours as needed for muscle spasms.     . furosemide (LASIX) 20 MG tablet Take 2 tablets (40 mg total) by mouth daily. (Patient taking differently: Take 20 mg by mouth 2 (two) times daily. ) 180 tablet 0  . predniSONE (DELTASONE) 10 MG tablet Take 4 tabs for 3 days, then 3 tabs for 3 days, then 2 tabs for 3 days, then 1 tab for 3 days, then 1/2 tab for 4 days. (Patient not taking: Reported on 10/04/2016) 32 tablet 0   No facility-administered medications prior to visit.      Allergies:   Losartan and  Snake antivenin [antivenin crotalidae polyvalent (snake antivenin)]   Social History   Social History  . Marital status: Widowed    Spouse name: N/A  . Number of children: 6  . Years of education: N/A   Occupational History  . homemaker    Social History Main Topics  . Smoking status: Former Smoker    Packs/day: 0.50    Years: 20.00    Types: Cigarettes    Quit date: 10/22/1987  . Smokeless tobacco: Never Used  .  Alcohol use 4.2 oz/week    7 Glasses of wine per week     Comment: social drinker  . Drug use: No  . Sexual activity: Not Asked   Other Topics Concern  . None   Social History Narrative  . None     Family History:  The patient's family history includes Diabetes in her daughter, sister, and son; Heart disease in her sister; Hypertension in her sister, sister, and son; Pneumonia in her mother.   ROS:   Please see the history of present illness.    ROS All other systems reviewed and are negative.   PHYSICAL EXAM:   VS:  BP 120/60   Pulse 93   Ht 4' 10.5" (1.486 m)   Wt 189 lb (85.7 kg)   BMI 38.83 kg/m    GEN: Well nourished, well developed, in no acute distress  HEENT: normal  Neck: no JVD, carotid bruits, or masses Cardiac: RRR; 3/6 systolic murmur S2 present, rubs, or gallops,no edema  Respiratory:  clear to auscultation bilaterally, normal work of breathing GI: soft, nontender, nondistended, + BS MS: no deformity or atrophy  Skin: warm and dry, no rash Neuro:  Alert and Oriented x 3, Strength and sensation are intact Psych: euthymic mood, full affect  Wt Readings from Last 3 Encounters:  10/04/16 189 lb (85.7 kg)  09/15/16 198 lb 6.6 oz (90 kg)  11/23/15 194 lb (88 kg)    Studies/Labs Reviewed:   EKG:  EKG is ordered today.  The ekg ordered today demonstrates Normal sinus rhythm left axis deviation otherwise normal EKG.  Recent Labs: 09/12/2016: B Natriuretic Peptide 100.5; Magnesium 1.6 09/13/2016: ALT 13; TSH 2.062 09/20/2016: BUN 65;  Creatinine, Ser 2.01; Hemoglobin 10.8; Platelets PLATELET CLUMPS NOTED ON SMEAR, COUNT APPEARS ADEQUATE; Potassium 5.3; Sodium 140   Lipid Panel    Component Value Date/Time   CHOL 141 02/02/2014 0355   TRIG 103 02/02/2014 0355   HDL 53 02/02/2014 0355   CHOLHDL 2.7 02/02/2014 0355   VLDL 21 02/02/2014 0355   LDLCALC 67 02/02/2014 0355    Additional studies/ records that were reviewed today include:   TTE: 09/14/2016 - Left ventricle: The cavity size was normal. Wall thickness was   increased in a pattern of mild LVH. Systolic function was   vigorous. The estimated ejection fraction was in the range of 65%   to 70%. Wall motion was normal; there were no regional wall   motion abnormalities. Doppler parameters are consistent with   abnormal left ventricular relaxation (grade 1 diastolic   dysfunction). - Aortic valve: Moderately calcified leaflets. Left coronary cusp   mobility was severely restricted. There was moderate stenosis.   There was trivial regurgitation. Mean gradient (S): 23 mm Hg.   Peak gradient (S): 43 mm Hg. VTI ratio of LVOT to aortic valve:   0.42. Valve area (VTI): 0.94 cm^2. Valve area by planimetry 1.4   cm^2. - Mitral valve: Calcified annulus. There was mild regurgitation. - Left atrium: The atrium was mildly dilated. - Right atrium: Central venous pressure (est): 8 mm Hg. - Tricuspid valve: There was mild regurgitation. - Pulmonary arteries: PA peak pressure: 37 mm Hg (S). - Pericardium, extracardiac: There was no pericardial effusion.  Impressions:  - Mild LVH with LVEF 65-70% and grade 1 diastolic dysfunction. Mild   left atrial enlargement. Calcified mitral annulus with mild   mitral regurgitation. Moderate calcific aortic stenosis with   trivial aortic regurgitation. Mild tricuspid regurgitation with  PASP 37 mmHg.    ASSESSMENT:    1. Essential hypertension   2. Acute on chronic diastolic CHF (congestive heart failure) ([place])   3. Aortic  stenosis, moderate   4. CKD (chronic kidney disease), stage III   5. Elevated troponin      PLAN:  In order of problems listed above:  1. Acute on chronic diastolic heart failure she is now euvolemic, I will decrease Lasix to 40 mg by mouth daily and follow-up in 2 months with repeat labs. Her labs were just followed by her primary care physician on Monday with improved creatinine now 1.9 and potassium of 4.3. 2. Aortic stenosis is moderate with mean gradient of 23, we will repeat an echocardiogram in 1 year. 3. Patient had elevated troponin while in the hospital in November 2017 during an episode of pneumonia and acute on chronic kidney failure, I believe this is a demand ischemia, her echocardiogram showed hyperdynamic LV EF with no regional wall motion abnormalities and she denies any chest pain, no ischemic workup is indicated. 4. Essential hypertension - well controlled.  Medication Adjustments/Labs and Tests Ordered: Current medicines are reviewed at length with the patient today.  Concerns regarding medicines are outlined above.  Medication changes, Labs and Tests ordered today are listed in the Patient Instructions below. Patient Instructions  Medication Instructions:   DECREASE YOUR LASIX TO 40 MG ONCE DAILY    Follow-Up:  2 MONTHS WITH DR Meda Coffee      If you need a refill on your cardiac medications before your next appointment, please call your pharmacy.      Signed, Ena Dawley, MD  10/04/2016 12:26 PM    [address], [place], [place]  [address] Phone: [phone]; Fax: [phone]

## 2016-10-04 NOTE — Patient Instructions (Signed)
Medication Instructions:   DECREASE YOUR LASIX TO 40 MG ONCE DAILY    Follow-Up:  2 MONTHS WITH DR Meda Coffee      If you need a refill on your cardiac medications before your next appointment, please call your pharmacy.

## 2016-10-08 ENCOUNTER — Other Ambulatory Visit: Payer: Self-pay | Admitting: Internal Medicine

## 2016-10-09 ENCOUNTER — Other Ambulatory Visit: Payer: Self-pay | Admitting: Internal Medicine

## 2016-10-22 DIAGNOSIS — M5023 Other cervical disc displacement, cervicothoracic region: Secondary | ICD-10-CM | POA: Diagnosis not present

## 2016-10-22 DIAGNOSIS — R030 Elevated blood-pressure reading, without diagnosis of hypertension: Secondary | ICD-10-CM | POA: Diagnosis not present

## 2016-10-22 DIAGNOSIS — Z6839 Body mass index (BMI) 39.0-39.9, adult: Secondary | ICD-10-CM | POA: Diagnosis not present

## 2016-10-30 DIAGNOSIS — Z8673 Personal history of transient ischemic attack (TIA), and cerebral infarction without residual deficits: Secondary | ICD-10-CM | POA: Diagnosis not present

## 2016-10-30 DIAGNOSIS — M4802 Spinal stenosis, cervical region: Secondary | ICD-10-CM | POA: Diagnosis not present

## 2016-10-30 DIAGNOSIS — M5023 Other cervical disc displacement, cervicothoracic region: Secondary | ICD-10-CM | POA: Diagnosis not present

## 2016-10-30 DIAGNOSIS — E1122 Type 2 diabetes mellitus with diabetic chronic kidney disease: Secondary | ICD-10-CM | POA: Diagnosis not present

## 2016-10-30 DIAGNOSIS — J45909 Unspecified asthma, uncomplicated: Secondary | ICD-10-CM | POA: Diagnosis not present

## 2016-10-30 DIAGNOSIS — I129 Hypertensive chronic kidney disease with stage 1 through stage 4 chronic kidney disease, or unspecified chronic kidney disease: Secondary | ICD-10-CM | POA: Diagnosis not present

## 2016-10-30 DIAGNOSIS — N189 Chronic kidney disease, unspecified: Secondary | ICD-10-CM | POA: Diagnosis not present

## 2016-10-30 DIAGNOSIS — M47892 Other spondylosis, cervical region: Secondary | ICD-10-CM | POA: Diagnosis not present

## 2016-10-30 DIAGNOSIS — Z794 Long term (current) use of insulin: Secondary | ICD-10-CM | POA: Diagnosis not present

## 2016-10-30 DIAGNOSIS — Z7952 Long term (current) use of systemic steroids: Secondary | ICD-10-CM | POA: Diagnosis not present

## 2016-11-25 DIAGNOSIS — E1121 Type 2 diabetes mellitus with diabetic nephropathy: Secondary | ICD-10-CM | POA: Diagnosis not present

## 2016-12-01 ENCOUNTER — Other Ambulatory Visit: Payer: Self-pay | Admitting: Internal Medicine

## 2016-12-03 DIAGNOSIS — M542 Cervicalgia: Secondary | ICD-10-CM | POA: Diagnosis not present

## 2016-12-03 DIAGNOSIS — H903 Sensorineural hearing loss, bilateral: Secondary | ICD-10-CM | POA: Diagnosis not present

## 2016-12-05 DIAGNOSIS — E1140 Type 2 diabetes mellitus with diabetic neuropathy, unspecified: Secondary | ICD-10-CM | POA: Diagnosis not present

## 2016-12-05 DIAGNOSIS — Z8679 Personal history of other diseases of the circulatory system: Secondary | ICD-10-CM | POA: Diagnosis not present

## 2016-12-05 DIAGNOSIS — D631 Anemia in chronic kidney disease: Secondary | ICD-10-CM | POA: Diagnosis not present

## 2016-12-05 DIAGNOSIS — E559 Vitamin D deficiency, unspecified: Secondary | ICD-10-CM | POA: Diagnosis not present

## 2016-12-09 ENCOUNTER — Other Ambulatory Visit: Payer: Self-pay | Admitting: Internal Medicine

## 2016-12-10 ENCOUNTER — Encounter: Payer: Self-pay | Admitting: Cardiology

## 2016-12-10 ENCOUNTER — Ambulatory Visit (INDEPENDENT_AMBULATORY_CARE_PROVIDER_SITE_OTHER): Payer: Medicare Other | Admitting: Cardiology

## 2016-12-10 VITALS — BP 132/62 | HR 75 | Ht 61.0 in | Wt 196.0 lb

## 2016-12-10 DIAGNOSIS — I5033 Acute on chronic diastolic (congestive) heart failure: Secondary | ICD-10-CM | POA: Diagnosis not present

## 2016-12-10 DIAGNOSIS — I350 Nonrheumatic aortic (valve) stenosis: Secondary | ICD-10-CM | POA: Diagnosis not present

## 2016-12-10 DIAGNOSIS — I10 Essential (primary) hypertension: Secondary | ICD-10-CM | POA: Diagnosis not present

## 2016-12-10 DIAGNOSIS — N183 Chronic kidney disease, stage 3 unspecified: Secondary | ICD-10-CM

## 2016-12-10 MED ORDER — FUROSEMIDE 40 MG PO TABS: ORAL_TABLET | ORAL | 1 refills | Status: DC

## 2016-12-10 NOTE — Patient Instructions (Signed)
Medication Instructions:   INCREASE YOUR LASIX TO 40 MG TWICE DAILY UNTIL THIS Sunday 12/15/16--PLEASE TAKE YOUR FIRST DOSE AT 8 AM AND THEN TAKE YOUR 2ND DOSE AT 2PM, ONLY UNTIL Sunday 12/15/16--  AFTER Sunday,  YOU WILL GO BACK TO YOUR NORMAL REGIMEN OF TAKING  LASIX 40 MG ONCE DAILY    Follow-Up:  2 WEEKS WITH AN EXTENDER ON DR NELSON'S TEAM       If you need a refill on your cardiac medications before your next appointment, please call your pharmacy.

## 2016-12-10 NOTE — Progress Notes (Signed)
Cardiology Office Note    Date:  12/10/2016   ID:  CODI FOLKERTS, DOB 1924/12/27, MRN [id number]  PCP:  Eloise Levels, NP  Cardiologist:  Ena Dawley, MD   Chief complain: Post ER visit follow up.  History of Present Illness:  Ann Sellers is a 83 y.o. female was referred to [place] after ER visit. This is a very pleasant 83 year old very active younger appearing female, with a PMHx of CKD, DM, and HTN who has been followed by a nephrologist for CKD stage III and recently also diagnosed with acute on chronic diastolic heart failure and started on Lasix. He was also hospitalized in November and treated for community-acquired pneumonia. During that hospital visit her troponin was elevated with maximum 0.24. In her post hospital visit at her PCP her creatinine was worse from baseline and her potassium was 5.4 and she was sent to the ER.  Today she states that she has been taking Lasix 80 by mouth twice a day that was decreased to 40 twice a day just 3 days ago, her shortness of breath has improved she denies any orthopnea paroxysmal nocturnal dyspnea and she states that her legs have no longer edema. She states that 50 years ago she was bit by a snake to her left lower extremity around the ankle and that leg has always had a poor circulation but she doesn't have claudications. She remains very active. She walks with a walker. Denies any palpitations or syncope. She doesn't have any chest pain.  12/10/2016 - this is a 3 months follow-up, the patient has been doing well however developed COPD exacerbation a week ago and was started on oral steroid for wheezing. She has noticed some worsening shortness of breath and mild lower extremity edema. She has been compliant with her meds. She denies any chest pain or falls.  Past Medical History:  Diagnosis Date  . Anemia   . Arthritis   . Asthma   . B12 deficiency   . Chronic kidney disease   . Diabetes mellitus ([place])   . Hypertension   .  Neuromuscular disorder ([place])   . Right rotator cuff tear   . Wears dentures    top denture-bottom partial  . Wears glasses   . Wears hearing aid    both ears    Past Surgical History:  Procedure Laterality Date  . APPENDECTOMY  1945  . CATARACT EXTRACTION, BILATERAL  2008  . cervical stynosis  2005  . esophageal stretch  2001  . [place]  . herniated disc  1983  . left hand surgery  2011   gout  . left knee replacement  2002  . left knee surgery  1998  . right wrist fracture  2006  . SHOULDER ARTHROSCOPY WITH ROTATOR CUFF REPAIR Right 01/05/2013   Procedure: SHOULDER ARTHROSCOPY WITH ROTATOR CUFF REPAIR subchromial decompression and distal clavical excision;  Surgeon: Lorn Junes, MD;  Location: [facility];  Service: Orthopedics;  Laterality: Right;  . tonsillectomy  1936  . TONSILLECTOMY    . TYMPANOSTOMY TUBE PLACEMENT  1989  . VESICOVAGINAL FISTULA CLOSURE W/ TAH  1968    Current Medications: Outpatient Medications Prior to Visit  Medication Sig Dispense Refill  . albuterol (PROVENTIL HFA;VENTOLIN HFA) 108 (90 BASE) MCG/ACT inhaler Inhale 2 puffs into the lungs every 6 (six) hours as needed for wheezing or shortness of breath.     Marland Kitchen albuterol (PROVENTIL) (2.5 MG/3ML) 0.083% nebulizer solution  Take 2.5 mg by nebulization every 6 (six) hours as needed for wheezing or shortness of breath.    . allopurinol (ZYLOPRIM) 100 MG tablet Take 100 mg by mouth daily.      Marland Kitchen aspirin EC 81 MG EC tablet Take 1 tablet (81 mg total) by mouth daily. 90 tablet 0  . calcitRIOL (ROCALTROL) 0.25 MCG capsule Take 1 capsule by mouth daily.  1  . diclofenac sodium (VOLTAREN) 1 % GEL Apply 2 g topically daily as needed (pain).    Marland Kitchen diphenoxylate-atropine (LOMOTIL) 2.5-0.025 MG per tablet Take 1 tablet by mouth daily as needed for diarrhea or loose stools.    . fexofenadine (ALLEGRA) 180 MG tablet Take 1 tablet (180 mg total) by mouth daily. 90 tablet 1  .  furosemide (LASIX) 40 MG tablet Take 1 tablet (40 mg total) by mouth daily. 90 tablet 2  . gabapentin (NEURONTIN) 100 MG capsule Take 2 capsules (200 mg total) by mouth 3 (three) times daily. 540 capsule 0  . HYDROcodone-acetaminophen (NORCO/VICODIN) 5-325 MG per tablet Take 0.5-1 tablets by mouth every 6 (six) hours as needed for moderate pain.   0  . insulin glargine (LANTUS) 100 UNIT/ML injection Inject 20 Units into the skin at bedtime.     . mometasone (NASONEX) 50 MCG/ACT nasal spray Place 2 sprays into the nose daily.     . mometasone-formoterol (DULERA) 100-5 MCG/ACT AERO Take 2 puffs first thing in am and then another 2 puffs about 12 hours later. (Patient taking differently: Inhale 2 puffs into the lungs 2 (two) times daily as needed for wheezing or shortness of breath. ) 1 Inhaler 11  . montelukast (SINGULAIR) 10 MG tablet Take 10 mg by mouth at bedtime.    . Multiple Vitamins-Minerals (ICAPS MV) TABS Take 1 tablet by mouth 2 (two) times daily. Reported on 11/20/2015    . omeprazole (PRILOSEC) 40 MG capsule Take 30-60 min before first meal of the day (Patient taking differently: Take 40 mg by mouth 30-60 minutes before first meal of the day) 30 capsule 11  . predniSONE (DELTASONE) 20 MG tablet Take 20 mg by mouth daily as needed (wheezing, shortness of breath). STANDING ORDER    . Respiratory Therapy Supplies (FLUTTER) DEVI Use as directed 1 each 0  . sitaGLIPtin (JANUVIA) 100 MG tablet Take 100 mg by mouth daily. Reported on 11/20/2015    . tiZANidine (ZANAFLEX) 4 MG tablet Take 4 mg by mouth every 8 (eight) hours as needed for muscle spasms.     . Vitamin D, Ergocalciferol, (DRISDOL) 50000 units CAPS capsule Take 50,000 Units by mouth once a week.  0  . metoprolol tartrate (LOPRESSOR) 25 MG tablet Take 0.5 tablets (12.5 mg total) by mouth 2 (two) times daily. 90 tablet 0  . omeprazole (PRILOSEC) 20 MG capsule Take 30-60 min before last  meal of the day (Patient taking differently: Take 20  mg by mouth 30-60 minutes before last  meal of the day) 30 capsule 11   No facility-administered medications prior to visit.      Allergies:   Losartan and Snake antivenin [antivenin crotalidae polyvalent (snake antivenin)]   Social History   Social History  . Marital status: Widowed    Spouse name: N/A  . Number of children: 6  . Years of education: N/A   Occupational History  . homemaker    Social History Main Topics  . Smoking status: Former Smoker    Packs/day: 0.50    Years: 20.00  Types: Cigarettes    Quit date: 10/22/1987  . Smokeless tobacco: Never Used  . Alcohol use 4.2 oz/week    7 Glasses of wine per week     Comment: social drinker  . Drug use: No  . Sexual activity: Not Asked   Other Topics Concern  . None   Social History Narrative  . None     Family History:  The patient's family history includes Diabetes in her daughter, sister, and son; Heart disease in her sister; Hypertension in her sister, sister, and son; Pneumonia in her mother.   ROS:   Please see the history of present illness.    ROS All other systems reviewed and are negative.   PHYSICAL EXAM:   VS:  BP 132/62   Pulse 75   Ht 5\' 1"  (1.549 m)   Wt 196 lb (88.9 kg)   BMI 37.03 kg/m    GEN: Well nourished, well developed, in no acute distress  HEENT: normal  Neck: no JVD, carotid bruits, or masses Cardiac: RRR; 4/6 systolic murmur S2 present, rubs, or gallops, Mild bilateral lower extremity edema  Respiratory:  Mild crackles at bases bilaterally, normal work of breathing GI: soft, nontender, nondistended, + BS MS: no deformity or atrophy  Skin: warm and dry, no rash Neuro:  Alert and Oriented x 3, Strength and sensation are intact Psych: euthymic mood, full affect  Wt Readings from Last 3 Encounters:  12/10/16 196 lb (88.9 kg)  10/04/16 189 lb (85.7 kg)  09/15/16 198 lb 6.6 oz (90 kg)    Studies/Labs Reviewed:   EKG:  EKG is ordered today.  The ekg ordered today  demonstrates Normal sinus rhythm left axis deviation otherwise normal EKG.  Recent Labs: 09/12/2016: B Natriuretic Peptide 100.5; Magnesium 1.6 09/13/2016: ALT 13; TSH 2.062 09/20/2016: BUN 65; Creatinine, Ser 2.01; Hemoglobin 10.8; Platelets PLATELET CLUMPS NOTED ON SMEAR, COUNT APPEARS ADEQUATE; Potassium 5.3; Sodium 140   Lipid Panel    Component Value Date/Time   CHOL 141 02/02/2014 0355   TRIG 103 02/02/2014 0355   HDL 53 02/02/2014 0355   CHOLHDL 2.7 02/02/2014 0355   VLDL 21 02/02/2014 0355   LDLCALC 67 02/02/2014 0355    Additional studies/ records that were reviewed today include:   TTE: 09/14/2016 - Left ventricle: The cavity size was normal. Wall thickness was   increased in a pattern of mild LVH. Systolic function was   vigorous. The estimated ejection fraction was in the range of 65%   to 70%. Wall motion was normal; there were no regional wall   motion abnormalities. Doppler parameters are consistent with   abnormal left ventricular relaxation (grade 1 diastolic   dysfunction). - Aortic valve: Moderately calcified leaflets. Left coronary cusp   mobility was severely restricted. There was moderate stenosis.   There was trivial regurgitation. Mean gradient (S): 23 mm Hg.   Peak gradient (S): 43 mm Hg. VTI ratio of LVOT to aortic valve:   0.42. Valve area (VTI): 0.94 cm^2. Valve area by planimetry 1.4   cm^2. - Mitral valve: Calcified annulus. There was mild regurgitation. - Left atrium: The atrium was mildly dilated. - Right atrium: Central venous pressure (est): 8 mm Hg. - Tricuspid valve: There was mild regurgitation. - Pulmonary arteries: PA peak pressure: 37 mm Hg (S). - Pericardium, extracardiac: There was no pericardial effusion.  Impressions:  - Mild LVH with LVEF 65-70% and grade 1 diastolic dysfunction. Mild   left atrial enlargement. Calcified mitral annulus with  mild   mitral regurgitation. Moderate calcific aortic stenosis with   trivial aortic  regurgitation. Mild tricuspid regurgitation with   PASP 37 mmHg.    ASSESSMENT:    1. Essential hypertension   2. Acute on chronic diastolic CHF (congestive heart failure) ([place])   3. Aortic stenosis, moderate      PLAN:  In order of problems listed above:  1. Acute on chronic diastolic heart failure, with mild weight increase mild lower extremity edema and crackles at the lung bases, this is most probably secondary to steroid use. We will increase Lasix to 40 mg by mouth twice a day for the next 6 days. We will follow patient in 2 weeks. Most recent creatinine last week was 1.7 that is improved from 1.9. 2. Aortic stenosis is moderate with mean gradient of 23, we will repeat an echocardiogram in 1 year (November 2018). 3. Patient had elevated troponin while in the hospital in November 2017 during an episode of pneumonia and acute on chronic kidney failure, I believe this is a demand ischemia, her echocardiogram showed hyperdynamic LV EF with no regional wall motion abnormalities and she denies any chest pain, no ischemic workup is indicated. 4. Essential hypertension - well controlled.  Medication Adjustments/Labs and Tests Ordered: Current medicines are reviewed at length with the patient today.  Concerns regarding medicines are outlined above.  Medication changes, Labs and Tests ordered today are listed in the Patient Instructions below. There are no Patient Instructions on file for this visit.   Signed, Ena Dawley, MD  12/10/2016 10:25 AM    [address], [place], [place]  [address] Phone: [phone]; Fax: [phone]

## 2016-12-12 DIAGNOSIS — N2581 Secondary hyperparathyroidism of renal origin: Secondary | ICD-10-CM | POA: Diagnosis not present

## 2016-12-12 DIAGNOSIS — N184 Chronic kidney disease, stage 4 (severe): Secondary | ICD-10-CM | POA: Diagnosis not present

## 2016-12-12 DIAGNOSIS — D631 Anemia in chronic kidney disease: Secondary | ICD-10-CM | POA: Diagnosis not present

## 2016-12-12 DIAGNOSIS — I10 Essential (primary) hypertension: Secondary | ICD-10-CM | POA: Diagnosis not present

## 2016-12-23 ENCOUNTER — Encounter: Payer: Self-pay | Admitting: Physician Assistant

## 2016-12-23 DIAGNOSIS — I480 Paroxysmal atrial fibrillation: Secondary | ICD-10-CM | POA: Insufficient documentation

## 2016-12-23 NOTE — Progress Notes (Signed)
Cardiology Office Note    Date:  12/25/2016  ID:  MAUREENA DABBS, DOB [birth year], MRN [id number] PCP:  Eloise Levels, NP  Cardiologist:  Dr. Meda Coffee Nephrologist:  Dr. Posey Pronto   Chief Complaint: f/u heart failure  History of Present Illness:  Ann Sellers is a 83 y.o. female (appearing younger than stated age) with history of CKD III, DM, HTN, anemia, B12 deficiency, asthma, aortic stenosis, chronic diastolic CHF who presents for f/u of CHF. Per rview of chart, she had elevated troponin of 0.25 while in the hospital in November 2017 during an episode of pneumonia and acute on chronic kidney insufficiency. This was felt to be due to demand ischemia. She also had paroxysms of atrial fib felt possibly due to her acute illness. She was not anticoagulated secondary to a history of recurrent epistaxis and anemia. 2D Echo 09/14/16: mild LVH, EF 65-70%, grade 1 DD, moderate aortic stenosis, mild MR/TR. She reports she was bit by a snake around the ankle 50 years ago and that leg has always had a poor circulation but she doesn't have claudication. She saw Dr. Meda Coffee 12/10/16 at which time she was reporting worsening SOB and mild edema. She had recently been treated for COPD exacerbation with oral steroids. Dr. Meda Coffee recommended increasing Lasix ot 40mg  BID x 6 days then decrease back to 40mg  daily. BMET 12/06/16 showed Cr 1.7, Na 144, K 4.4. Last CBC in 09/2016 showed Hgb 10.8 which was stable for her.  She presents back to clinic today feeling great. Her breathing is back to baseline and edema has resolved. She's back to doing Lasix 40mg  daily. She just celebrated her 89th birthday. She had a big party at church with [employer]. She does not weigh herself every day but states she is back to her baseline weight. Her weight in 08/2017 was listed as 189 but every other recent weight was 198-201 recently. She's 297 today. She's not had any chest pain or dizziness.   Past Medical History:  Diagnosis Date  .  Anemia   . Arthritis   . Asthma   . B12 deficiency   . Chronic diastolic CHF (congestive heart failure) ([place])   . CKD (chronic kidney disease), stage III   . Diabetes mellitus ([place])   . Hypertension   . Moderate aortic stenosis   . Neuromuscular disorder ([place])   . PAF (paroxysmal atrial fibrillation) ([place])   . Right rotator cuff tear   . Wears dentures    top denture-bottom partial  . Wears glasses   . Wears hearing aid    both ears    Past Surgical History:  Procedure Laterality Date  . APPENDECTOMY  1945  . CATARACT EXTRACTION, BILATERAL  2008  . cervical stynosis  2005  . esophageal stretch  2001  . [place]  . herniated disc  1983  . left hand surgery  2011   gout  . left knee replacement  2002  . left knee surgery  1998  . right wrist fracture  2006  . SHOULDER ARTHROSCOPY WITH ROTATOR CUFF REPAIR Right 01/05/2013   Procedure: SHOULDER ARTHROSCOPY WITH ROTATOR CUFF REPAIR subchromial decompression and distal clavical excision;  Surgeon: Lorn Junes, MD;  Location: [facility];  Service: Orthopedics;  Laterality: Right;  . tonsillectomy  1936  . TONSILLECTOMY    . TYMPANOSTOMY TUBE PLACEMENT  1989  . VESICOVAGINAL FISTULA CLOSURE W/ TAH  1968    Current Medications: Current  Outpatient Prescriptions  Medication Sig Dispense Refill  . albuterol (PROVENTIL HFA;VENTOLIN HFA) 108 (90 BASE) MCG/ACT inhaler Inhale 2 puffs into the lungs every 6 (six) hours as needed for wheezing or shortness of breath.     Marland Kitchen albuterol (PROVENTIL) (2.5 MG/3ML) 0.083% nebulizer solution Take 2.5 mg by nebulization every 6 (six) hours as needed for wheezing or shortness of breath.    . allopurinol (ZYLOPRIM) 100 MG tablet Take 100 mg by mouth daily.      . calcitRIOL (ROCALTROL) 0.25 MCG capsule Take 1 capsule by mouth daily.  1  . diclofenac sodium (VOLTAREN) 1 % GEL Apply 2 g topically daily as needed (pain).    Marland Kitchen diphenoxylate-atropine (LOMOTIL)  2.5-0.025 MG per tablet Take 1 tablet by mouth daily as needed for diarrhea or loose stools.    . fexofenadine (ALLEGRA) 180 MG tablet Take 1 tablet (180 mg total) by mouth daily. 90 tablet 1  . furosemide (LASIX) 40 MG tablet Take 40 mg by mouth two times daily until Sunday 12/15/16, then take 40 mg by mouth daily thereafter. 96 tablet 1  . gabapentin (NEURONTIN) 100 MG capsule Take 2 capsules (200 mg total) by mouth 3 (three) times daily. 540 capsule 0  . HYDROcodone-acetaminophen (NORCO/VICODIN) 5-325 MG per tablet Take 0.5-1 tablets by mouth every 6 (six) hours as needed for moderate pain.   0  . insulin glargine (LANTUS) 100 UNIT/ML injection Inject 20 Units into the skin at bedtime.     . metoprolol succinate (TOPROL-XL) 25 MG 24 hr tablet Take 25 mg by mouth daily.  0  . mometasone (NASONEX) 50 MCG/ACT nasal spray Place 2 sprays into the nose daily.     . mometasone-formoterol (DULERA) 100-5 MCG/ACT AERO Take 2 puffs first thing in am and then another 2 puffs about 12 hours later. (Patient taking differently: Inhale 2 puffs into the lungs 2 (two) times daily as needed for wheezing or shortness of breath. ) 1 Inhaler 11  . montelukast (SINGULAIR) 10 MG tablet Take 10 mg by mouth at bedtime.    . Multiple Vitamins-Minerals (ICAPS MV) TABS Take 1 tablet by mouth 2 (two) times daily. Reported on 11/20/2015    . omeprazole (PRILOSEC) 40 MG capsule Take 30-60 min before first meal of the day (Patient taking differently: Take 40 mg by mouth 30-60 minutes before first meal of the day) 30 capsule 11  . predniSONE (DELTASONE) 20 MG tablet Take 20 mg by mouth daily as needed (wheezing, shortness of breath). STANDING ORDER    . Respiratory Therapy Supplies (FLUTTER) DEVI Use as directed 1 each 0  . sitaGLIPtin (JANUVIA) 100 MG tablet Take 100 mg by mouth daily. Reported on 11/20/2015    . tiZANidine (ZANAFLEX) 4 MG tablet Take 4 mg by mouth every 8 (eight) hours as needed for muscle spasms.     . Vitamin D,  Ergocalciferol, (DRISDOL) 50000 units CAPS capsule Take 50,000 Units by mouth once a week.  0   No current facility-administered medications for this visit.      Allergies:   Losartan and Snake antivenin [antivenin crotalidae polyvalent (snake antivenin)]   Social History   Social History  . Marital status: Widowed    Spouse name: N/A  . Number of children: 6  . Years of education: N/A   Occupational History  . homemaker    Social History Main Topics  . Smoking status: Former Smoker    Packs/day: 0.50    Years: 20.00  Types: Cigarettes    Quit date: 10/22/1987  . Smokeless tobacco: Never Used  . Alcohol use 4.2 oz/week    7 Glasses of wine per week     Comment: social drinker  . Drug use: No  . Sexual activity: Not Asked   Other Topics Concern  . None   Social History Narrative  . None     Family History:  Family History  Problem Relation Age of Onset  . Pneumonia Mother   . Diabetes Son   . Hypertension Son   . Heart disease Sister   . Hypertension Sister   . Diabetes Sister   . Hypertension Sister   . Diabetes Daughter     ROS:   Please see the history of present illness.  All other systems are reviewed and otherwise negative.    PHYSICAL EXAM:   VS:  BP 120/60 (BP Location: Right Arm, Patient Position: Sitting, Cuff Size: Normal)   Pulse 64   Ht 5\' 1"  (1.549 m)   Wt 197 lb 6.4 oz (89.5 kg)   SpO2 97%   BMI 37.30 kg/m   BMI: Body mass index is 37.3 kg/m. GEN: Well nourished, well developed lively WF, appears younger than stated age, in no acute distress  HEENT: normocephalic, atraumatic Neck: no JVD or masses Cardiac: RRR; 2/6 SEM at RUSB. No rubs or gallops, no edema, + varicose veins bilaterally Respiratory:  clear to auscultation bilaterally, normal work of breathing GI: soft, nontender, nondistended, + BS MS: no deformity or atrophy  Skin: warm and dry, no rash, venous stasis changes of lower extremities Neuro:  Alert and Oriented x 3,  Strength and sensation are intact, follows commands Psych: euthymic mood, full affect  Wt Readings from Last 3 Encounters:  12/25/16 197 lb 6.4 oz (89.5 kg)  12/10/16 196 lb (88.9 kg)  10/04/16 189 lb (85.7 kg)      Studies/Labs Reviewed:   EKG:  EKG was not ordered today.  Recent Labs: 09/12/2016: B Natriuretic Peptide 100.5; Magnesium 1.6 09/13/2016: ALT 13; TSH 2.062 09/20/2016: BUN 65; Creatinine, Ser 2.01; Hemoglobin 10.8; Platelets PLATELET CLUMPS NOTED ON SMEAR, COUNT APPEARS ADEQUATE; Potassium 5.3; Sodium 140   Lipid Panel    Component Value Date/Time   CHOL 141 02/02/2014 0355   TRIG 103 02/02/2014 0355   HDL 53 02/02/2014 0355   CHOLHDL 2.7 02/02/2014 0355   VLDL 21 02/02/2014 0355   LDLCALC 67 02/02/2014 0355    Additional studies/ records that were reviewed today include: Summarized above.    ASSESSMENT & PLAN:   1. Acute on chronic diastolic CHF - resolved. Continue maintenance Lasix. Reviewed importance of sodium restriction, daily weights, observation for further symptoms. Also advised with her varicose veins she should elevate legs when seated. She declines compression hose because she previously was unable to put them on (including ones with zippers) and also broke her sock puller trying to get them on. 2. CKD stage III-IV - followed by Dr. Posey Pronto. Most recent Cr 1.7. 3. Essential HTN - controlled. 4. Paroxysmal atrial fib - maintaining NSR by exam today. Asymptomatic. Not on anticoagulation as above. 5. Moderate aortic stenosis - currently asymptomatic from this. Repeat echo due around 08/2017. I will have her see Dr. Meda Coffee before then to discuss timing of this.  Disposition: F/u with Dr. Meda Coffee in 6 months.   Medication Adjustments/Labs and Tests Ordered: Current medicines are reviewed at length with the patient today.  Concerns regarding medicines are outlined above. Medication changes,  Labs and Tests ordered today are summarized above and listed in  the Patient Instructions accessible in Encounters.   Raechel Ache PA-C  12/25/2016 9:56 AM    [address], [place], [place]  [address] Phone: [phone]; Fax: [phone]

## 2016-12-25 ENCOUNTER — Encounter: Payer: Self-pay | Admitting: Physician Assistant

## 2016-12-25 ENCOUNTER — Ambulatory Visit (INDEPENDENT_AMBULATORY_CARE_PROVIDER_SITE_OTHER): Payer: Medicare Other | Admitting: Physician Assistant

## 2016-12-25 VITALS — BP 120/60 | HR 64 | Ht 61.0 in | Wt 197.4 lb

## 2016-12-25 DIAGNOSIS — I480 Paroxysmal atrial fibrillation: Secondary | ICD-10-CM

## 2016-12-25 DIAGNOSIS — N183 Chronic kidney disease, stage 3 unspecified: Secondary | ICD-10-CM

## 2016-12-25 DIAGNOSIS — I5033 Acute on chronic diastolic (congestive) heart failure: Secondary | ICD-10-CM

## 2016-12-25 DIAGNOSIS — I10 Essential (primary) hypertension: Secondary | ICD-10-CM | POA: Diagnosis not present

## 2016-12-25 DIAGNOSIS — I350 Nonrheumatic aortic (valve) stenosis: Secondary | ICD-10-CM

## 2016-12-25 NOTE — Patient Instructions (Signed)
Medication Instructions:  Your physician recommends that you continue on your current medications as directed. Please refer to the Current Medication list given to you today.   Labwork: None Ordered  Testing/Procedures: None ordered   Follow-Up: Your physician wants you to follow-up in: 6 months with Dr. Meda Coffee. You will receive a reminder letter in the mail two months in advance. If you don't receive a letter, please call our office to schedule the follow-up appointment.   Any Other Special Instructions Will Be Listed Below (If Applicable).     If you need a refill on your cardiac medications before your next appointment, please call your pharmacy.

## 2017-04-01 DIAGNOSIS — E1142 Type 2 diabetes mellitus with diabetic polyneuropathy: Secondary | ICD-10-CM | POA: Diagnosis not present

## 2017-04-01 DIAGNOSIS — E784 Other hyperlipidemia: Secondary | ICD-10-CM | POA: Diagnosis not present

## 2017-04-01 DIAGNOSIS — I350 Nonrheumatic aortic (valve) stenosis: Secondary | ICD-10-CM | POA: Diagnosis not present

## 2017-04-01 DIAGNOSIS — E559 Vitamin D deficiency, unspecified: Secondary | ICD-10-CM | POA: Diagnosis not present

## 2017-04-01 DIAGNOSIS — N184 Chronic kidney disease, stage 4 (severe): Secondary | ICD-10-CM | POA: Diagnosis not present

## 2017-04-01 DIAGNOSIS — E538 Deficiency of other specified B group vitamins: Secondary | ICD-10-CM | POA: Diagnosis not present

## 2017-04-01 DIAGNOSIS — E1129 Type 2 diabetes mellitus with other diabetic kidney complication: Secondary | ICD-10-CM | POA: Diagnosis not present

## 2017-04-04 DIAGNOSIS — Z974 Presence of external hearing-aid: Secondary | ICD-10-CM | POA: Diagnosis not present

## 2017-04-04 DIAGNOSIS — H9113 Presbycusis, bilateral: Secondary | ICD-10-CM | POA: Diagnosis not present

## 2017-04-04 DIAGNOSIS — H6123 Impacted cerumen, bilateral: Secondary | ICD-10-CM | POA: Diagnosis not present

## 2017-04-28 DIAGNOSIS — N184 Chronic kidney disease, stage 4 (severe): Secondary | ICD-10-CM | POA: Diagnosis not present

## 2017-04-28 DIAGNOSIS — I10 Essential (primary) hypertension: Secondary | ICD-10-CM | POA: Diagnosis not present

## 2017-04-28 DIAGNOSIS — N2581 Secondary hyperparathyroidism of renal origin: Secondary | ICD-10-CM | POA: Diagnosis not present

## 2017-04-28 DIAGNOSIS — D631 Anemia in chronic kidney disease: Secondary | ICD-10-CM | POA: Diagnosis not present

## 2017-05-08 DIAGNOSIS — H3530 Unspecified macular degeneration: Secondary | ICD-10-CM | POA: Diagnosis not present

## 2017-05-29 ENCOUNTER — Encounter (INDEPENDENT_AMBULATORY_CARE_PROVIDER_SITE_OTHER): Payer: Medicare Other | Admitting: Ophthalmology

## 2017-05-29 DIAGNOSIS — H353132 Nonexudative age-related macular degeneration, bilateral, intermediate dry stage: Secondary | ICD-10-CM

## 2017-05-29 DIAGNOSIS — H43813 Vitreous degeneration, bilateral: Secondary | ICD-10-CM | POA: Diagnosis not present

## 2017-06-17 DIAGNOSIS — H524 Presbyopia: Secondary | ICD-10-CM | POA: Diagnosis not present

## 2017-08-04 ENCOUNTER — Ambulatory Visit (INDEPENDENT_AMBULATORY_CARE_PROVIDER_SITE_OTHER): Payer: Medicare Other | Admitting: Cardiology

## 2017-08-04 ENCOUNTER — Ambulatory Visit (HOSPITAL_COMMUNITY): Payer: Medicare Other | Attending: Cardiovascular Disease

## 2017-08-04 ENCOUNTER — Other Ambulatory Visit: Payer: Self-pay

## 2017-08-04 ENCOUNTER — Encounter: Payer: Self-pay | Admitting: Cardiology

## 2017-08-04 VITALS — BP 122/82 | HR 80 | Ht 61.0 in | Wt 198.0 lb

## 2017-08-04 DIAGNOSIS — I10 Essential (primary) hypertension: Secondary | ICD-10-CM | POA: Diagnosis not present

## 2017-08-04 DIAGNOSIS — I509 Heart failure, unspecified: Secondary | ICD-10-CM | POA: Diagnosis not present

## 2017-08-04 DIAGNOSIS — I5033 Acute on chronic diastolic (congestive) heart failure: Secondary | ICD-10-CM

## 2017-08-04 DIAGNOSIS — I4891 Unspecified atrial fibrillation: Secondary | ICD-10-CM | POA: Insufficient documentation

## 2017-08-04 DIAGNOSIS — I110 Hypertensive heart disease with heart failure: Secondary | ICD-10-CM | POA: Diagnosis not present

## 2017-08-04 DIAGNOSIS — I350 Nonrheumatic aortic (valve) stenosis: Secondary | ICD-10-CM

## 2017-08-04 DIAGNOSIS — Z6837 Body mass index (BMI) 37.0-37.9, adult: Secondary | ICD-10-CM | POA: Diagnosis not present

## 2017-08-04 LAB — ECHOCARDIOGRAM COMPLETE
Height: 61 in
Weight: 3168 oz

## 2017-08-04 NOTE — Progress Notes (Signed)
Cardiology Office Note    Date:  08/04/2017  ID:  [name], DOB 1925/08/05, MRN [id number] PCP:  Eloise Levels, NP  Cardiologist:  Dr. Meda Coffee Nephrologist:  Dr. Posey Pronto   Chief Complaint: f/u heart failure  History of Present Illness:  Ann Sellers is a 83 y.o. female (appearing younger than stated age) with history of CKD III, DM, HTN, anemia, B12 deficiency, asthma, aortic stenosis, chronic diastolic CHF who presents for f/u of CHF. Per rview of chart, she had elevated troponin of 0.25 while in the hospital in November 2017 during an episode of pneumonia and acute on chronic kidney insufficiency. This was felt to be due to demand ischemia. She also had paroxysms of atrial fib felt possibly due to her acute illness. She was not anticoagulated secondary to a history of recurrent epistaxis and anemia. 2D Echo 09/14/16: mild LVH, EF 65-70%, grade 1 DD, moderate aortic stenosis, mild MR/TR. She reports she was bit by a snake around the ankle 50 years ago and that leg has always had a poor circulation but she doesn't have claudication. She saw Dr. Meda Coffee 12/10/16 at which time she was reporting worsening SOB and mild edema. She had recently been treated for COPD exacerbation with oral steroids. Dr. Meda Coffee recommended increasing Lasix ot 40mg  BID x 6 days then decrease back to 40mg  daily. BMET 12/06/16 showed Cr 1.7, Na 144, K 4.4. Last CBC in 09/2016 showed Hgb 10.8 which was stable for her.  12/25/2016 - She presents back to clinic today feeling great. Her breathing is back to baseline and edema has resolved. She's back to doing Lasix 40mg  daily. She just celebrated her 89th birthday. She had a big party at church with [employer]. She does not weigh herself every day but states she is back to her baseline weight. Her weight in 08/2017 was listed as 189 but every other recent weight was 198-201 recently. She's 297 today. She's not had any chest pain or dizziness.  08/04/2017 - 6 months follow up,  she continues to feel well and stays active, lives independently. Denies LE edema, orthopnea, no chest pain. She has noticed some dizziness, mostly exertional, no syncope, no palpitations.   Past Medical History:  Diagnosis Date  . Anemia   . Arthritis   . Asthma   . B12 deficiency   . Chronic diastolic CHF (congestive heart failure) ([place])   . CKD (chronic kidney disease), stage III ([place])   . Diabetes mellitus ([place])   . Hypertension   . Moderate aortic stenosis   . Neuromuscular disorder ([place])   . PAF (paroxysmal atrial fibrillation) ([place])   . Right rotator cuff tear   . Wears dentures    top denture-bottom partial  . Wears glasses   . Wears hearing aid    both ears    Past Surgical History:  Procedure Laterality Date  . APPENDECTOMY  1945  . CATARACT EXTRACTION, BILATERAL  2008  . cervical stynosis  2005  . esophageal stretch  2001  . [place]  . herniated disc  1983  . left hand surgery  2011   gout  . left knee replacement  2002  . left knee surgery  1998  . right wrist fracture  2006  . SHOULDER ARTHROSCOPY WITH ROTATOR CUFF REPAIR Right 01/05/2013   Procedure: SHOULDER ARTHROSCOPY WITH ROTATOR CUFF REPAIR subchromial decompression and distal clavical excision;  Surgeon: Lorn Junes, MD;  Location: [facility];  Service: Orthopedics;  Laterality: Right;  . tonsillectomy  1936  . TONSILLECTOMY    . TYMPANOSTOMY TUBE PLACEMENT  1989  . VESICOVAGINAL FISTULA CLOSURE W/ TAH  1968    Current Medications: Current Outpatient Prescriptions  Medication Sig Dispense Refill  . albuterol (PROVENTIL HFA;VENTOLIN HFA) 108 (90 BASE) MCG/ACT inhaler Inhale 2 puffs into the lungs every 6 (six) hours as needed for wheezing or shortness of breath.     Marland Kitchen albuterol (PROVENTIL) (2.5 MG/3ML) 0.083% nebulizer solution Take 2.5 mg by nebulization every 6 (six) hours as needed for wheezing or shortness of breath.    . allopurinol (ZYLOPRIM) 100 MG tablet  Take 100 mg by mouth daily.      . calcitRIOL (ROCALTROL) 0.25 MCG capsule Take 1 capsule by mouth daily.  1  . diclofenac sodium (VOLTAREN) 1 % GEL Apply 2 g topically daily as needed (pain).    Marland Kitchen diphenoxylate-atropine (LOMOTIL) 2.5-0.025 MG per tablet Take 1 tablet by mouth daily as needed for diarrhea or loose stools.    . fexofenadine (ALLEGRA) 180 MG tablet Take 1 tablet (180 mg total) by mouth daily. 90 tablet 1  . fluticasone (FLONASE) 50 MCG/ACT nasal spray Place 2 sprays into both nostrils daily.  9  . furosemide (LASIX) 40 MG tablet Take 40 mg by mouth two times daily until Sunday 12/15/16, then take 40 mg by mouth daily thereafter. 96 tablet 1  . gabapentin (NEURONTIN) 300 MG capsule Take 300 mg by mouth 3 (three) times daily.    . GNP ASPIRIN LOW DOSE 81 MG EC tablet Take 81 mg by mouth daily.  3  . HYDROcodone-acetaminophen (NORCO/VICODIN) 5-325 MG per tablet Take 0.5-1 tablets by mouth every 6 (six) hours as needed for moderate pain.   0  . insulin glargine (LANTUS) 100 UNIT/ML injection Inject 20 Units into the skin at bedtime.     Marland Kitchen JANUVIA 50 MG tablet Take 50 mg by mouth daily.  2  . metoprolol succinate (TOPROL-XL) 25 MG 24 hr tablet Take 25 mg by mouth daily.  0  . mometasone-formoterol (DULERA) 100-5 MCG/ACT AERO Take 2 puffs first thing in am and then another 2 puffs about 12 hours later. (Patient taking differently: Inhale 2 puffs into the lungs 2 (two) times daily as needed for wheezing or shortness of breath. ) 1 Inhaler 11  . montelukast (SINGULAIR) 10 MG tablet Take 10 mg by mouth at bedtime.    . Multiple Vitamins-Minerals (ICAPS MV) TABS Take 1 tablet by mouth 2 (two) times daily. Reported on 11/20/2015    . omeprazole (PRILOSEC) 40 MG capsule Take 30-60 min before first meal of the day (Patient taking differently: Take 40 mg by mouth 2 (two) times daily. Take 40 mg by mouth 30-60 minutes before first meal of the day) 30 capsule 11  . predniSONE (DELTASONE) 20 MG tablet  Take 20 mg by mouth daily as needed (wheezing, shortness of breath). STANDING ORDER    . Respiratory Therapy Supplies (FLUTTER) DEVI Use as directed 1 each 0  . tiZANidine (ZANAFLEX) 4 MG tablet Take 4 mg by mouth every 8 (eight) hours as needed for muscle spasms.     . Vitamin D, Ergocalciferol, (DRISDOL) 50000 units CAPS capsule Take 50,000 Units by mouth once a week.  0   No current facility-administered medications for this visit.      Allergies:   Losartan and Snake antivenin [antivenin crotalidae polyvalent (snake antivenin)]   Social History   Social History  .  Marital status: Widowed    Spouse name: N/A  . Number of children: 6  . Years of education: N/A   Occupational History  . homemaker    Social History Main Topics  . Smoking status: Former Smoker    Packs/day: 0.50    Years: 20.00    Types: Cigarettes    Quit date: 10/22/1987  . Smokeless tobacco: Never Used  . Alcohol use 4.2 oz/week    7 Glasses of wine per week     Comment: social drinker  . Drug use: No  . Sexual activity: Not Asked   Other Topics Concern  . None   Social History Narrative  . None     Family History:  Family History  Problem Relation Age of Onset  . Pneumonia Mother   . Diabetes Son   . Hypertension Son   . Heart disease Sister   . Hypertension Sister   . Diabetes Sister   . Hypertension Sister   . Diabetes Daughter     ROS:   Please see the history of present illness.  All other systems are reviewed and otherwise negative.    PHYSICAL EXAM:   VS:  BP 122/82   Pulse 80   Ht 5\' 1"  (1.549 m)   Wt 198 lb (89.8 kg)   BMI 37.41 kg/m   BMI: Body mass index is 37.41 kg/m. GEN: Well nourished, well developed lively WF, appears younger than stated age, in no acute distress  HEENT: normocephalic, atraumatic Neck: no JVD or masses Cardiac: RRR; 4/6 SEM at RUSB. S 2 present, No rubs or gallops, no edema, + varicose veins bilaterally Respiratory:  clear to auscultation  bilaterally, normal work of breathing GI: soft, nontender, nondistended, + BS MS: no deformity or atrophy  Skin: warm and dry, no rash, venous stasis changes of lower extremities Neuro:  Alert and Oriented x 3, Strength and sensation are intact, follows commands Psych: euthymic mood, full affect  Wt Readings from Last 3 Encounters:  08/04/17 198 lb (89.8 kg)  12/25/16 197 lb 6.4 oz (89.5 kg)  12/10/16 196 lb (88.9 kg)      Studies/Labs Reviewed:   EKG:  EKG was not ordered today.  Recent Labs: 09/12/2016: B Natriuretic Peptide 100.5; Magnesium 1.6 09/13/2016: ALT 13; TSH 2.062 09/20/2016: BUN 65; Creatinine, Ser 2.01; Hemoglobin 10.8; Platelets PLATELET CLUMPS NOTED ON SMEAR, COUNT APPEARS ADEQUATE; Potassium 5.3; Sodium 140   Lipid Panel    Component Value Date/Time   CHOL 141 02/02/2014 0355   TRIG 103 02/02/2014 0355   HDL 53 02/02/2014 0355   CHOLHDL 2.7 02/02/2014 0355   VLDL 21 02/02/2014 0355   LDLCALC 67 02/02/2014 0355   Additional studies/ records that were reviewed today include: Summarized above.   ASSESSMENT & PLAN:   1. Acute on chronic diastolic CHF - resolved. Appears euvolemic. Continue maintenance Lasix. Reviewed importance of sodium restriction, daily weights, observation for further symptoms. A 2. CKD stage III-IV - followed by Dr. Posey Pronto. Today 1.9. 3. Essential HTN - controlled. 4. Paroxysmal atrial fib - maintaining NSR by exam today. Asymptomatic. Not on anticoagulation as above. Moderate aortic stenosis - repeat echo today showed mean gradient 21 mmHg, consistent with moderate aortic stenosis. There is mid exertional dizziness with no syncope.   Disposition: F/u with Dr. Meda Coffee in 3 months.  Medication Adjustments/Labs and Tests Ordered: Current medicines are reviewed at length with the patient today.  Concerns regarding medicines are outlined above. Medication changes, Labs and Tests  ordered today are summarized above and listed in the Patient  Instructions accessible in Encounters.   Signed, Ena Dawley, MD 08/04/2017 8:38 AM    [address], [place], [place]  [address] Phone: [phone]; Fax: [phone]

## 2017-08-04 NOTE — Patient Instructions (Signed)
Medication Instructions:   Your physician recommends that you continue on your current medications as directed. Please refer to the Current Medication list given to you today.    Labwork:  TODAY--CMET, CBC W DIFF, TSH, AND PRO-BNP     Testing/Procedures:  Your physician has requested that you have an echocardiogram. Echocardiography is a painless test that uses sound waves to create images of your heart. It provides your doctor with information about the size and shape of your heart and how well your heart's chambers and valves are working. This procedure takes approximately one hour. There are no restrictions for this procedure.  YOU WILL HAVE YOUR ECHO DONE TODAY    Follow-Up:  3 MONTHS WITH DR Meda Coffee       If you need a refill on your cardiac medications before your next appointment, please call your pharmacy.

## 2017-08-05 ENCOUNTER — Telehealth: Payer: Self-pay | Admitting: *Deleted

## 2017-08-05 LAB — COMPREHENSIVE METABOLIC PANEL
ALT: 5 IU/L (ref 0–32)
AST: 13 IU/L (ref 0–40)
Albumin/Globulin Ratio: 2.5 — ABNORMAL HIGH (ref 1.2–2.2)
Albumin: 4.5 g/dL (ref 3.2–4.6)
Alkaline Phosphatase: 94 IU/L (ref 39–117)
BUN/Creatinine Ratio: 23 (ref 12–28)
BUN: 44 mg/dL — ABNORMAL HIGH (ref 10–36)
Bilirubin Total: 0.5 mg/dL (ref 0.0–1.2)
CO2: 22 mmol/L (ref 20–29)
Calcium: 8.6 mg/dL — ABNORMAL LOW (ref 8.7–10.3)
Chloride: 102 mmol/L (ref 96–106)
Creatinine, Ser: 1.93 mg/dL — ABNORMAL HIGH (ref 0.57–1.00)
GFR calc Af Amer: 26 mL/min/{1.73_m2} — ABNORMAL LOW (ref >59)
GFR calc non Af Amer: 22 mL/min/{1.73_m2} — ABNORMAL LOW (ref >59)
Globulin, Total: 1.8 g/dL (ref 1.5–4.5)
Glucose: 89 mg/dL (ref 65–99)
Potassium: 5.2 mmol/L (ref 3.5–5.2)
Sodium: 143 mmol/L (ref 134–144)
Total Protein: 6.3 g/dL (ref 6.0–8.5)

## 2017-08-05 LAB — CBC WITH DIFFERENTIAL/PLATELET
Basophils Absolute: 0 10*3/uL (ref 0.0–0.2)
Basos: 1 %
EOS (ABSOLUTE): 0.2 10*3/uL (ref 0.0–0.4)
Eos: 2 %
Hematocrit: 35.5 % (ref 34.0–46.6)
Hemoglobin: 11.1 g/dL (ref 11.1–15.9)
Immature Grans (Abs): 0 10*3/uL (ref 0.0–0.1)
Immature Granulocytes: 0 %
Lymphocytes Absolute: 0.8 10*3/uL (ref 0.7–3.1)
Lymphs: 13 %
MCH: 29.5 pg (ref 26.6–33.0)
MCHC: 31.3 g/dL — ABNORMAL LOW (ref 31.5–35.7)
MCV: 94 fL (ref 79–97)
Monocytes Absolute: 0.3 10*3/uL (ref 0.1–0.9)
Monocytes: 5 %
Neutrophils Absolute: 5 10*3/uL (ref 1.4–7.0)
Neutrophils: 79 %
Platelets: 216 10*3/uL (ref 150–379)
RBC: 3.76 x10E6/uL — ABNORMAL LOW (ref 3.77–5.28)
RDW: 15 % (ref 12.3–15.4)
WBC: 6.4 10*3/uL (ref 3.4–10.8)

## 2017-08-05 LAB — TSH: TSH: 2.49 u[IU]/mL (ref 0.450–4.500)

## 2017-08-05 LAB — PRO B NATRIURETIC PEPTIDE: NT-Pro BNP: 461 pg/mL (ref 0–738)

## 2017-08-05 NOTE — Telephone Encounter (Signed)
Patient informed and copy sent to PCP & nephrologist per patient request.

## 2017-08-05 NOTE — Telephone Encounter (Signed)
-----   Message from Dorothy Spark, MD sent at 08/05/2017  9:44 AM EDT ----- Stable kidney function, no signs of heart failure, stable mild anemia.

## 2017-08-08 ENCOUNTER — Telehealth: Payer: Self-pay | Admitting: Cardiology

## 2017-08-08 NOTE — Telephone Encounter (Signed)
Pt is calling because she received her echo report on her mychart account, from 10/15.  Pt states "none of this makes sense to me." Informed the pt that Dr Meda Coffee went over her echo results with her on 10/15, same day as she had the echo.  Reiterated Dr Francesca Oman interpretation of her echo from then.  Pt verbalized understanding.  ASSESSMENT & PLAN:   1. Acute on chronic diastolic CHF - resolved. Appears euvolemic. Continue maintenance Lasix. Reviewed importance of sodium restriction, daily weights, observation for further symptoms. A 2. CKD stage III-IV - followed by Dr. Posey Pronto. Today 1.9. 3. Essential HTN - controlled. 4. Paroxysmal atrial fib - maintaining NSR by exam today. Asymptomatic. Not on anticoagulation as above. Moderate aortic stenosis - repeat echo today showed mean gradient 21 mmHg, consistent with moderate aortic stenosis. There is mid exertional dizziness with no syncope.   Disposition: F/u with Dr. Meda Coffee in 3 months.  Medication Adjustments/Labs and Tests Ordered: Current medicines are reviewed at length with the patient today.  Concerns regarding medicines are outlined above. Medication changes, Labs and Tests ordered today are summarized above and listed in the Patient Instructions accessible in Encounters.   Signed, Ena Dawley, MD 08/04/2017 8:38 AM    [facility], [place], [place]  [address] Phone: [phone]; Fax: [phone]

## 2017-08-08 NOTE — Telephone Encounter (Signed)
New message  Pt call requesting to speak with RN about results. Please call back to discuss

## 2017-08-12 DIAGNOSIS — E7849 Other hyperlipidemia: Secondary | ICD-10-CM | POA: Diagnosis not present

## 2017-08-12 DIAGNOSIS — E1129 Type 2 diabetes mellitus with other diabetic kidney complication: Secondary | ICD-10-CM | POA: Diagnosis not present

## 2017-08-12 DIAGNOSIS — N184 Chronic kidney disease, stage 4 (severe): Secondary | ICD-10-CM | POA: Diagnosis not present

## 2017-08-12 DIAGNOSIS — E1142 Type 2 diabetes mellitus with diabetic polyneuropathy: Secondary | ICD-10-CM | POA: Diagnosis not present

## 2017-09-21 ENCOUNTER — Encounter (HOSPITAL_COMMUNITY): Payer: Self-pay | Admitting: *Deleted

## 2017-09-21 ENCOUNTER — Other Ambulatory Visit: Payer: Self-pay

## 2017-09-21 ENCOUNTER — Emergency Department (HOSPITAL_COMMUNITY)
Admission: EM | Admit: 2017-09-21 | Discharge: 2017-09-21 | Disposition: A | Discharge status: Home / Self Care | Payer: Medicare Other | Attending: Emergency Medicine | Admitting: Emergency Medicine

## 2017-09-21 DIAGNOSIS — Z87891 Personal history of nicotine dependence: Secondary | ICD-10-CM | POA: Diagnosis not present

## 2017-09-21 DIAGNOSIS — Y999 Unspecified external cause status: Secondary | ICD-10-CM | POA: Diagnosis not present

## 2017-09-21 DIAGNOSIS — S8992XA Unspecified injury of left lower leg, initial encounter: Secondary | ICD-10-CM | POA: Diagnosis present

## 2017-09-21 DIAGNOSIS — Z96652 Presence of left artificial knee joint: Secondary | ICD-10-CM | POA: Diagnosis not present

## 2017-09-21 DIAGNOSIS — S81812A Laceration without foreign body, left lower leg, initial encounter: Secondary | ICD-10-CM | POA: Diagnosis not present

## 2017-09-21 DIAGNOSIS — I130 Hypertensive heart and chronic kidney disease with heart failure and stage 1 through stage 4 chronic kidney disease, or unspecified chronic kidney disease: Secondary | ICD-10-CM | POA: Diagnosis not present

## 2017-09-21 DIAGNOSIS — Z23 Encounter for immunization: Secondary | ICD-10-CM | POA: Diagnosis not present

## 2017-09-21 DIAGNOSIS — Z794 Long term (current) use of insulin: Secondary | ICD-10-CM | POA: Diagnosis not present

## 2017-09-21 DIAGNOSIS — Y9389 Activity, other specified: Secondary | ICD-10-CM | POA: Insufficient documentation

## 2017-09-21 DIAGNOSIS — I5032 Chronic diastolic (congestive) heart failure: Secondary | ICD-10-CM | POA: Insufficient documentation

## 2017-09-21 DIAGNOSIS — J45909 Unspecified asthma, uncomplicated: Secondary | ICD-10-CM | POA: Insufficient documentation

## 2017-09-21 DIAGNOSIS — W228XXA Striking against or struck by other objects, initial encounter: Secondary | ICD-10-CM | POA: Diagnosis not present

## 2017-09-21 DIAGNOSIS — N183 Chronic kidney disease, stage 3 (moderate): Secondary | ICD-10-CM | POA: Insufficient documentation

## 2017-09-21 DIAGNOSIS — E1122 Type 2 diabetes mellitus with diabetic chronic kidney disease: Secondary | ICD-10-CM | POA: Diagnosis not present

## 2017-09-21 DIAGNOSIS — Y929 Unspecified place or not applicable: Secondary | ICD-10-CM | POA: Insufficient documentation

## 2017-09-21 MED ORDER — LIDOCAINE-EPINEPHRINE (PF) 2 %-1:200000 IJ SOLN
20.0000 mL | Freq: Once | INTRAMUSCULAR | Status: AC
  Administered 2017-09-21: 20 mL
  Filled 2017-09-21: qty 20

## 2017-09-21 MED ORDER — TETANUS-DIPHTH-ACELL PERTUSSIS 5-2.5-18.5 LF-MCG/0.5 IM SUSP
0.5000 mL | Freq: Once | INTRAMUSCULAR | Status: AC
  Administered 2017-09-21: 0.5 mL via INTRAMUSCULAR
  Filled 2017-09-21: qty 0.5

## 2017-09-21 NOTE — ED Provider Notes (Signed)
Emergency Department Provider Note   I have reviewed the triage vital signs and the nursing notes.   HISTORY  Chief Complaint Extremity Laceration   HPI Ann Sellers is a 83 y.o. female with PMH of anemia, CKD, CHF, PAF emergency department for evaluation after leg injury.  The patient was getting out of the car when the car door swung and struck her in the leg.  She had profuse bleeding from the area with some pain.  Towel was applied and she presented to the emergency department. No falls. No head trauma.   Past Medical History:  Diagnosis Date  . Anemia   . Arthritis   . Asthma   . B12 deficiency   . Chronic diastolic CHF (congestive heart failure) ([place])   . CKD (chronic kidney disease), stage III ([place])   . Diabetes mellitus ([place])   . Hypertension   . Moderate aortic stenosis   . Neuromuscular disorder ([place])   . PAF (paroxysmal atrial fibrillation) ([place])   . Right rotator cuff tear   . Wears dentures    top denture-bottom partial  . Wears glasses   . Wears hearing aid    both ears    Patient Active Problem List   Diagnosis Date Noted  . PAF (paroxysmal atrial fibrillation) ([place]) 12/23/2016  . Aortic stenosis, moderate 09/16/2016  . Community acquired pneumonia 09/13/2016  . Lower extremity weakness 09/13/2016  . Acute on chronic diastolic CHF (congestive heart failure) ([place]) 09/13/2016  . Severe persistent chronic asthma without complication 96/78/9381  . Morbid obesity ([place]) 07/06/2015  . Edema 07/22/2014  . Varicose veins of lower extremities with complications 01/75/1025  . CKD (chronic kidney disease), stage III ([place]) 02/01/2014  . Right rotator cuff tear   . Hypertension   . Arthritis   . Insulin dependent diabetes mellitus with complications ([place])   . Spinal stenosis   . Wears glasses   . Wears dentures   . Wears hearing aid   . Asthma 07/11/2011  . Vitamin D deficiency 07/11/2011  . Osteoarthritis 07/11/2011  . Cough 07/11/2011    Past  Surgical History:  Procedure Laterality Date  . APPENDECTOMY  1945  . CATARACT EXTRACTION, BILATERAL  2008  . cervical stynosis  2005  . esophageal stretch  2001  . [place]  . herniated disc  1983  . left hand surgery  2011   gout  . left knee replacement  2002  . left knee surgery  1998  . right wrist fracture  2006  . SHOULDER ARTHROSCOPY WITH ROTATOR CUFF REPAIR Right 01/05/2013   Procedure: SHOULDER ARTHROSCOPY WITH ROTATOR CUFF REPAIR subchromial decompression and distal clavical excision;  Surgeon: Lorn Junes, MD;  Location: [facility];  Service: Orthopedics;  Laterality: Right;  . tonsillectomy  1936  . TONSILLECTOMY    . TYMPANOSTOMY TUBE PLACEMENT  1989  . VESICOVAGINAL FISTULA CLOSURE W/ TAH  1968    Current Outpatient Rx  . Order #: [id number] Class: Historical Med  . Order #: [id number] Class: Historical Med  . Order #: [id number] Class: Historical Med  . Order #: [id number] Class: Historical Med  . Order #: [id number] Class: Historical Med  . Order #: [id number] Class: Historical Med  . Order #: [id number] Class: Normal  . Order #: [id number] Class: Historical Med  . Order #: [id number] Class: Normal  . Order #: [id number] Class: Historical Med  . Order #: [id number] Class: Historical Med  . Order #: [id number] Class: Historical Med  . Order #: [id number] Class: Historical  Med  . Order #: [id number] Class: Historical Med  . Order #: [id number] Class: Historical Med  . Order #: [id number] Class: Normal  . Order #: [id number] Class: Historical Med  . Order #: [id number] Class: Historical Med  . Order #: [id number] Class: Normal  . Order #: [id number] Class: Historical Med  . Order #: [id number] Class: Print  . Order #: [id number] Class: Historical Med  . Order #: [id number] Class: Historical Med    Allergies Losartan and Snake antivenin [antivenin crotalidae polyvalent (snake antivenin)]  Family History  Problem Relation Age of Onset  . Pneumonia Mother   .  Diabetes Son   . Hypertension Son   . Heart disease Sister   . Hypertension Sister   . Diabetes Sister   . Hypertension Sister   . Diabetes Daughter     Social History Social History   Tobacco Use  . Smoking status: Former Smoker    Packs/day: 0.50    Years: 20.00    Pack years: 10.00    Types: Cigarettes    Last attempt to quit: 10/22/1987    Years since quitting: 29.9  . Smokeless tobacco: Never Used  Substance Use Topics  . Alcohol use: Yes    Alcohol/week: 4.2 oz    Types: 7 Glasses of wine per week    Comment: social drinker  . Drug use: No    Review of Systems  Constitutional: No fever/chills Eyes: No visual changes. ENT: No sore throat. Cardiovascular: Denies chest pain. Respiratory: Denies shortness of breath. Gastrointestinal: No abdominal pain.  No nausea, no vomiting.  No diarrhea.  No constipation. Genitourinary: Negative for dysuria. Musculoskeletal: Negative for back pain. Skin: Negative for rash. Leg laceration.  Neurological: Negative for headaches, focal weakness or numbness.  10-point ROS otherwise negative.  ____________________________________________   PHYSICAL EXAM:  VITAL SIGNS: ED Triage Vitals  Enc Vitals Group     BP 09/21/17 1349 (!) 146/69     Pulse Rate 09/21/17 1349 81     Resp 09/21/17 1349 18     Temp 09/21/17 1349 98.6 F (37 C)     Temp Source 09/21/17 1349 Oral     SpO2 09/21/17 1349 99 %     Weight 09/21/17 1352 195 lb (88.5 kg)     Height 09/21/17 1352 4\' 11"  (1.499 m)     Pain Score 09/21/17 1348 4    Constitutional: Alert and oriented. Well appearing and in no acute distress. Eyes: Conjunctivae are normal.  Head: Atraumatic. Nose: No congestion/rhinnorhea. Mouth/Throat: Mucous membranes are moist.   Neck: No stridor.  Cardiovascular: Good peripheral circulation.  sounds.   Respiratory: Normal respiratory effort.  Gastrointestinal: No distention.  Musculoskeletal: No lower extremity tenderness nor edema. No  gross deformities of extremities. Neurologic:  Normal speech and language. No gross focal neurologic deficits are appreciated.  Skin:  Skin is warm, dry and intact. 6 cm "V" shaped laceration to the left lateral lower extremity. Steady venous oozing blood noted. No arterial injury. Entire depth of wound visualized without FB.   ____________________________________________   PROCEDURES  Procedure(s) performed:   Marland KitchenMarland KitchenLaceration Repair Date/Time: 09/21/2017 2:52 PM Performed by: Margette Fast, MD Authorized by: Margette Fast, MD   Consent:    Consent obtained:  Verbal   Consent given by:  Patient   Risks discussed:  Infection, need for additional repair, nerve damage, poor wound healing, poor cosmetic result, pain, retained foreign body, tendon damage and vascular damage Anesthesia (see MAR for exact dosages):    Anesthesia method:  Local infiltration  Local anesthetic:  Lidocaine 2% WITH epi Laceration details:    Location:  Leg   Leg location:  L lower leg   Length (cm):  6 Repair type:    Repair type:  Intermediate Pre-procedure details:    Preparation:  Patient was prepped and draped in usual sterile fashion Exploration:    Hemostasis achieved with:  Direct pressure and epinephrine   Wound exploration: wound explored through full range of motion and entire depth of wound probed and visualized     Wound extent: no foreign bodies/material noted, no muscle damage noted, no nerve damage noted and no tendon damage noted     Contaminated: no   Treatment:    Area cleansed with:  Betadine   Amount of cleaning:  Standard Skin repair:    Repair method:  Sutures   Suture size:  4-0   Suture material:  Prolene   Suture technique:  Simple interrupted   Number of sutures:  8 Approximation:    Approximation:  Loose   Vermilion border: well-aligned   Post-procedure details:    Dressing:  Bulky dressing   Patient tolerance of procedure:  Tolerated well, no immediate  complications     ____________________________________________   INITIAL IMPRESSION / ASSESSMENT AND PLAN / ED COURSE  Pertinent labs & imaging results that were available during my care of the patient were reviewed by me and considered in my medical decision making (see chart for details).  Patient presents to the emergency department for evaluation of laceration to the left lower extremity.  The laceration is superficial but the affected portion of scan appears to be well vascularized.  Patient's skin is thin but I do believe it will hold suture.  The patient had significant venous oozing blood which was stopped with direct pressure.  Patient is taking aspirin.  No arterial bleeding.  Laceration repaired as above with hemostasis achieved.  Bulky dressing applied to the leg.  Discussed wound care, dressing changes, suture removal timeframe in detail with the patient and family.  Discussed symptoms of anemia in detail. Patient believes that she had her tetanus shot in the last 10 years.   At this time, I do not feel there is any life-threatening condition present. I have reviewed and discussed all results (EKG, imaging, lab, urine as appropriate), exam findings with patient. I have reviewed nursing notes and appropriate previous records.  I feel the patient is safe to be discharged home without further emergent workup. Discussed usual and customary return precautions. Patient and family (if present) verbalize understanding and are comfortable with this plan.  Patient will follow-up with their primary care provider. If they do not have a primary care provider, information for follow-up has been provided to them. All questions have been answered.    ____________________________________________  FINAL CLINICAL IMPRESSION(S) / ED DIAGNOSES  Final diagnoses:  None     MEDICATIONS GIVEN DURING THIS VISIT:  Medications  lidocaine-EPINEPHrine (XYLOCAINE W/EPI) 2 %-1:200000 (PF) injection 20 mL  (not administered)     Note:  This document was prepared using Dragon voice recognition software and may include unintentional dictation errors.  Nanda Quinton, MD Emergency Medicine    Dajanae Brophy, [facility], MD 09/21/17 [phone]

## 2017-09-21 NOTE — ED Notes (Signed)
Dr. Laverta Baltimore suturing pt.

## 2017-09-21 NOTE — ED Triage Notes (Signed)
Pt arrived to ED with Leg lac. To Lt lower leg. Pt reported the corner of car door hit her leg. Pt had  Blood soaked  bath towel around Lt lower leg secured with a belt. After removing the towel  Pt had a large lac strong bleed . Dr Benay Pike to room to asses lac fo\r possible arterial bleed. Dsy applied with compression.  Pt reports she takes a 81 mg ASA  Daily.

## 2017-09-21 NOTE — Discharge Instructions (Signed)
You were seen in the ED today with laceration to the leg. We repaired this with stitches that will need to be removed in 7-10 days. If you experience any oozing blood from the wound apply direct pressure for 15 minutes and change the dressing if it stops.   If bleeding does not stop, you develop fever, or sudden worsening of pain in the leg you should return to the ED.

## 2017-09-23 DIAGNOSIS — Z5189 Encounter for other specified aftercare: Secondary | ICD-10-CM | POA: Diagnosis not present

## 2017-09-23 DIAGNOSIS — D6489 Other specified anemias: Secondary | ICD-10-CM | POA: Diagnosis not present

## 2017-10-03 DIAGNOSIS — Z6841 Body Mass Index (BMI) 40.0 and over, adult: Secondary | ICD-10-CM | POA: Diagnosis not present

## 2017-10-03 DIAGNOSIS — S81819A Laceration without foreign body, unspecified lower leg, initial encounter: Secondary | ICD-10-CM | POA: Diagnosis not present

## 2017-10-27 ENCOUNTER — Ambulatory Visit (INDEPENDENT_AMBULATORY_CARE_PROVIDER_SITE_OTHER): Payer: Self-pay

## 2017-10-27 ENCOUNTER — Ambulatory Visit (INDEPENDENT_AMBULATORY_CARE_PROVIDER_SITE_OTHER): Payer: Medicare Other | Admitting: Orthopaedic Surgery

## 2017-10-27 ENCOUNTER — Encounter (INDEPENDENT_AMBULATORY_CARE_PROVIDER_SITE_OTHER): Payer: Self-pay | Admitting: Orthopaedic Surgery

## 2017-10-27 VITALS — BP 179/68 | HR 80 | Resp 14 | Ht 60.0 in | Wt 195.0 lb

## 2017-10-27 DIAGNOSIS — M25512 Pain in left shoulder: Secondary | ICD-10-CM

## 2017-10-27 DIAGNOSIS — M25522 Pain in left elbow: Secondary | ICD-10-CM | POA: Diagnosis not present

## 2017-10-27 DIAGNOSIS — G8929 Other chronic pain: Secondary | ICD-10-CM | POA: Diagnosis not present

## 2017-10-27 MED ORDER — METHYLPREDNISOLONE ACETATE 40 MG/ML IJ SUSP
80.0000 mg | INTRAMUSCULAR | Status: AC | PRN
  Administered 2017-10-27: 80 mg

## 2017-10-27 MED ORDER — BUPIVACAINE HCL 0.5 % IJ SOLN
2.0000 mL | INTRAMUSCULAR | Status: AC | PRN
  Administered 2017-10-27: 2 mL via INTRA_ARTICULAR

## 2017-10-27 MED ORDER — LIDOCAINE HCL 2 % IJ SOLN
2.0000 mL | INTRAMUSCULAR | Status: AC | PRN
  Administered 2017-10-27: 2 mL

## 2017-10-27 NOTE — Progress Notes (Signed)
Office Visit Note   Patient: Ann Sellers           Date of Birth: November 05, 1924           MRN: [id number] Visit Date: 10/27/2017              Requested by: Reynold Bowen, MD [place], [place] [id number] PCP: Reynold Bowen, MD   Assessment & Plan: Visit Diagnoses:  1. Chronic left shoulder pain     Plan: Left shoulder pain could be a small rotator cuff tear. I will try a subacromial cortisone injection and monitor her response. She had immediate relief of her pain after the injection. I believe the pain in the elbow may be referred from the shoulder  Follow-Up Instructions: Return if symptoms worsen or fail to improve.   Orders:  Orders Placed This Encounter  Procedures  . Large Joint Inj: L subacromial bursa  . XR Elbow 2 Views Left  . XR Shoulder Left   No orders of the defined types were placed in this encounter.     Procedures: Large Joint Inj: L subacromial bursa on 10/27/2017 1:55 PM Indications: pain and diagnostic evaluation Details: 25 G 1.5 in needle, anterolateral approach  Arthrogram: No  Medications: 2 mL lidocaine 2 %; 2 mL bupivacaine 0.5 %; 80 mg methylPREDNISolone acetate 40 MG/ML Consent was given by the patient. Immediately prior to procedure a time out was called to verify the correct patient, procedure, equipment, support staff and site/side marked as required. Patient was prepped and draped in the usual sterile fashion.       Clinical Data: No additional findings.   Subjective: Chief Complaint  Patient presents with  . Left Shoulder - Pain, Edema    Ann Sellers is a 84 y o here today for Left shoulder/left elbow pain. She sleeps in a recliner, unable to lay on left shoulder,   . Left Elbow - Pain, Edema    Pain for years but now the pain is much worse.  Ann Sellers has been experiencing insidious onset of left shoulder pain and left elbow pain over period of months. No injury or trauma. Does use a rolling walker as an ambulatory  aid and for balance. She's had some difficulty raising her arm over her head. She's not sure if the elbow pain is related to the shoulder is referred from the shoulder. No redness or erythema. No numbness or tingling. No hand pain.  HPI  Review of Systems  Constitutional: Negative for chills, fatigue and fever.  HENT: Positive for hearing loss.   Eyes: Negative for itching.  Respiratory: Negative for chest tightness and shortness of breath.   Cardiovascular: Negative for chest pain, palpitations and leg swelling.  Gastrointestinal: Negative for blood in stool, constipation and diarrhea.  Endocrine: Negative for polyuria.  Genitourinary: Negative for dysuria.  Musculoskeletal: Positive for back pain, joint swelling and neck stiffness. Negative for neck pain.  Allergic/Immunologic: Negative for immunocompromised state.  Neurological: Negative for dizziness and numbness.  Hematological: Does not bruise/bleed easily.  Psychiatric/Behavioral: The patient is not nervous/anxious.      Objective: Vital Signs: BP (!) 179/68   Pulse 80   Resp 14   Ht 5' (1.524 m)   Wt 195 lb (88.5 kg)   BMI 38.08 kg/m   Physical Exam  Ortho Exam awake alert and oriented 3 comfortable sitting. Positive impingement syndrome left shoulder. Him difficulty raising her arm overhead related to pain. Passively I can raise  to just about fully overhead. Good strength with internal and external rotation. Minimally positive empty can test. Biceps appears to be intact. Some mild tenderness along the anterior subacromial region. No pain with range of motion of her shoulder. Good grip and good release  Specialty Comments:  No specialty comments available.  Imaging: Xr Elbow 2 Views Left  Result Date: 10/27/2017 Films of the left elbow obtained 2 projections. There is no ectopic calcification. Joint spaces are well maintained. No evidence of a fracture.. A fat pad sign is negative. Radial head intact. Small area of  calcification along the distal triceps insertion on the olecranon with some bony prominence of the olecranon. That area is asymptomatic.  Xr Shoulder Left  Result Date: 10/27/2017 Films of the left shoulder pain several projections. There is an old fusion of the cervical spine. There is some osteopenia. The humeral head is centered about the glenoid. No ectopic calcification. Some prominence of the greater tuberosity which might create impingement. Otherwise normal space between the humeral head and acromion.    PMFS History: Patient Active Problem List   Diagnosis Date Noted  . PAF (paroxysmal atrial fibrillation) ([place]) 12/23/2016  . Aortic stenosis, moderate 09/16/2016  . Community acquired pneumonia 09/13/2016  . Lower extremity weakness 09/13/2016  . Acute on chronic diastolic CHF (congestive heart failure) ([place]) 09/13/2016  . Severe persistent chronic asthma without complication 99/24/2683  . Morbid obesity ([place]) 07/06/2015  . Edema 07/22/2014  . Varicose veins of lower extremities with complications 41/96/2229  . CKD (chronic kidney disease), stage III ([place]) 02/01/2014  . Right rotator cuff tear   . Hypertension   . Arthritis   . Insulin dependent diabetes mellitus with complications ([place])   . Spinal stenosis   . Wears glasses   . Wears dentures   . Wears hearing aid   . Asthma 07/11/2011  . Vitamin D deficiency 07/11/2011  . Osteoarthritis 07/11/2011  . Cough 07/11/2011   Past Medical History:  Diagnosis Date  . Anemia   . Arthritis   . Asthma   . B12 deficiency   . Chronic diastolic CHF (congestive heart failure) ([place])   . CKD (chronic kidney disease), stage III ([place])   . Diabetes mellitus ([place])   . Hypertension   . Moderate aortic stenosis   . Neuromuscular disorder ([place])   . PAF (paroxysmal atrial fibrillation) ([place])   . Right rotator cuff tear   . Wears dentures    top denture-bottom partial  . Wears glasses   . Wears hearing aid    both ears    Family  History  Problem Relation Age of Onset  . Pneumonia Mother   . Diabetes Son   . Hypertension Son   . Heart disease Sister   . Hypertension Sister   . Diabetes Sister   . Hypertension Sister   . Diabetes Daughter     Past Surgical History:  Procedure Laterality Date  . APPENDECTOMY  1945  . CATARACT EXTRACTION, BILATERAL  2008  . cervical stynosis  2005  . esophageal stretch  2001  . [place]  . herniated disc  1983  . left hand surgery  2011   gout  . left knee replacement  2002  . left knee surgery  1998  . right wrist fracture  2006  . SHOULDER ARTHROSCOPY WITH ROTATOR CUFF REPAIR Right 01/05/2013   Procedure: SHOULDER ARTHROSCOPY WITH ROTATOR CUFF REPAIR subchromial decompression and distal clavical excision;  Surgeon: Audree Camel  Noemi Chapel, MD;  Location: [facility];  Service: Orthopedics;  Laterality: Right;  . tonsillectomy  1936  . TONSILLECTOMY    . TYMPANOSTOMY TUBE PLACEMENT  1989  . VESICOVAGINAL FISTULA CLOSURE W/ TAH  1968   Social History   Occupational History  . Occupation: homemaker  Tobacco Use  . Smoking status: Former Smoker    Packs/day: 0.50    Years: 20.00    Pack years: 10.00    Types: Cigarettes    Last attempt to quit: 10/22/1987    Years since quitting: 30.0  . Smokeless tobacco: Never Used  Substance and Sexual Activity  . Alcohol use: Yes    Alcohol/week: 4.2 oz    Types: 7 Glasses of wine per week    Comment: social drinker  . Drug use: No  . Sexual activity: Not on file

## 2017-11-05 ENCOUNTER — Encounter: Payer: Self-pay | Admitting: Cardiology

## 2017-11-05 ENCOUNTER — Ambulatory Visit: Payer: Medicare Other | Admitting: Cardiology

## 2017-11-05 VITALS — BP 138/52 | HR 65 | Ht 60.0 in | Wt 195.8 lb

## 2017-11-05 DIAGNOSIS — N183 Chronic kidney disease, stage 3 unspecified: Secondary | ICD-10-CM

## 2017-11-05 DIAGNOSIS — I5032 Chronic diastolic (congestive) heart failure: Secondary | ICD-10-CM | POA: Diagnosis not present

## 2017-11-05 DIAGNOSIS — R55 Syncope and collapse: Secondary | ICD-10-CM | POA: Diagnosis not present

## 2017-11-05 DIAGNOSIS — I350 Nonrheumatic aortic (valve) stenosis: Secondary | ICD-10-CM

## 2017-11-05 NOTE — Patient Instructions (Signed)
Medication Instructions:   Your physician recommends that you continue on your current medications as directed. Please refer to the Current Medication list given to you today.    Labwork:  TODAY--CMET, CBC W DIFF, MAGNESIUM, TSH, PRO-BNP, LIPIDS, AND VITAMIN D LEVEL    Testing/Procedures:  Your physician has requested that you have an echocardiogram. Echocardiography is a painless test that uses sound waves to create images of your heart. It provides your doctor with information about the size and shape of your heart and how well your heart's chambers and valves are working. This procedure takes approximately one hour. There are no restrictions for this procedure.    Follow-Up:  3 MONTHS WITH DR Meda Coffee       If you need a refill on your cardiac medications before your next appointment, please call your pharmacy.

## 2017-11-05 NOTE — Progress Notes (Signed)
Cardiology Office Note    Date:  11/05/2017  ID:  AYDE RECORD, DOB 10-25-1924, MRN [id number] PCP:  Reynold Bowen, MD  Cardiologist:  Dr. Meda Coffee Nephrologist:  Dr. Posey Pronto   Chief Complaint: f/u syncope  History of Present Illness:  Ann Sellers is a 84 y.o. female (appearing younger than stated age) with history of CKD III, DM, HTN, anemia, B12 deficiency, asthma, aortic stenosis, chronic diastolic CHF who presents for f/u of CHF. Per rview of chart, she had elevated troponin of 0.25 while in the hospital in November 2017 during an episode of pneumonia and acute on chronic kidney insufficiency. This was felt to be due to demand ischemia. She also had paroxysms of atrial fib felt possibly due to her acute illness. She was not anticoagulated secondary to a history of recurrent epistaxis and anemia. 2D Echo 09/14/16: mild LVH, EF 65-70%, grade 1 DD, moderate aortic stenosis, mild MR/TR. She reports she was bit by a snake around the ankle 50 years ago and that leg has always had a poor circulation but she doesn't have claudication. She saw Dr. Meda Coffee 12/10/16 at which time she was reporting worsening SOB and mild edema. She had recently been treated for COPD exacerbation with oral steroids. Dr. Meda Coffee recommended increasing Lasix ot 40mg  BID x 6 days then decrease back to 40mg  daily. BMET 12/06/16 showed Cr 1.7, Na 144, K 4.4. Last CBC in 09/2016 showed Hgb 10.8 which was stable for her.  12/25/2016 - She presents back to clinic today feeling great. Her breathing is back to baseline and edema has resolved. She's back to doing Lasix 40mg  daily. She just celebrated her 89th birthday. She had a big party at church with [employer]. She does not weigh herself every day but states she is back to her baseline weight. Her weight in 08/2017 was listed as 189 but every other recent weight was 198-201 recently. She's 297 today. She's not had any chest pain or dizziness.  11/05/17 - the patient is doing well, she  continues to walk with a walker. Denies any side effects from her medications. She had occasions where she had her leg with a car door and developed significant bleeding for which she is taking iron pills. She also had 2 episodes of dizziness and syncope last week. There were no prodromal syndrome such as chest pain or palpitations. It was witnessed and she recovered in few seconds. She denies any chest pain.  Past Medical History:  Diagnosis Date  . Anemia   . Arthritis   . Asthma   . B12 deficiency   . Chronic diastolic CHF (congestive heart failure) ([place])   . CKD (chronic kidney disease), stage III ([place])   . Diabetes mellitus ([place])   . Hypertension   . Moderate aortic stenosis   . Neuromuscular disorder ([place])   . PAF (paroxysmal atrial fibrillation) ([place])   . Right rotator cuff tear   . Wears dentures    top denture-bottom partial  . Wears glasses   . Wears hearing aid    both ears    Past Surgical History:  Procedure Laterality Date  . APPENDECTOMY  1945  . CATARACT EXTRACTION, BILATERAL  2008  . cervical stynosis  2005  . esophageal stretch  2001  . [place]  . herniated disc  1983  . left hand surgery  2011   gout  . left knee replacement  2002  . left knee surgery  1998  .  right wrist fracture  2006  . SHOULDER ARTHROSCOPY WITH ROTATOR CUFF REPAIR Right 01/05/2013   Procedure: SHOULDER ARTHROSCOPY WITH ROTATOR CUFF REPAIR subchromial decompression and distal clavical excision;  Surgeon: Lorn Junes, MD;  Location: [facility];  Service: Orthopedics;  Laterality: Right;  . tonsillectomy  1936  . TONSILLECTOMY    . TYMPANOSTOMY TUBE PLACEMENT  1989  . VESICOVAGINAL FISTULA CLOSURE W/ TAH  1968    Current Medications: Current Outpatient Medications  Medication Sig Dispense Refill  . albuterol (PROVENTIL HFA;VENTOLIN HFA) 108 (90 BASE) MCG/ACT inhaler Inhale 2 puffs into the lungs every 6 (six) hours as needed for wheezing or  shortness of breath.     Marland Kitchen albuterol (PROVENTIL) (2.5 MG/3ML) 0.083% nebulizer solution Take 2.5 mg by nebulization every 6 (six) hours as needed for wheezing or shortness of breath.    . allopurinol (ZYLOPRIM) 100 MG tablet Take 100 mg by mouth daily.      . calcitRIOL (ROCALTROL) 0.25 MCG capsule Take 1 capsule by mouth daily.  1  . diclofenac sodium (VOLTAREN) 1 % GEL Apply 2 g topically daily as needed (pain).    Marland Kitchen diphenoxylate-atropine (LOMOTIL) 2.5-0.025 MG per tablet Take 1 tablet by mouth daily as needed for diarrhea or loose stools.    . fexofenadine (ALLEGRA) 180 MG tablet Take 1 tablet (180 mg total) by mouth daily. 90 tablet 1  . fluticasone (FLONASE) 50 MCG/ACT nasal spray Place 2 sprays into both nostrils daily.  9  . furosemide (LASIX) 40 MG tablet Take 40 mg by mouth two times daily until Sunday 12/15/16, then take 40 mg by mouth daily thereafter. 96 tablet 1  . gabapentin (NEURONTIN) 300 MG capsule Take 300 mg by mouth 3 (three) times daily.    . GNP ASPIRIN LOW DOSE 81 MG EC tablet Take 81 mg by mouth daily.  3  . HYDROcodone-acetaminophen (NORCO/VICODIN) 5-325 MG per tablet Take 0.5-1 tablets by mouth every 6 (six) hours as needed for moderate pain.   0  . insulin glargine (LANTUS) 100 UNIT/ML injection Inject 20 Units into the skin at bedtime.     Marland Kitchen JANUVIA 50 MG tablet Take 50 mg by mouth daily.  2  . metoprolol succinate (TOPROL-XL) 25 MG 24 hr tablet Take 25 mg by mouth daily.  0  . mometasone-formoterol (DULERA) 100-5 MCG/ACT AERO Take 2 puffs first thing in am and then another 2 puffs about 12 hours later. (Patient taking differently: Inhale 2 puffs into the lungs 2 (two) times daily as needed for wheezing or shortness of breath. ) 1 Inhaler 11  . montelukast (SINGULAIR) 10 MG tablet Take 10 mg by mouth at bedtime.    . Multiple Vitamins-Minerals (ICAPS MV) TABS Take 1 tablet by mouth 2 (two) times daily. Reported on 11/20/2015    . omeprazole (PRILOSEC) 40 MG capsule Take  30-60 min before first meal of the day (Patient taking differently: Take 40 mg by mouth 2 (two) times daily. Take 40 mg by mouth 30-60 minutes before first meal of the day) 30 capsule 11  . POLY-IRON 150 150 MG capsule Take 150 mg by mouth daily.  0  . predniSONE (DELTASONE) 20 MG tablet Take 20 mg by mouth daily as needed (wheezing, shortness of breath). STANDING ORDER    . Respiratory Therapy Supplies (FLUTTER) DEVI Use as directed 1 each 0  . tiZANidine (ZANAFLEX) 4 MG tablet Take 4 mg by mouth every 8 (eight) hours as needed for muscle spasms.     Marland Kitchen  Vitamin D, Ergocalciferol, (DRISDOL) 50000 units CAPS capsule Take 50,000 Units by mouth once a week.  0   No current facility-administered medications for this visit.      Allergies:   Losartan and Snake antivenin [antivenin crotalidae polyvalent (snake antivenin)]   Social History   Socioeconomic History  . Marital status: Widowed    Spouse name: None  . Number of children: 6  . Years of education: None  . Highest education level: None  Social Needs  . Financial resource strain: None  . Food insecurity - worry: None  . Food insecurity - inability: None  . Transportation needs - medical: None  . Transportation needs - non-medical: None  Occupational History  . Occupation: homemaker  Tobacco Use  . Smoking status: Former Smoker    Packs/day: 0.50    Years: 20.00    Pack years: 10.00    Types: Cigarettes    Last attempt to quit: 10/22/1987    Years since quitting: 30.0  . Smokeless tobacco: Never Used  Substance and Sexual Activity  . Alcohol use: Yes    Alcohol/week: 4.2 oz    Types: 7 Glasses of wine per week    Comment: social drinker  . Drug use: No  . Sexual activity: None  Other Topics Concern  . None  Social History Narrative  . None     Family History:  Family History  Problem Relation Age of Onset  . Pneumonia Mother   . Diabetes Son   . Hypertension Son   . Heart disease Sister   . Hypertension Sister     . Diabetes Sister   . Hypertension Sister   . Diabetes Daughter     ROS:   Please see the history of present illness.  All other systems are reviewed and otherwise negative.    PHYSICAL EXAM:   VS:  BP (!) 138/52   Pulse 65   Ht 5' (1.524 m)   Wt 195 lb 12.8 oz (88.8 kg)   BMI 38.24 kg/m   BMI: Body mass index is 38.24 kg/m. GEN: Well nourished, well developed lively WF, appears younger than stated age, in no acute distress  HEENT: normocephalic, atraumatic Neck: no JVD or masses Cardiac: RRR; 4/6 SEM at RUSB. S 2 absent, No rubs or gallops, no edema, + varicose veins bilaterally Respiratory:  clear to auscultation bilaterally, normal work of breathing GI: soft, nontender, nondistended, + BS MS: no deformity or atrophy  Skin: warm and dry, no rash, venous stasis changes of lower extremities Neuro:  Alert and Oriented x 3, Strength and sensation are intact, follows commands Psych: euthymic mood, full affect  Wt Readings from Last 3 Encounters:  11/05/17 195 lb 12.8 oz (88.8 kg)  10/27/17 195 lb (88.5 kg)  09/21/17 195 lb (88.5 kg)      Studies/Labs Reviewed:   EKG:  EKG was ordered today 11/05/17 and it showed normal sinus rhythm with left axis deviation and right bundle branch block, unchanged from prior. This was personally reviewed.  Recent Labs: 08/04/2017: ALT 5; BUN 44; Creatinine, Ser 1.93; Hemoglobin 11.1; NT-Pro BNP 461; Platelets 216; Potassium 5.2; Sodium 143; TSH 2.490   Lipid Panel    Component Value Date/Time   CHOL 141 02/02/2014 0355   TRIG 103 02/02/2014 0355   HDL 53 02/02/2014 0355   CHOLHDL 2.7 02/02/2014 0355   VLDL 21 02/02/2014 0355   LDLCALC 67 02/02/2014 0355   Additional studies/ records that were reviewed today include: Summarized  above.   ASSESSMENT & PLAN:   1. Acute on chronic diastolic CHF - resolved. Appears euvolemic. Continue maintenance Lasix. Reviewed importance of sodium restriction, daily weights, observation for further  symptoms. A 2. CKD stage III-IV - followed by Dr. Posey Pronto. Last 1.9. 3. Essential HTN - controlled. 4. Paroxysmal atrial fib - maintaining NSR by exam today. Asymptomatic. Not on anticoagulation as above. 5. Syncope, h/o moderate echocardiogram with mean gradient 21 mmHg in 07/2017, there is no S2 present on physical exam, we will repeat an echocardiogram now. 6. Obtain CBC, CMP, vitamin D, BNP, TSH today. Moderate aortic stenosis - repeat echo today showed mean gradient 21 mmHg, repeat echo now.  Disposition: F/u with Dr. Meda Coffee in 3 months.  Medication Adjustments/Labs and Tests Ordered: Current medicines are reviewed at length with the patient today.  Concerns regarding medicines are outlined above. Medication changes, Labs and Tests ordered today are summarized above and listed in the Patient Instructions accessible in Encounters.   Signed, Ena Dawley, MD 11/05/2017 10:29 AM    [address], [place], [place]  [address] Phone: [phone]; Fax: [phone]

## 2017-11-06 LAB — COMPREHENSIVE METABOLIC PANEL
ALT: 10 IU/L (ref 0–32)
AST: 12 IU/L (ref 0–40)
Albumin/Globulin Ratio: 2.2 (ref 1.2–2.2)
Albumin: 4.4 g/dL (ref 3.2–4.6)
Alkaline Phosphatase: 113 IU/L (ref 39–117)
BUN/Creatinine Ratio: 23 (ref 12–28)
BUN: 45 mg/dL — ABNORMAL HIGH (ref 10–36)
Bilirubin Total: 0.3 mg/dL (ref 0.0–1.2)
CO2: 25 mmol/L (ref 20–29)
Calcium: 7.9 mg/dL — ABNORMAL LOW (ref 8.7–10.3)
Chloride: 102 mmol/L (ref 96–106)
Creatinine, Ser: 1.94 mg/dL — ABNORMAL HIGH (ref 0.57–1.00)
GFR calc Af Amer: 26 mL/min/{1.73_m2} — ABNORMAL LOW (ref >59)
GFR calc non Af Amer: 22 mL/min/{1.73_m2} — ABNORMAL LOW (ref >59)
Globulin, Total: 2 g/dL (ref 1.5–4.5)
Glucose: 64 mg/dL — ABNORMAL LOW (ref 65–99)
Potassium: 4.7 mmol/L (ref 3.5–5.2)
Sodium: 146 mmol/L — ABNORMAL HIGH (ref 134–144)
Total Protein: 6.4 g/dL (ref 6.0–8.5)

## 2017-11-06 LAB — LIPID PANEL
Chol/HDL Ratio: 2.8 ratio (ref 0.0–4.4)
Cholesterol, Total: 162 mg/dL (ref 100–199)
HDL: 58 mg/dL (ref >39)
LDL Calculated: 80 mg/dL (ref 0–99)
Triglycerides: 118 mg/dL (ref 0–149)
VLDL Cholesterol Cal: 24 mg/dL (ref 5–40)

## 2017-11-06 LAB — CBC WITH DIFFERENTIAL/PLATELET
Basophils Absolute: 0 10*3/uL (ref 0.0–0.2)
Basos: 0 %
EOS (ABSOLUTE): 0.1 10*3/uL (ref 0.0–0.4)
Eos: 2 %
Hematocrit: 34.9 % (ref 34.0–46.6)
Hemoglobin: 11 g/dL — ABNORMAL LOW (ref 11.1–15.9)
Immature Grans (Abs): 0 10*3/uL (ref 0.0–0.1)
Immature Granulocytes: 0 %
Lymphocytes Absolute: 1.1 10*3/uL (ref 0.7–3.1)
Lymphs: 14 %
MCH: 30.2 pg (ref 26.6–33.0)
MCHC: 31.5 g/dL (ref 31.5–35.7)
MCV: 96 fL (ref 79–97)
Monocytes Absolute: 0.5 10*3/uL (ref 0.1–0.9)
Monocytes: 6 %
Neutrophils Absolute: 6.2 10*3/uL (ref 1.4–7.0)
Neutrophils: 78 %
Platelets: 294 10*3/uL (ref 150–379)
RBC: 3.64 x10E6/uL — ABNORMAL LOW (ref 3.77–5.28)
RDW: 15.4 % (ref 12.3–15.4)
WBC: 8 10*3/uL (ref 3.4–10.8)

## 2017-11-06 LAB — VITAMIN D 25 HYDROXY (VIT D DEFICIENCY, FRACTURES): Vit D, 25-Hydroxy: 41.7 ng/mL (ref 30.0–100.0)

## 2017-11-06 LAB — PRO B NATRIURETIC PEPTIDE: NT-Pro BNP: 404 pg/mL (ref 0–738)

## 2017-11-06 LAB — MAGNESIUM: Magnesium: 1.9 mg/dL (ref 1.6–2.3)

## 2017-11-06 LAB — TSH: TSH: 1.94 u[IU]/mL (ref 0.450–4.500)

## 2017-11-17 ENCOUNTER — Encounter (INDEPENDENT_AMBULATORY_CARE_PROVIDER_SITE_OTHER): Payer: Self-pay | Admitting: Orthopaedic Surgery

## 2017-11-17 ENCOUNTER — Ambulatory Visit (INDEPENDENT_AMBULATORY_CARE_PROVIDER_SITE_OTHER): Payer: Medicare Other | Admitting: Orthopaedic Surgery

## 2017-11-17 VITALS — BP 182/79 | HR 61 | Resp 14 | Ht 62.0 in | Wt 200.0 lb

## 2017-11-17 DIAGNOSIS — G8929 Other chronic pain: Secondary | ICD-10-CM | POA: Diagnosis not present

## 2017-11-17 DIAGNOSIS — M25512 Pain in left shoulder: Secondary | ICD-10-CM | POA: Diagnosis not present

## 2017-11-17 NOTE — Progress Notes (Signed)
Office Visit Note   Patient: Ann Sellers           Date of Birth: 1925-03-22           MRN: [id number] Visit Date: 11/17/2017              Requested by: Reynold Bowen, MD [place], [place] [id number] PCP: Reynold Bowen, MD   Assessment & Plan: Visit Diagnoses:  1. Chronic left shoulder pain     Plan: Impingement syndrome left shoulder with possible rotator cuff tear. Much improved with the cortisone injection. Has had prior physical therapy. We will continue with home exercise program specifically for her left shoulder. We'll see back as needed  Follow-Up Instructions: Return if symptoms worsen or fail to improve.   Orders:  No orders of the defined types were placed in this encounter.  No orders of the defined types were placed in this encounter.     Procedures: No procedures performed   Clinical Data: No additional findings.   Subjective: Chief Complaint  Patient presents with  . Left Elbow - Pain, Edema  Seen several weeks ago for evaluation of left shoulder pain. Possible rotator cuff tear injected the subacromial space with cortisone with excellent relief of pain. She has had prior physical therapy and has appropriate instructions and excises at home. Continues to use a rolling walker as she has an issue with her balance and falls frequently  Review of Systems   Objective: Vital Signs: BP (!) 182/79   Pulse 61   Resp 14   Ht 5\' 2"  (1.575 m)   Wt 200 lb (90.7 kg)   BMI 36.58 kg/m   Physical Exam  Ortho Exam awake alert and oriented 3. Always pleasant. Full quick overhead motion of left shoulder. Negative impingement. Negative empty can test. Much better strength. Good grip and release.  Specialty Comments:  No specialty comments available.  Imaging: No results found.   PMFS History: Patient Active Problem List   Diagnosis Date Noted  . PAF (paroxysmal atrial fibrillation) ([place]) 12/23/2016  . Aortic stenosis, moderate  09/16/2016  . Community acquired pneumonia 09/13/2016  . Lower extremity weakness 09/13/2016  . Acute on chronic diastolic CHF (congestive heart failure) ([place]) 09/13/2016  . Severe persistent chronic asthma without complication 95/06/3266  . Morbid obesity ([place]) 07/06/2015  . Edema 07/22/2014  . Varicose veins of lower extremities with complications 12/45/8099  . CKD (chronic kidney disease), stage III ([place]) 02/01/2014  . Right rotator cuff tear   . Hypertension   . Arthritis   . Insulin dependent diabetes mellitus with complications ([place])   . Spinal stenosis   . Wears glasses   . Wears dentures   . Wears hearing aid   . Asthma 07/11/2011  . Vitamin D deficiency 07/11/2011  . Osteoarthritis 07/11/2011  . Cough 07/11/2011   Past Medical History:  Diagnosis Date  . Anemia   . Arthritis   . Asthma   . B12 deficiency   . Chronic diastolic CHF (congestive heart failure) ([place])   . CKD (chronic kidney disease), stage III ([place])   . Diabetes mellitus ([place])   . Hypertension   . Moderate aortic stenosis   . Neuromuscular disorder ([place])   . PAF (paroxysmal atrial fibrillation) ([place])   . Right rotator cuff tear   . Wears dentures    top denture-bottom partial  . Wears glasses   . Wears hearing aid    both ears    Family  History  Problem Relation Age of Onset  . Pneumonia Mother   . Diabetes Son   . Hypertension Son   . Heart disease Sister   . Hypertension Sister   . Diabetes Sister   . Hypertension Sister   . Diabetes Daughter     Past Surgical History:  Procedure Laterality Date  . APPENDECTOMY  1945  . CATARACT EXTRACTION, BILATERAL  2008  . cervical stynosis  2005  . esophageal stretch  2001  . [place]  . herniated disc  1983  . left hand surgery  2011   gout  . left knee replacement  2002  . left knee surgery  1998  . right wrist fracture  2006  . SHOULDER ARTHROSCOPY WITH ROTATOR CUFF REPAIR Right 01/05/2013   Procedure: SHOULDER  ARTHROSCOPY WITH ROTATOR CUFF REPAIR subchromial decompression and distal clavical excision;  Surgeon: Lorn Junes, MD;  Location: [facility];  Service: Orthopedics;  Laterality: Right;  . tonsillectomy  1936  . TONSILLECTOMY    . TYMPANOSTOMY TUBE PLACEMENT  1989  . VESICOVAGINAL FISTULA CLOSURE W/ TAH  1968   Social History   Occupational History  . Occupation: homemaker  Tobacco Use  . Smoking status: Former Smoker    Packs/day: 0.50    Years: 20.00    Pack years: 10.00    Types: Cigarettes    Last attempt to quit: 10/22/1987    Years since quitting: 30.0  . Smokeless tobacco: Never Used  Substance and Sexual Activity  . Alcohol use: Yes    Alcohol/week: 4.2 oz    Types: 7 Glasses of wine per week    Comment: social drinker  . Drug use: No  . Sexual activity: Not on file     Garald Balding, MD   Note - This record has been created using [employer].  Chart creation errors have been sought, but may not always  have been located. Such creation errors do not reflect on  the standard of medical care.

## 2017-11-17 NOTE — Progress Notes (Deleted)
Office Visit Note   Patient: Ann Sellers           Date of Birth: 05-Oct-1925           MRN: [id number] Visit Date: 11/17/2017              Requested by: Reynold Bowen, MD [place], [place] [address] PCP: Reynold Bowen, MD   Assessment & Plan: Visit Diagnoses: No diagnosis found.  Plan: ***  Follow-Up Instructions: No Follow-up on file.   Orders:  No orders of the defined types were placed in this encounter.  No orders of the defined types were placed in this encounter.     Procedures: No procedures performed   Clinical Data: No additional findings.   Subjective: Chief Complaint  Patient presents with  . Left Shoulder - Pain    HPI  Review of Systems  Constitutional: Negative for chills, fatigue and fever.  Eyes: Negative for itching.  Respiratory: Negative for chest tightness and shortness of breath.   Cardiovascular: Negative for chest pain, palpitations and leg swelling.  Gastrointestinal: Positive for diarrhea. Negative for blood in stool and constipation.  Endocrine: Negative for polyuria.  Genitourinary: Negative for dysuria.  Musculoskeletal: Negative for back pain, joint swelling, neck pain and neck stiffness.  Allergic/Immunologic: Negative for immunocompromised state.  Neurological: Positive for numbness. Negative for dizziness.  Hematological: Does not bruise/bleed easily.  Psychiatric/Behavioral: Positive for suicidal ideas. The patient is not nervous/anxious.      Objective: Vital Signs: Resp 14   Ht 5\' 2"  (1.575 m)   Wt 200 lb (90.7 kg)   BMI 36.58 kg/m   Physical Exam  Ortho Exam  Specialty Comments:  No specialty comments available.  Imaging: No results found.   PMFS History: Patient Active Problem List   Diagnosis Date Noted  . PAF (paroxysmal atrial fibrillation) ([place]) 12/23/2016  . Aortic stenosis, moderate 09/16/2016  . Community acquired pneumonia 09/13/2016  . Lower extremity weakness 09/13/2016    . Acute on chronic diastolic CHF (congestive heart failure) ([place]) 09/13/2016  . Severe persistent chronic asthma without complication 91/63/8466  . Morbid obesity ([place]) 07/06/2015  . Edema 07/22/2014  . Varicose veins of lower extremities with complications 59/93/5701  . CKD (chronic kidney disease), stage III ([place]) 02/01/2014  . Right rotator cuff tear   . Hypertension   . Arthritis   . Insulin dependent diabetes mellitus with complications ([place])   . Spinal stenosis   . Wears glasses   . Wears dentures   . Wears hearing aid   . Asthma 07/11/2011  . Vitamin D deficiency 07/11/2011  . Osteoarthritis 07/11/2011  . Cough 07/11/2011   Past Medical History:  Diagnosis Date  . Anemia   . Arthritis   . Asthma   . B12 deficiency   . Chronic diastolic CHF (congestive heart failure) ([place])   . CKD (chronic kidney disease), stage III ([place])   . Diabetes mellitus ([place])   . Hypertension   . Moderate aortic stenosis   . Neuromuscular disorder ([place])   . PAF (paroxysmal atrial fibrillation) ([place])   . Right rotator cuff tear   . Wears dentures    top denture-bottom partial  . Wears glasses   . Wears hearing aid    both ears    Family History  Problem Relation Age of Onset  . Pneumonia Mother   . Diabetes Son   . Hypertension Son   . Heart disease Sister   . Hypertension Sister   .  Diabetes Sister   . Hypertension Sister   . Diabetes Daughter     Past Surgical History:  Procedure Laterality Date  . APPENDECTOMY  1945  . CATARACT EXTRACTION, BILATERAL  2008  . cervical stynosis  2005  . esophageal stretch  2001  . [place]  . herniated disc  1983  . left hand surgery  2011   gout  . left knee replacement  2002  . left knee surgery  1998  . right wrist fracture  2006  . SHOULDER ARTHROSCOPY WITH ROTATOR CUFF REPAIR Right 01/05/2013   Procedure: SHOULDER ARTHROSCOPY WITH ROTATOR CUFF REPAIR subchromial decompression and distal clavical excision;  Surgeon:  Lorn Junes, MD;  Location: [facility];  Service: Orthopedics;  Laterality: Right;  . tonsillectomy  1936  . TONSILLECTOMY    . TYMPANOSTOMY TUBE PLACEMENT  1989  . VESICOVAGINAL FISTULA CLOSURE W/ TAH  1968   Social History   Occupational History  . Occupation: homemaker  Tobacco Use  . Smoking status: Former Smoker    Packs/day: 0.50    Years: 20.00    Pack years: 10.00    Types: Cigarettes    Last attempt to quit: 10/22/1987    Years since quitting: 30.0  . Smokeless tobacco: Never Used  Substance and Sexual Activity  . Alcohol use: Yes    Alcohol/week: 4.2 oz    Types: 7 Glasses of wine per week    Comment: social drinker  . Drug use: No  . Sexual activity: Not on file

## 2017-11-20 DIAGNOSIS — I129 Hypertensive chronic kidney disease with stage 1 through stage 4 chronic kidney disease, or unspecified chronic kidney disease: Secondary | ICD-10-CM | POA: Diagnosis not present

## 2017-11-20 DIAGNOSIS — D631 Anemia in chronic kidney disease: Secondary | ICD-10-CM | POA: Diagnosis not present

## 2017-11-20 DIAGNOSIS — N2581 Secondary hyperparathyroidism of renal origin: Secondary | ICD-10-CM | POA: Diagnosis not present

## 2017-11-20 DIAGNOSIS — N184 Chronic kidney disease, stage 4 (severe): Secondary | ICD-10-CM | POA: Diagnosis not present

## 2017-12-10 DIAGNOSIS — E1129 Type 2 diabetes mellitus with other diabetic kidney complication: Secondary | ICD-10-CM | POA: Diagnosis not present

## 2017-12-10 DIAGNOSIS — N184 Chronic kidney disease, stage 4 (severe): Secondary | ICD-10-CM | POA: Diagnosis not present

## 2017-12-10 DIAGNOSIS — I5032 Chronic diastolic (congestive) heart failure: Secondary | ICD-10-CM | POA: Diagnosis not present

## 2017-12-10 DIAGNOSIS — E1142 Type 2 diabetes mellitus with diabetic polyneuropathy: Secondary | ICD-10-CM | POA: Diagnosis not present

## 2017-12-31 ENCOUNTER — Other Ambulatory Visit: Payer: Self-pay

## 2017-12-31 ENCOUNTER — Ambulatory Visit (HOSPITAL_COMMUNITY): Payer: Medicare Other | Attending: Cardiology

## 2017-12-31 DIAGNOSIS — N189 Chronic kidney disease, unspecified: Secondary | ICD-10-CM | POA: Diagnosis not present

## 2017-12-31 DIAGNOSIS — I5032 Chronic diastolic (congestive) heart failure: Secondary | ICD-10-CM | POA: Diagnosis not present

## 2017-12-31 DIAGNOSIS — E1122 Type 2 diabetes mellitus with diabetic chronic kidney disease: Secondary | ICD-10-CM | POA: Diagnosis not present

## 2017-12-31 DIAGNOSIS — Z8249 Family history of ischemic heart disease and other diseases of the circulatory system: Secondary | ICD-10-CM | POA: Diagnosis not present

## 2017-12-31 DIAGNOSIS — I350 Nonrheumatic aortic (valve) stenosis: Secondary | ICD-10-CM

## 2017-12-31 DIAGNOSIS — Z87891 Personal history of nicotine dependence: Secondary | ICD-10-CM | POA: Diagnosis not present

## 2017-12-31 DIAGNOSIS — R55 Syncope and collapse: Secondary | ICD-10-CM | POA: Diagnosis not present

## 2017-12-31 DIAGNOSIS — I130 Hypertensive heart and chronic kidney disease with heart failure and stage 1 through stage 4 chronic kidney disease, or unspecified chronic kidney disease: Secondary | ICD-10-CM | POA: Insufficient documentation

## 2017-12-31 DIAGNOSIS — J45909 Unspecified asthma, uncomplicated: Secondary | ICD-10-CM | POA: Insufficient documentation

## 2018-01-02 ENCOUNTER — Other Ambulatory Visit: Payer: Self-pay | Admitting: Cardiology

## 2018-01-02 DIAGNOSIS — I5033 Acute on chronic diastolic (congestive) heart failure: Secondary | ICD-10-CM

## 2018-01-02 DIAGNOSIS — I350 Nonrheumatic aortic (valve) stenosis: Secondary | ICD-10-CM

## 2018-01-02 DIAGNOSIS — I10 Essential (primary) hypertension: Secondary | ICD-10-CM

## 2018-01-05 DIAGNOSIS — I5032 Chronic diastolic (congestive) heart failure: Secondary | ICD-10-CM | POA: Diagnosis not present

## 2018-01-05 DIAGNOSIS — N184 Chronic kidney disease, stage 4 (severe): Secondary | ICD-10-CM | POA: Diagnosis not present

## 2018-01-05 DIAGNOSIS — I130 Hypertensive heart and chronic kidney disease with heart failure and stage 1 through stage 4 chronic kidney disease, or unspecified chronic kidney disease: Secondary | ICD-10-CM | POA: Diagnosis not present

## 2018-01-05 DIAGNOSIS — M1991 Primary osteoarthritis, unspecified site: Secondary | ICD-10-CM | POA: Diagnosis not present

## 2018-01-05 DIAGNOSIS — I480 Paroxysmal atrial fibrillation: Secondary | ICD-10-CM | POA: Diagnosis not present

## 2018-01-05 DIAGNOSIS — E1122 Type 2 diabetes mellitus with diabetic chronic kidney disease: Secondary | ICD-10-CM | POA: Diagnosis not present

## 2018-01-28 ENCOUNTER — Ambulatory Visit: Payer: Medicare Other | Admitting: Cardiology

## 2018-02-18 DIAGNOSIS — E559 Vitamin D deficiency, unspecified: Secondary | ICD-10-CM | POA: Diagnosis not present

## 2018-03-04 DIAGNOSIS — E559 Vitamin D deficiency, unspecified: Secondary | ICD-10-CM | POA: Diagnosis not present

## 2018-03-04 DIAGNOSIS — N184 Chronic kidney disease, stage 4 (severe): Secondary | ICD-10-CM | POA: Diagnosis not present

## 2018-03-09 DIAGNOSIS — N2581 Secondary hyperparathyroidism of renal origin: Secondary | ICD-10-CM | POA: Diagnosis not present

## 2018-03-09 DIAGNOSIS — N184 Chronic kidney disease, stage 4 (severe): Secondary | ICD-10-CM | POA: Diagnosis not present

## 2018-03-09 DIAGNOSIS — D631 Anemia in chronic kidney disease: Secondary | ICD-10-CM | POA: Diagnosis not present

## 2018-03-09 DIAGNOSIS — I129 Hypertensive chronic kidney disease with stage 1 through stage 4 chronic kidney disease, or unspecified chronic kidney disease: Secondary | ICD-10-CM | POA: Diagnosis not present

## 2018-03-10 ENCOUNTER — Ambulatory Visit (INDEPENDENT_AMBULATORY_CARE_PROVIDER_SITE_OTHER): Payer: Medicare Other | Admitting: Orthopaedic Surgery

## 2018-03-10 ENCOUNTER — Encounter (INDEPENDENT_AMBULATORY_CARE_PROVIDER_SITE_OTHER): Payer: Self-pay | Admitting: Orthopaedic Surgery

## 2018-03-10 ENCOUNTER — Ambulatory Visit (INDEPENDENT_AMBULATORY_CARE_PROVIDER_SITE_OTHER): Payer: Self-pay

## 2018-03-10 VITALS — BP 172/57 | HR 67 | Resp 20 | Ht 60.0 in | Wt 189.0 lb

## 2018-03-10 DIAGNOSIS — M25561 Pain in right knee: Secondary | ICD-10-CM | POA: Diagnosis not present

## 2018-03-10 DIAGNOSIS — G8929 Other chronic pain: Secondary | ICD-10-CM

## 2018-03-10 MED ORDER — METHYLPREDNISOLONE ACETATE 40 MG/ML IJ SUSP
80.0000 mg | INTRAMUSCULAR | Status: AC | PRN
  Administered 2018-03-10: 80 mg

## 2018-03-10 MED ORDER — LIDOCAINE HCL 1 % IJ SOLN
2.0000 mL | INTRAMUSCULAR | Status: AC | PRN
  Administered 2018-03-10: 2 mL

## 2018-03-10 MED ORDER — BUPIVACAINE HCL 0.5 % IJ SOLN
2.0000 mL | INTRAMUSCULAR | Status: AC | PRN
  Administered 2018-03-10: 2 mL via INTRA_ARTICULAR

## 2018-03-10 NOTE — Progress Notes (Signed)
Office Visit Note   Patient: Ann Sellers           Date of Birth: 09-21-1925           MRN: [id number] Visit Date: 03/10/2018              Requested by: Reynold Bowen, MD [place], [place] [id number] PCP: Reynold Bowen, MD   Assessment & Plan: Visit Diagnoses:  1. Chronic pain of right knee     Plan: End-stage osteoarthritis right knee.  I tried a number of support braces for her knee which just did not fit because of the size of her thigh compared to the size of her leg.  I gave her a prescription to go to [facility] see if they can fit her with an appropriate brace.  She is having sensation of her knee giving way.  I will inject the knee with cortisone to provide her with some relief.  Discussed definitive treatment of knee replacement but this is main as has a cardiac issue which I suspect will preclude surgical intervention.  And to see her back as needed.  I spent over 40 minutes with her face-to-face discussing her problems different treatment options.  50% of the time was involved with counseling as to her diagnosis and as above treatment options.  Follow-Up Instructions: Return if symptoms worsen or fail to improve.   Orders:  Orders Placed This Encounter  Procedures  . Large Joint Inj: R knee  . XR KNEE 3 VIEW RIGHT   No orders of the defined types were placed in this encounter.     Procedures: Large Joint Inj: R knee on 03/10/2018 11:10 AM Indications: pain and diagnostic evaluation Details: 25 G 1.5 in needle, anteromedial approach  Arthrogram: No  Medications: 2 mL lidocaine 1 %; 2 mL bupivacaine 0.5 %; 80 mg methylPREDNISolone acetate 40 MG/ML Procedure, treatment alternatives, risks and benefits explained, specific risks discussed. Consent was given by the patient. Immediately prior to procedure a time out was called to verify the correct patient, procedure, equipment, support staff and site/side marked as required. Patient was prepped and draped in  the usual sterile fashion.       Clinical Data: No additional findings.   Subjective: Chief Complaint  Patient presents with  . Right Knee - Pain  . New Patient (Initial Visit)    right knee pain for 40 years, gives away at times, did PT but didnt help  AnnSellers is presently 84 years old and visited the office for evaluation of right knee pain.  She does have history of osteoarthritis bilaterally.  She has had a prior left total knee replacement has done very well.  She has a number of issues and comorbidities that have resulted in a problem with her balance and lower extremity weakness and she does use a walker.  She has had more problems recently with her right knee without injury or trauma.  There knee will be tight "achy".  On occasion she has a feeling her knee may give way.  She is limited in her ability to apply any devices to her knee and wanted to know if there was something that we had in the office it might help.  She does have a cardiac history and has been told that she cannot have any major surgery.  HPI  Review of Systems  Constitutional: Negative for fatigue and fever.  HENT: Negative for ear pain.   Eyes: Negative for pain.  Respiratory: Positive for shortness of breath. Negative for cough.   Cardiovascular: Negative for leg swelling.  Gastrointestinal: Positive for diarrhea. Negative for constipation.  Genitourinary: Negative for difficulty urinating.  Musculoskeletal: Positive for back pain and neck pain.  Skin: Negative for rash.  Allergic/Immunologic: Negative for food allergies.  Neurological: Negative for weakness and numbness.  Hematological: Bruises/bleeds easily.  Psychiatric/Behavioral: Positive for sleep disturbance.     Objective: Vital Signs: BP (!) 172/57 (BP Location: Right Arm, Patient Position: Sitting, Cuff Size: Normal)   Pulse 67   Resp 20   Ht 5' (1.524 m)   Wt 189 lb (85.7 kg)   BMI 36.91 kg/m   Physical Exam  Constitutional: She  is oriented to person, place, and time. She appears well-developed and well-nourished.  HENT:  Mouth/Throat: Oropharynx is clear and moist.  Eyes: Pupils are equal, round, and reactive to light. EOM are normal.  Pulmonary/Chest: Effort normal.  Neurological: She is alert and oriented to person, place, and time.  Skin: Skin is warm and dry.  Psychiatric: She has a normal mood and affect. Her behavior is normal.    Ortho Exam awake alert and oriented x3 comfortable sitting.  No shortness of breath or chest pain.  Walk with the use of a a walker.  Her gait is slow and deliberate.  Prior left total knee replacement is well-healed.  The knee was not hot warm or red.  No instability.  Full extension degrees of flexion.  Right knee is increased varus with weightbearing.  Pain is predominant along the medial compartment.  Skin is intact.  Very small effusion.  No pain laterally.  Positive patellar crepitation.  No popliteal pain or mass.  No swelling distally.  +1 pulses.  Motor exam intact.  Straight leg raise negative.  Specialty Comments:  No specialty comments available.  Imaging: Xr Knee 3 View Right  Result Date: 03/10/2018 Films of the right knee were obtained in 3 projections standing.  There is complete collapse of the medial compartment with bone-on-bone apposition.  Subchondral cysts in both the tibia and the femur medially associated with subchondral sclerosis.  Approximately 4 degrees of varus.  There are degenerative changes in both the lateral compartment and the patellofemoral joint as well.  Findings are consistent with end-stage osteoarthritis    PMFS History: Patient Active Problem List   Diagnosis Date Noted  . PAF (paroxysmal atrial fibrillation) ([place]) 12/23/2016  . Aortic stenosis, moderate 09/16/2016  . Community acquired pneumonia 09/13/2016  . Lower extremity weakness 09/13/2016  . Acute on chronic diastolic CHF (congestive heart failure) ([place]) 09/13/2016  . Severe  persistent chronic asthma without complication 15/72/6203  . Morbid obesity ([place]) 07/06/2015  . Edema 07/22/2014  . Varicose veins of lower extremities with complications 55/97/4163  . CKD (chronic kidney disease), stage III ([place]) 02/01/2014  . Right rotator cuff tear   . Hypertension   . Arthritis   . Insulin dependent diabetes mellitus with complications ([place])   . Spinal stenosis   . Wears glasses   . Wears dentures   . Wears hearing aid   . Asthma 07/11/2011  . Vitamin D deficiency 07/11/2011  . Osteoarthritis 07/11/2011  . Cough 07/11/2011   Past Medical History:  Diagnosis Date  . Anemia   . Arthritis   . Asthma   . B12 deficiency   . Chronic diastolic CHF (congestive heart failure) ([place])   . CKD (chronic kidney disease), stage III ([place])   . Diabetes mellitus ([place])   .  Hypertension   . Moderate aortic stenosis   . Neuromuscular disorder ([place])   . PAF (paroxysmal atrial fibrillation) ([place])   . Right rotator cuff tear   . Wears dentures    top denture-bottom partial  . Wears glasses   . Wears hearing aid    both ears    Family History  Problem Relation Age of Onset  . Pneumonia Mother   . Diabetes Son   . Hypertension Son   . Heart disease Sister   . Hypertension Sister   . Diabetes Sister   . Hypertension Sister   . Diabetes Daughter     Past Surgical History:  Procedure Laterality Date  . APPENDECTOMY  1945  . CATARACT EXTRACTION, BILATERAL  2008  . cervical stynosis  2005  . esophageal stretch  2001  . [place]  . herniated disc  1983  . left hand surgery  2011   gout  . left knee replacement  2002  . left knee surgery  1998  . right wrist fracture  2006  . SHOULDER ARTHROSCOPY WITH ROTATOR CUFF REPAIR Right 01/05/2013   Procedure: SHOULDER ARTHROSCOPY WITH ROTATOR CUFF REPAIR subchromial decompression and distal clavical excision;  Surgeon: Lorn Junes, MD;  Location: [facility];  Service: Orthopedics;   Laterality: Right;  . tonsillectomy  1936  . TONSILLECTOMY    . TYMPANOSTOMY TUBE PLACEMENT  1989  . VESICOVAGINAL FISTULA CLOSURE W/ TAH  1968   Social History   Occupational History  . Occupation: homemaker  Tobacco Use  . Smoking status: Former Smoker    Packs/day: 0.50    Years: 20.00    Pack years: 10.00    Types: Cigarettes    Last attempt to quit: 10/22/1987    Years since quitting: 30.4  . Smokeless tobacco: Never Used  Substance and Sexual Activity  . Alcohol use: Yes    Alcohol/week: 4.2 oz    Types: 7 Glasses of wine per week    Comment: social drinker  . Drug use: No  . Sexual activity: Not on file

## 2018-03-31 DIAGNOSIS — R05 Cough: Secondary | ICD-10-CM | POA: Diagnosis not present

## 2018-04-03 ENCOUNTER — Other Ambulatory Visit: Payer: Self-pay | Admitting: Cardiology

## 2018-04-03 DIAGNOSIS — I350 Nonrheumatic aortic (valve) stenosis: Secondary | ICD-10-CM

## 2018-04-03 DIAGNOSIS — I10 Essential (primary) hypertension: Secondary | ICD-10-CM

## 2018-04-03 DIAGNOSIS — I5033 Acute on chronic diastolic (congestive) heart failure: Secondary | ICD-10-CM

## 2018-04-09 DIAGNOSIS — E1129 Type 2 diabetes mellitus with other diabetic kidney complication: Secondary | ICD-10-CM | POA: Diagnosis not present

## 2018-04-09 DIAGNOSIS — E559 Vitamin D deficiency, unspecified: Secondary | ICD-10-CM | POA: Diagnosis not present

## 2018-04-09 DIAGNOSIS — J189 Pneumonia, unspecified organism: Secondary | ICD-10-CM | POA: Diagnosis not present

## 2018-04-09 DIAGNOSIS — E7849 Other hyperlipidemia: Secondary | ICD-10-CM | POA: Diagnosis not present

## 2018-04-09 DIAGNOSIS — N184 Chronic kidney disease, stage 4 (severe): Secondary | ICD-10-CM | POA: Diagnosis not present

## 2018-04-30 DIAGNOSIS — I8310 Varicose veins of unspecified lower extremity with inflammation: Secondary | ICD-10-CM | POA: Diagnosis not present

## 2018-04-30 DIAGNOSIS — J449 Chronic obstructive pulmonary disease, unspecified: Secondary | ICD-10-CM | POA: Diagnosis not present

## 2018-04-30 DIAGNOSIS — R918 Other nonspecific abnormal finding of lung field: Secondary | ICD-10-CM | POA: Diagnosis not present

## 2018-04-30 DIAGNOSIS — J189 Pneumonia, unspecified organism: Secondary | ICD-10-CM | POA: Diagnosis not present

## 2018-04-30 DIAGNOSIS — L03119 Cellulitis of unspecified part of limb: Secondary | ICD-10-CM | POA: Diagnosis not present

## 2018-05-04 DIAGNOSIS — J189 Pneumonia, unspecified organism: Secondary | ICD-10-CM | POA: Diagnosis not present

## 2018-05-04 DIAGNOSIS — L03119 Cellulitis of unspecified part of limb: Secondary | ICD-10-CM | POA: Diagnosis not present

## 2018-05-04 DIAGNOSIS — N184 Chronic kidney disease, stage 4 (severe): Secondary | ICD-10-CM | POA: Diagnosis not present

## 2018-05-04 DIAGNOSIS — I5032 Chronic diastolic (congestive) heart failure: Secondary | ICD-10-CM | POA: Diagnosis not present

## 2018-05-04 DIAGNOSIS — D649 Anemia, unspecified: Secondary | ICD-10-CM | POA: Diagnosis not present

## 2018-05-05 ENCOUNTER — Other Ambulatory Visit: Payer: Self-pay | Admitting: Endocrinology

## 2018-05-05 DIAGNOSIS — R911 Solitary pulmonary nodule: Secondary | ICD-10-CM

## 2018-05-06 DIAGNOSIS — J189 Pneumonia, unspecified organism: Secondary | ICD-10-CM | POA: Diagnosis not present

## 2018-05-06 DIAGNOSIS — I5032 Chronic diastolic (congestive) heart failure: Secondary | ICD-10-CM | POA: Diagnosis not present

## 2018-05-06 DIAGNOSIS — L03119 Cellulitis of unspecified part of limb: Secondary | ICD-10-CM | POA: Diagnosis not present

## 2018-05-06 DIAGNOSIS — N184 Chronic kidney disease, stage 4 (severe): Secondary | ICD-10-CM | POA: Diagnosis not present

## 2018-05-07 DIAGNOSIS — J449 Chronic obstructive pulmonary disease, unspecified: Secondary | ICD-10-CM | POA: Diagnosis not present

## 2018-05-07 DIAGNOSIS — N184 Chronic kidney disease, stage 4 (severe): Secondary | ICD-10-CM | POA: Diagnosis not present

## 2018-05-07 DIAGNOSIS — I8310 Varicose veins of unspecified lower extremity with inflammation: Secondary | ICD-10-CM | POA: Diagnosis not present

## 2018-05-07 DIAGNOSIS — I5032 Chronic diastolic (congestive) heart failure: Secondary | ICD-10-CM | POA: Diagnosis not present

## 2018-05-14 ENCOUNTER — Ambulatory Visit
Admission: RE | Admit: 2018-05-14 | Discharge: 2018-05-14 | Disposition: A | Discharge status: Home / Self Care | Payer: Medicare Other | Source: Ambulatory Visit | Attending: Endocrinology | Admitting: Endocrinology

## 2018-05-14 DIAGNOSIS — R911 Solitary pulmonary nodule: Secondary | ICD-10-CM | POA: Diagnosis not present

## 2018-06-29 DIAGNOSIS — E211 Secondary hyperparathyroidism, not elsewhere classified: Secondary | ICD-10-CM | POA: Diagnosis not present

## 2018-06-29 DIAGNOSIS — J439 Emphysema, unspecified: Secondary | ICD-10-CM | POA: Diagnosis not present

## 2018-06-29 DIAGNOSIS — E7849 Other hyperlipidemia: Secondary | ICD-10-CM | POA: Diagnosis not present

## 2018-06-29 DIAGNOSIS — Z23 Encounter for immunization: Secondary | ICD-10-CM | POA: Diagnosis not present

## 2018-06-29 DIAGNOSIS — E1129 Type 2 diabetes mellitus with other diabetic kidney complication: Secondary | ICD-10-CM | POA: Diagnosis not present

## 2018-06-29 DIAGNOSIS — I779 Disorder of arteries and arterioles, unspecified: Secondary | ICD-10-CM | POA: Diagnosis not present

## 2018-06-29 DIAGNOSIS — N184 Chronic kidney disease, stage 4 (severe): Secondary | ICD-10-CM | POA: Diagnosis not present

## 2018-07-02 ENCOUNTER — Telehealth: Payer: Self-pay

## 2018-07-02 NOTE — Telephone Encounter (Signed)
Notes on file.

## 2018-07-16 ENCOUNTER — Ambulatory Visit: Payer: Medicare Other | Admitting: Cardiology

## 2018-07-27 DIAGNOSIS — H353132 Nonexudative age-related macular degeneration, bilateral, intermediate dry stage: Secondary | ICD-10-CM | POA: Diagnosis not present

## 2018-07-27 DIAGNOSIS — Z961 Presence of intraocular lens: Secondary | ICD-10-CM | POA: Diagnosis not present

## 2018-07-27 DIAGNOSIS — H3530 Unspecified macular degeneration: Secondary | ICD-10-CM | POA: Diagnosis not present

## 2018-07-27 DIAGNOSIS — E109 Type 1 diabetes mellitus without complications: Secondary | ICD-10-CM | POA: Diagnosis not present

## 2018-07-27 DIAGNOSIS — H5213 Myopia, bilateral: Secondary | ICD-10-CM | POA: Diagnosis not present

## 2018-08-12 ENCOUNTER — Encounter (INDEPENDENT_AMBULATORY_CARE_PROVIDER_SITE_OTHER): Payer: Self-pay

## 2018-08-12 ENCOUNTER — Ambulatory Visit: Payer: Medicare Other | Admitting: Cardiology

## 2018-08-12 ENCOUNTER — Encounter: Payer: Self-pay | Admitting: Cardiology

## 2018-08-12 VITALS — BP 158/60 | HR 93 | Ht 60.0 in | Wt 185.1 lb

## 2018-08-12 DIAGNOSIS — I480 Paroxysmal atrial fibrillation: Secondary | ICD-10-CM | POA: Diagnosis not present

## 2018-08-12 DIAGNOSIS — I10 Essential (primary) hypertension: Secondary | ICD-10-CM

## 2018-08-12 DIAGNOSIS — I350 Nonrheumatic aortic (valve) stenosis: Secondary | ICD-10-CM | POA: Diagnosis not present

## 2018-08-12 DIAGNOSIS — I5032 Chronic diastolic (congestive) heart failure: Secondary | ICD-10-CM

## 2018-08-12 NOTE — Patient Instructions (Signed)
Medication Instructions:  Your physician recommends that you continue on your current medications as directed. Please refer to the Current Medication list given to you today.  If you need a refill on your cardiac medications before your next appointment, please call your pharmacy.   Lab work: None ordered If you have labs (blood work) drawn today and your tests are completely normal, you will receive your results only by: Marland Kitchen MyChart Message (if you have MyChart) OR . A paper copy in the mail If you have any lab test that is abnormal or we need to change your treatment, we will call you to review the results.  Testing/Procedures: None ordered  Follow-Up: Your physician recommends that you schedule a follow-up appointment in: [place]. Meda Coffee

## 2018-08-12 NOTE — Progress Notes (Signed)
08/12/2018 Ann Sellers   March 26, 1925  [id number]  Primary Physician Reynold Bowen, MD Primary Cardiologist: Dr. Meda Coffee   Reason for Visit/CC: f/u for Chronic Diastolic HF and Aortic Stenosis.   HPI: Ann Sellers is a 84 y.o. female (appearing younger than stated age) with history of CKD III, DM, HTN, anemia, B12 deficiency, asthma, aortic stenosis and chronic diastolic CHF who presents to clinic today for f/u of CHF. Per rview of chart, she had elevated troponin of 0.25 while in the hospital in November 2017 during an episode of pneumonia and acute on chronic kidney insufficiency. This was felt to be due to demand ischemia. She also had paroxysms of atrial fib felt possibly due to her acute illness. She was not anticoagulated secondary to a history of recurrent epistaxis and anemia. She had a 2D Echo 09/14/16 that showed mild LVH, EF 65-70%, grade 1 DD, moderate aortic stenosis, mild MR/TR. She has required diuretics for her diastolic HF. She is followed by Dr. Meda Coffee. Last OV was 10/2017. She was in NSR at last OV and was felt to be euvolemic from a volume standpoint. Dr. Meda Coffee ordered for a repeat echo to reevaluate her aortic stenosis. This was done 12/31/17 and showed stable modereate AS, mean gradietn 17 mm Hg. EF was normal. G1DD noted.   She is back today for f/u. She reports she is doing well. She denies CP. She has stable chronic dyspnea with moderate physical activity but no change in her baseline. Her BP is elevated at 158/60, but she did not take any of her BP meds prior to today's visit. She was afraid to take Lasix while being away from home. Also did not take metoprolol. She plans to take her meds when she returns home.   Cardiac Studies  2D Echo 12/2017 Study Conclusions  - Left ventricle: The cavity size was normal. Wall thickness was   increased in a pattern of mild LVH. Systolic function was   vigorous. The estimated ejection fraction was in the range of 65%   to 70%.  Wall motion was normal; there were no regional wall   motion abnormalities. Doppler parameters are consistent with   abnormal left ventricular relaxation (grade 1 diastolic   dysfunction). - Aortic valve: There was mild to moderate stenosis. Valve area   (VTI): 1.19 cm^2. Valve area (Vmax): 1.38 cm^2. Valve area   (Vmean): 1.21 cm^2. - Mitral valve: Valve area by pressure half-time: 2.2 cm^2. Valve   area by continuity equation (using LVOT flow): 1.76 cm^2.   Current Meds  Medication Sig  . albuterol (PROVENTIL HFA;VENTOLIN HFA) 108 (90 BASE) MCG/ACT inhaler Inhale 2 puffs into the lungs every 6 (six) hours as needed for wheezing or shortness of breath.   Marland Kitchen albuterol (PROVENTIL) (2.5 MG/3ML) 0.083% nebulizer solution Take 2.5 mg by nebulization every 6 (six) hours as needed for wheezing or shortness of breath.  . allopurinol (ZYLOPRIM) 100 MG tablet Take 100 mg by mouth daily.    . calcitRIOL (ROCALTROL) 0.25 MCG capsule Take 1 capsule by mouth daily.  . diclofenac sodium (VOLTAREN) 1 % GEL Apply 2 g topically daily as needed (pain).  Marland Kitchen diphenoxylate-atropine (LOMOTIL) 2.5-0.025 MG per tablet Take 1 tablet by mouth daily as needed for diarrhea or loose stools.  . fexofenadine (ALLEGRA) 180 MG tablet Take 1 tablet (180 mg total) by mouth daily.  . fluticasone (FLONASE) 50 MCG/ACT nasal spray Place 2 sprays into both nostrils daily.  . furosemide (LASIX) 40 MG  tablet TAKE 1 TABLET BY MOUTH EVERY DAY  . gabapentin (NEURONTIN) 300 MG capsule Take 300 mg by mouth 3 (three) times daily.  . GNP ASPIRIN LOW DOSE 81 MG EC tablet Take 81 mg by mouth daily.  Marland Kitchen HYDROcodone-acetaminophen (NORCO/VICODIN) 5-325 MG per tablet Take 0.5-1 tablets by mouth every 6 (six) hours as needed for moderate pain.   Marland Kitchen insulin glargine (LANTUS) 100 UNIT/ML injection Inject 20 Units into the skin at bedtime.   Marland Kitchen JANUVIA 50 MG tablet Take 50 mg by mouth daily.  . metoprolol succinate (TOPROL-XL) 25 MG 24 hr tablet Take  25 mg by mouth daily.  . mometasone-formoterol (DULERA) 100-5 MCG/ACT AERO Take 2 puffs first thing in am and then another 2 puffs about 12 hours later.  . montelukast (SINGULAIR) 10 MG tablet Take 10 mg by mouth at bedtime.  . Multiple Vitamins-Minerals (ICAPS MV) TABS Take 1 tablet by mouth 2 (two) times daily. Reported on 11/20/2015  . omeprazole (PRILOSEC) 20 MG capsule Take 20 mg by mouth at bedtime.   Marland Kitchen omeprazole (PRILOSEC) 40 MG capsule Take 30-60 min before first meal of the day  . POLY-IRON 150 150 MG capsule Take 150 mg by mouth daily.  . predniSONE (DELTASONE) 20 MG tablet Take 20 mg by mouth daily as needed (wheezing, shortness of breath). STANDING ORDER  . Respiratory Therapy Supplies (FLUTTER) DEVI Use as directed  . tiZANidine (ZANAFLEX) 4 MG tablet Take 4 mg by mouth every 8 (eight) hours as needed for muscle spasms.   . Vitamin D, Ergocalciferol, (DRISDOL) 50000 units CAPS capsule Take 50,000 Units by mouth once a week.   Allergies  Allergen Reactions  . Losartan Cough  . Snake Antivenin [Antivenin Crotalidae Polyvalent (Snake Antivenin)] Other (See Comments)    PARALYSIS   Past Medical History:  Diagnosis Date  . Anemia   . Arthritis   . Asthma   . B12 deficiency   . Chronic diastolic CHF (congestive heart failure) ([place])   . CKD (chronic kidney disease), stage III ([place])   . Diabetes mellitus ([place])   . Hypertension   . Moderate aortic stenosis   . Neuromuscular disorder ([place])   . PAF (paroxysmal atrial fibrillation) ([place])   . Right rotator cuff tear   . Wears dentures    top denture-bottom partial  . Wears glasses   . Wears hearing aid    both ears   Family History  Problem Relation Age of Onset  . Pneumonia Mother   . Diabetes Son   . Hypertension Son   . Heart disease Sister   . Hypertension Sister   . Diabetes Sister   . Hypertension Sister   . Diabetes Daughter    Past Surgical History:  Procedure Laterality Date  . APPENDECTOMY  1945  .  CATARACT EXTRACTION, BILATERAL  2008  . cervical stynosis  2005  . esophageal stretch  2001  . [place]  . herniated disc  1983  . left hand surgery  2011   gout  . left knee replacement  2002  . left knee surgery  1998  . right wrist fracture  2006  . SHOULDER ARTHROSCOPY WITH ROTATOR CUFF REPAIR Right 01/05/2013   Procedure: SHOULDER ARTHROSCOPY WITH ROTATOR CUFF REPAIR subchromial decompression and distal clavical excision;  Surgeon: Lorn Junes, MD;  Location: [facility];  Service: Orthopedics;  Laterality: Right;  . tonsillectomy  1936  . TONSILLECTOMY    . TYMPANOSTOMY TUBE PLACEMENT  [place] W/ TAH  1968   Social History   Socioeconomic History  . Marital status: Widowed    Spouse name: Not on file  . Number of children: 6  . Years of education: Not on file  . Highest education level: Not on file  Occupational History  . Occupation: homemaker  Social Needs  . Financial resource strain: Not on file  . Food insecurity:    Worry: Not on file    Inability: Not on file  . Transportation needs:    Medical: Not on file    Non-medical: Not on file  Tobacco Use  . Smoking status: Former Smoker    Packs/day: 0.50    Years: 20.00    Pack years: 10.00    Types: Cigarettes    Last attempt to quit: 10/22/1987    Years since quitting: 30.8  . Smokeless tobacco: Never Used  Substance and Sexual Activity  . Alcohol use: Yes    Alcohol/week: 7.0 standard drinks    Types: 7 Glasses of wine per week    Comment: social drinker  . Drug use: No  . Sexual activity: Not on file  Lifestyle  . Physical activity:    Days per week: Not on file    Minutes per session: Not on file  . Stress: Not on file  Relationships  . Social connections:    Talks on phone: Not on file    Gets together: Not on file    Attends religious service: Not on file    Active member of club or organization: Not on file    Attends meetings  of clubs or organizations: Not on file    Relationship status: Not on file  . Intimate partner violence:    Fear of current or ex partner: Not on file    Emotionally abused: Not on file    Physically abused: Not on file    Forced sexual activity: Not on file  Other Topics Concern  . Not on file  Social History Narrative  . Not on file     Review of Systems: General: negative for chills, fever, night sweats or weight changes.  Cardiovascular: negative for chest pain, dyspnea on exertion, edema, orthopnea, palpitations, paroxysmal nocturnal dyspnea or shortness of breath Dermatological: negative for rash Respiratory: negative for cough or wheezing Urologic: negative for hematuria Abdominal: negative for nausea, vomiting, diarrhea, bright red blood per rectum, melena, or hematemesis Neurologic: negative for visual changes, syncope, or dizziness All other systems reviewed and are otherwise negative except as noted above.   Physical Exam:  Blood pressure (!) 158/60, pulse 93, height 5' (1.524 m), weight 185 lb 1.9 oz (84 kg), SpO2 97 %.  General appearance: alert, cooperative, no distress and elderly WF, ambulating w/ roling walker Neck: no carotid bruit and no JVD Lungs: clear to auscultation bilaterally Heart: regular rate and rhythm, S1, S2 normal, no murmur, click, rub or gallop Extremities: extremities normal, atraumatic, no cyanosis or edema Pulses: 2+ and symmetric Skin: Skin color, texture, turgor normal. No rashes or lesions Neurologic: Grossly normal  EKG not performed -- personally reviewed   ASSESSMENT AND PLAN:   1. Chronic Diastolic HF: G1 DD noted on recent echo. EF normal. euvolemic on exam. Breathing stable. She is on lasix 40 mg daily. BMP last month showed stable SCr at 1.9 (basline) and normal K. Continue Lasix 40 mg.   2. Aortic Stenosis: moderate AS noted on recent echo 12/2017. Mean gradient  17 mm Hg. She has chronic dyspnea with moderate activity but no  change in baseline. She denies CP, syncope/ Near syncope.   3. HTN:  Elevated today at 158/60 but pt did not take her lasix nor metoprolol prior to visit today. Plans to take when she returns home. She will monitor BP at home.   4. PAF: RRR On exam. No symptoms. HR controlled on BB. Not on a/c given h/o epistaxis and anemia.   5. DM: management per PCP.   6. CKD: baseline SCr ~1.9-2.0. On Lasix for diastolic HF. Recent BMP in September showed stable SCr and K.   Follow-Up: f/u with Dr. Meda Coffee in 3 months.   Ann Sellers, MHS [facility] 08/12/2018 3:50 PM

## 2018-11-17 DIAGNOSIS — D631 Anemia in chronic kidney disease: Secondary | ICD-10-CM | POA: Diagnosis not present

## 2018-11-17 DIAGNOSIS — J439 Emphysema, unspecified: Secondary | ICD-10-CM | POA: Diagnosis not present

## 2018-11-17 DIAGNOSIS — E041 Nontoxic single thyroid nodule: Secondary | ICD-10-CM | POA: Diagnosis not present

## 2018-11-17 DIAGNOSIS — E1129 Type 2 diabetes mellitus with other diabetic kidney complication: Secondary | ICD-10-CM | POA: Diagnosis not present

## 2018-11-18 ENCOUNTER — Ambulatory Visit: Payer: Medicare Other | Admitting: Cardiology

## 2018-11-25 ENCOUNTER — Ambulatory Visit: Payer: Medicare Other | Admitting: Cardiology

## 2018-11-25 ENCOUNTER — Encounter: Payer: Self-pay | Admitting: Cardiology

## 2018-11-25 VITALS — BP 138/70 | HR 72 | Ht 60.0 in | Wt 176.8 lb

## 2018-11-25 DIAGNOSIS — I480 Paroxysmal atrial fibrillation: Secondary | ICD-10-CM | POA: Diagnosis not present

## 2018-11-25 DIAGNOSIS — I5032 Chronic diastolic (congestive) heart failure: Secondary | ICD-10-CM

## 2018-11-25 DIAGNOSIS — I10 Essential (primary) hypertension: Secondary | ICD-10-CM | POA: Diagnosis not present

## 2018-11-25 DIAGNOSIS — I350 Nonrheumatic aortic (valve) stenosis: Secondary | ICD-10-CM

## 2018-11-25 NOTE — Progress Notes (Signed)
11/25/2018 Ann Sellers   10-Mar-1925  [id number]  Primary Physician Reynold Bowen, MD Primary Cardiologist: Dr. Meda Coffee   Reason for Visit/CC: f/u for Chronic Diastolic HF and Aortic Stenosis.   HPI: Ann Sellers is a 85 y.o. female (appearing younger than stated age) with history of CKD III, DM, HTN, anemia, B12 deficiency, asthma, aortic stenosis and chronic diastolic CHF who presents to clinic today for f/u of CHF. Per rview of chart, she had elevated troponin of 0.25 while in the hospital in November 2017 during an episode of pneumonia and acute on chronic kidney insufficiency. This was felt to be due to demand ischemia. She also had paroxysms of atrial fib felt possibly due to her acute illness. She was not anticoagulated secondary to a history of recurrent epistaxis and anemia. She had a 2D Echo 09/14/16 that showed mild LVH, EF 65-70%, grade 1 DD, moderate aortic stenosis, mild MR/TR. She has required diuretics for her diastolic HF. She is followed by Dr. Meda Coffee. Last OV was 10/2017. She was in NSR at last OV and was felt to be euvolemic from a volume standpoint. Dr. Meda Coffee ordered for a repeat echo to reevaluate her aortic stenosis. This was done 12/31/17 and showed stable modereate AS, mean gradietn 17 mm Hg. EF was normal. G1DD noted.   11/25/2018 -the patient is coming after 3 months, she denies any chest pain shortness of breath, no dizziness palpitations or falls.  She has been tolerating her medications.  Her most significant problem is back pain for which she was tested for pneumonia and it was negative.  Back pain is improving.  She has lost 20 pounds in the last year.  Cardiac Studies  2D Echo 12/2017 Study Conclusions  - Left ventricle: The cavity size was normal. Wall thickness was   increased in a pattern of mild LVH. Systolic function was   vigorous. The estimated ejection fraction was in the range of 65%   to 70%. Wall motion was normal; there were no regional wall   motion  abnormalities. Doppler parameters are consistent with   abnormal left ventricular relaxation (grade 1 diastolic   dysfunction). - Aortic valve: There was mild to moderate stenosis. Valve area   (VTI): 1.19 cm^2. Valve area (Vmax): 1.38 cm^2. Valve area   (Vmean): 1.21 cm^2. - Mitral valve: Valve area by pressure half-time: 2.2 cm^2. Valve   area by continuity equation (using LVOT flow): 1.76 cm^2.   No outpatient medications have been marked as taking for the 11/25/18 encounter (Office Visit) with Dorothy Spark, MD.   Allergies  Allergen Reactions  . Losartan Cough  . Snake Antivenin [Antivenin Crotalidae Polyvalent (Snake Antivenin)] Other (See Comments)    PARALYSIS   Past Medical History:  Diagnosis Date  . Anemia   . Arthritis   . Asthma   . B12 deficiency   . Chronic diastolic CHF (congestive heart failure) ([place])   . CKD (chronic kidney disease), stage III ([place])   . Diabetes mellitus ([place])   . Hypertension   . Moderate aortic stenosis   . Neuromuscular disorder ([place])   . PAF (paroxysmal atrial fibrillation) ([place])   . Right rotator cuff tear   . Wears dentures    top denture-bottom partial  . Wears glasses   . Wears hearing aid    both ears   Family History  Problem Relation Age of Onset  . Pneumonia Mother   . Diabetes Son   . Hypertension Son   .  Heart disease Sister   . Hypertension Sister   . Diabetes Sister   . Hypertension Sister   . Diabetes Daughter    Past Surgical History:  Procedure Laterality Date  . APPENDECTOMY  1945  . CATARACT EXTRACTION, BILATERAL  2008  . cervical stynosis  2005  . esophageal stretch  2001  . [place]  . herniated disc  1983  . left hand surgery  2011   gout  . left knee replacement  2002  . left knee surgery  1998  . right wrist fracture  2006  . SHOULDER ARTHROSCOPY WITH ROTATOR CUFF REPAIR Right 01/05/2013   Procedure: SHOULDER ARTHROSCOPY WITH ROTATOR CUFF REPAIR subchromial decompression  and distal clavical excision;  Surgeon: Lorn Junes, MD;  Location: [facility];  Service: Orthopedics;  Laterality: Right;  . tonsillectomy  1936  . TONSILLECTOMY    . TYMPANOSTOMY TUBE PLACEMENT  1989  . VESICOVAGINAL FISTULA CLOSURE W/ TAH  1968   Social History   Socioeconomic History  . Marital status: Widowed    Spouse name: Not on file  . Number of children: 6  . Years of education: Not on file  . Highest education level: Not on file  Occupational History  . Occupation: homemaker  Social Needs  . Financial resource strain: Not on file  . Food insecurity:    Worry: Not on file    Inability: Not on file  . Transportation needs:    Medical: Not on file    Non-medical: Not on file  Tobacco Use  . Smoking status: Former Smoker    Packs/day: 0.50    Years: 20.00    Pack years: 10.00    Types: Cigarettes    Last attempt to quit: 10/22/1987    Years since quitting: 31.1  . Smokeless tobacco: Never Used  Substance and Sexual Activity  . Alcohol use: Yes    Alcohol/week: 7.0 standard drinks    Types: 7 Glasses of wine per week    Comment: social drinker  . Drug use: No  . Sexual activity: Not on file  Lifestyle  . Physical activity:    Days per week: Not on file    Minutes per session: Not on file  . Stress: Not on file  Relationships  . Social connections:    Talks on phone: Not on file    Gets together: Not on file    Attends religious service: Not on file    Active member of club or organization: Not on file    Attends meetings of clubs or organizations: Not on file    Relationship status: Not on file  . Intimate partner violence:    Fear of current or ex partner: Not on file    Emotionally abused: Not on file    Physically abused: Not on file    Forced sexual activity: Not on file  Other Topics Concern  . Not on file  Social History Narrative  . Not on file     Review of Systems: General: negative for chills, fever, night sweats or  weight changes.  Cardiovascular: negative for chest pain, dyspnea on exertion, edema, orthopnea, palpitations, paroxysmal nocturnal dyspnea or shortness of breath Dermatological: negative for rash Respiratory: negative for cough or wheezing Urologic: negative for hematuria Abdominal: negative for nausea, vomiting, diarrhea, bright red blood per rectum, melena, or hematemesis Neurologic: negative for visual changes, syncope, or dizziness All other systems reviewed and are otherwise negative except  as noted above.   Physical Exam:  Height 5' (1.524 m), weight 176 lb 12.8 oz (80.2 kg).  General appearance: alert, cooperative, no distress and elderly WF, ambulating w/ roling walker Neck: no carotid bruit and no JVD Lungs: clear to auscultation bilaterally Heart: regular rate and rhythm, S1, S2 normal, no murmur, click, rub or gallop Extremities: extremities normal, atraumatic, no cyanosis or edema Pulses: 2+ and symmetric Skin: Skin color, texture, turgor normal. No rashes or lesions Neurologic: Grossly normal  EKG  Performed, shows normal sinus rhythm, right bundle branch block with left anterior fascicular block, this is unchanged from prior- personally reviewed    ASSESSMENT AND PLAN:   1. Chronic Diastolic HF: EF normal. euvolemic on exam. Breathing stable. She is on lasix 40 mg daily. BMP last year showed stable SCr at 1.9 (basline), her labs were checked last week at [facility], will obtain those.   2. Aortic Stenosis: moderate AS noted on recent echo 12/2017. Mean gradient 17 mm Hg.  She has no symptoms right now, however her physical exam shows loud Solik murmur and missing S2, will obtain repeat echocardiogram.  3. HTN: Controlled  4. PAF: In sinus rhythm today, she is not on anticoagulation secondary to prior history of anemia and epistaxis.  5. DM: management per PCP.   6. CKD: baseline SCr ~1.9-2.0.  We will obtain labs from her PCP a week ago.  Follow-Up: In 4  months  Ena Dawley, MD [facility] 11/25/2018 9:08 AM

## 2018-11-25 NOTE — Patient Instructions (Signed)
Medication Instructions:   Your physician recommends that you continue on your current medications as directed. Please refer to the Current Medication list given to you today.  If you need a refill on your cardiac medications before your next appointment, please call your pharmacy.     Testing/Procedures:  Your physician has requested that you have an echocardiogram. Echocardiography is a painless test that uses sound waves to create images of your heart. It provides your doctor with information about the size and shape of your heart and how well your heart's chambers and valves are working. This procedure takes approximately one hour. There are no restrictions for this procedure.     Follow-Up:  4 MONTHS WITH AN EXTENDER

## 2018-12-02 ENCOUNTER — Other Ambulatory Visit (HOSPITAL_COMMUNITY): Payer: Medicare Other

## 2019-02-22 ENCOUNTER — Telehealth: Payer: Self-pay

## 2019-02-22 NOTE — Telephone Encounter (Signed)

## 2019-02-22 NOTE — Progress Notes (Signed)
Virtual Visit via Video Note   This visit type was conducted due to national recommendations for restrictions regarding the COVID-19 Pandemic (e.g. social distancing) in an effort to limit this patient's exposure and mitigate transmission in our community.  Due to her co-morbid illnesses, this patient is at least at moderate risk for complications without adequate follow up.  This format is felt to be most appropriate for this patient at this time.  All issues noted in this document were discussed and addressed.  A limited physical exam was performed with this format.  Please refer to the patient's chart for her consent to telehealth for [facility].   Date:  02/23/2019   ID:  Ann Sellers, DOB Oct 19, 1925, MRN [id number]  Patient Location: Home Provider Location: Home  PCP:  Reynold Bowen, MD  Cardiologist:  Ena Dawley, MD  Electrophysiologist:  None   Evaluation Performed:  Follow-Up Visit  Chief Complaint:  F/U  History of Present Illness:    Ann Sellers is a 85 y.o. female with history of chronic diastolic CHF Echo 02/6811 normal LVEF grade 1 DD, moderate aortic stenosis mean gradient 17 mmHg, PAF in the setting of acute illness and prior history of anemia and epistasis so not anticoagulated, CKD baseline creatinine around 2.0, hypertension and DM.  Patient last saw Dr. Meda Coffee 11/25/2018 at which time she was doing well.  2D echo ordered because of loud murmur but it has not been completed yet.  Patient denies chest pain or shortness of breath. No dizziness or presyncope. Does her cooking, laundry and some yard work. She walks with a walker and gets tired and short of breath if she does too much.   The patient does not have symptoms concerning for COVID-19 infection (fever, chills, cough, or new shortness of breath).    Past Medical History:  Diagnosis Date   Anemia    Arthritis    Asthma    B12 deficiency    Chronic diastolic CHF (congestive heart failure) (HCC)     CKD (chronic kidney disease), stage III (HCC)    Diabetes mellitus ([place])    Hypertension    Moderate aortic stenosis    Neuromuscular disorder (HCC)    PAF (paroxysmal atrial fibrillation) ([place])    Right rotator cuff tear    Wears dentures    top denture-bottom partial   Wears glasses    Wears hearing aid    both ears   Past Surgical History:  Procedure Laterality Date   APPENDECTOMY  1945   CATARACT EXTRACTION, BILATERAL  2008   cervical stynosis  2005   esophageal stretch  2001   FOOT [place]   herniated disc  1983   left hand surgery  2011   gout   left knee replacement  2002   left knee surgery  1998   right wrist fracture  2006   SHOULDER ARTHROSCOPY WITH ROTATOR CUFF REPAIR Right 01/05/2013   Procedure: SHOULDER ARTHROSCOPY WITH ROTATOR CUFF REPAIR subchromial decompression and distal clavical excision;  Surgeon: Lorn Junes, MD;  Location: [facility];  Service: Orthopedics;  Laterality: Right;   tonsillectomy  1936   TONSILLECTOMY     TYMPANOSTOMY TUBE PLACEMENT  1989   VESICOVAGINAL FISTULA CLOSURE W/ TAH  1968     Current Meds  Medication Sig   albuterol (PROVENTIL HFA;VENTOLIN HFA) 108 (90 BASE) MCG/ACT inhaler Inhale 2 puffs into the lungs every 6 (six) hours as needed for wheezing or  shortness of breath.    albuterol (PROVENTIL) (2.5 MG/3ML) 0.083% nebulizer solution Take 2.5 mg by nebulization every 6 (six) hours as needed for wheezing or shortness of breath.   allopurinol (ZYLOPRIM) 100 MG tablet Take 100 mg by mouth daily.     calcitRIOL (ROCALTROL) 0.25 MCG capsule Take 1 capsule by mouth daily.   diclofenac sodium (VOLTAREN) 1 % GEL Apply 2 g topically daily as needed (pain).   diphenoxylate-atropine (LOMOTIL) 2.5-0.025 MG per tablet Take 1 tablet by mouth daily as needed for diarrhea or loose stools.   fexofenadine (ALLEGRA) 180 MG tablet Take 1 tablet (180 mg total) by mouth daily.    fluticasone (FLONASE) 50 MCG/ACT nasal spray Place 2 sprays into both nostrils daily.   furosemide (LASIX) 40 MG tablet TAKE 1 TABLET BY MOUTH EVERY DAY   gabapentin (NEURONTIN) 300 MG capsule Take 300 mg by mouth 3 (three) times daily.   GNP ASPIRIN LOW DOSE 81 MG EC tablet Take 81 mg by mouth daily.   HYDROcodone-acetaminophen (NORCO/VICODIN) 5-325 MG per tablet Take 0.5-1 tablets by mouth every 6 (six) hours as needed for moderate pain.    insulin glargine (LANTUS) 100 UNIT/ML injection Inject 20 Units into the skin at bedtime.    iron polysaccharides (NIFEREX) 150 MG capsule Take 150 mg by mouth daily.   JANUVIA 50 MG tablet Take 50 mg by mouth daily.   metoprolol succinate (TOPROL-XL) 25 MG 24 hr tablet Take 25 mg by mouth daily.   mometasone-formoterol (DULERA) 100-5 MCG/ACT AERO Take 2 puffs first thing in am and then another 2 puffs about 12 hours later.   montelukast (SINGULAIR) 10 MG tablet Take 10 mg by mouth at bedtime.   Multiple Vitamins-Minerals (ICAPS MV) TABS Take 1 tablet by mouth 2 (two) times daily. Reported on 11/20/2015   omeprazole (PRILOSEC) 20 MG capsule Take 20 mg by mouth at bedtime.    omeprazole (PRILOSEC) 40 MG capsule Take 30-60 min before first meal of the day   Respiratory Therapy Supplies (FLUTTER) DEVI Use as directed   tiZANidine (ZANAFLEX) 4 MG tablet Take 4 mg by mouth every 8 (eight) hours as needed for muscle spasms.    Vitamin D, Ergocalciferol, (DRISDOL) 50000 units CAPS capsule Take 50,000 Units by mouth once a week.     Allergies:   Losartan and Snake antivenin [antivenin crotalidae polyvalent (snake antivenin)]   Social History   Tobacco Use   Smoking status: Former Smoker    Packs/day: 0.50    Years: 20.00    Pack years: 10.00    Types: Cigarettes    Last attempt to quit: 10/22/1987    Years since quitting: 31.3   Smokeless tobacco: Never Used  Substance Use Topics   Alcohol use: Yes    Alcohol/week: 7.0 standard drinks      Types: 7 Glasses of wine per week    Comment: social drinker   Drug use: No     Family Hx: The patient's family history includes Diabetes in her daughter, sister, and son; Heart disease in her sister; Hypertension in her sister, sister, and son; Pneumonia in her mother.  ROS:   Please see the history of present illness.    Review of Systems  Constitution: Negative.  HENT: Negative.   Eyes: Negative.   Cardiovascular: Positive for dyspnea on exertion.  Respiratory: Negative.   Hematologic/Lymphatic: Negative.   Musculoskeletal: Positive for arthritis. Negative for joint pain.  Gastrointestinal: Negative.   Genitourinary: Negative.   Neurological: Negative.  All other systems reviewed and are negative.   Prior CV studies:   The following studies were reviewed today:  2D Echo 12/2017 Study Conclusions   - Left ventricle: The cavity size was normal. Wall thickness was   increased in a pattern of mild LVH. Systolic function was   vigorous. The estimated ejection fraction was in the range of 65%   to 70%. Wall motion was normal; there were no regional wall   motion abnormalities. Doppler parameters are consistent with   abnormal left ventricular relaxation (grade 1 diastolic   dysfunction). - Aortic valve: There was mild to moderate stenosis. Valve area   (VTI): 1.19 cm^2. Valve area (Vmax): 1.38 cm^2. Valve area   (Vmean): 1.21 cm^2. - Mitral valve: Valve area by pressure half-time: 2.2 cm^2. Valve   area by continuity equation (using LVOT flow): 1.76 cm^2.       Labs/Other Tests and Data Reviewed:    EKG:  No ECG reviewed.  Recent Labs: No results found for requested labs within last 8760 hours.   Recent Lipid Panel Lab Results  Component Value Date/Time   CHOL 162 11/05/2017 10:57 AM   TRIG 118 11/05/2017 10:57 AM   HDL 58 11/05/2017 10:57 AM   CHOLHDL 2.8 11/05/2017 10:57 AM   CHOLHDL 2.7 02/02/2014 03:55 AM   LDLCALC 80 11/05/2017 10:57 AM    Wt  Readings from Last 3 Encounters:  02/23/19 170 lb (77.1 kg)  11/25/18 176 lb 12.8 oz (80.2 kg)  08/12/18 185 lb 1.9 oz (84 kg)     Objective:    Vital Signs:  BP (!) 144/78 (BP Location: Left Arm, Patient Position: Sitting, Cuff Size: Normal)    Ht 5' (1.524 m)    Wt 170 lb (77.1 kg)    BMI 33.20 kg/m    VITAL SIGNS:  reviewed GEN:  no acute distress RESPIRATORY:  normal respiratory effort, symmetric expansion CARDIOVASCULAR:  no peripheral edema  ASSESSMENT & PLAN:    1. Chronic diastolic CHF-compensated. 2. Moderate aortic stenosis on echo 12/2017 mean gradient 17 mm meters of mercury.  Dr. Meda Coffee ordered a repeat echo to be done but it has not been completed yet because of COVID. Patient asymptomatic. Can hold off on echo until next visit. 3. Essential hypertension BP well controlled 4. PAF not anticoagulated because of history of anemia and epistasis 5. CKD baseline creatinine 1.9-2.0 6. Diabetes mellitus  COVID-19 Education: The signs and symptoms of COVID-19 were discussed with the patient and how to seek care for testing (follow up with PCP or arrange E-visit).   The importance of social distancing was discussed today.  Time:   Today, I have spent 15 minutes with the patient with telehealth technology discussing the above problems.     Medication Adjustments/Labs and Tests Ordered: Current medicines are reviewed at length with the patient today.  Concerns regarding medicines are outlined above.   Tests Ordered: Orders Placed This Encounter  Procedures   ECHOCARDIOGRAM COMPLETE    Medication Changes: No orders of the defined types were placed in this encounter.   Disposition:  Follow up in 6 month(s) Dr. Meda Coffee with 2Decho before on the same day.   Signed, Ermalinda Barrios, PA-C  02/23/2019 1:57 PM    [facility]

## 2019-02-23 ENCOUNTER — Telehealth (INDEPENDENT_AMBULATORY_CARE_PROVIDER_SITE_OTHER): Payer: Medicare Other | Admitting: Physician Assistant

## 2019-02-23 ENCOUNTER — Encounter: Payer: Self-pay | Admitting: Physician Assistant

## 2019-02-23 ENCOUNTER — Other Ambulatory Visit: Payer: Self-pay

## 2019-02-23 VITALS — BP 144/78 | Ht 60.0 in | Wt 170.0 lb

## 2019-02-23 DIAGNOSIS — I350 Nonrheumatic aortic (valve) stenosis: Secondary | ICD-10-CM | POA: Diagnosis not present

## 2019-02-23 NOTE — Patient Instructions (Signed)
Medication Instructions:  Your physician recommends that you continue on your current medications as directed. Please refer to the Current Medication list given to you today.  If you need a refill on your cardiac medications before your next appointment, please call your pharmacy.   Lab work: None  If you have labs (blood work) drawn today and your tests are completely normal, you will receive your results only by: Marland Kitchen MyChart Message (if you have MyChart) OR . A paper copy in the mail If you have any lab test that is abnormal or we need to change your treatment, we will call you to review the results.  Testing/Procedures: Your physician has requested that you have an echocardiogram in 6 months. Echocardiography is a painless test that uses sound waves to create images of your heart. It provides your doctor with information about the size and shape of your heart and how well your heart's chambers and valves are working. This procedure takes approximately one hour. There are no restrictions for this procedure.    Follow-Up: At [facility], you and your health needs are our priority.  As part of our continuing mission to provide you with exceptional heart care, we have created designated Provider Care Teams.  These Care Teams include your primary Cardiologist (physician) and Advanced Practice Providers (APPs -  Physician Assistants and Nurse Practitioners) who all work together to provide you with the care you need, when you need it. You will need a follow up appointment in 6 weeks.  Please call our office 2 months in advance to schedule this appointment.  You may see Ena Dawley, MD or one of the following Advanced Practice Providers on your designated Care Team:   [place], PA-C Melina Copa, PA-C . Ermalinda Barrios, PA-C  Any Other Special Instructions Will Be Listed Below (If Applicable).

## 2019-04-08 ENCOUNTER — Other Ambulatory Visit: Payer: Self-pay | Admitting: Cardiology

## 2019-04-08 DIAGNOSIS — I350 Nonrheumatic aortic (valve) stenosis: Secondary | ICD-10-CM

## 2019-04-08 DIAGNOSIS — I10 Essential (primary) hypertension: Secondary | ICD-10-CM

## 2019-04-08 DIAGNOSIS — I5033 Acute on chronic diastolic (congestive) heart failure: Secondary | ICD-10-CM

## 2019-04-20 DIAGNOSIS — E1129 Type 2 diabetes mellitus with other diabetic kidney complication: Secondary | ICD-10-CM | POA: Diagnosis not present

## 2019-04-20 DIAGNOSIS — D631 Anemia in chronic kidney disease: Secondary | ICD-10-CM | POA: Diagnosis not present

## 2019-04-20 DIAGNOSIS — R269 Unspecified abnormalities of gait and mobility: Secondary | ICD-10-CM | POA: Diagnosis not present

## 2019-04-20 DIAGNOSIS — I350 Nonrheumatic aortic (valve) stenosis: Secondary | ICD-10-CM | POA: Diagnosis not present

## 2019-04-20 DIAGNOSIS — E041 Nontoxic single thyroid nodule: Secondary | ICD-10-CM | POA: Diagnosis not present

## 2019-04-20 DIAGNOSIS — N184 Chronic kidney disease, stage 4 (severe): Secondary | ICD-10-CM | POA: Diagnosis not present

## 2019-04-20 DIAGNOSIS — E7849 Other hyperlipidemia: Secondary | ICD-10-CM | POA: Diagnosis not present

## 2019-05-10 ENCOUNTER — Telehealth: Payer: Self-pay | Admitting: Physician Assistant

## 2019-05-10 NOTE — Progress Notes (Signed)
Cardiology Office Note    Date:  05/11/2019   ID:  Ann Sellers, DOB 03/13/1925, MRN [id number]  PCP:  Reynold Bowen, MD  Cardiologist: Ena Dawley, MD EPS: None  Chief Complaint  Patient presents with  . Follow-up    History of Present Illness:  Ann Sellers is a 85 y.o. female with history of chronic diastolic CHF Echo 03/3845 normal LVEF grade 1 DD, moderate aortic stenosis mean gradient 17 mmHg, PAF in the setting of acute illness and prior history of anemia and epistasis so not anticoagulated, CKD baseline creatinine around 2.0, hypertension and DM.  Patient last saw Dr. Meda Coffee 11/25/2018 at which time she was doing well.  2D echo ordered because of loud murmur but it has not been completed yet.   I had telemedicine visit with the patient 02/23/2019 at which time she complained of shortness of breath if she overdoes it.  She was still cooking and doing laundry and some yard work at that time.  Patient here for f/u. She got mixed up and missed her echo appt earlier today. . Now crying and upset. Lives with her son. Has DOE but says it's expected at age 53. Had neighbors tree fall in yard and under a lot of stress with clean up. Crying and feels like everyone wants to upset her. Refusing any further workup or treatment. Says she's ready to die in her recliner.     Past Medical History:  Diagnosis Date  . Anemia   . Arthritis   . Asthma   . B12 deficiency   . Chronic diastolic CHF (congestive heart failure) ([place])   . CKD (chronic kidney disease), stage III ([place])   . Diabetes mellitus ([place])   . Hypertension   . Moderate aortic stenosis   . Neuromuscular disorder ([place])   . PAF (paroxysmal atrial fibrillation) ([place])   . Right rotator cuff tear   . Wears dentures    top denture-bottom partial  . Wears glasses   . Wears hearing aid    both ears    Past Surgical History:  Procedure Laterality Date  . APPENDECTOMY  1945  . CATARACT EXTRACTION, BILATERAL  2008  .  cervical stynosis  2005  . esophageal stretch  2001  . [place]  . herniated disc  1983  . left hand surgery  2011   gout  . left knee replacement  2002  . left knee surgery  1998  . right wrist fracture  2006  . SHOULDER ARTHROSCOPY WITH ROTATOR CUFF REPAIR Right 01/05/2013   Procedure: SHOULDER ARTHROSCOPY WITH ROTATOR CUFF REPAIR subchromial decompression and distal clavical excision;  Surgeon: Lorn Junes, MD;  Location: [facility];  Service: Orthopedics;  Laterality: Right;  . tonsillectomy  1936  . TONSILLECTOMY    . TYMPANOSTOMY TUBE PLACEMENT  1989  . VESICOVAGINAL FISTULA CLOSURE W/ TAH  1968    Current Medications: Current Meds  Medication Sig  . albuterol (PROVENTIL HFA;VENTOLIN HFA) 108 (90 BASE) MCG/ACT inhaler Inhale 2 puffs into the lungs every 6 (six) hours as needed for wheezing or shortness of breath.   Marland Kitchen albuterol (PROVENTIL) (2.5 MG/3ML) 0.083% nebulizer solution Take 2.5 mg by nebulization every 6 (six) hours as needed for wheezing or shortness of breath.  . allopurinol (ZYLOPRIM) 100 MG tablet Take 100 mg by mouth daily.    . calcitRIOL (ROCALTROL) 0.25 MCG capsule Take 1 capsule by mouth daily.  . diclofenac sodium (VOLTAREN)  1 % GEL Apply 2 g topically daily as needed (pain).  Marland Kitchen diphenoxylate-atropine (LOMOTIL) 2.5-0.025 MG per tablet Take 1 tablet by mouth daily as needed for diarrhea or loose stools.  . fexofenadine (ALLEGRA) 180 MG tablet Take 1 tablet (180 mg total) by mouth daily.  . fluticasone (FLONASE) 50 MCG/ACT nasal spray Place 2 sprays into both nostrils daily.  . furosemide (LASIX) 40 MG tablet TAKE 1 TABLET BY MOUTH EVERY DAY  . gabapentin (NEURONTIN) 300 MG capsule Take 300 mg by mouth 3 (three) times daily.  . GNP ASPIRIN LOW DOSE 81 MG EC tablet Take 81 mg by mouth daily.  Marland Kitchen HYDROcodone-acetaminophen (NORCO/VICODIN) 5-325 MG per tablet Take 0.5-1 tablets by mouth every 6 (six) hours as needed for moderate  pain.   Marland Kitchen insulin glargine (LANTUS) 100 UNIT/ML injection Inject 20 Units into the skin at bedtime.   . iron polysaccharides (NIFEREX) 150 MG capsule Take 150 mg by mouth daily.  Marland Kitchen JANUVIA 50 MG tablet Take 50 mg by mouth daily.  . metoprolol succinate (TOPROL-XL) 25 MG 24 hr tablet Take 25 mg by mouth daily.  . mometasone-formoterol (DULERA) 100-5 MCG/ACT AERO Take 2 puffs first thing in am and then another 2 puffs about 12 hours later.  . montelukast (SINGULAIR) 10 MG tablet Take 10 mg by mouth at bedtime.  . Multiple Vitamins-Minerals (ICAPS MV) TABS Take 1 tablet by mouth 2 (two) times daily. Reported on 11/20/2015  . omeprazole (PRILOSEC) 20 MG capsule Take 20 mg by mouth at bedtime.   Marland Kitchen omeprazole (PRILOSEC) 40 MG capsule Take 30-60 min before first meal of the day  . Respiratory Therapy Supplies (FLUTTER) DEVI Use as directed  . tiZANidine (ZANAFLEX) 4 MG tablet Take 4 mg by mouth every 8 (eight) hours as needed for muscle spasms.   . Vitamin D, Ergocalciferol, (DRISDOL) 50000 units CAPS capsule Take 50,000 Units by mouth once a week.     Allergies:   Losartan and Snake antivenin [antivenin crotalidae polyvalent (snake antivenin)]   Social History   Socioeconomic History  . Marital status: Widowed    Spouse name: Not on file  . Number of children: 6  . Years of education: Not on file  . Highest education level: Not on file  Occupational History  . Occupation: homemaker  Social Needs  . Financial resource strain: Not on file  . Food insecurity    Worry: Not on file    Inability: Not on file  . Transportation needs    Medical: Not on file    Non-medical: Not on file  Tobacco Use  . Smoking status: Former Smoker    Packs/day: 0.50    Years: 20.00    Pack years: 10.00    Types: Cigarettes    Quit date: 10/22/1987    Years since quitting: 31.5  . Smokeless tobacco: Never Used  Substance and Sexual Activity  . Alcohol use: Yes    Alcohol/week: 7.0 standard drinks     Types: 7 Glasses of wine per week    Comment: social drinker  . Drug use: No  . Sexual activity: Not on file  Lifestyle  . Physical activity    Days per week: Not on file    Minutes per session: Not on file  . Stress: Not on file  Relationships  . Social Herbalist on phone: Not on file    Gets together: Not on file    Attends religious service: Not on file  Active member of club or organization: Not on file    Attends meetings of clubs or organizations: Not on file    Relationship status: Not on file  Other Topics Concern  . Not on file  Social History Narrative  . Not on file     Family History:  The patient's family history includes Diabetes in her daughter, sister, and son; Heart disease in her sister; Hypertension in her sister, sister, and son; Pneumonia in her mother.   ROS:   Please see the history of present illness.    ROS All other systems reviewed and are negative.   PHYSICAL EXAM:   VS:  BP 128/86   Pulse 72   Ht 5' (1.524 m)   Wt 182 lb 12.8 oz (82.9 kg)   SpO2 90%   BMI 35.70 kg/m   Physical Exam  GEN: Well nourished, well developed, in no acute distress  Neck: Murmur portrayed in carotids no JVD, carotid bruits, or masses Cardiac:RRR;some skipping, normal S1 no S2 4/6 harsh systolic murmur at left sternal border  respiratory:  clear to auscultation bilaterally, normal work of breathing GI: soft, nontender, nondistended, + BS Ext: +1 edema bilaterally without cyanosis, clubbing, Good distal pulses bilaterally Neuro:  Alert and Oriented x 3 Psych: euthymic mood, full affect  Wt Readings from Last 3 Encounters:  05/11/19 182 lb 12.8 oz (82.9 kg)  02/23/19 170 lb (77.1 kg)  11/25/18 176 lb 12.8 oz (80.2 kg)      Studies/Labs Reviewed:   EKG:  EKG is not ordered today.   Recent Labs: No results found for requested labs within last 8760 hours.   Lipid Panel    Component Value Date/Time   CHOL 162 11/05/2017 1057   TRIG 118  11/05/2017 1057   HDL 58 11/05/2017 1057   CHOLHDL 2.8 11/05/2017 1057   CHOLHDL 2.7 02/02/2014 0355   VLDL 21 02/02/2014 0355   LDLCALC 80 11/05/2017 1057    Additional studies/ records that were reviewed today include:   2D Echo 12/2017 Study Conclusions   - Left ventricle: The cavity size was normal. Wall thickness was   increased in a pattern of mild LVH. Systolic function was   vigorous. The estimated ejection fraction was in the range of 65%   to 70%. Wall motion was normal; there were no regional wall   motion abnormalities. Doppler parameters are consistent with   abnormal left ventricular relaxation (grade 1 diastolic   dysfunction). - Aortic valve: There was mild to moderate stenosis. Valve area   (VTI): 1.19 cm^2. Valve area (Vmax): 1.38 cm^2. Valve area   (Vmean): 1.21 cm^2. - Mitral valve: Valve area by pressure half-time: 2.2 cm^2. Valve   area by continuity equation (using LVOT flow): 1.76 cm^2.          ASSESSMENT:    1. Chronic diastolic CHF (congestive heart failure) ([place])   2. Essential hypertension   3. Aortic stenosis, moderate   4. PAF (paroxysmal atrial fibrillation) ([place])   5. CKD (chronic kidney disease), stage III ([place])   6. Insulin dependent diabetes mellitus with complications (H[facility])      PLAN:  In order of problems listed above:  Chronic diastolic CHF-mild edema but overall stable.  Moderate aortic stenosis on echo 12/2017 mean gradient 17 mmHg-follow-up echo not done secondary to COVID-sounds worse but patient missed her echo appt today and is upset. Offered to do it in 1 hr but she is refusing and says she won't have  it done. I told her the symptoms to look for and asked her to call us if she wants a work up.  Essential hypertension BP stable  PAF no anticoagulation secondary to anemia and epistasis  CKD baseline creatinine 1.9 04/20/19  DM type II A1C 5.8 04/2019    Medication Adjustments/Labs and Tests Ordered: Current medicines  are reviewed at length with the patient today.  Concerns regarding medicines are outlined above.  Medication changes, Labs and Tests ordered today are listed in the Patient Instructions below. Patient Instructions  Medication Instructions:  Your physician recommends that you continue on your current medications as directed. Please refer to the Current Medication list given to you today.   If you need a refill on your cardiac medications before your next appointment, please call your pharmacy.   Lab work: None ordered  If you have labs (blood work) drawn today and your tests are completely normal, you will receive your results only by: Marland Kitchen MyChart Message (if you have MyChart) OR . A paper copy in the mail If you have any lab test that is abnormal or we need to change your treatment, we will call you to review the results.  Testing/Procedures: None ordered  Follow-Up:    Any Other Special Instructions Will Be Listed Below (If Applicable). Call our office if you would like to reschedule your echocardiogram and make an appointment with Dr. Meda Coffee      Signed, Ermalinda Barrios, PA-C  05/11/2019 12:58 PM    [address], [place], [place]  [address] Phone: [phone]; Fax: [phone]

## 2019-05-10 NOTE — Telephone Encounter (Signed)

## 2019-05-11 ENCOUNTER — Encounter: Payer: Self-pay | Admitting: Physician Assistant

## 2019-05-11 ENCOUNTER — Other Ambulatory Visit: Payer: Self-pay

## 2019-05-11 ENCOUNTER — Ambulatory Visit (HOSPITAL_COMMUNITY): Payer: Medicare Other

## 2019-05-11 ENCOUNTER — Ambulatory Visit (INDEPENDENT_AMBULATORY_CARE_PROVIDER_SITE_OTHER): Payer: Medicare Other | Admitting: Physician Assistant

## 2019-05-11 ENCOUNTER — Ambulatory Visit (HOSPITAL_COMMUNITY): Payer: Medicare Other | Attending: Physician Assistant

## 2019-05-11 ENCOUNTER — Other Ambulatory Visit (HOSPITAL_COMMUNITY): Payer: Medicare Other

## 2019-05-11 VITALS — BP 128/86 | HR 72 | Ht 60.0 in | Wt 182.8 lb

## 2019-05-11 DIAGNOSIS — E119 Type 2 diabetes mellitus without complications: Secondary | ICD-10-CM | POA: Insufficient documentation

## 2019-05-11 DIAGNOSIS — N183 Chronic kidney disease, stage 3 unspecified: Secondary | ICD-10-CM

## 2019-05-11 DIAGNOSIS — Z87891 Personal history of nicotine dependence: Secondary | ICD-10-CM | POA: Diagnosis not present

## 2019-05-11 DIAGNOSIS — I350 Nonrheumatic aortic (valve) stenosis: Secondary | ICD-10-CM | POA: Insufficient documentation

## 2019-05-11 DIAGNOSIS — I480 Paroxysmal atrial fibrillation: Secondary | ICD-10-CM | POA: Diagnosis not present

## 2019-05-11 DIAGNOSIS — I5032 Chronic diastolic (congestive) heart failure: Secondary | ICD-10-CM | POA: Diagnosis not present

## 2019-05-11 DIAGNOSIS — E118 Type 2 diabetes mellitus with unspecified complications: Secondary | ICD-10-CM

## 2019-05-11 DIAGNOSIS — I110 Hypertensive heart disease with heart failure: Secondary | ICD-10-CM | POA: Diagnosis not present

## 2019-05-11 DIAGNOSIS — I10 Essential (primary) hypertension: Secondary | ICD-10-CM

## 2019-05-11 DIAGNOSIS — I4891 Unspecified atrial fibrillation: Secondary | ICD-10-CM | POA: Diagnosis not present

## 2019-05-11 DIAGNOSIS — Z794 Long term (current) use of insulin: Secondary | ICD-10-CM

## 2019-05-11 DIAGNOSIS — IMO0001 Reserved for inherently not codable concepts without codable children: Secondary | ICD-10-CM

## 2019-05-11 LAB — ECHOCARDIOGRAM COMPLETE
Height: 60 in
Weight: 2924.8 oz

## 2019-05-11 NOTE — Patient Instructions (Signed)
Medication Instructions:  Your physician recommends that you continue on your current medications as directed. Please refer to the Current Medication list given to you today.   If you need a refill on your cardiac medications before your next appointment, please call your pharmacy.   Lab work: None ordered  If you have labs (blood work) drawn today and your tests are completely normal, you will receive your results only by: Marland Kitchen MyChart Message (if you have MyChart) OR . A paper copy in the mail If you have any lab test that is abnormal or we need to change your treatment, we will call you to review the results.  Testing/Procedures: None ordered  Follow-Up:    Any Other Special Instructions Will Be Listed Below (If Applicable). Call our office if you would like to reschedule your echocardiogram and make an appointment with Dr. Meda Coffee

## 2019-07-08 DIAGNOSIS — Z23 Encounter for immunization: Secondary | ICD-10-CM | POA: Diagnosis not present

## 2019-07-29 DIAGNOSIS — Z961 Presence of intraocular lens: Secondary | ICD-10-CM | POA: Diagnosis not present

## 2019-07-29 DIAGNOSIS — H353132 Nonexudative age-related macular degeneration, bilateral, intermediate dry stage: Secondary | ICD-10-CM | POA: Diagnosis not present

## 2019-07-29 DIAGNOSIS — E109 Type 1 diabetes mellitus without complications: Secondary | ICD-10-CM | POA: Diagnosis not present

## 2019-07-29 DIAGNOSIS — I10 Essential (primary) hypertension: Secondary | ICD-10-CM | POA: Diagnosis not present

## 2019-07-29 DIAGNOSIS — H5213 Myopia, bilateral: Secondary | ICD-10-CM | POA: Diagnosis not present

## 2019-08-11 DIAGNOSIS — E1129 Type 2 diabetes mellitus with other diabetic kidney complication: Secondary | ICD-10-CM | POA: Diagnosis not present

## 2019-08-11 DIAGNOSIS — N184 Chronic kidney disease, stage 4 (severe): Secondary | ICD-10-CM | POA: Diagnosis not present

## 2019-08-11 DIAGNOSIS — D631 Anemia in chronic kidney disease: Secondary | ICD-10-CM | POA: Diagnosis not present

## 2019-08-11 DIAGNOSIS — R269 Unspecified abnormalities of gait and mobility: Secondary | ICD-10-CM | POA: Diagnosis not present

## 2019-08-16 ENCOUNTER — Encounter: Payer: Self-pay | Admitting: Cardiology

## 2019-08-16 ENCOUNTER — Other Ambulatory Visit: Payer: Self-pay

## 2019-08-16 ENCOUNTER — Ambulatory Visit: Payer: Medicare Other | Admitting: Cardiology

## 2019-08-16 VITALS — BP 171/75 | HR 78 | Ht 59.0 in | Wt 179.0 lb

## 2019-08-16 DIAGNOSIS — I350 Nonrheumatic aortic (valve) stenosis: Secondary | ICD-10-CM

## 2019-08-16 DIAGNOSIS — I5032 Chronic diastolic (congestive) heart failure: Secondary | ICD-10-CM | POA: Diagnosis not present

## 2019-08-16 DIAGNOSIS — I10 Essential (primary) hypertension: Secondary | ICD-10-CM | POA: Diagnosis not present

## 2019-08-16 NOTE — Patient Instructions (Signed)
Medication Instructions:   Your physician recommends that you continue on your current medications as directed. Please refer to the Current Medication list given to you today.  *If you need a refill on your cardiac medications before your next appointment, please call your pharmacy*   Lab Work:  TODAY--CMET, CBC W DIFF, AND TSH  If you have labs (blood work) drawn today and your tests are completely normal, you will receive your results only by: Marland Kitchen MyChart Message (if you have MyChart) OR . A paper copy in the mail If you have any lab test that is abnormal or we need to change your treatment, we will call you to review the results.    Follow-Up: At [facility], you and your health needs are our priority.  As part of our continuing mission to provide you with exceptional heart care, we have created designated Provider Care Teams.  These Care Teams include your primary Cardiologist (physician) and Advanced Practice Providers (APPs -  Physician Assistants and Nurse Practitioners) who all work together to provide you with the care you need, when you need it.  Your next appointment:    WITH DR. Meda Coffee IN THE OFFICE ON November 17, 2019 AT 8:40 AM  The format for your next appointment:   In Person  Provider:   Ena Dawley, MD  Other Instructions  DR. NELSON WOULD LIKE FOR YOU TO CHECK YOUR BP DAILY AND WRITE THESE NUMBERS DOWN FOR A WEEK OR SO, THEN CALL THESE INTO THE OFFICE AT [phone]

## 2019-08-16 NOTE — Progress Notes (Signed)
08/16/2019 Ann Sellers   July 10, 1925  [id number]  Primary Physician Reynold Bowen, MD Primary Cardiologist: Dr. Meda Coffee   Reason for Visit/CC: f/u for Chronic Diastolic HF and Aortic Stenosis.  3 months follow-up  HPI: Ann Sellers is a 85 y.o. female (appearing younger than stated age) with history of CKD III, DM, HTN, anemia, B12 deficiency, asthma, aortic stenosis and chronic diastolic CHF who presents to clinic today for f/u of CHF. Per rview of chart, she had elevated troponin of 0.25 while in the hospital in November 2017 during an episode of pneumonia and acute on chronic kidney insufficiency. This was felt to be due to demand ischemia. She also had paroxysms of atrial fib felt possibly due to her acute illness. She was not anticoagulated secondary to a history of recurrent epistaxis and anemia. She had a 2D Echo 09/14/16 that showed mild LVH, EF 65-70%, grade 1 DD, moderate aortic stenosis, mild MR/TR. She has required diuretics for her diastolic HF. She is followed by Dr. Meda Coffee. Last OV was 10/2017. She was in NSR at last OV and was felt to be euvolemic from a volume standpoint. Dr. Meda Coffee ordered for a repeat echo to reevaluate her aortic stenosis. This was done 12/31/17 and showed stable modereate AS, mean gradietn 17 mm Hg. EF was normal. G1DD noted.   08/16/2019, the patient is coming after 3 months, she continues to be independent, she lives at home with her son, she is able to do laundry ", she walks with a walker, she has mild shortness of breath and some dizziness but it has been stable in the last year.  She denies any fall or chest pain.  Cardiac Studies  2D Echo 12/2017 Study Conclusions  - Left ventricle: The cavity size was normal. Wall thickness was   increased in a pattern of mild LVH. Systolic function was   vigorous. The estimated ejection fraction was in the range of 65%   to 70%. Wall motion was normal; there were no regional wall   motion abnormalities. Doppler  parameters are consistent with   abnormal left ventricular relaxation (grade 1 diastolic   dysfunction). - Aortic valve: There was mild to moderate stenosis. Valve area   (VTI): 1.19 cm^2. Valve area (Vmax): 1.38 cm^2. Valve area   (Vmean): 1.21 cm^2. - Mitral valve: Valve area by pressure half-time: 2.2 cm^2. Valve   area by continuity equation (using LVOT flow): 1.76 cm^2.   Current Meds  Medication Sig  . albuterol (PROVENTIL HFA;VENTOLIN HFA) 108 (90 BASE) MCG/ACT inhaler Inhale 2 puffs into the lungs every 6 (six) hours as needed for wheezing or shortness of breath.   Marland Kitchen albuterol (PROVENTIL) (2.5 MG/3ML) 0.083% nebulizer solution Take 2.5 mg by nebulization every 6 (six) hours as needed for wheezing or shortness of breath.  . allopurinol (ZYLOPRIM) 100 MG tablet Take 100 mg by mouth daily.    . calcitRIOL (ROCALTROL) 0.25 MCG capsule Take 1 capsule by mouth daily.  . diclofenac sodium (VOLTAREN) 1 % GEL Apply 2 g topically daily as needed (pain).  Marland Kitchen diphenoxylate-atropine (LOMOTIL) 2.5-0.025 MG per tablet Take 1 tablet by mouth daily as needed for diarrhea or loose stools.  . fexofenadine (ALLEGRA) 180 MG tablet Take 1 tablet (180 mg total) by mouth daily.  . fluticasone (FLONASE) 50 MCG/ACT nasal spray Place 2 sprays into both nostrils daily.  . furosemide (LASIX) 40 MG tablet TAKE 1 TABLET BY MOUTH EVERY DAY  . gabapentin (NEURONTIN) 300 MG capsule Take  300 mg by mouth 3 (three) times daily.  . GNP ASPIRIN LOW DOSE 81 MG EC tablet Take 81 mg by mouth daily.  Marland Kitchen HYDROcodone-acetaminophen (NORCO/VICODIN) 5-325 MG per tablet Take 0.5-1 tablets by mouth every 6 (six) hours as needed for moderate pain.   Marland Kitchen insulin glargine (LANTUS) 100 UNIT/ML injection Inject 20 Units into the skin at bedtime.   . iron polysaccharides (NIFEREX) 150 MG capsule Take 150 mg by mouth daily.  Marland Kitchen JANUVIA 50 MG tablet Take 50 mg by mouth daily.  . metoprolol succinate (TOPROL-XL) 25 MG 24 hr tablet Take 25 mg  by mouth daily.  . mometasone-formoterol (DULERA) 100-5 MCG/ACT AERO Take 2 puffs first thing in am and then another 2 puffs about 12 hours later.  . montelukast (SINGULAIR) 10 MG tablet Take 10 mg by mouth at bedtime.  . Multiple Vitamins-Minerals (ICAPS MV) TABS Take 1 tablet by mouth 2 (two) times daily. Reported on 11/20/2015  . omeprazole (PRILOSEC) 20 MG capsule Take 20 mg by mouth at bedtime.   Marland Kitchen omeprazole (PRILOSEC) 40 MG capsule Take 30-60 min before first meal of the day  . tiZANidine (ZANAFLEX) 4 MG tablet Take 4 mg by mouth every 8 (eight) hours as needed for muscle spasms.   . Vitamin D, Ergocalciferol, (DRISDOL) 50000 units CAPS capsule Take 50,000 Units by mouth once a week.   Allergies  Allergen Reactions  . Losartan Cough  . Snake Antivenin [Antivenin Crotalidae Polyvalent (Snake Antivenin)] Other (See Comments)    PARALYSIS   Past Medical History:  Diagnosis Date  . Anemia   . Arthritis   . Asthma   . B12 deficiency   . Chronic diastolic CHF (congestive heart failure) ([place])   . CKD (chronic kidney disease), stage III   . Diabetes mellitus ([place])   . Hypertension   . Moderate aortic stenosis   . Neuromuscular disorder ([place])   . PAF (paroxysmal atrial fibrillation) ([place])   . Right rotator cuff tear   . Wears dentures    top denture-bottom partial  . Wears glasses   . Wears hearing aid    both ears   Family History  Problem Relation Age of Onset  . Pneumonia Mother   . Diabetes Son   . Hypertension Son   . Heart disease Sister   . Hypertension Sister   . Diabetes Sister   . Hypertension Sister   . Diabetes Daughter    Past Surgical History:  Procedure Laterality Date  . APPENDECTOMY  1945  . CATARACT EXTRACTION, BILATERAL  2008  . cervical stynosis  2005  . esophageal stretch  2001  . [place]  . herniated disc  1983  . left hand surgery  2011   gout  . left knee replacement  2002  . left knee surgery  1998  . right wrist  fracture  2006  . SHOULDER ARTHROSCOPY WITH ROTATOR CUFF REPAIR Right 01/05/2013   Procedure: SHOULDER ARTHROSCOPY WITH ROTATOR CUFF REPAIR subchromial decompression and distal clavical excision;  Surgeon: Lorn Junes, MD;  Location: [facility];  Service: Orthopedics;  Laterality: Right;  . tonsillectomy  1936  . TONSILLECTOMY    . TYMPANOSTOMY TUBE PLACEMENT  1989  . VESICOVAGINAL FISTULA CLOSURE W/ TAH  1968   Social History   Socioeconomic History  . Marital status: Widowed    Spouse name: Not on file  . Number of children: 6  . Years of education: Not on file  .  Highest education level: Not on file  Occupational History  . Occupation: homemaker  Social Needs  . Financial resource strain: Not on file  . Food insecurity    Worry: Not on file    Inability: Not on file  . Transportation needs    Medical: Not on file    Non-medical: Not on file  Tobacco Use  . Smoking status: Former Smoker    Packs/day: 0.50    Years: 20.00    Pack years: 10.00    Types: Cigarettes    Quit date: 10/22/1987    Years since quitting: 31.8  . Smokeless tobacco: Never Used  Substance and Sexual Activity  . Alcohol use: Yes    Alcohol/week: 7.0 standard drinks    Types: 7 Glasses of wine per week    Comment: social drinker  . Drug use: No  . Sexual activity: Not on file  Lifestyle  . Physical activity    Days per week: Not on file    Minutes per session: Not on file  . Stress: Not on file  Relationships  . Social Herbalist on phone: Not on file    Gets together: Not on file    Attends religious service: Not on file    Active member of club or organization: Not on file    Attends meetings of clubs or organizations: Not on file    Relationship status: Not on file  . Intimate partner violence    Fear of current or ex partner: Not on file    Emotionally abused: Not on file    Physically abused: Not on file    Forced sexual activity: Not on file  Other Topics  Concern  . Not on file  Social History Narrative  . Not on file     Review of Systems: General: negative for chills, fever, night sweats or weight changes.  Cardiovascular: negative for chest pain, dyspnea on exertion, edema, orthopnea, palpitations, paroxysmal nocturnal dyspnea or shortness of breath Dermatological: negative for rash Respiratory: negative for cough or wheezing Urologic: negative for hematuria Abdominal: negative for nausea, vomiting, diarrhea, bright red blood per rectum, melena, or hematemesis Neurologic: negative for visual changes, syncope, or dizziness All other systems reviewed and are otherwise negative except as noted above.   Physical Exam:  Blood pressure (!) 171/75, pulse 78, height 4\' 11"  (1.499 m), weight 179 lb (81.2 kg).  General appearance: alert, cooperative, no distress and elderly WF, ambulating w/ roling walker Neck: no carotid bruit and no JVD Lungs: clear to auscultation bilaterally Heart: regular rate and rhythm, S1, no S2, late peaking holosystolic 5 out of 6 murmur. Extremities: extremities normal, atraumatic, no cyanosis or edema Pulses: 2+ and symmetric Skin: Skin color, texture, turgor normal. No rashes or lesions Neurologic: Grossly normal  EKG  Performed, shows normal sinus rhythm, right bundle branch block with left anterior fascicular block, this is unchanged from prior- personally reviewed    ASSESSMENT AND PLAN:   1. Chronic Diastolic HF: EF normal. euvolemic on exam.  She does not have signs of fluid overload, will continue to same management and check her creatinine prior to next visit.  2. Aortic Stenosis: Severe on most recent echocardiogram in July 2020 with mean gradient of 38, today she has late peaking holosystolic murmur 5 out of 6 with no S2 consistent with severe aortic stenosis.  Her symptoms are minimal, we will follow her in 3 months and obtain echo prior to the next visit  and refer for TAVR if she has worsening  symptoms..  3. HTN: Elevated today however she has not taken her medications yet she is advised to blood pressure diary and report them to [place].  4. PAF: In sinus rhythm today, she is not on anticoagulation secondary to prior history of anemia and epistaxis.  5. DM: management per PCP.   6. CKD: baseline SCr ~1.9-2.0.  We will obtain labs from her PCP a week ago.  Follow-Up: In 3 months  Ena Dawley, MD [facility] 08/16/2019 12:46 PM

## 2019-08-17 LAB — COMPREHENSIVE METABOLIC PANEL
ALT: 6 IU/L (ref 0–32)
AST: 11 IU/L (ref 0–40)
Albumin/Globulin Ratio: 2 (ref 1.2–2.2)
Albumin: 4.1 g/dL (ref 3.5–4.6)
Alkaline Phosphatase: 92 IU/L (ref 39–117)
BUN/Creatinine Ratio: 17 (ref 12–28)
BUN: 32 mg/dL (ref 10–36)
Bilirubin Total: 0.5 mg/dL (ref 0.0–1.2)
CO2: 24 mmol/L (ref 20–29)
Calcium: 8.6 mg/dL — ABNORMAL LOW (ref 8.7–10.3)
Chloride: 106 mmol/L (ref 96–106)
Creatinine, Ser: 1.87 mg/dL — ABNORMAL HIGH (ref 0.57–1.00)
GFR calc Af Amer: 27 mL/min/{1.73_m2} — ABNORMAL LOW (ref >59)
GFR calc non Af Amer: 23 mL/min/{1.73_m2} — ABNORMAL LOW (ref >59)
Globulin, Total: 2.1 g/dL (ref 1.5–4.5)
Glucose: 88 mg/dL (ref 65–99)
Potassium: 5.5 mmol/L — ABNORMAL HIGH (ref 3.5–5.2)
Sodium: 146 mmol/L — ABNORMAL HIGH (ref 134–144)
Total Protein: 6.2 g/dL (ref 6.0–8.5)

## 2019-08-17 LAB — CBC WITH DIFFERENTIAL/PLATELET
Basophils Absolute: 0 10*3/uL (ref 0.0–0.2)
Basos: 1 %
EOS (ABSOLUTE): 0.1 10*3/uL (ref 0.0–0.4)
Eos: 2 %
Hematocrit: 33.8 % — ABNORMAL LOW (ref 34.0–46.6)
Hemoglobin: 11.1 g/dL (ref 11.1–15.9)
Immature Grans (Abs): 0 10*3/uL (ref 0.0–0.1)
Immature Granulocytes: 0 %
Lymphocytes Absolute: 1.2 10*3/uL (ref 0.7–3.1)
Lymphs: 19 %
MCH: 30.5 pg (ref 26.6–33.0)
MCHC: 32.8 g/dL (ref 31.5–35.7)
MCV: 93 fL (ref 79–97)
Monocytes Absolute: 0.3 10*3/uL (ref 0.1–0.9)
Monocytes: 6 %
Neutrophils Absolute: 4.5 10*3/uL (ref 1.4–7.0)
Neutrophils: 72 %
Platelets: 227 10*3/uL (ref 150–450)
RBC: 3.64 x10E6/uL — ABNORMAL LOW (ref 3.77–5.28)
RDW: 14.3 % (ref 11.7–15.4)
WBC: 6.1 10*3/uL (ref 3.4–10.8)

## 2019-08-17 LAB — TSH: TSH: 2.38 u[IU]/mL (ref 0.450–4.500)

## 2019-08-24 ENCOUNTER — Telehealth: Payer: Self-pay | Admitting: Cardiology

## 2019-08-24 ENCOUNTER — Other Ambulatory Visit: Payer: Self-pay | Admitting: Cardiology

## 2019-08-24 MED ORDER — METOPROLOL SUCCINATE ER 50 MG PO TB24: 50.0000 mg | ORAL_TABLET | Freq: Every day | ORAL | 1 refills | Status: DC

## 2019-08-24 NOTE — Telephone Encounter (Signed)
Dr. Meda Coffee, pt is calling to report her BP readings as you requested at her last OV with you. Please review and advise as needed.

## 2019-08-24 NOTE — Telephone Encounter (Signed)
° °  1. What are your last 5 BP readings?  Tues 145/78  Wed 163/86 Thurs 166/80 Fri 145/65 Tues 145/75   2. Are you having any other symptoms (ex. Dizziness, headache, blurred vision, passed out)? No more than usual  3. What is your BP issue? Patient was told by Dr. Meda Coffee to record her BP readings at her last appt

## 2019-08-24 NOTE — Telephone Encounter (Signed)
Spoke with the pt and informed her that per Dr. Meda Coffee, she reviewed her readings, and recommends that she increase her Toprol XL to 50 mg po daily, and send [place] some more BP readings, along with her HR readings in 1-2 weeks, so that Dr Meda Coffee can further review and advise on.  Reiterated to the pt that she needs to include the HR with her BP recordings.  Confirmed the pharmacy of choice with the pt.  Pt verbalized understanding and agrees with this plan.

## 2019-08-24 NOTE — Telephone Encounter (Signed)
Please have her increase Toprol-XL to 50 mg daily and send [place] new measurements if possible also heart rates. Thank you, [place] Siren

## 2019-09-20 DIAGNOSIS — L308 Other specified dermatitis: Secondary | ICD-10-CM | POA: Diagnosis not present

## 2019-09-20 DIAGNOSIS — Z08 Encounter for follow-up examination after completed treatment for malignant neoplasm: Secondary | ICD-10-CM | POA: Diagnosis not present

## 2019-09-20 DIAGNOSIS — Z85820 Personal history of malignant melanoma of skin: Secondary | ICD-10-CM | POA: Diagnosis not present

## 2019-10-27 DIAGNOSIS — L720 Epidermal cyst: Secondary | ICD-10-CM | POA: Diagnosis not present

## 2019-11-05 DIAGNOSIS — R0602 Shortness of breath: Secondary | ICD-10-CM | POA: Diagnosis not present

## 2019-11-05 DIAGNOSIS — R05 Cough: Secondary | ICD-10-CM | POA: Diagnosis not present

## 2019-11-05 DIAGNOSIS — R197 Diarrhea, unspecified: Secondary | ICD-10-CM | POA: Diagnosis not present

## 2019-11-05 DIAGNOSIS — R112 Nausea with vomiting, unspecified: Secondary | ICD-10-CM | POA: Diagnosis not present

## 2019-11-17 ENCOUNTER — Other Ambulatory Visit: Payer: Self-pay

## 2019-11-17 ENCOUNTER — Encounter: Payer: Self-pay | Admitting: Cardiology

## 2019-11-17 ENCOUNTER — Encounter (INDEPENDENT_AMBULATORY_CARE_PROVIDER_SITE_OTHER): Payer: Self-pay

## 2019-11-17 ENCOUNTER — Telehealth: Payer: Self-pay | Admitting: *Deleted

## 2019-11-17 ENCOUNTER — Ambulatory Visit: Payer: Medicare Other | Admitting: Cardiology

## 2019-11-17 VITALS — BP 144/72 | HR 68 | Ht 60.0 in | Wt 161.0 lb

## 2019-11-17 DIAGNOSIS — I350 Nonrheumatic aortic (valve) stenosis: Secondary | ICD-10-CM

## 2019-11-17 DIAGNOSIS — I5032 Chronic diastolic (congestive) heart failure: Secondary | ICD-10-CM

## 2019-11-17 MED ORDER — CALCIUM ACETATE (PHOS BINDER) 667 MG PO CAPS: 667.0000 mg | ORAL_CAPSULE | Freq: Three times a day (TID) | ORAL | 1 refills | Status: DC

## 2019-11-17 NOTE — Telephone Encounter (Signed)
Spoke to the pt and informed her that Dr. Meda Coffee reviewed her labs, and based on her kidney function and low calcium level, she will need to HOLD lasix for now, and start new medication PhosLo 667 mg po tid, and I will forward her result to Dr. Posey Pronto (Nephrologist) for approval of these orders and further follow-up.  Confirmed the pharmacy of choice with the pt.  Pt verbalized understanding and agrees with this plan.

## 2019-11-17 NOTE — Telephone Encounter (Signed)
-----   Message from Dorothy Spark, MD sent at 11/17/2019  5:04 PM EST ----- Please hold her Lasix for now, please start her on PhosLo 667 mg 3 times daily, please send her results to her nephrologist Dr. Posey Pronto for approval of these orders.  BNP is still pending.

## 2019-11-17 NOTE — Patient Instructions (Addendum)
Medication Instructions:   Your physician recommends that you continue on your current medications as directed. Please refer to the Current Medication list given to you today.  *If you need a refill on your cardiac medications before your next appointment, please call your pharmacy*   Lab Work:  TODAY--CMET, PRO-BNP, AND CBC  If you have labs (blood work) drawn today and your tests are completely normal, you will receive your results only by: Marland Kitchen MyChart Message (if you have MyChart) OR . A paper copy in the mail If you have any lab test that is abnormal or we need to change your treatment, we will call you to review the results.    Follow-Up:  3 MONTHS IN THE OFFICE WITH DR. Meda Coffee ON February 03, 2020 AT 8:40 AM

## 2019-11-17 NOTE — Progress Notes (Signed)
11/17/2019 Ann Sellers   Jul 19, 1925  [id number]  Primary Physician Reynold Bowen, MD Primary Cardiologist: Dr. Meda Coffee   Reason for Visit/CC: f/u for Chronic Diastolic HF and Aortic Stenosis.  3 months follow-up  HPI: Ann Sellers is a 86 y.o. female (appearing younger than stated age) with history of CKD III, DM, HTN, anemia, B12 deficiency, asthma, aortic stenosis and chronic diastolic CHF who presents to clinic today for f/u of CHF. Per rview of chart, she had elevated troponin of 0.25 while in the hospital in November 2017 during an episode of pneumonia and acute on chronic kidney insufficiency. This was felt to be due to demand ischemia. She also had paroxysms of atrial fib felt possibly due to her acute illness. She was not anticoagulated secondary to a history of recurrent epistaxis and anemia. She had a 2D Echo 09/14/16 that showed mild LVH, EF 65-70%, grade 1 DD, moderate aortic stenosis, mild MR/TR. She has required diuretics for her diastolic HF. She is followed by Dr. Meda Coffee. Last OV was 10/2017. She was in NSR at last OV and was felt to be euvolemic from a volume standpoint. Dr. Meda Coffee ordered for a repeat echo to reevaluate her aortic stenosis. This was done 12/31/17 and showed stable modereate AS, mean gradietn 17 mm Hg. EF was normal. G1DD noted.   08/16/2019, the patient is coming after 3 months, she continues to be independent, she lives at home with her son, she is able to do laundry ", she walks with a walker, she has mild shortness of breath and some dizziness but it has been stable in the last year.  She denies any fall or chest pain.  11/17/2019 -patient is coming after 3 months, she looks great, she continues to be fully independent, her symptoms are unchanged she walks with a walker but is able to do all activities of daily living with no significant chest pain or shortness of breath.  She denies any orthostatic hypotension.  She had an signout that patients can see there now  episode of fall in November when she slipped on wet surface.  She underwent head CT that did not show any intracranial bleed.  Cardiac Studies  2D Echo 12/2017 Study Conclusions  - Left ventricle: The cavity size was normal. Wall thickness was   increased in a pattern of mild LVH. Systolic function was   vigorous. The estimated ejection fraction was in the range of 65%   to 70%. Wall motion was normal; there were no regional wall   motion abnormalities. Doppler parameters are consistent with   abnormal left ventricular relaxation (grade 1 diastolic   dysfunction). - Aortic valve: There was mild to moderate stenosis. Valve area   (VTI): 1.19 cm^2. Valve area (Vmax): 1.38 cm^2. Valve area   (Vmean): 1.21 cm^2. - Mitral valve: Valve area by pressure half-time: 2.2 cm^2. Valve   area by continuity equation (using LVOT flow): 1.76 cm^2.   Current Meds  Medication Sig  . albuterol (PROVENTIL HFA;VENTOLIN HFA) 108 (90 BASE) MCG/ACT inhaler Inhale 2 puffs into the lungs every 6 (six) hours as needed for wheezing or shortness of breath.   Marland Kitchen albuterol (PROVENTIL) (2.5 MG/3ML) 0.083% nebulizer solution Take 2.5 mg by nebulization every 6 (six) hours as needed for wheezing or shortness of breath.  . allopurinol (ZYLOPRIM) 100 MG tablet Take 100 mg by mouth daily.    . calcitRIOL (ROCALTROL) 0.25 MCG capsule Take 1 capsule by mouth daily.  . diclofenac sodium (VOLTAREN)  1 % GEL Apply 2 g topically daily as needed (pain).  Marland Kitchen diphenoxylate-atropine (LOMOTIL) 2.5-0.025 MG per tablet Take 1 tablet by mouth daily as needed for diarrhea or loose stools.  . fexofenadine (ALLEGRA) 180 MG tablet Take 1 tablet (180 mg total) by mouth daily.  . fluticasone (FLONASE) 50 MCG/ACT nasal spray Place 2 sprays into both nostrils daily.  . furosemide (LASIX) 40 MG tablet TAKE 1 TABLET BY MOUTH EVERY DAY  . gabapentin (NEURONTIN) 300 MG capsule Take 300 mg by mouth 3 (three) times daily.  . GNP ASPIRIN LOW DOSE 81  MG EC tablet Take 81 mg by mouth daily.  Marland Kitchen HYDROcodone-acetaminophen (NORCO/VICODIN) 5-325 MG per tablet Take 0.5-1 tablets by mouth every 6 (six) hours as needed for moderate pain.   Marland Kitchen insulin glargine (LANTUS) 100 UNIT/ML injection Inject 20 Units into the skin at bedtime.   . iron polysaccharides (NIFEREX) 150 MG capsule Take 150 mg by mouth daily.  Marland Kitchen JANUVIA 50 MG tablet Take 50 mg by mouth daily.  . metoprolol succinate (TOPROL-XL) 50 MG 24 hr tablet TAKE 1 TABLET BY MOUTH EVERY DAY WITH FOOD OR IMMEDIATELY AFTER EATING  . mometasone-formoterol (DULERA) 100-5 MCG/ACT AERO Take 2 puffs first thing in am and then another 2 puffs about 12 hours later.  . montelukast (SINGULAIR) 10 MG tablet Take 10 mg by mouth at bedtime.  . Multiple Vitamins-Minerals (ICAPS MV) TABS Take 1 tablet by mouth 2 (two) times daily. Reported on 11/20/2015  . omeprazole (PRILOSEC) 20 MG capsule Take 20 mg by mouth at bedtime.   Marland Kitchen omeprazole (PRILOSEC) 40 MG capsule Take 30-60 min before first meal of the day  . tiZANidine (ZANAFLEX) 4 MG tablet Take 4 mg by mouth every 8 (eight) hours as needed for muscle spasms.   . Vitamin D, Ergocalciferol, (DRISDOL) 50000 units CAPS capsule Take 50,000 Units by mouth once a week.   Allergies  Allergen Reactions  . Losartan Cough  . Snake Antivenin [Antivenin Crotalidae Polyvalent (Snake Antivenin)] Other (See Comments)    PARALYSIS   Past Medical History:  Diagnosis Date  . Anemia   . Arthritis   . Asthma   . B12 deficiency   . Chronic diastolic CHF (congestive heart failure) ([place])   . CKD (chronic kidney disease), stage III   . Diabetes mellitus ([place])   . Hypertension   . Moderate aortic stenosis   . Neuromuscular disorder ([place])   . PAF (paroxysmal atrial fibrillation) ([place])   . Right rotator cuff tear   . Wears dentures    top denture-bottom partial  . Wears glasses   . Wears hearing aid    both ears   Family History  Problem Relation Age of Onset  .  Pneumonia Mother   . Diabetes Son   . Hypertension Son   . Heart disease Sister   . Hypertension Sister   . Diabetes Sister   . Hypertension Sister   . Diabetes Daughter    Past Surgical History:  Procedure Laterality Date  . APPENDECTOMY  1945  . CATARACT EXTRACTION, BILATERAL  2008  . cervical stynosis  2005  . esophageal stretch  2001  . [place]  . herniated disc  1983  . left hand surgery  2011   gout  . left knee replacement  2002  . left knee surgery  1998  . right wrist fracture  2006  . SHOULDER ARTHROSCOPY WITH ROTATOR CUFF REPAIR Right 01/05/2013   Procedure:  SHOULDER ARTHROSCOPY WITH ROTATOR CUFF REPAIR subchromial decompression and distal clavical excision;  Surgeon: Lorn Junes, MD;  Location: [facility];  Service: Orthopedics;  Laterality: Right;  . tonsillectomy  1936  . TONSILLECTOMY    . TYMPANOSTOMY TUBE PLACEMENT  1989  . VESICOVAGINAL FISTULA CLOSURE W/ TAH  1968   Social History   Socioeconomic History  . Marital status: Widowed    Spouse name: Not on file  . Number of children: 6  . Years of education: Not on file  . Highest education level: Not on file  Occupational History  . Occupation: homemaker  Tobacco Use  . Smoking status: Former Smoker    Packs/day: 0.50    Years: 20.00    Pack years: 10.00    Types: Cigarettes    Quit date: 10/22/1987    Years since quitting: 32.0  . Smokeless tobacco: Never Used  Substance and Sexual Activity  . Alcohol use: Yes    Alcohol/week: 7.0 standard drinks    Types: 7 Glasses of wine per week    Comment: social drinker  . Drug use: No  . Sexual activity: Not on file  Other Topics Concern  . Not on file  Social History Narrative  . Not on file   Social Determinants of Health   Financial Resource Strain:   . Difficulty of Paying Living Expenses: Not on file  Food Insecurity:   . Worried About Charity fundraiser in the Last Year: Not on file  . Ran Out of Food  in the Last Year: Not on file  Transportation Needs:   . Lack of Transportation (Medical): Not on file  . Lack of Transportation (Non-Medical): Not on file  Physical Activity:   . Days of Exercise per Week: Not on file  . Minutes of Exercise per Session: Not on file  Stress:   . Feeling of Stress : Not on file  Social Connections:   . Frequency of Communication with Friends and Family: Not on file  . Frequency of Social Gatherings with Friends and Family: Not on file  . Attends Religious Services: Not on file  . Active Member of Clubs or Organizations: Not on file  . Attends Archivist Meetings: Not on file  . Marital Status: Not on file  Intimate Partner Violence:   . Fear of Current or Ex-Partner: Not on file  . Emotionally Abused: Not on file  . Physically Abused: Not on file  . Sexually Abused: Not on file     Review of Systems: General: negative for chills, fever, night sweats or weight changes.  Cardiovascular: negative for chest pain, dyspnea on exertion, edema, orthopnea, palpitations, paroxysmal nocturnal dyspnea or shortness of breath Dermatological: negative for rash Respiratory: negative for cough or wheezing Urologic: negative for hematuria Abdominal: negative for nausea, vomiting, diarrhea, bright red blood per rectum, melena, or hematemesis Neurologic: negative for visual changes, syncope, or dizziness All other systems reviewed and are otherwise negative except as noted above.   Physical Exam:  Blood pressure (!) 144/72, pulse 68, height 5' (1.524 m), weight 161 lb (73 kg), SpO2 99 %.  General appearance: alert, cooperative, no distress and elderly WF, ambulating w/ roling walker Neck: no carotid bruit and no JVD Lungs: clear to auscultation bilaterally Heart: regular rate and rhythm, S1, no S2, late peaking holosystolic 5 out of 6 murmur. Extremities: extremities normal, atraumatic, no cyanosis or edema Pulses: 2+ and symmetric Skin: Skin color,  texture, turgor normal.  No rashes or lesions Neurologic: Grossly normal  EKG  Performed, shows normal sinus rhythm, right bundle branch block with left anterior fascicular block, this is unchanged from prior- personally reviewed    ASSESSMENT AND PLAN:   1. Chronic Diastolic HF: EF normal. euvolemic on exam.  Will check creatinine and BNP today.  2. Aortic Stenosis: Severe on most recent echocardiogram in July 2020 with mean gradient of 38, today she has late peaking holosystolic murmur 5 out of 6 with no S2 consistent with severe aortic stenosis.  Her symptoms are minimal, we will follow her in 3 months and obtain echo prior to the next visit and refer for TAVR if she has worsening symptoms..  3. HTN: Repeated blood pressure normal.  4. PAF: In sinus rhythm today, she is not on anticoagulation secondary to prior history of anemia and epistaxis.  5. DM: management per PCP.   6. CKD: baseline SCr ~1.9-2.0.  We will obtain labs today.  Follow-Up: In 3 months  Ena Dawley, MD [facility] 11/17/2019 9:31 AM

## 2019-11-18 ENCOUNTER — Telehealth: Payer: Self-pay | Admitting: Cardiology

## 2019-11-18 DIAGNOSIS — R7989 Other specified abnormal findings of blood chemistry: Secondary | ICD-10-CM

## 2019-11-18 DIAGNOSIS — I5033 Acute on chronic diastolic (congestive) heart failure: Secondary | ICD-10-CM

## 2019-11-18 DIAGNOSIS — I5032 Chronic diastolic (congestive) heart failure: Secondary | ICD-10-CM

## 2019-11-18 LAB — COMPREHENSIVE METABOLIC PANEL
ALT: 6 IU/L (ref 0–32)
AST: 9 IU/L (ref 0–40)
Albumin/Globulin Ratio: 2.3 — ABNORMAL HIGH (ref 1.2–2.2)
Albumin: 4.1 g/dL (ref 3.5–4.6)
Alkaline Phosphatase: 92 IU/L (ref 39–117)
BUN/Creatinine Ratio: 21 (ref 12–28)
BUN: 49 mg/dL — ABNORMAL HIGH (ref 10–36)
Bilirubin Total: 0.4 mg/dL (ref 0.0–1.2)
CO2: 21 mmol/L (ref 20–29)
Calcium: 6.9 mg/dL — CL (ref 8.7–10.3)
Chloride: 106 mmol/L (ref 96–106)
Creatinine, Ser: 2.28 mg/dL — ABNORMAL HIGH (ref 0.57–1.00)
GFR calc Af Amer: 21 mL/min/{1.73_m2} — ABNORMAL LOW (ref >59)
GFR calc non Af Amer: 18 mL/min/{1.73_m2} — ABNORMAL LOW (ref >59)
Globulin, Total: 1.8 g/dL (ref 1.5–4.5)
Glucose: 51 mg/dL — ABNORMAL LOW (ref 65–99)
Potassium: 4.8 mmol/L (ref 3.5–5.2)
Sodium: 146 mmol/L — ABNORMAL HIGH (ref 134–144)
Total Protein: 5.9 g/dL — ABNORMAL LOW (ref 6.0–8.5)

## 2019-11-18 LAB — CBC
Hematocrit: 35.4 % (ref 34.0–46.6)
Hemoglobin: 11.3 g/dL (ref 11.1–15.9)
MCH: 30.9 pg (ref 26.6–33.0)
MCHC: 31.9 g/dL (ref 31.5–35.7)
MCV: 97 fL (ref 79–97)
Platelets: 223 10*3/uL (ref 150–450)
RBC: 3.66 x10E6/uL — ABNORMAL LOW (ref 3.77–5.28)
RDW: 14.5 % (ref 11.7–15.4)
WBC: 8.3 10*3/uL (ref 3.4–10.8)

## 2019-11-18 LAB — PRO B NATRIURETIC PEPTIDE: NT-Pro BNP: 1533 pg/mL — ABNORMAL HIGH (ref 0–738)

## 2019-11-18 NOTE — Telephone Encounter (Signed)
Spoke with the pt and informed her that her PRO-BNP came in, and Dr. Meda Coffee reviewed this.  Informed the pt that per Dr. Meda Coffee, she recommends that she continue holding her lasix, but she can restart this back on this Sunday 11/21/19, and she would like for her to come back in for repeat bmet and pro-bnp on Monday 2/1.  Pt states she cannot come in on Monday, for her son transports her back and forth to her appts, and he is scheduled to have a colonoscopy done that day.  Pt states she can come in on Tuesday 2/2, for repeat labs.  Informed the pt that will be fine, and I will let Dr. Meda Coffee know she will come on Tuesday for labs, instead of Monday.  Scheduled the pt to have repeat BMET/PRO-BNP done on next Tuesday 11/23/19.  Pt verbalized understanding and agrees with this plan.

## 2019-11-18 NOTE — Telephone Encounter (Signed)
Dr. Meda Coffee addressed the pts abnormal calcium result yesterday, and provided medication recommendations and follow-up about this abnormal lab. Please refer to result note from yesterday in regards to this.  Dr. Meda Coffee to still review and advise on abnormal PRO-BNP, that is now resulted.

## 2019-11-18 NOTE — Telephone Encounter (Signed)
Please hold her lasix for 2 days, then restart on Sunday, please bring her back on Monday for repeat BMP and BNP. Thank you, K

## 2019-11-18 NOTE — Telephone Encounter (Signed)
-----   Message from Dorothy Spark, MD sent at 11/18/2019  3:37 PM EST ----- Hold lasix for 2 days, restart on Sunday, repeat BNP, BMP on Monday ----- Message ----- From: Interface, [facility] Lab Results In Sent: 11/17/2019   4:38 PM EST To: Dorothy Spark, MD

## 2019-11-18 NOTE — Telephone Encounter (Signed)
Ann Sellers from [facility] with a critical lab.

## 2019-11-21 ENCOUNTER — Other Ambulatory Visit: Payer: Self-pay | Admitting: Cardiology

## 2019-11-23 ENCOUNTER — Other Ambulatory Visit: Payer: Medicare Other

## 2019-11-23 ENCOUNTER — Other Ambulatory Visit: Payer: Self-pay

## 2019-11-23 DIAGNOSIS — R7989 Other specified abnormal findings of blood chemistry: Secondary | ICD-10-CM | POA: Diagnosis not present

## 2019-11-23 DIAGNOSIS — I5032 Chronic diastolic (congestive) heart failure: Secondary | ICD-10-CM | POA: Diagnosis not present

## 2019-11-23 DIAGNOSIS — I5033 Acute on chronic diastolic (congestive) heart failure: Secondary | ICD-10-CM

## 2019-11-24 LAB — BASIC METABOLIC PANEL
BUN/Creatinine Ratio: 21 (ref 12–28)
BUN: 41 mg/dL — ABNORMAL HIGH (ref 10–36)
CO2: 23 mmol/L (ref 20–29)
Calcium: 9.3 mg/dL (ref 8.7–10.3)
Chloride: 108 mmol/L — ABNORMAL HIGH (ref 96–106)
Creatinine, Ser: 1.91 mg/dL — ABNORMAL HIGH (ref 0.57–1.00)
GFR calc Af Amer: 26 mL/min/{1.73_m2} — ABNORMAL LOW (ref >59)
GFR calc non Af Amer: 22 mL/min/{1.73_m2} — ABNORMAL LOW (ref >59)
Glucose: 88 mg/dL (ref 65–99)
Potassium: 5.5 mmol/L — ABNORMAL HIGH (ref 3.5–5.2)
Sodium: 148 mmol/L — ABNORMAL HIGH (ref 134–144)

## 2019-11-24 LAB — PRO B NATRIURETIC PEPTIDE: NT-Pro BNP: 3200 pg/mL — ABNORMAL HIGH (ref 0–738)

## 2019-11-26 ENCOUNTER — Telehealth: Payer: Self-pay | Admitting: Cardiology

## 2019-11-26 NOTE — Telephone Encounter (Signed)
Ann Sellers, called stating that [facility] told him they have not received any lab results from [place] and can not make an appt for [facility] until they receive the results.

## 2019-11-26 NOTE — Telephone Encounter (Signed)
Faxed pts labs again to [facility] attention to Dr. Posey Pronto.  Also called [facility] and spoke with Jeani Hawking, Dr. Serita Grit scheduler, and informed her that this pt needs to be seen asap by Dr. Posey Pronto, for close follow-up and management of her renal function. Per Dr. Oswaldo Milian, she advised that the pt call back and ask for her, and she will assist in getting her, her follow-up appt.  Called the pt and endorsed this, and she verbalized understanding and agrees with this plan.  Pt states she will call them back now and request to speak with Jeani Hawking, and make this appt.  Pt gracious for all the assistance provided.

## 2019-11-29 DIAGNOSIS — E1122 Type 2 diabetes mellitus with diabetic chronic kidney disease: Secondary | ICD-10-CM | POA: Diagnosis not present

## 2019-11-29 DIAGNOSIS — D631 Anemia in chronic kidney disease: Secondary | ICD-10-CM | POA: Diagnosis not present

## 2019-11-29 DIAGNOSIS — N184 Chronic kidney disease, stage 4 (severe): Secondary | ICD-10-CM | POA: Diagnosis not present

## 2019-11-29 DIAGNOSIS — I129 Hypertensive chronic kidney disease with stage 1 through stage 4 chronic kidney disease, or unspecified chronic kidney disease: Secondary | ICD-10-CM | POA: Diagnosis not present

## 2019-12-03 DIAGNOSIS — M109 Gout, unspecified: Secondary | ICD-10-CM | POA: Diagnosis not present

## 2019-12-03 DIAGNOSIS — R269 Unspecified abnormalities of gait and mobility: Secondary | ICD-10-CM | POA: Diagnosis not present

## 2019-12-03 DIAGNOSIS — E1129 Type 2 diabetes mellitus with other diabetic kidney complication: Secondary | ICD-10-CM | POA: Diagnosis not present

## 2019-12-03 DIAGNOSIS — E1142 Type 2 diabetes mellitus with diabetic polyneuropathy: Secondary | ICD-10-CM | POA: Diagnosis not present

## 2019-12-15 ENCOUNTER — Encounter: Payer: Self-pay | Admitting: Cardiology

## 2019-12-15 ENCOUNTER — Ambulatory Visit: Payer: Medicare Other | Admitting: Cardiology

## 2019-12-15 ENCOUNTER — Other Ambulatory Visit: Payer: Self-pay

## 2019-12-15 VITALS — BP 138/80 | HR 90 | Ht 60.0 in | Wt 178.8 lb

## 2019-12-15 DIAGNOSIS — I350 Nonrheumatic aortic (valve) stenosis: Secondary | ICD-10-CM

## 2019-12-15 NOTE — Patient Instructions (Signed)
Medication Instructions:  Your physician recommends that you continue on your current medications as directed. Please refer to the Current Medication list given to you today.  *If you need a refill on your cardiac medications before your next appointment, please call your pharmacy*  Lab Work: bmet, bnp   If you have labs (blood work) drawn today and your tests are completely normal, you will receive your results only by: Marland Kitchen MyChart Message (if you have MyChart) OR . A paper copy in the mail If you have any lab test that is abnormal or we need to change your treatment, we will call you to review the results.  Testing/Procedures: None ordered  Follow-Up: At [facility], you and your health needs are our priority.  As part of our continuing mission to provide you with exceptional heart care, we have created designated Provider Care Teams.  These Care Teams include your primary Cardiologist (physician) and Advanced Practice Providers (APPs -  Physician Assistants and Nurse Practitioners) who all work together to provide you with the care you need, when you need it.  Your next appointment:   3 month(s)  The format for your next appointment:   In Person  Provider:   Ena Dawley, MD  Other Instructions

## 2019-12-15 NOTE — Progress Notes (Signed)
12/15/2019 Ann Sellers   05/18/1925  [id number]  Primary Physician Reynold Bowen, MD Primary Cardiologist: Dr. Meda Coffee   Reason for Visit/CC: 3 months follow-up for severe aortic stenosis and acute on chronic diastolic CHF  HPI: Ann Sellers is a 86 y.o. female (appearing younger than stated age) with history of CKD III, DM, HTN, anemia, B12 deficiency, asthma, aortic stenosis and chronic diastolic CHF who presents to clinic today for f/u of CHF. Per rview of chart, she had elevated troponin of 0.25 while in the hospital in November 2017 during an episode of pneumonia and acute on chronic kidney insufficiency. This was felt to be due to demand ischemia. She also had paroxysms of atrial fib felt possibly due to her acute illness. She was not anticoagulated secondary to a history of recurrent epistaxis and anemia. She had a 2D Echo 09/14/16 that showed mild LVH, EF 65-70%, grade 1 DD, moderate aortic stenosis, mild MR/TR. She has required diuretics for her diastolic HF.  The patient underwent TTE in July 2020 that showed that her aortic stenosis is now severe mean gradient increased from 17 to 38 mmHg, her LVEF remains normal.  11/17/2019 -patient is coming after 3 months, she looks great, she continues to be fully independent, her symptoms are unchanged she walks with a walker but is able to do all activities of daily living with no significant chest pain or shortness of breath.  She denies any orthostatic hypotension.  She had an signout that patients can see there now episode of fall in November when she slipped on wet surface.  She underwent head CT that did not show any intracranial bleed.  12/15/2019, at the last visit we obtain labs with worsening creatinine 2.28, GFR of 18, and BNP elevated at 1500, as a result we held Lasix for 3 days, we also started PhosLo for severely decreased calcium.  Repeated blood work on February 2 showed improvement of creatinine to 1.9 but elevated BNP of  3200.  Patient is coming today, she states that she was here with her son, she walks with a walker and feels fully independent, her most significant limitation right now is macular degeneration that is associated with significant vision impairment, this is the reason why she is unable to cook or clean most of the time.  Cardiac Studies  2D Echo 12/2017 Study Conclusions  - Left ventricle: The cavity size was normal. Wall thickness was   increased in a pattern of mild LVH. Systolic function was   vigorous. The estimated ejection fraction was in the range of 65%   to 70%. Wall motion was normal; there were no regional wall   motion abnormalities. Doppler parameters are consistent with   abnormal left ventricular relaxation (grade 1 diastolic   dysfunction). - Aortic valve: There was mild to moderate stenosis. Valve area   (VTI): 1.19 cm^2. Valve area (Vmax): 1.38 cm^2. Valve area   (Vmean): 1.21 cm^2. - Mitral valve: Valve area by pressure half-time: 2.2 cm^2. Valve   area by continuity equation (using LVOT flow): 1.76 cm^2.   Current Meds  Medication Sig  . albuterol (PROVENTIL HFA;VENTOLIN HFA) 108 (90 BASE) MCG/ACT inhaler Inhale 2 puffs into the lungs every 6 (six) hours as needed for wheezing or shortness of breath.   Marland Kitchen albuterol (PROVENTIL) (2.5 MG/3ML) 0.083% nebulizer solution Take 2.5 mg by nebulization every 6 (six) hours as needed for wheezing or shortness of breath.  . allopurinol (ZYLOPRIM) 100 MG tablet Take 100 mg  by mouth daily.    . calcitRIOL (ROCALTROL) 0.25 MCG capsule Take 1 capsule by mouth daily.  . calcium acetate (PHOSLO) 667 MG capsule Take 1 capsule (667 mg total) by mouth 3 (three) times daily with meals.  . diclofenac sodium (VOLTAREN) 1 % GEL Apply 2 g topically daily as needed (pain).  Marland Kitchen diphenoxylate-atropine (LOMOTIL) 2.5-0.025 MG per tablet Take 1 tablet by mouth daily as needed for diarrhea or loose stools.  . fexofenadine (ALLEGRA) 180 MG tablet Take  1 tablet (180 mg total) by mouth daily.  . fluticasone (FLONASE) 50 MCG/ACT nasal spray Place 2 sprays into both nostrils daily.  . furosemide (LASIX) 40 MG tablet TAKE 1 TABLET BY MOUTH EVERY DAY  . gabapentin (NEURONTIN) 300 MG capsule Take 300 mg by mouth 3 (three) times daily.  . GNP ASPIRIN LOW DOSE 81 MG EC tablet Take 81 mg by mouth daily.  Marland Kitchen HYDROcodone-acetaminophen (NORCO/VICODIN) 5-325 MG per tablet Take 0.5-1 tablets by mouth every 6 (six) hours as needed for moderate pain.   Marland Kitchen insulin glargine (LANTUS) 100 UNIT/ML injection Inject 20 Units into the skin at bedtime.   . iron polysaccharides (NIFEREX) 150 MG capsule Take 150 mg by mouth daily.  Marland Kitchen JANUVIA 50 MG tablet Take 50 mg by mouth daily.  . metoprolol succinate (TOPROL-XL) 50 MG 24 hr tablet TAKE 1 TABLET BY MOUTH EVERY DAY WITH FOOD OR IMMEDIATELY AFTER EATING  . mometasone-formoterol (DULERA) 100-5 MCG/ACT AERO Take 2 puffs first thing in am and then another 2 puffs about 12 hours later.  . montelukast (SINGULAIR) 10 MG tablet Take 10 mg by mouth at bedtime.  . Multiple Vitamins-Minerals (ICAPS MV) TABS Take 1 tablet by mouth 2 (two) times daily. Reported on 11/20/2015  . omeprazole (PRILOSEC) 20 MG capsule Take 20 mg by mouth at bedtime.   Marland Kitchen omeprazole (PRILOSEC) 40 MG capsule Take 30-60 min before first meal of the day  . tiZANidine (ZANAFLEX) 4 MG tablet Take 4 mg by mouth every 8 (eight) hours as needed for muscle spasms.   . Vitamin D, Ergocalciferol, (DRISDOL) 50000 units CAPS capsule Take 50,000 Units by mouth once a week.   Allergies  Allergen Reactions  . Losartan Cough  . Snake Antivenin [Antivenin Crotalidae Polyvalent (Snake Antivenin)] Other (See Comments)    PARALYSIS   Past Medical History:  Diagnosis Date  . Anemia   . Arthritis   . Asthma   . B12 deficiency   . Chronic diastolic CHF (congestive heart failure) ([place])   . CKD (chronic kidney disease), stage III   . Diabetes mellitus ([place])   .  Hypertension   . Moderate aortic stenosis   . Neuromuscular disorder ([place])   . PAF (paroxysmal atrial fibrillation) ([place])   . Right rotator cuff tear   . Wears dentures    top denture-bottom partial  . Wears glasses   . Wears hearing aid    both ears   Family History  Problem Relation Age of Onset  . Pneumonia Mother   . Diabetes Son   . Hypertension Son   . Heart disease Sister   . Hypertension Sister   . Diabetes Sister   . Hypertension Sister   . Diabetes Daughter    Past Surgical History:  Procedure Laterality Date  . APPENDECTOMY  1945  . CATARACT EXTRACTION, BILATERAL  2008  . cervical stynosis  2005  . esophageal stretch  2001  . [place]  . herniated disc  1983  . left hand surgery  2011   gout  . left knee replacement  2002  . left knee surgery  1998  . right wrist fracture  2006  . SHOULDER ARTHROSCOPY WITH ROTATOR CUFF REPAIR Right 01/05/2013   Procedure: SHOULDER ARTHROSCOPY WITH ROTATOR CUFF REPAIR subchromial decompression and distal clavical excision;  Surgeon: Lorn Junes, MD;  Location: [facility];  Service: Orthopedics;  Laterality: Right;  . tonsillectomy  1936  . TONSILLECTOMY    . TYMPANOSTOMY TUBE PLACEMENT  1989  . VESICOVAGINAL FISTULA CLOSURE W/ TAH  1968   Social History   Socioeconomic History  . Marital status: Widowed    Spouse name: Not on file  . Number of children: 6  . Years of education: Not on file  . Highest education level: Not on file  Occupational History  . Occupation: homemaker  Tobacco Use  . Smoking status: Former Smoker    Packs/day: 0.50    Years: 20.00    Pack years: 10.00    Types: Cigarettes    Quit date: 10/22/1987    Years since quitting: 32.1  . Smokeless tobacco: Never Used  Substance and Sexual Activity  . Alcohol use: Yes    Alcohol/week: 7.0 standard drinks    Types: 7 Glasses of wine per week    Comment: social drinker  . Drug use: No  . Sexual activity: Not on  file  Other Topics Concern  . Not on file  Social History Narrative  . Not on file   Social Determinants of Health   Financial Resource Strain:   . Difficulty of Paying Living Expenses: Not on file  Food Insecurity:   . Worried About Charity fundraiser in the Last Year: Not on file  . Ran Out of Food in the Last Year: Not on file  Transportation Needs:   . Lack of Transportation (Medical): Not on file  . Lack of Transportation (Non-Medical): Not on file  Physical Activity:   . Days of Exercise per Week: Not on file  . Minutes of Exercise per Session: Not on file  Stress:   . Feeling of Stress : Not on file  Social Connections:   . Frequency of Communication with Friends and Family: Not on file  . Frequency of Social Gatherings with Friends and Family: Not on file  . Attends Religious Services: Not on file  . Active Member of Clubs or Organizations: Not on file  . Attends Archivist Meetings: Not on file  . Marital Status: Not on file  Intimate Partner Violence:   . Fear of Current or Ex-Partner: Not on file  . Emotionally Abused: Not on file  . Physically Abused: Not on file  . Sexually Abused: Not on file     Review of Systems: General: negative for chills, fever, night sweats or weight changes.  Cardiovascular: negative for chest pain, dyspnea on exertion, edema, orthopnea, palpitations, paroxysmal nocturnal dyspnea or shortness of breath Dermatological: negative for rash Respiratory: negative for cough or wheezing Urologic: negative for hematuria Abdominal: negative for nausea, vomiting, diarrhea, bright red blood per rectum, melena, or hematemesis Neurologic: negative for visual changes, syncope, or dizziness All other systems reviewed and are otherwise negative except as noted above.  Physical Exam:  Blood pressure 138/80, pulse 90, height 5' (1.524 m), weight 178 lb 12.8 oz (81.1 kg), SpO2 95 %.  General appearance: alert, cooperative, no distress and  elderly WF, ambulating w/ roling walker Neck:  no carotid bruit and no JVD Lungs: clear to auscultation bilaterally Heart: regular rate and rhythm, S1, no S2, late peaking holosystolic 5 out of 6 murmur. Extremities: extremities normal, atraumatic, no cyanosis or edema Pulses: 2+ and symmetric Skin: Skin color, texture, turgor normal. No rashes or lesions Neurologic: Grossly normal  EKG  Performed, shows normal sinus rhythm, right bundle branch block with left anterior fascicular block, this is unchanged from prior- personally reviewed   Echocardiogram July 2020 1. The left ventricle has hyperdynamic systolic function, with an  ejection fraction of >65%. The cavity size was normal. Left ventricular  diastolic Doppler parameters are consistent with impaired relaxation. No  evidence of left ventricular regional wall  motion abnormalities.  2. The right ventricle has normal systolic function. The cavity was  normal. There is no increase in right ventricular wall thickness.  3. Mild thickening of the mitral valve leaflet. Mild calcification of the  mitral valve leaflet. There is moderate mitral annular calcification  present.  4. The aortic valve is tricuspid. Mild thickening of the aortic valve.  Moderate calcification of the aortic valve. Moderate-severe stenosis of  the aortic valve.  5. Peak velocity: 3.82m/s, mean gradient 25mmHg.     ASSESSMENT AND PLAN:   Severe aortic Stenosis: Severe on most recent echocardiogram in July 2020 with mean gradient of 38, she has no S2 on physical exam, she denies any worsening symptoms, she is explained her options including TAVR, she wants to discuss with her family before proceeding.  She is fairly functional with her limitations are decreased vision and CKD stage IV.  Acute on chronic diastolic CHF with BNP of 0932, she is CKD stage IV consistent with cardiorenal syndrome: We will repeat her BNP and BMP today.  Hypertension -currently  controlled  PAF: In sinus rhythm today, she is not on anticoagulation secondary to prior history of anemia and epistaxis.  Follow-Up: In 3 months  Ena Dawley, MD [facility] 12/15/2019 10:46 AM

## 2019-12-16 LAB — BASIC METABOLIC PANEL
BUN/Creatinine Ratio: 22 (ref 12–28)
BUN: 44 mg/dL — ABNORMAL HIGH (ref 10–36)
CO2: 28 mmol/L (ref 20–29)
Calcium: 8.6 mg/dL — ABNORMAL LOW (ref 8.7–10.3)
Chloride: 105 mmol/L (ref 96–106)
Creatinine, Ser: 1.98 mg/dL — ABNORMAL HIGH (ref 0.57–1.00)
GFR calc Af Amer: 25 mL/min/{1.73_m2} — ABNORMAL LOW (ref >59)
GFR calc non Af Amer: 21 mL/min/{1.73_m2} — ABNORMAL LOW (ref >59)
Glucose: 65 mg/dL (ref 65–99)
Potassium: 5.1 mmol/L (ref 3.5–5.2)
Sodium: 145 mmol/L — ABNORMAL HIGH (ref 134–144)

## 2019-12-16 LAB — PRO B NATRIURETIC PEPTIDE: NT-Pro BNP: 2498 pg/mL — ABNORMAL HIGH (ref 0–738)

## 2019-12-17 ENCOUNTER — Telehealth: Payer: Self-pay | Admitting: *Deleted

## 2019-12-17 NOTE — Telephone Encounter (Signed)
Pt has been notified of lab results by phone with verbal understanding. Patient notified of result.  Please refer to phone note from today for complete details.   Julaine Hua, [facility] 12/17/2019 2:14 PM   Pt wants to let Dr. Meda Coffee know that she has discussed with all 6 children and they feel she should have the valve surgery. Pt states she is still trying to make the decision herself if she is going to proceed with the procedure. She said when she does decide about the procedure then she will call and s/w Charlcie Cradle. LPN and Dr. Meda Coffee. Pt thanked me for the call and the help.

## 2019-12-26 ENCOUNTER — Emergency Department (HOSPITAL_COMMUNITY): Payer: Medicare Other

## 2019-12-26 ENCOUNTER — Other Ambulatory Visit: Payer: Self-pay

## 2019-12-26 ENCOUNTER — Inpatient Hospital Stay (HOSPITAL_COMMUNITY)
Admission: EM | Admit: 2019-12-26 | Discharge: 2020-01-04 | DRG: 177 | Disposition: A | Discharge status: Skilled Nursing Facility | Payer: Medicare Other | Attending: Internal Medicine | Admitting: Internal Medicine

## 2019-12-26 ENCOUNTER — Inpatient Hospital Stay (HOSPITAL_COMMUNITY): Payer: Medicare Other

## 2019-12-26 ENCOUNTER — Encounter (HOSPITAL_COMMUNITY): Payer: Self-pay | Admitting: Emergency Medicine

## 2019-12-26 DIAGNOSIS — Z66 Do not resuscitate: Secondary | ICD-10-CM | POA: Diagnosis present

## 2019-12-26 DIAGNOSIS — M109 Gout, unspecified: Secondary | ICD-10-CM | POA: Diagnosis present

## 2019-12-26 DIAGNOSIS — K8020 Calculus of gallbladder without cholecystitis without obstruction: Secondary | ICD-10-CM | POA: Diagnosis not present

## 2019-12-26 DIAGNOSIS — R918 Other nonspecific abnormal finding of lung field: Secondary | ICD-10-CM | POA: Diagnosis not present

## 2019-12-26 DIAGNOSIS — Z79899 Other long term (current) drug therapy: Secondary | ICD-10-CM

## 2019-12-26 DIAGNOSIS — E875 Hyperkalemia: Secondary | ICD-10-CM | POA: Diagnosis present

## 2019-12-26 DIAGNOSIS — J690 Pneumonitis due to inhalation of food and vomit: Secondary | ICD-10-CM | POA: Diagnosis not present

## 2019-12-26 DIAGNOSIS — N179 Acute kidney failure, unspecified: Secondary | ICD-10-CM | POA: Diagnosis not present

## 2019-12-26 DIAGNOSIS — G9340 Encephalopathy, unspecified: Secondary | ICD-10-CM | POA: Diagnosis present

## 2019-12-26 DIAGNOSIS — Z87891 Personal history of nicotine dependence: Secondary | ICD-10-CM | POA: Diagnosis not present

## 2019-12-26 DIAGNOSIS — Z96652 Presence of left artificial knee joint: Secondary | ICD-10-CM | POA: Diagnosis not present

## 2019-12-26 DIAGNOSIS — I10 Essential (primary) hypertension: Secondary | ICD-10-CM | POA: Diagnosis not present

## 2019-12-26 DIAGNOSIS — Z90710 Acquired absence of both cervix and uterus: Secondary | ICD-10-CM

## 2019-12-26 DIAGNOSIS — J9690 Respiratory failure, unspecified, unspecified whether with hypoxia or hypercapnia: Secondary | ICD-10-CM | POA: Diagnosis not present

## 2019-12-26 DIAGNOSIS — Z20822 Contact with and (suspected) exposure to covid-19: Secondary | ICD-10-CM | POA: Diagnosis present

## 2019-12-26 DIAGNOSIS — R2689 Other abnormalities of gait and mobility: Secondary | ICD-10-CM | POA: Diagnosis not present

## 2019-12-26 DIAGNOSIS — E870 Hyperosmolality and hypernatremia: Secondary | ICD-10-CM | POA: Diagnosis not present

## 2019-12-26 DIAGNOSIS — I480 Paroxysmal atrial fibrillation: Secondary | ICD-10-CM | POA: Diagnosis not present

## 2019-12-26 DIAGNOSIS — J9602 Acute respiratory failure with hypercapnia: Secondary | ICD-10-CM | POA: Diagnosis not present

## 2019-12-26 DIAGNOSIS — K5730 Diverticulosis of large intestine without perforation or abscess without bleeding: Secondary | ICD-10-CM | POA: Diagnosis not present

## 2019-12-26 DIAGNOSIS — J9600 Acute respiratory failure, unspecified whether with hypoxia or hypercapnia: Secondary | ICD-10-CM | POA: Diagnosis not present

## 2019-12-26 DIAGNOSIS — M6281 Muscle weakness (generalized): Secondary | ICD-10-CM | POA: Diagnosis not present

## 2019-12-26 DIAGNOSIS — J9601 Acute respiratory failure with hypoxia: Secondary | ICD-10-CM | POA: Diagnosis present

## 2019-12-26 DIAGNOSIS — E872 Acidosis: Secondary | ICD-10-CM | POA: Diagnosis present

## 2019-12-26 DIAGNOSIS — E11649 Type 2 diabetes mellitus with hypoglycemia without coma: Secondary | ICD-10-CM | POA: Diagnosis present

## 2019-12-26 DIAGNOSIS — Z7951 Long term (current) use of inhaled steroids: Secondary | ICD-10-CM

## 2019-12-26 DIAGNOSIS — R531 Weakness: Secondary | ICD-10-CM | POA: Diagnosis not present

## 2019-12-26 DIAGNOSIS — R2681 Unsteadiness on feet: Secondary | ICD-10-CM | POA: Diagnosis not present

## 2019-12-26 DIAGNOSIS — J189 Pneumonia, unspecified organism: Secondary | ICD-10-CM | POA: Diagnosis not present

## 2019-12-26 DIAGNOSIS — E1122 Type 2 diabetes mellitus with diabetic chronic kidney disease: Secondary | ICD-10-CM | POA: Diagnosis present

## 2019-12-26 DIAGNOSIS — R0602 Shortness of breath: Secondary | ICD-10-CM | POA: Diagnosis not present

## 2019-12-26 DIAGNOSIS — I4510 Unspecified right bundle-branch block: Secondary | ICD-10-CM | POA: Diagnosis not present

## 2019-12-26 DIAGNOSIS — E639 Nutritional deficiency, unspecified: Secondary | ICD-10-CM | POA: Diagnosis present

## 2019-12-26 DIAGNOSIS — N183 Chronic kidney disease, stage 3 unspecified: Secondary | ICD-10-CM | POA: Diagnosis not present

## 2019-12-26 DIAGNOSIS — R011 Cardiac murmur, unspecified: Secondary | ICD-10-CM | POA: Diagnosis not present

## 2019-12-26 DIAGNOSIS — R9431 Abnormal electrocardiogram [ECG] [EKG]: Secondary | ICD-10-CM | POA: Diagnosis not present

## 2019-12-26 DIAGNOSIS — R1310 Dysphagia, unspecified: Secondary | ICD-10-CM | POA: Diagnosis not present

## 2019-12-26 DIAGNOSIS — Z794 Long term (current) use of insulin: Secondary | ICD-10-CM | POA: Diagnosis not present

## 2019-12-26 DIAGNOSIS — I4891 Unspecified atrial fibrillation: Secondary | ICD-10-CM | POA: Diagnosis not present

## 2019-12-26 DIAGNOSIS — E279 Disorder of adrenal gland, unspecified: Secondary | ICD-10-CM | POA: Diagnosis not present

## 2019-12-26 DIAGNOSIS — A419 Sepsis, unspecified organism: Secondary | ICD-10-CM

## 2019-12-26 DIAGNOSIS — D631 Anemia in chronic kidney disease: Secondary | ICD-10-CM | POA: Diagnosis present

## 2019-12-26 DIAGNOSIS — R410 Disorientation, unspecified: Secondary | ICD-10-CM | POA: Diagnosis not present

## 2019-12-26 DIAGNOSIS — R0689 Other abnormalities of breathing: Secondary | ICD-10-CM | POA: Diagnosis not present

## 2019-12-26 DIAGNOSIS — R000 Tachycardia, unspecified: Secondary | ICD-10-CM | POA: Diagnosis not present

## 2019-12-26 DIAGNOSIS — L8990 Pressure ulcer of unspecified site, unspecified stage: Secondary | ICD-10-CM | POA: Diagnosis present

## 2019-12-26 DIAGNOSIS — M2550 Pain in unspecified joint: Secondary | ICD-10-CM | POA: Diagnosis not present

## 2019-12-26 DIAGNOSIS — H9190 Unspecified hearing loss, unspecified ear: Secondary | ICD-10-CM | POA: Diagnosis not present

## 2019-12-26 DIAGNOSIS — N1832 Chronic kidney disease, stage 3b: Secondary | ICD-10-CM | POA: Diagnosis not present

## 2019-12-26 DIAGNOSIS — I5032 Chronic diastolic (congestive) heart failure: Secondary | ICD-10-CM | POA: Diagnosis not present

## 2019-12-26 DIAGNOSIS — I350 Nonrheumatic aortic (valve) stenosis: Secondary | ICD-10-CM | POA: Diagnosis not present

## 2019-12-26 DIAGNOSIS — E1169 Type 2 diabetes mellitus with other specified complication: Secondary | ICD-10-CM | POA: Diagnosis not present

## 2019-12-26 DIAGNOSIS — J90 Pleural effusion, not elsewhere classified: Secondary | ICD-10-CM | POA: Diagnosis not present

## 2019-12-26 DIAGNOSIS — E1142 Type 2 diabetes mellitus with diabetic polyneuropathy: Secondary | ICD-10-CM | POA: Diagnosis present

## 2019-12-26 DIAGNOSIS — N184 Chronic kidney disease, stage 4 (severe): Secondary | ICD-10-CM | POA: Diagnosis not present

## 2019-12-26 DIAGNOSIS — D539 Nutritional anemia, unspecified: Secondary | ICD-10-CM | POA: Diagnosis not present

## 2019-12-26 DIAGNOSIS — N189 Chronic kidney disease, unspecified: Secondary | ICD-10-CM | POA: Diagnosis not present

## 2019-12-26 DIAGNOSIS — J45909 Unspecified asthma, uncomplicated: Secondary | ICD-10-CM | POA: Diagnosis present

## 2019-12-26 DIAGNOSIS — I130 Hypertensive heart and chronic kidney disease with heart failure and stage 1 through stage 4 chronic kidney disease, or unspecified chronic kidney disease: Secondary | ICD-10-CM | POA: Diagnosis not present

## 2019-12-26 DIAGNOSIS — E1140 Type 2 diabetes mellitus with diabetic neuropathy, unspecified: Secondary | ICD-10-CM | POA: Diagnosis not present

## 2019-12-26 DIAGNOSIS — H3530 Unspecified macular degeneration: Secondary | ICD-10-CM | POA: Diagnosis present

## 2019-12-26 DIAGNOSIS — R0902 Hypoxemia: Secondary | ICD-10-CM

## 2019-12-26 DIAGNOSIS — R4182 Altered mental status, unspecified: Secondary | ICD-10-CM | POA: Diagnosis not present

## 2019-12-26 DIAGNOSIS — R6520 Severe sepsis without septic shock: Secondary | ICD-10-CM | POA: Diagnosis not present

## 2019-12-26 DIAGNOSIS — I471 Supraventricular tachycardia: Secondary | ICD-10-CM | POA: Diagnosis not present

## 2019-12-26 DIAGNOSIS — Z7401 Bed confinement status: Secondary | ICD-10-CM | POA: Diagnosis not present

## 2019-12-26 DIAGNOSIS — H539 Unspecified visual disturbance: Secondary | ICD-10-CM | POA: Diagnosis not present

## 2019-12-26 LAB — BASIC METABOLIC PANEL
Anion gap: 8 (ref 5–15)
Anion gap: 10 (ref 5–15)
BUN: 53 mg/dL — ABNORMAL HIGH (ref 8–23)
BUN: 53 mg/dL — ABNORMAL HIGH (ref 8–23)
CO2: 25 mmol/L (ref 22–32)
CO2: 24 mmol/L (ref 22–32)
Calcium: 7.3 mg/dL — ABNORMAL LOW (ref 8.9–10.3)
Calcium: 7.9 mg/dL — ABNORMAL LOW (ref 8.9–10.3)
Chloride: 110 mmol/L (ref 98–111)
Chloride: 110 mmol/L (ref 98–111)
Creatinine, Ser: 2.48 mg/dL — ABNORMAL HIGH (ref 0.44–1.00)
Creatinine, Ser: 2.43 mg/dL — ABNORMAL HIGH (ref 0.44–1.00)
GFR calc Af Amer: 19 mL/min — ABNORMAL LOW (ref >60)
GFR calc Af Amer: 19 mL/min — ABNORMAL LOW (ref >60)
GFR calc non Af Amer: 16 mL/min — ABNORMAL LOW (ref >60)
GFR calc non Af Amer: 17 mL/min — ABNORMAL LOW (ref >60)
Glucose, Bld: 102 mg/dL — ABNORMAL HIGH (ref 70–99)
Glucose, Bld: 50 mg/dL — ABNORMAL LOW (ref 70–99)
Potassium: 5.6 mmol/L — ABNORMAL HIGH (ref 3.5–5.1)
Potassium: 4.8 mmol/L (ref 3.5–5.1)
Sodium: 143 mmol/L (ref 135–145)
Sodium: 144 mmol/L (ref 135–145)

## 2019-12-26 LAB — CBC WITH DIFFERENTIAL/PLATELET
Abs Immature Granulocytes: 0.02 10*3/uL (ref 0.00–0.07)
Basophils Absolute: 0 10*3/uL (ref 0.0–0.1)
Basophils Relative: 0 %
Eosinophils Absolute: 0 10*3/uL (ref 0.0–0.5)
Eosinophils Relative: 0 %
HCT: 37.1 % (ref 36.0–46.0)
Hemoglobin: 11.2 g/dL — ABNORMAL LOW (ref 12.0–15.0)
Immature Granulocytes: 0 %
Lymphocytes Relative: 5 %
Lymphs Abs: 0.6 10*3/uL — ABNORMAL LOW (ref 0.7–4.0)
MCH: 30.7 pg (ref 26.0–34.0)
MCHC: 30.2 g/dL (ref 30.0–36.0)
MCV: 101.6 fL — ABNORMAL HIGH (ref 80.0–100.0)
Monocytes Absolute: 0.5 10*3/uL (ref 0.1–1.0)
Monocytes Relative: 5 %
Neutro Abs: 9.4 10*3/uL — ABNORMAL HIGH (ref 1.7–7.7)
Neutrophils Relative %: 90 %
Platelets: 196 10*3/uL (ref 150–400)
RBC: 3.65 MIL/uL — ABNORMAL LOW (ref 3.87–5.11)
RDW: 14.8 % (ref 11.5–15.5)
WBC: 10.5 10*3/uL (ref 4.0–10.5)
nRBC: 0 % (ref 0.0–0.2)

## 2019-12-26 LAB — COMPREHENSIVE METABOLIC PANEL
ALT: 13 U/L (ref 0–44)
AST: 25 U/L (ref 15–41)
Albumin: 3.2 g/dL — ABNORMAL LOW (ref 3.5–5.0)
Alkaline Phosphatase: 86 U/L (ref 38–126)
Anion gap: 10 (ref 5–15)
BUN: 53 mg/dL — ABNORMAL HIGH (ref 8–23)
CO2: 26 mmol/L (ref 22–32)
Calcium: 8.2 mg/dL — ABNORMAL LOW (ref 8.9–10.3)
Chloride: 110 mmol/L (ref 98–111)
Creatinine, Ser: 2.55 mg/dL — ABNORMAL HIGH (ref 0.44–1.00)
GFR calc Af Amer: 18 mL/min — ABNORMAL LOW (ref >60)
GFR calc non Af Amer: 16 mL/min — ABNORMAL LOW (ref >60)
Glucose, Bld: 218 mg/dL — ABNORMAL HIGH (ref 70–99)
Potassium: 5.5 mmol/L — ABNORMAL HIGH (ref 3.5–5.1)
Sodium: 146 mmol/L — ABNORMAL HIGH (ref 135–145)
Total Bilirubin: 0.7 mg/dL (ref 0.3–1.2)
Total Protein: 5.7 g/dL — ABNORMAL LOW (ref 6.5–8.1)

## 2019-12-26 LAB — LACTIC ACID, PLASMA: Lactic Acid, Venous: 1.5 mmol/L (ref 0.5–1.9)

## 2019-12-26 LAB — POC SARS CORONAVIRUS 2 AG -  ED: SARS Coronavirus 2 Ag: NEGATIVE

## 2019-12-26 LAB — GLUCOSE, CAPILLARY
Glucose-Capillary: 82 mg/dL (ref 70–99)
Glucose-Capillary: 129 mg/dL — ABNORMAL HIGH (ref 70–99)
Glucose-Capillary: 80 mg/dL (ref 70–99)

## 2019-12-26 LAB — PROTIME-INR
INR: 0.9 (ref 0.8–1.2)
Prothrombin Time: 11.9 seconds (ref 11.4–15.2)

## 2019-12-26 LAB — TROPONIN I (HIGH SENSITIVITY)
Troponin I (High Sensitivity): 56 ng/L — ABNORMAL HIGH (ref <18)
Troponin I (High Sensitivity): 58 ng/L — ABNORMAL HIGH (ref <18)

## 2019-12-26 LAB — HEMOGLOBIN A1C
Hgb A1c MFr Bld: 6.1 % — ABNORMAL HIGH (ref 4.8–5.6)
Mean Plasma Glucose: 128.37 mg/dL

## 2019-12-26 LAB — SARS CORONAVIRUS 2 (TAT 6-24 HRS): SARS Coronavirus 2: NEGATIVE

## 2019-12-26 LAB — APTT: aPTT: 28 seconds (ref 24–36)

## 2019-12-26 LAB — MRSA PCR SCREENING: MRSA by PCR: NEGATIVE

## 2019-12-26 MED ORDER — SODIUM CHLORIDE 0.9 % IV SOLN
1.0000 g | INTRAVENOUS | Status: DC
  Administered 2019-12-26: 1 g via INTRAVENOUS
  Filled 2019-12-26 (×2): qty 10

## 2019-12-26 MED ORDER — ALLOPURINOL 100 MG PO TABS
50.0000 mg | ORAL_TABLET | ORAL | Status: DC
  Administered 2019-12-27 – 2020-01-04 (×6): 50 mg via ORAL
  Filled 2019-12-26 (×8): qty 1

## 2019-12-26 MED ORDER — ACETAMINOPHEN 650 MG RE SUPP: 650.0000 mg | Freq: Four times a day (QID) | RECTAL | Status: DC | PRN

## 2019-12-26 MED ORDER — INSULIN GLARGINE 100 UNIT/ML ~~LOC~~ SOLN
15.0000 [IU] | Freq: Every day | SUBCUTANEOUS | Status: DC
  Filled 2019-12-26: qty 0.15

## 2019-12-26 MED ORDER — INSULIN ASPART 100 UNIT/ML IV SOLN: 10.0000 [IU] | Freq: Once | INTRAVENOUS | Status: DC

## 2019-12-26 MED ORDER — INSULIN ASPART 100 UNIT/ML ~~LOC~~ SOLN: 0.0000 [IU] | Freq: Three times a day (TID) | SUBCUTANEOUS | Status: DC

## 2019-12-26 MED ORDER — PANTOPRAZOLE SODIUM 40 MG PO TBEC
40.0000 mg | DELAYED_RELEASE_TABLET | Freq: Every day | ORAL | Status: DC
  Administered 2019-12-26 – 2019-12-27 (×2): 40 mg via ORAL
  Filled 2019-12-26 (×2): qty 1

## 2019-12-26 MED ORDER — ENOXAPARIN SODIUM 30 MG/0.3ML ~~LOC~~ SOLN
30.0000 mg | SUBCUTANEOUS | Status: DC
  Administered 2019-12-26 – 2020-01-03 (×9): 30 mg via SUBCUTANEOUS
  Filled 2019-12-26 (×9): qty 0.3

## 2019-12-26 MED ORDER — AZITHROMYCIN 250 MG PO TABS
250.0000 mg | ORAL_TABLET | Freq: Every day | ORAL | Status: DC
  Administered 2019-12-27: 250 mg via ORAL
  Filled 2019-12-26: qty 1

## 2019-12-26 MED ORDER — CALCIUM GLUCONATE-NACL 1-0.675 GM/50ML-% IV SOLN
1.0000 g | Freq: Once | INTRAVENOUS | Status: AC
  Administered 2019-12-26: 1000 mg via INTRAVENOUS
  Filled 2019-12-26: qty 50

## 2019-12-26 MED ORDER — ACETAMINOPHEN 325 MG PO TABS
650.0000 mg | ORAL_TABLET | Freq: Four times a day (QID) | ORAL | Status: DC | PRN
  Administered 2019-12-27 – 2020-01-02 (×5): 650 mg via ORAL
  Filled 2019-12-26 (×7): qty 2

## 2019-12-26 MED ORDER — GABAPENTIN 600 MG PO TABS
300.0000 mg | ORAL_TABLET | Freq: Two times a day (BID) | ORAL | Status: DC
  Administered 2019-12-26 – 2019-12-28 (×4): 300 mg via ORAL
  Filled 2019-12-26 (×5): qty 1

## 2019-12-26 MED ORDER — ASPIRIN EC 81 MG PO TBEC
81.0000 mg | DELAYED_RELEASE_TABLET | Freq: Every day | ORAL | Status: DC
  Administered 2019-12-26 – 2019-12-27 (×2): 81 mg via ORAL
  Filled 2019-12-26 (×2): qty 1

## 2019-12-26 MED ORDER — INSULIN ASPART 100 UNIT/ML ~~LOC~~ SOLN: 0.0000 [IU] | Freq: Every day | SUBCUTANEOUS | Status: DC

## 2019-12-26 MED ORDER — VANCOMYCIN HCL 1500 MG/300ML IV SOLN
1500.0000 mg | Freq: Once | INTRAVENOUS | Status: AC
  Administered 2019-12-26: 1500 mg via INTRAVENOUS
  Filled 2019-12-26: qty 300

## 2019-12-26 MED ORDER — MOMETASONE FURO-FORMOTEROL FUM 100-5 MCG/ACT IN AERO
2.0000 | INHALATION_SPRAY | Freq: Two times a day (BID) | RESPIRATORY_TRACT | Status: DC
  Administered 2019-12-27 – 2020-01-04 (×14): 2 via RESPIRATORY_TRACT
  Filled 2019-12-26 (×2): qty 8.8

## 2019-12-26 MED ORDER — LORATADINE 10 MG PO TABS
10.0000 mg | ORAL_TABLET | Freq: Every day | ORAL | Status: DC
  Administered 2019-12-26 – 2020-01-04 (×10): 10 mg via ORAL
  Filled 2019-12-26 (×12): qty 1

## 2019-12-26 MED ORDER — SODIUM CHLORIDE 0.9 % IV BOLUS
1000.0000 mL | Freq: Once | INTRAVENOUS | Status: AC
  Administered 2019-12-26: 1000 mL via INTRAVENOUS

## 2019-12-26 MED ORDER — VANCOMYCIN HCL 500 MG/100ML IV SOLN: 500.0000 mg | INTRAVENOUS | Status: DC

## 2019-12-26 MED ORDER — SODIUM ZIRCONIUM CYCLOSILICATE 10 G PO PACK
10.0000 g | PACK | Freq: Once | ORAL | Status: AC
  Administered 2019-12-26: 10 g via ORAL
  Filled 2019-12-26: qty 1

## 2019-12-26 MED ORDER — SODIUM ZIRCONIUM CYCLOSILICATE 5 G PO PACK
5.0000 g | PACK | Freq: Once | ORAL | Status: AC
  Administered 2019-12-26: 5 g via ORAL
  Filled 2019-12-26: qty 1

## 2019-12-26 MED ORDER — ACETAMINOPHEN 325 MG PO TABS
650.0000 mg | ORAL_TABLET | Freq: Once | ORAL | Status: AC
  Administered 2019-12-26: 650 mg via ORAL
  Filled 2019-12-26: qty 2

## 2019-12-26 MED ORDER — DEXTROSE 50 % IV SOLN
1.0000 | Freq: Once | INTRAVENOUS | Status: AC
  Administered 2019-12-26: 50 mL via INTRAVENOUS
  Filled 2019-12-26: qty 50

## 2019-12-26 MED ORDER — CALCITRIOL 0.25 MCG PO CAPS
0.2500 ug | ORAL_CAPSULE | Freq: Every day | ORAL | Status: DC
  Administered 2019-12-26 – 2019-12-27 (×2): 0.25 ug via ORAL
  Filled 2019-12-26 (×3): qty 1

## 2019-12-26 MED ORDER — ALBUTEROL SULFATE (2.5 MG/3ML) 0.083% IN NEBU
3.0000 mL | INHALATION_SOLUTION | Freq: Four times a day (QID) | RESPIRATORY_TRACT | Status: DC | PRN
  Administered 2019-12-27 – 2019-12-29 (×3): 3 mL via RESPIRATORY_TRACT
  Filled 2019-12-26 (×3): qty 3

## 2019-12-26 MED ORDER — PIPERACILLIN-TAZOBACTAM 3.375 G IVPB 30 MIN
3.3750 g | Freq: Once | INTRAVENOUS | Status: AC
  Administered 2019-12-26: 3.375 g via INTRAVENOUS
  Filled 2019-12-26: qty 50

## 2019-12-26 MED ORDER — DEXTROSE 50 % IV SOLN
1.0000 | Freq: Once | INTRAVENOUS | Status: AC | PRN
  Administered 2019-12-26: 50 mL via INTRAVENOUS
  Filled 2019-12-26: qty 50

## 2019-12-26 MED ORDER — INSULIN ASPART 100 UNIT/ML IV SOLN
10.0000 [IU] | Freq: Once | INTRAVENOUS | Status: AC
  Administered 2019-12-26: 10 [IU] via INTRAVENOUS

## 2019-12-26 MED ORDER — CALCIUM ACETATE (PHOS BINDER) 667 MG PO CAPS
667.0000 mg | ORAL_CAPSULE | Freq: Three times a day (TID) | ORAL | Status: DC
  Administered 2019-12-26 – 2019-12-28 (×4): 667 mg via ORAL
  Filled 2019-12-26 (×5): qty 1

## 2019-12-26 MED ORDER — DICLOFENAC SODIUM 1 % TD GEL
2.0000 g | Freq: Every day | TRANSDERMAL | Status: DC | PRN
  Filled 2019-12-26: qty 100

## 2019-12-26 MED ORDER — METOPROLOL SUCCINATE ER 50 MG PO TB24
50.0000 mg | ORAL_TABLET | Freq: Every day | ORAL | Status: DC
  Administered 2019-12-26 – 2019-12-27 (×2): 50 mg via ORAL
  Filled 2019-12-26 (×3): qty 1

## 2019-12-26 MED ORDER — AZITHROMYCIN 250 MG PO TABS
500.0000 mg | ORAL_TABLET | Freq: Every day | ORAL | Status: AC
  Administered 2019-12-26: 500 mg via ORAL
  Filled 2019-12-26: qty 2

## 2019-12-26 NOTE — Plan of Care (Signed)

## 2019-12-26 NOTE — Progress Notes (Signed)
Notified provider and bedside nurse of need to order fluid bolus.  Message sent asking for additional fluids to be ordered due to low maps. Volume recommendation based off documented weight is 241ml.

## 2019-12-26 NOTE — ED Triage Notes (Signed)
Pt here with EMS with SOB, cough, vomiting, and diarrhea that started last night. Ems states temp was 100. EMS states poss aspiration of emesis. They administered 4mg  zofran and epi 0.3.

## 2019-12-26 NOTE — H&P (Signed)
NAME:  Ann Sellers, MRN:  [id number], DOB:  1925/02/09, LOS: 0 ADMISSION DATE:  12/26/2019, Primary: Reynold Bowen, MD  CHIEF COMPLAINT:  SOB  Medical Service: Internal Medicine Teaching Service         Attending Physician: Dr. Lucious Groves, DO    First Contact: Dr. Darrick Meigs Pager: [phone]  Second Contact: Dr. Sharon Seller Pager: [phone]       After Hours (After 5p/  First Contact Pager: [phone]  weekends / holidays): Second Contact Pager: [phone]    History of present illness    86 yo female with PMH of asthma and heart failure who is presenting with 2d history of progressive shortness of breath. Symptoms preceded by going out to a [place] and throwing up. She has had no further episodes of emesis since that time however she does endorse continued shortness of breath that began shortly thereafter. She denies chest pain, palpitations, or abdominal pain. Denies orthopnea. does endorse increased dry cough since onset of symptoms. She denies fever or chills at home. No recent sick contacts.  Past Medical History  She,  has a past medical history of Anemia, Arthritis, Asthma, B12 deficiency, Chronic diastolic CHF (congestive heart failure) ([place]), CKD (chronic kidney disease), stage III, Diabetes mellitus ([place]), Hypertension, Moderate aortic stenosis, Neuromuscular disorder ([place]), PAF (paroxysmal atrial fibrillation) ([place]), Right rotator cuff tear, Wears dentures, Wears glasses, and Wears hearing aid.   Home Medications     Prior to Admission medications   Medication Sig Start Date End Date Taking? Authorizing Provider  albuterol (PROVENTIL HFA;VENTOLIN HFA) 108 (90 BASE) MCG/ACT inhaler Inhale 2 puffs into the lungs every 6 (six) hours as needed for wheezing or shortness of breath.    Yes [provider]  albuterol (PROVENTIL) (2.5 MG/3ML) 0.083% nebulizer solution Take 2.5 mg by nebulization every 6 (six) hours as needed for wheezing or shortness of breath.   Yes  [provider]  allopurinol (ZYLOPRIM) 100 MG tablet Take 100 mg by mouth daily.     Yes [provider]  calcitRIOL (ROCALTROL) 0.25 MCG capsule Take 1 capsule by mouth daily. 06/28/15  Yes [provider]  calcium acetate (PHOSLO) 667 MG capsule Take 1 capsule (667 mg total) by mouth 3 (three) times daily with meals. 11/17/19  Yes Dorothy Spark, MD  diclofenac sodium (VOLTAREN) 1 % GEL Apply 2 g topically daily as needed (pain).   Yes [provider]  diphenoxylate-atropine (LOMOTIL) 2.5-0.025 MG per tablet Take 1 tablet by mouth daily as needed for diarrhea or loose stools.   Yes [provider]  fexofenadine (ALLEGRA) 180 MG tablet Take 1 tablet (180 mg total) by mouth daily. 04/25/16  Yes Tanda Rockers, MD  fluticasone (FLONASE) 50 MCG/ACT nasal spray Place 2 sprays into both nostrils daily. 07/08/17  Yes [provider]  furosemide (LASIX) 40 MG tablet TAKE 1 TABLET BY MOUTH EVERY DAY Patient taking differently: Take 40 mg by mouth daily. TAKE 1 TABLET BY MOUTH EVERY DAY 04/08/19  Yes Dorothy Spark, MD  gabapentin (NEURONTIN) 300 MG capsule Take 300 mg by mouth 3 (three) times daily. 04/01/17  Yes [provider]  GNP ASPIRIN LOW DOSE 81 MG EC tablet Take 81 mg by mouth daily. 07/15/17  Yes [provider]  HYDROcodone-acetaminophen (NORCO/VICODIN) 5-325 MG per tablet Take 0.5-1 tablets by mouth every 6 (six) hours as needed for moderate pain.  06/17/15  Yes [provider]  insulin glargine (LANTUS) 100 UNIT/ML injection  Inject 20 Units into the skin at bedtime.    Yes [provider]  iron polysaccharides (NIFEREX) 150 MG capsule Take 150 mg by mouth daily.   Yes [provider]  JANUVIA 50 MG tablet Take 50 mg by mouth daily. 07/11/17  Yes [provider]  metoprolol succinate (TOPROL-XL) 50 MG 24 hr tablet TAKE 1 TABLET BY MOUTH EVERY DAY WITH FOOD OR IMMEDIATELY AFTER  EATING Patient taking differently: Take 50 mg by mouth daily.  11/22/19  Yes Dorothy Spark, MD  mometasone-formoterol [facility]) 100-5 MCG/ACT AERO Take 2 puffs first thing in am and then another 2 puffs about 12 hours later. 08/22/15  Yes Tanda Rockers, MD  montelukast (SINGULAIR) 10 MG tablet Take 10 mg by mouth at bedtime.   Yes [provider]  omeprazole (PRILOSEC) 20 MG capsule Take 20 mg by mouth at bedtime.  11/11/17  Yes [provider]  omeprazole (PRILOSEC) 40 MG capsule Take 30-60 min before first meal of the day 11/23/15  Yes Tanda Rockers, MD  tiZANidine (ZANAFLEX) 4 MG tablet Take 4 mg by mouth every 8 (eight) hours as needed for muscle spasms.    Yes [provider]  Vitamin D, Ergocalciferol, (DRISDOL) 50000 units CAPS capsule Take 50,000 Units by mouth once a week. 09/20/16  Yes [provider]    Allergies    Allergies as of 12/26/2019 - Review Complete 12/26/2019  Allergen Reaction Noted  . Losartan Cough 09/16/2016  . Snake antivenin [antivenin crotalidae polyvalent (snake antivenin)] Other (See Comments) 07/11/2011    Social History   reports that she quit smoking about 32 years ago. Her smoking use included cigarettes. She has a 10.00 pack-year smoking history. She has never used smokeless tobacco. She reports current alcohol use of about 7.0 standard drinks of alcohol per week. She reports that she does not use drugs.   Family History   Her family history includes Diabetes in her daughter, sister, and son; Heart disease in her sister; Hypertension in her sister, sister, and son; Pneumonia in her mother.   ROS  Review of Systems  Constitutional: Negative for chills and fever.  HENT: Negative.   Respiratory: Positive for cough and shortness of breath. Negative for sputum production.   Cardiovascular: Negative for chest pain and palpitations.  Gastrointestinal: Positive for diarrhea, nausea and vomiting. Negative for abdominal  pain.  Genitourinary: Negative.   Musculoskeletal: Negative.   Skin: Negative.   Neurological: Negative.   Psychiatric/Behavioral: Negative.     Objective   Blood pressure (!) 104/55, pulse 73, temperature 98.5 F (36.9 C), temperature source Oral, resp. rate 20, height 5' (1.524 m), weight 81.1 kg, SpO2 100 %.    Filed Weights   12/26/19 1000  Weight: 81.1 kg    Examination: GENERAL: in no acute distress HEENT: airway patent CARDIAC: heart RRR. Mild LE edema with the presence of varicosities bilaterally PULMONARY: breathing comfortably on 2L [place]. Bibasilar crackles ABDOMEN: soft. Nontender to palpation.  Nondistended.  NEURO: CN II-XII grossly intact. SKIN: small, well healing wounds to the bilateral lower extremtiies. No surrounding erythema, edema or drainage PSYCH: Normal affect  Significant Diagnostic Tests:  EKG: Sinus rhythm. RBBB. Left AFB. Unchanged from prior.  CXR: retrocardiac consolidation present.  Labs    CBC Latest Ref Rng & Units 12/26/2019 11/17/2019 08/16/2019  WBC 4.0 - 10.5 K/uL 10.5 8.3 6.1  Hemoglobin 12.0 - 15.0 g/dL 11.2(L) 11.3 11.1  Hematocrit 36.0 - 46.0 % 37.1 35.4 33.8(L)  Platelets 150 - 400 K/uL 196 223 227   BMP Latest Ref Rng & Units 12/26/2019 12/15/2019 11/23/2019  Glucose 70 - 99 mg/dL 218(H) 65 88  BUN 8 - 23 mg/dL 53(H) 44(H) 41(H)  Creatinine 0.44 - 1.00 mg/dL 2.55(H) 1.98(H) 1.91(H)  BUN/Creat Ratio 12 - 28 - 22 21  Sodium 135 - 145 mmol/L 146(H) 145(H) 148(H)  Potassium 3.5 - 5.1 mmol/L 5.5(H) 5.1 5.5(H)  Chloride 98 - 111 mmol/L 110 105 108(H)  CO2 22 - 32 mmol/L 26 28 23   Calcium 8.9 - 10.3 mg/dL 8.2(L) 8.6(L) 9.3     Summary  86 yo female with PMH of asthma, HFpEF, aortic stenosis, PAF who is presenting for 2d history of shortness of breath and found to have pneumonia.  Assessment & Plan:  Active Problems:   Pneumonia  Acute hypoxic respiratory failure secondary to Community Acquired Pneumonia Does not require  supplemental O2 at baseline. Currently requiring 3L Blood cultures pending. Overall, not really fitting sepsis picture however history of heart failure and asthma increase risk. COVID neg Plan Azithromycin--day #1/5; Rocephin--day #1/5 Tylenol for fever. Continue to monitor fever curve.  Chronic asthma. Continue Albuterol neb q6h prn, dulera. Wean O2 as able.  Chronic HFpEF. Severe AS. Follows with Dr. Meda Coffee in cardiology. When last seen in their office, BNP found to be 3200 which was likely from holding lasix for progressive renal disease. Presence of retrocardiac infiltrate put exacerbation lower on my list of differentials however would still tread carefully with IVF to avoid overload. Daily weights. Strict I/O Paroxysmal atrial fibrillation. Not on anticoagulation due to prior history of anemia/epistaxis. HAS-BLED 4--8.7% bleeding rate/year. CHADVASC 6-- 9.8%/yr risk of stroke. In SR today.   AKI on CKD IV. Baseline creatinine appears to be about 1.8-1.9s. 2.55 on admission. Likely from hypovolemia. Mild hyperkalemia/hypernatremia supportive of this. Expect acute component should resolve with return of euvolemia. Will continue to monitor with daily labs.  Best practice:  CODE STATUS: DNR Diet: cardiac DVT for prophylaxis: lovenox Dispo: Admit patient to Inpatient with expected length of stay greater than 2 midnights.   Mitzi Hansen, MD INTERNAL MEDICINE RESIDENT PGY-1 Pager # [phone] 12/26/19  2:20 PM

## 2019-12-26 NOTE — ED Provider Notes (Signed)
[facility] EMERGENCY DEPARTMENT Provider Note   CSN: [id number] Arrival date & time: 12/26/19  0830     History Chief Complaint  Patient presents with  . Cough  . Fever    Ann Sellers is a 86 y.o. female.  The history is provided by the patient.  Shortness of Breath Severity:  Mild Onset quality:  Gradual Duration:  2 days Timing:  Constant Progression:  Unchanged Chronicity:  New Relieved by:  Nothing Worsened by:  Nothing Associated symptoms: cough, fever and vomiting   Associated symptoms: no abdominal pain, no chest pain, no claudication, no ear pain, no headaches, no rash and no sore throat        Past Medical History:  Diagnosis Date  . Anemia   . Arthritis   . Asthma   . B12 deficiency   . Chronic diastolic CHF (congestive heart failure) ([place])   . CKD (chronic kidney disease), stage III   . Diabetes mellitus ([place])   . Hypertension   . Moderate aortic stenosis   . Neuromuscular disorder ([place])   . PAF (paroxysmal atrial fibrillation) ([place])   . Right rotator cuff tear   . Wears dentures    top denture-bottom partial  . Wears glasses   . Wears hearing aid    both ears    Patient Active Problem List   Diagnosis Date Noted  . PAF (paroxysmal atrial fibrillation) ([place]) 12/23/2016  . Aortic stenosis, moderate 09/16/2016  . Community acquired pneumonia 09/13/2016  . Lower extremity weakness 09/13/2016  . Acute on chronic diastolic CHF (congestive heart failure) ([place]) 09/13/2016  . Severe persistent chronic asthma without complication 66/44/0347  . Morbid obesity ([place]) 07/06/2015  . Edema 07/22/2014  . Varicose veins of lower extremities with complications 42/59/5638  . CKD (chronic kidney disease), stage III 02/01/2014  . Right rotator cuff tear   . Hypertension   . Arthritis   . Insulin dependent diabetes mellitus with complications   . Spinal stenosis   . Wears glasses   . Wears dentures   . Wears hearing aid   . Asthma  07/11/2011  . Vitamin D deficiency 07/11/2011  . Osteoarthritis 07/11/2011  . Cough 07/11/2011    Past Surgical History:  Procedure Laterality Date  . APPENDECTOMY  1945  . CATARACT EXTRACTION, BILATERAL  2008  . cervical stynosis  2005  . esophageal stretch  2001  . [place]  . herniated disc  1983  . left hand surgery  2011   gout  . left knee replacement  2002  . left knee surgery  1998  . right wrist fracture  2006  . SHOULDER ARTHROSCOPY WITH ROTATOR CUFF REPAIR Right 01/05/2013   Procedure: SHOULDER ARTHROSCOPY WITH ROTATOR CUFF REPAIR subchromial decompression and distal clavical excision;  Surgeon: Lorn Junes, MD;  Location: [facility];  Service: Orthopedics;  Laterality: Right;  . tonsillectomy  1936  . TONSILLECTOMY    . TYMPANOSTOMY TUBE PLACEMENT  1989  . VESICOVAGINAL FISTULA CLOSURE W/ TAH  1968     OB History   No obstetric history on file.     Family History  Problem Relation Age of Onset  . Pneumonia Mother   . Diabetes Son   . Hypertension Son   . Heart disease Sister   . Hypertension Sister   . Diabetes Sister   . Hypertension Sister   . Diabetes Daughter     Social History   Tobacco  Use  . Smoking status: Former Smoker    Packs/day: 0.50    Years: 20.00    Pack years: 10.00    Types: Cigarettes    Quit date: 10/22/1987    Years since quitting: 32.2  . Smokeless tobacco: Never Used  Substance Use Topics  . Alcohol use: Yes    Alcohol/week: 7.0 standard drinks    Types: 7 Glasses of wine per week    Comment: social drinker  . Drug use: No    Home Medications Prior to Admission medications   Medication Sig Start Date End Date Taking? Authorizing Provider  albuterol (PROVENTIL HFA;VENTOLIN HFA) 108 (90 BASE) MCG/ACT inhaler Inhale 2 puffs into the lungs every 6 (six) hours as needed for wheezing or shortness of breath.    Yes [provider]  albuterol (PROVENTIL) (2.5 MG/3ML) 0.083%  nebulizer solution Take 2.5 mg by nebulization every 6 (six) hours as needed for wheezing or shortness of breath.   Yes [provider]  allopurinol (ZYLOPRIM) 100 MG tablet Take 100 mg by mouth daily.     Yes [provider]  calcitRIOL (ROCALTROL) 0.25 MCG capsule Take 1 capsule by mouth daily. 06/28/15  Yes [provider]  calcium acetate (PHOSLO) 667 MG capsule Take 1 capsule (667 mg total) by mouth 3 (three) times daily with meals. 11/17/19  Yes Dorothy Spark, MD  diclofenac sodium (VOLTAREN) 1 % GEL Apply 2 g topically daily as needed (pain).   Yes [provider]  diphenoxylate-atropine (LOMOTIL) 2.5-0.025 MG per tablet Take 1 tablet by mouth daily as needed for diarrhea or loose stools.   Yes [provider]  fexofenadine (ALLEGRA) 180 MG tablet Take 1 tablet (180 mg total) by mouth daily. 04/25/16  Yes Tanda Rockers, MD  fluticasone (FLONASE) 50 MCG/ACT nasal spray Place 2 sprays into both nostrils daily. 07/08/17  Yes [provider]  furosemide (LASIX) 40 MG tablet TAKE 1 TABLET BY MOUTH EVERY DAY Patient taking differently: Take 40 mg by mouth daily. TAKE 1 TABLET BY MOUTH EVERY DAY 04/08/19  Yes Dorothy Spark, MD  gabapentin (NEURONTIN) 300 MG capsule Take 300 mg by mouth 3 (three) times daily. 04/01/17  Yes [provider]  GNP ASPIRIN LOW DOSE 81 MG EC tablet Take 81 mg by mouth daily. 07/15/17  Yes [provider]  HYDROcodone-acetaminophen (NORCO/VICODIN) 5-325 MG per tablet Take 0.5-1 tablets by mouth every 6 (six) hours as needed for moderate pain.  06/17/15  Yes [provider]  insulin glargine (LANTUS) 100 UNIT/ML injection Inject 20 Units into the skin at bedtime.    Yes [provider]  iron polysaccharides (NIFEREX) 150 MG capsule Take 150 mg by mouth daily.   Yes [provider]  JANUVIA 50 MG tablet Take 50 mg by mouth daily. 07/11/17  Yes [provider]    metoprolol succinate (TOPROL-XL) 50 MG 24 hr tablet TAKE 1 TABLET BY MOUTH EVERY DAY WITH FOOD OR IMMEDIATELY AFTER EATING Patient taking differently: Take 50 mg by mouth daily.  11/22/19  Yes Dorothy Spark, MD  mometasone-formoterol [facility]) 100-5 MCG/ACT AERO Take 2 puffs first thing in am and then another 2 puffs about 12 hours later. 08/22/15  Yes Tanda Rockers, MD  montelukast (SINGULAIR) 10 MG tablet Take 10 mg by mouth at bedtime.   Yes [provider]  omeprazole (PRILOSEC) 20 MG capsule Take 20 mg by mouth at bedtime.  11/11/17  Yes [provider]  omeprazole (PRILOSEC) 40 MG capsule Take 30-60 min before first meal of the day 11/23/15  Yes Tanda Rockers, MD  tiZANidine (ZANAFLEX) 4 MG tablet Take 4 mg by mouth every 8 (eight) hours as needed for muscle spasms.    Yes [provider]  Vitamin D, Ergocalciferol, (DRISDOL) 50000 units CAPS capsule Take 50,000 Units by mouth once a week. 09/20/16  Yes [provider]    Allergies    Losartan and Snake antivenin [antivenin crotalidae polyvalent (snake antivenin)]  Review of Systems   Review of Systems  Constitutional: Positive for fever. Negative for chills.  HENT: Negative for ear pain and sore throat.   Eyes: Negative for pain and visual disturbance.  Respiratory: Positive for cough and shortness of breath.   Cardiovascular: Negative for chest pain, palpitations and claudication.  Gastrointestinal: Positive for nausea and vomiting. Negative for abdominal pain.  Genitourinary: Negative for dysuria and hematuria.  Musculoskeletal: Negative for arthralgias and back pain.  Skin: Negative for color change and rash.  Neurological: Negative for seizures, syncope and headaches.  All other systems reviewed and are negative.   Physical Exam Updated Vital Signs  ED Triage Vitals  Enc Vitals Group     BP 12/26/19 0845 (!) 156/115     Pulse Rate 12/26/19 0845 78     Resp 12/26/19 0845 (!) 23      Temp 12/26/19 0845 (!) 101.6 F (38.7 C)     Temp Source 12/26/19 0845 Rectal     SpO2 12/26/19 0833 99 %     Weight 12/26/19 1000 178 lb 12.8 oz (81.1 kg)     Height 12/26/19 1000 5' (1.524 m)     Head Circumference --      Peak Flow --      Pain Score 12/26/19 1033 0     Pain Loc --      Pain Edu? --      Excl. in [place]? --     Physical Exam Vitals and nursing note reviewed.  Constitutional:      General: She is not in acute distress.    Appearance: She is well-developed. She is not ill-appearing.  HENT:     Head: Normocephalic and atraumatic.     Nose: Nose normal.     Mouth/Throat:     Mouth: Mucous membranes are moist.  Eyes:     Conjunctiva/sclera: Conjunctivae normal.     Pupils: Pupils are equal, round, and reactive to light.  Cardiovascular:     Rate and Rhythm: Normal rate and regular rhythm.     Pulses: Normal pulses.     Heart sounds: Normal heart sounds. No murmur.  Pulmonary:     Effort: Respiratory distress present.     Breath sounds: Rhonchi present.  Abdominal:     Palpations: Abdomen is soft.     Tenderness: There is no abdominal tenderness.  Musculoskeletal:     Cervical back: Neck supple.     Right lower leg: No edema.     Left lower leg: No edema.  Skin:    General: Skin is warm and dry.     Capillary Refill: Capillary refill takes less than 2 seconds.  Neurological:     General: No focal deficit present.     Mental Status: She is alert.  Psychiatric:        Mood and Affect: Mood normal.     ED Results / Procedures / Treatments   Labs (all labs ordered are listed, but  only abnormal results are displayed) Labs Reviewed  COMPREHENSIVE METABOLIC PANEL - Abnormal; Notable for the following components:      Result Value   Sodium 146 (*)    Potassium 5.5 (*)    Glucose, Bld 218 (*)    BUN 53 (*)    Creatinine, Ser 2.55 (*)    Calcium 8.2 (*)    Total Protein 5.7 (*)    Albumin 3.2 (*)    GFR calc non Af Amer 16 (*)    GFR calc Af Amer 18  (*)    All other components within normal limits  CBC WITH DIFFERENTIAL/PLATELET - Abnormal; Notable for the following components:   RBC 3.65 (*)    Hemoglobin 11.2 (*)    MCV 101.6 (*)    Neutro Abs 9.4 (*)    Lymphs Abs 0.6 (*)    All other components within normal limits  CULTURE, BLOOD (ROUTINE X 2)  CULTURE, BLOOD (ROUTINE X 2)  URINE CULTURE  SARS CORONAVIRUS 2 (TAT 6-24 HRS)  LACTIC ACID, PLASMA  APTT  PROTIME-INR  LACTIC ACID, PLASMA  URINALYSIS, ROUTINE W REFLEX MICROSCOPIC  POC SARS CORONAVIRUS 2 AG -  ED    EKG EKG Interpretation  Date/Time:  Sunday December 26 2019 08:48:58 EST Ventricular Rate:  77 PR Interval:    QRS Duration: 127 QT Interval:  373 QTC Calculation: 423 R Axis:   -74 Text Interpretation: Sinus rhythm RBBB and LAFB Confirmed by Lennice Sites [phone]) on 12/26/2019 9:44:48 AM   Radiology CT ABDOMEN PELVIS WO CONTRAST  Result Date: 12/26/2019 CLINICAL DATA:  86 year old female with history of abdominal distension fever. Vomiting. EXAM: CT ABDOMEN AND PELVIS WITHOUT CONTRAST TECHNIQUE: Multidetector CT imaging of the abdomen and pelvis was performed following the standard protocol without IV contrast. COMPARISON:  CT the abdomen and pelvis 11/21/1999. FINDINGS: Lower chest: Extensive airspace consolidation in the left lower lobe concerning for pneumonia. Ill-defined nodular density in the anterior aspect of the right lower lobe (axial image 13 of series 5) measuring 11 x 8 mm. Aortic atherosclerosis. Hepatobiliary: No suspicious cystic or solid hepatic lesions confidently identified on today's noncontrast CT examination. Small partially calcified gallstones lying dependently in the gallbladder. No findings to suggest an acute cholecystitis. Pancreas: No definite pancreatic mass or peripancreatic fluid collections or inflammatory changes are noted on today's noncontrast CT examination. Spleen: Unremarkable. Adrenals/Urinary Tract: Calcifications in both renal  hila which appear to be vascular. No definite calculi are noted within the collecting system of either kidney, along the course of either ureter, or within the lumen of the urinary bladder. No hydroureteronephrosis. There are 2 right-sided renal lesions which range from intermediate to low attenuation, incompletely characterized on today's non-contrast CT examination, but statistically likely to represent cysts of varying degrees of complexity. Unenhanced appearance of the left kidney and bilateral adrenal glands is otherwise unremarkable. Urinary bladder is normal in appearance. Stomach/Bowel: Unenhanced appearance of the stomach is normal. No pathologic dilatation of small bowel or colon. Numerous colonic diverticulae are noted, particularly in the sigmoid colon, without surrounding inflammatory changes to suggest an acute diverticulitis at this time. The appendix is not confidently identified and may be surgically absent. Regardless, there are no inflammatory changes noted adjacent to the cecum to suggest the presence of an acute appendicitis at this time. Vascular/Lymphatic: Aortic atherosclerosis. No lymphadenopathy noted in the abdomen or pelvis. Reproductive: Status post hysterectomy. 4.3 cm low-attenuation lesion in the right adnexal region, incompletely characterized on today's non-contrast CT examination,  but statistically likely to represent an ovarian cyst. Other: No significant volume of ascites.  No pneumoperitoneum. Musculoskeletal: There are no aggressive appearing lytic or blastic lesions noted in the visualized portions of the skeleton. IMPRESSION: 1. No acute findings are noted in the abdomen or pelvis to account for the patient's symptoms. 2. However, there is extensive airspace consolidation in the left lower lobe, compatible with pneumonia. 3. Small nodular density in the right lower lobe measuring 11 x 8 mm, favored to be infectious or inflammatory, however, attention on follow-up studies  should be considered if clinically appropriate. Consider repeat noncontrast chest CT in 3 months. This recommendation follows the consensus statement: Guidelines for Management of Incidental Pulmonary Nodules Detected on CT Images: From the Fleischner Society 2017; Radiology 2017; 284:228-243. 4. 4.3 cm low-attenuation lesion in the right adnexal region, presumably an ovarian cyst. Further evaluation with nonemergent pelvic ultrasound is recommended in the near future to exclude the possibility of neoplasm. This recommendation follows ACR consensus guidelines: White Paper of the ACR Incidental Findings Committee II on Adnexal Findings. J Am Coll Radiol 2013:10:675-681. 5. Cholelithiasis without evidence of acute cholecystitis at this time. 6. Colonic diverticulosis without evidence of acute diverticulitis at this time. 7. Aortic atherosclerosis. Electronically Signed   By: Vinnie Langton M.D.   On: 12/26/2019 09:47   DG Chest Port 1 View  Result Date: 12/26/2019 CLINICAL DATA:  Cough EXAM: PORTABLE CHEST 1 VIEW COMPARISON:  09/12/2016 FINDINGS: Chronic interstitial prominence. Increased retrocardiac density. No significant pleural effusion. No pneumothorax. Stable cardiomediastinal contours. IMPRESSION: Retrocardiac atelectasis/consolidation. Electronically Signed   By: Macy Mis M.D.   On: 12/26/2019 09:30    Procedures .Critical Care Performed by: Lennice Sites, DO Authorized by: Lennice Sites, DO   Critical care provider statement:    Critical care time (minutes):  40   Critical care was necessary to treat or prevent imminent or life-threatening deterioration of the following conditions:  Sepsis and respiratory failure   Critical care was time spent personally by me on the following activities:  Blood draw for specimens, development of treatment plan with patient or surrogate, evaluation of patient's response to treatment, discussions with primary provider, examination of patient, obtaining  history from patient or surrogate, ordering and performing treatments and interventions, ordering and review of laboratory studies, ordering and review of radiographic studies, pulse oximetry, re-evaluation of patient's condition and review of old charts   I assumed direction of critical care for this patient from another provider in my specialty: no     (including critical care time)  Medications Ordered in ED Medications  vancomycin (VANCOREADY) IVPB 1500 mg/300 mL (1,500 mg Intravenous New Bag/Given 12/26/19 1005)  vancomycin (VANCOREADY) IVPB 500 mg/100 mL (has no administration in time range)  sodium zirconium cyclosilicate (LOKELMA) packet 5 g (has no administration in time range)  acetaminophen (TYLENOL) tablet 650 mg (650 mg Oral Given 12/26/19 0958)  piperacillin-tazobactam (ZOSYN) IVPB 3.375 g (0 g Intravenous Stopped 12/26/19 1018)  sodium chloride 0.9 % bolus 1,000 mL (1,000 mLs Intravenous New Bag/Given 12/26/19 1002)  sodium chloride 0.9 % bolus 1,000 mL (1,000 mLs Intravenous New Bag/Given 12/26/19 1049)    ED Course  I have reviewed the triage vital signs and the nursing notes.  Pertinent labs & imaging results that were available during my care of the patient were reviewed by me and considered in my medical decision making (see chart for details).    MDM Rules/Calculators/A&P  MALIA CORSI is a 86 year old female with history of hypertension, diabetes, heart failure who presents to the ED with cough, shortness of breath, vomiting.  Patient with fever and hypoxia upon arrival.  Improved on 2 L of oxygen.  Code sepsis initiated.  Concern for respiratory infection.  Possibly bowel obstruction.  Patient had cough and vomiting and shortness of breath.  Possible aspiration.  Denies any current abdominal pain.  No urinary symptoms.  No chest pain.  No history of coronavirus.  Had her first vaccination several weeks ago.  Patient given empiric antibiotics.  Blood  pressure did appear to get low however patient was lying on her blood pressure cuff.  Never had blood pressure below 90 systolic with adjusted blood pressure cuff.  2 L of normal saline were ordered.  Lactic acid was normal.  Patient with no significant leukocytosis.  CT scan of the abdomen and pelvis showed left lower lobe pneumonia which was also seen on chest x-ray.  Covid negative.  Given fever, tachypnea, hypoxia suspect patient with sepsis from pneumonia.  Hemodynamically improved after IV fluids.  Admitted to internal medicine service for further care.  This chart was dictated using voice recognition software.  Despite best efforts to proofread,  errors can occur which can change the documentation meaning.   Deysi Soldo Doenges was evaluated in Emergency Department on 12/26/2019 for the symptoms described in the history of present illness. She was evaluated in the context of the global COVID-19 pandemic, which necessitated consideration that the patient might be at risk for infection with the SARS-CoV-2 virus that causes COVID-19. Institutional protocols and algorithms that pertain to the evaluation of patients at risk for COVID-19 are in a state of rapid change based on information released by regulatory bodies including the CDC and federal and state organizations. These policies and algorithms were followed during the patient's care in the ED.   Final Clinical Impression(s) / ED Diagnoses Final diagnoses:  Sepsis, due to unspecified organism, unspecified whether acute organ dysfunction present [facility])  Community acquired pneumonia, unspecified laterality  Aspiration pneumonia, unspecified aspiration pneumonia type, unspecified laterality, unspecified part of lung [facility])    Rx / DC Orders ED Discharge Orders    None       Lennice Sites, DO 12/26/19 1135

## 2019-12-26 NOTE — Progress Notes (Signed)
Pharmacy Antibiotic Note  Ann Sellers is a 86 y.o. female admitted on 12/26/2019 with sepsis.  Pharmacy has been consulted for vancomycin dosing. Pt is febrile with TMax 101.6 but WBC is WNL. Scr is elevated above baseline. Lactic acid is <2.   Plan: Vancomycin 1500mg  IV x 1 then 500mg  IV Q48H F/u renal fxn, C&S, clinical status and peak/trough at [facility] F/u continuation of gram negative coverage    Temp (24hrs), Avg:101.6 F (38.7 C), Min:101.6 F (38.7 C), Max:101.6 F (38.7 C)  Recent Labs  Lab 12/26/19 0913  WBC 10.5  CREATININE 2.55*  LATICACIDVEN 1.5    Estimated Creatinine Clearance: 13 mL/min (A) (by C-G formula based on SCr of 2.55 mg/dL (H)).    Allergies  Allergen Reactions  . Losartan Cough  . Snake Antivenin [Antivenin Crotalidae Polyvalent (Snake Antivenin)] Other (See Comments)    PARALYSIS    Antimicrobials this admission: Vanc 3/7>> Zosyn x 1 3/7  Dose adjustments this admission: N/A  Microbiology results: Pending  Thank you for allowing pharmacy to be a part of this patient's care.  Arno Cullers, Rande Lawman 12/26/2019 9:18 AM

## 2019-12-27 DIAGNOSIS — I350 Nonrheumatic aortic (valve) stenosis: Secondary | ICD-10-CM

## 2019-12-27 DIAGNOSIS — E1122 Type 2 diabetes mellitus with diabetic chronic kidney disease: Secondary | ICD-10-CM

## 2019-12-27 DIAGNOSIS — N189 Chronic kidney disease, unspecified: Secondary | ICD-10-CM

## 2019-12-27 DIAGNOSIS — X58XXXA Exposure to other specified factors, initial encounter: Secondary | ICD-10-CM

## 2019-12-27 DIAGNOSIS — D539 Nutritional anemia, unspecified: Secondary | ICD-10-CM

## 2019-12-27 DIAGNOSIS — Z66 Do not resuscitate: Secondary | ICD-10-CM

## 2019-12-27 DIAGNOSIS — R011 Cardiac murmur, unspecified: Secondary | ICD-10-CM

## 2019-12-27 DIAGNOSIS — N179 Acute kidney failure, unspecified: Secondary | ICD-10-CM

## 2019-12-27 DIAGNOSIS — H9190 Unspecified hearing loss, unspecified ear: Secondary | ICD-10-CM

## 2019-12-27 DIAGNOSIS — J690 Pneumonitis due to inhalation of food and vomit: Principal | ICD-10-CM

## 2019-12-27 DIAGNOSIS — S71111A Laceration without foreign body, right thigh, initial encounter: Secondary | ICD-10-CM

## 2019-12-27 DIAGNOSIS — I480 Paroxysmal atrial fibrillation: Secondary | ICD-10-CM

## 2019-12-27 DIAGNOSIS — H539 Unspecified visual disturbance: Secondary | ICD-10-CM

## 2019-12-27 DIAGNOSIS — R9431 Abnormal electrocardiogram [ECG] [EKG]: Secondary | ICD-10-CM

## 2019-12-27 DIAGNOSIS — I5032 Chronic diastolic (congestive) heart failure: Secondary | ICD-10-CM

## 2019-12-27 DIAGNOSIS — E875 Hyperkalemia: Secondary | ICD-10-CM

## 2019-12-27 DIAGNOSIS — I4510 Unspecified right bundle-branch block: Secondary | ICD-10-CM

## 2019-12-27 LAB — COMPREHENSIVE METABOLIC PANEL
ALT: 13 U/L (ref 0–44)
AST: 18 U/L (ref 15–41)
Albumin: 3.1 g/dL — ABNORMAL LOW (ref 3.5–5.0)
Alkaline Phosphatase: 63 U/L (ref 38–126)
Anion gap: 13 (ref 5–15)
BUN: 53 mg/dL — ABNORMAL HIGH (ref 8–23)
CO2: 23 mmol/L (ref 22–32)
Calcium: 8.2 mg/dL — ABNORMAL LOW (ref 8.9–10.3)
Chloride: 109 mmol/L (ref 98–111)
Creatinine, Ser: 2.44 mg/dL — ABNORMAL HIGH (ref 0.44–1.00)
GFR calc Af Amer: 19 mL/min — ABNORMAL LOW (ref >60)
GFR calc non Af Amer: 17 mL/min — ABNORMAL LOW (ref >60)
Glucose, Bld: 69 mg/dL — ABNORMAL LOW (ref 70–99)
Potassium: 4.9 mmol/L (ref 3.5–5.1)
Sodium: 145 mmol/L (ref 135–145)
Total Bilirubin: 0.8 mg/dL (ref 0.3–1.2)
Total Protein: 5.8 g/dL — ABNORMAL LOW (ref 6.5–8.1)

## 2019-12-27 LAB — CBC WITH DIFFERENTIAL/PLATELET
Abs Immature Granulocytes: 0.04 10*3/uL (ref 0.00–0.07)
Basophils Absolute: 0 10*3/uL (ref 0.0–0.1)
Basophils Relative: 1 %
Eosinophils Absolute: 0.1 10*3/uL (ref 0.0–0.5)
Eosinophils Relative: 1 %
HCT: 36.7 % (ref 36.0–46.0)
Hemoglobin: 10.8 g/dL — ABNORMAL LOW (ref 12.0–15.0)
Immature Granulocytes: 1 %
Lymphocytes Relative: 11 %
Lymphs Abs: 0.9 10*3/uL (ref 0.7–4.0)
MCH: 30.8 pg (ref 26.0–34.0)
MCHC: 29.4 g/dL — ABNORMAL LOW (ref 30.0–36.0)
MCV: 104.6 fL — ABNORMAL HIGH (ref 80.0–100.0)
Monocytes Absolute: 0.5 10*3/uL (ref 0.1–1.0)
Monocytes Relative: 6 %
Neutro Abs: 6.8 10*3/uL (ref 1.7–7.7)
Neutrophils Relative %: 80 %
Platelets: 147 10*3/uL — ABNORMAL LOW (ref 150–400)
RBC: 3.51 MIL/uL — ABNORMAL LOW (ref 3.87–5.11)
RDW: 14.9 % (ref 11.5–15.5)
WBC: 8.4 10*3/uL (ref 4.0–10.5)
nRBC: 0 % (ref 0.0–0.2)

## 2019-12-27 LAB — GLUCOSE, CAPILLARY
Glucose-Capillary: 73 mg/dL (ref 70–99)
Glucose-Capillary: 101 mg/dL — ABNORMAL HIGH (ref 70–99)
Glucose-Capillary: 89 mg/dL (ref 70–99)
Glucose-Capillary: 94 mg/dL (ref 70–99)

## 2019-12-27 LAB — MAGNESIUM: Magnesium: 1.5 mg/dL — ABNORMAL LOW (ref 1.7–2.4)

## 2019-12-27 MED ORDER — MAGNESIUM SULFATE 2 GM/50ML IV SOLN
2.0000 g | Freq: Once | INTRAVENOUS | Status: AC
  Administered 2019-12-27: 2 g via INTRAVENOUS
  Filled 2019-12-27: qty 50

## 2019-12-27 MED ORDER — INSULIN ASPART 100 UNIT/ML ~~LOC~~ SOLN: 0.0000 [IU] | Freq: Three times a day (TID) | SUBCUTANEOUS | Status: DC

## 2019-12-27 MED ORDER — SODIUM CHLORIDE 0.9 % IV SOLN
1.5000 g | Freq: Two times a day (BID) | INTRAVENOUS | Status: DC
  Administered 2019-12-27 – 2020-01-02 (×12): 1.5 g via INTRAVENOUS
  Filled 2019-12-27 (×4): qty 1.5
  Filled 2019-12-27: qty 4
  Filled 2019-12-27 (×2): qty 1.5
  Filled 2019-12-27: qty 4
  Filled 2019-12-27 (×2): qty 1.5
  Filled 2019-12-27: qty 4
  Filled 2019-12-27 (×2): qty 1.5

## 2019-12-27 NOTE — Evaluation (Signed)
Occupational Therapy Evaluation Patient Details Name: Ann Sellers MRN: [id number] DOB: 08-15-1925 Today's Date: 12/27/2019    History of Present Illness Pt is a 86 year old woman admitted on 12/26/18 with progressive SOB secondary to PNA. PMH: CHF, anemia, asthma, B12 deficiency, CKD IV, DM, HTN, moderate aortic stenosis, afib, R rotator cuff tear, HOH, vision impairment.   Clinical Impression   Pt is typically modified independent with use of rollator and uses 3L02. Presents with impaired vision, decreased balance, generalized weakness and decreased safety awareness. Pt requires min to total assist for ADL and +2 mod to min assist for mobility.Pt with Sp02 of 100% on 3L. Recommending further rehab in SNF prior to return home with her son. Will follow acutely.    Follow Up Recommendations  SNF;Supervision/Assistance - 24 hour    Equipment Recommendations  Other (comment)(defer to next venue)    Recommendations for Other Services       Precautions / Restrictions Precautions Precautions: Fall Precaution Comments: Pt with low vision. Restrictions Weight Bearing Restrictions: No      Mobility Bed Mobility Overal bed mobility: Needs Assistance Bed Mobility: Supine to Sit     Supine to sit: Mod assist     General bed mobility comments: physical assist to locate rail, assist to raise trunk and position hips at EOB, reports she sleeps in a chair at home  Transfers Overall transfer level: Needs assistance Equipment used: Rolling walker (2 wheeled) Transfers: Sit to/from [facility] Sit to Stand: Mod assist;+2 physical assistance;+2 safety/equipment Stand pivot transfers: +2 physical assistance;Min assist       General transfer comment: +2 mod from bed, +1 mod from [facility], physical assist to move walker and guidance due to poor vision and safety awareness    Balance Overall balance assessment: Needs assistance Sitting-balance support: Bilateral upper extremity  supported Sitting balance-Leahy Scale: Poor   Postural control: Posterior lean Standing balance support: Bilateral upper extremity supported Standing balance-Leahy Scale: Poor                             ADL either performed or assessed with clinical judgement   ADL Overall ADL's : Needs assistance/impaired Eating/Feeding: Minimal assistance;Sitting Eating/Feeding Details (indicate cue type and reason): set up, cues for placement of food, reaching randomly Grooming: Wash/dry hands;Sitting;Set up   Upper Body Bathing: Maximal assistance;Sitting   Lower Body Bathing: Total assistance;Sit to/from stand;+2 for physical assistance   Upper Body Dressing : Maximal assistance;Sitting   Lower Body Dressing: Total assistance;+2 for physical assistance;Sit to/from stand   Toilet Transfer: +2 for physical assistance;Minimal assistance;Stand-pivot;RW   Toileting- Clothing Manipulation and Hygiene: Total assistance;Sit to/from stand       Functional mobility during ADLs: +2 for physical assistance;Minimal assistance;Rolling walker;Cueing for safety;Cueing for sequencing       Vision Patient Visual Report: No change from baseline Additional Comments: very poor acuity     Perception     Praxis      Pertinent Vitals/Pain Pain Assessment: Faces Faces Pain Scale: No hurt     Hand Dominance Right   Extremity/Trunk Assessment Upper Extremity Assessment Upper Extremity Assessment: Generalized weakness;Overall [facility] for tasks assessed   Lower Extremity Assessment Lower Extremity Assessment: Defer to PT evaluation   Cervical / Trunk Assessment Cervical / Trunk Assessment: Other exceptions Cervical / Trunk Exceptions: obesity   Communication Communication Communication: HOH   Cognition Arousal/Alertness: Awake/alert Behavior During Therapy: Flat affect Overall Cognitive Status: Impaired/Different  from baseline Area of Impairment: Memory;Following  commands;Safety/judgement;Awareness;Problem solving                     Memory: Decreased short-term memory Following Commands: Follows one step commands with increased time;Follows one step commands inconsistently Safety/Judgement: Decreased awareness of safety;Decreased awareness of deficits Awareness: Emergent Problem Solving: Slow processing;Decreased initiation;Difficulty sequencing;Requires verbal cues;Requires tactile cues     General Comments       Exercises     Shoulder Instructions      Home Living Family/patient expects to be discharged to:: Private residence Living Arrangements: Children(son) Available Help at Discharge: Family;Available PRN/intermittently(son works part time) Type of Home: House Home Access: Stairs to enter Technical brewer of Steps: 3 Entrance Stairs-Rails: Can reach both [place]: One level     Bathroom Shower/Tub: Occupational psychologist: Handicapped height Bathroom Accessibility: Yes How Accessible: Accessible via walker Home Equipment: [place] - 4 wheels;Grab bars - tub/shower;Hand held shower head;Other (comment);Adaptive equipment(02) Adaptive Equipment: Sock aid Additional Comments: "I don't want a shower seat, I have plenty of rods."      Prior Functioning/Environment Level of Independence: Independent with assistive device(s)        Comments: able to walk into Dr's office with Rollator, however last 3 days has not been able, reports independence in self care and works together with son on housekeeping and meal prep         OT Problem List: Decreased strength;Decreased activity tolerance;Impaired balance (sitting and/or standing);Decreased coordination;Decreased cognition;Impaired vision/perception;Decreased knowledge of use of DME or AE;Decreased safety awareness;Cardiopulmonary status limiting activity;Obesity      OT Treatment/Interventions: Self-care/ADL training;DME and/or AE  instruction;Therapeutic activities;Visual/perceptual remediation/compensation;Patient/family education;Balance training    OT Goals(Current goals can be found in the care plan section) Acute Rehab OT Goals Patient Stated Goal: to eat OT Goal Formulation: With patient Time For Goal Achievement: 01/10/20 Potential to Achieve Goals: Good ADL Goals Pt Will Perform Grooming: standing;with min assist Pt Will Perform Upper Body Dressing: with min assist;sitting Pt Will Perform Lower Body Dressing: with min assist;with adaptive equipment;sit to/from stand Pt Will Transfer to Toilet: with min assist;ambulating;bedside commode Pt Will Perform Toileting - Clothing Manipulation and hygiene: with min assist;sit to/from stand Additional ADL Goal #1: Pt will participate in ADL in sitting without LOB.  OT Frequency: Min 2X/week   Barriers to D/C: Decreased caregiver support          Co-evaluation PT/OT/SLP Co-Evaluation/Treatment: Yes Reason for Co-Treatment: For patient/therapist safety PT goals addressed during session: Mobility/safety with mobility OT goals addressed during session: ADL's and self-care;Proper use of Adaptive equipment and DME      AM-PAC OT "6 Clicks" Daily Activity     Outcome Measure Help from another person eating meals?: A Little Help from another person taking care of personal grooming?: A Little Help from another person toileting, which includes using toliet, bedpan, or urinal?: Total Help from another person bathing (including washing, rinsing, drying)?: A Lot Help from another person to put on and taking off regular upper body clothing?: A Lot Help from another person to put on and taking off regular lower body clothing?: Total 6 Click Score: 12   End of Session Equipment Utilized During Treatment: Gait belt;Rolling walker;Oxygen(3L) Nurse Communication: Mobility status  Activity Tolerance: Patient limited by fatigue Patient left: in chair;with call bell/phone  within reach;with chair alarm set  OT Visit Diagnosis: Unsteadiness on feet (R26.81);Other abnormalities of gait and mobility (R26.89);Pain;Other symptoms and signs involving cognitive  function;Low vision, both eyes (H54.2);Muscle weakness (generalized) (M62.81)                Time: 1595-3967 OT Time Calculation (min): 35 min Charges:  OT General Charges $OT Visit: 1 Visit OT Evaluation $OT Eval Moderate Complexity: 1 Mod  Nestor Lewandowsky, OTR/L Acute Rehabilitation Services Pager: [phone] Office: [phone]  Ann Sellers 12/27/2019, 10:16 AM

## 2019-12-27 NOTE — Progress Notes (Signed)
Patient was fed her lunch as she has difficulty seeing and does not seem to be able to find her dinner even if told or pointed out where it is.  She ate about 35% of her lunch and drank some decaf coffee, and wanted to rest.  She was in the chair position in bed.  Approximately ten minutes later, I was rounding past her room and heard her coughing.  When I got to the bedside she had salad in her mouth and was coughing very hard.  Wound care nurse was at the bedside as well and her oxygen saturation dropped to the 80's and I requested wound care nurse to  increase her oxygen to 6 liters.  She seemed to finish coughing and her oxygen came back up to high 90's and is now being maintained there with 2 Liters [place].  Dr. Darrick Meigs was paged for possible need for chest xray, and patient will be NPO until further follow up.  Also notified rapid response team after the incident was over, respiratory was paged as well, but patient recovered quickly and was saturating well by that time.

## 2019-12-27 NOTE — Evaluation (Signed)
Physical Therapy Evaluation Patient Details Name: Ann Sellers MRN: [id number] DOB: 16-Oct-1925 Today's Date: 12/27/2019   History of Present Illness  Pt is a 86 year old woman admitted on 12/26/18 with progressive SOB secondary to PNA. PMH: CHF, anemia, asthma, B12 deficiency, CKD IV, DM, HTN, moderate aortic stenosis, afib, R rotator cuff tear, HOH, vision impairment.  Clinical Impression  PTA living with son who works part-time in a single story home with 3 steps to enter. Pt reports that last week pt was able to walk limited community distances with RW and assist with cooking and cleaning, however it is likely it was more than a week ago that she was able to perform at that level. Pt is limited in safe mobility by decreased vision, increased work of breathing, and poor safety awareness in presence of decreased strength and endurance. Pt requires modAx2 for bed mobility, transfers and ambulation of 10 feet with RW. PT recommending SNF level rehab at discharge. PT will continue to follow acutely.     Follow Up Recommendations SNF    Equipment Recommendations  Other (comment)(TBD at next venue)       Precautions / Restrictions Precautions Precautions: Fall Precaution Comments: Pt with low vision. Restrictions Weight Bearing Restrictions: No      Mobility  Bed Mobility Overal bed mobility: Needs Assistance Bed Mobility: Supine to Sit     Supine to sit: Mod assist     General bed mobility comments: physical assist to locate rail, assist to raise trunk and position hips at EOB, reports she sleeps in a chair at home  Transfers Overall transfer level: Needs assistance Equipment used: Rolling walker (2 wheeled) Transfers: Sit to/from [facility] Sit to Stand: Mod assist;+2 physical assistance;+2 safety/equipment Stand pivot transfers: +2 physical assistance;Min assist       General transfer comment: +2 mod from bed, +1 mod from [facility], physical assist to move walker  and guidance due to poor vision and safety awareness  Ambulation/Gait Ambulation/Gait assistance: Mod assist;+2 physical assistance;+2 safety/equipment Gait Distance (Feet): 10 Feet Assistive device: Rolling walker (2 wheeled) Gait Pattern/deviations: Step-through pattern;Decreased step length - right;Decreased step length - left;Shuffle;Trunk flexed Gait velocity: slowed Gait velocity interpretation: 1.31 - 2.62 ft/sec, indicative of limited community ambulator General Gait Details: modAx2 for safety, pt extremely limited by poor vision and decreased safety awareness,taking UE off walker and trying to hold onto the sink despite maximal cuing for keeping hands on RW and walking straight        Balance Overall balance assessment: Needs assistance Sitting-balance support: Bilateral upper extremity supported Sitting balance-Leahy Scale: Poor   Postural control: Posterior lean Standing balance support: Bilateral upper extremity supported Standing balance-Leahy Scale: Poor                               Pertinent Vitals/Pain Pain Assessment: Faces Faces Pain Scale: No hurt    Home Living Family/patient expects to be discharged to:: Private residence Living Arrangements: Children(son) Available Help at Discharge: Family;Available PRN/intermittently(son works part time) Type of Home: House Home Access: Stairs to enter Entrance Stairs-Rails: Can reach both Technical brewer of Steps: 3 Home Layout: One level Home Equipment: Walker - 4 wheels;Grab bars - tub/shower;Hand held shower head;Other (comment);Adaptive equipment(02) Additional Comments: "I don't want a shower seat, I have plenty of rods."    Prior Function Level of Independence: Independent with assistive device(s)         Comments:  able to walk into Dr's office with Rollator, however last 3 days has not been able, reports independence in self care and works together with son on housekeeping and meal  prep      Hand Dominance   Dominant Hand: Right    Extremity/Trunk Assessment   Upper Extremity Assessment Upper Extremity Assessment: Defer to OT evaluation    Lower Extremity Assessment Lower Extremity Assessment: Generalized weakness    Cervical / Trunk Assessment Cervical / Trunk Assessment: Other exceptions Cervical / Trunk Exceptions: obesity  Communication   Communication: HOH  Cognition Arousal/Alertness: Awake/alert Behavior During Therapy: Flat affect Overall Cognitive Status: Impaired/Different from baseline Area of Impairment: Memory;Following commands;Safety/judgement;Awareness;Problem solving                     Memory: Decreased short-term memory Following Commands: Follows one step commands with increased time;Follows one step commands inconsistently Safety/Judgement: Decreased awareness of safety;Decreased awareness of deficits Awareness: Emergent Problem Solving: Slow processing;Decreased initiation;Difficulty sequencing;Requires verbal cues;Requires tactile cues        General Comments General comments (skin integrity, edema, etc.): Pt with 3L O2 at baseline, difficult to monitor due to dark red nail polish, with ambulation increased WoB and increased supplemental O2 to 4L SaO2 estabilished with poor wave in mid 80s. Once pt in chair returned to 3L O2 and able to maintain SaO2 93%O2.         Assessment/Plan    PT Assessment Patient needs continued PT services  PT Problem List Decreased strength;Decreased activity tolerance;Decreased balance;Decreased mobility;Decreased cognition;Decreased safety awareness;Decreased knowledge of use of DME;Cardiopulmonary status limiting activity;Other (comment)(poor hearing and vision)       PT Treatment Interventions DME instruction;Gait training;Functional mobility training;Therapeutic activities;Therapeutic exercise;Balance training;Cognitive remediation    PT Goals (Current goals can be found in the  Care Plan section)  Acute Rehab PT Goals Patient Stated Goal: to eat PT Goal Formulation: With patient Time For Goal Achievement: 01/10/20 Potential to Achieve Goals: Fair    Frequency Min 2X/week   Barriers to discharge Decreased caregiver support      Co-evaluation PT/OT/SLP Co-Evaluation/Treatment: Yes Reason for Co-Treatment: For patient/therapist safety PT goals addressed during session: Mobility/safety with mobility OT goals addressed during session: ADL's and self-care       AM-PAC PT "6 Clicks" Mobility  Outcome Measure Help needed turning from your back to your side while in a flat bed without using bedrails?: A Lot Help needed moving from lying on your back to sitting on the side of a flat bed without using bedrails?: Total Help needed moving to and from a bed to a chair (including a wheelchair)?: Total Help needed standing up from a chair using your arms (e.g., wheelchair or bedside chair)?: Total Help needed to walk in hospital room?: Total Help needed climbing 3-5 steps with a railing? : Total 6 Click Score: 7    End of Session Equipment Utilized During Treatment: Gait belt;Oxygen Activity Tolerance: Other (comment)(limited by poor vision and hearing ) Patient left: in chair;with call bell/phone within reach;with chair alarm set Nurse Communication: Mobility status;Other (comment)(O2 desaturation) PT Visit Diagnosis: Unsteadiness on feet (R26.81);Other abnormalities of gait and mobility (R26.89);Muscle weakness (generalized) (M62.81);History of falling (Z91.81);Difficulty in walking, not elsewhere classified (R26.2)    Time: 9211-9417 PT Time Calculation (min) (ACUTE ONLY): 32 min   Charges:   PT Evaluation $PT Eval Moderate Complexity: 1 Mod          Dominica Kent B. Migdalia Dk PT, DPT Acute Environmental education officer (  [phone] Office [phone]   [place] 12/27/2019, 1:36 PM

## 2019-12-27 NOTE — Progress Notes (Signed)
NAME:  Ann Sellers, MRN:  [id number], DOB:  03-Jan-1925, LOS: 1 ADMISSION DATE:  12/26/2019  Subjective/Interm history  Hypoglycemic overnight VSS Son updated via telephone call. Notes that she will be due for her 2nd moderna shot this upcoming Friday and is inquiring whether or not she will be able to receive it. Discussed that I'm not sure at this time however will check into it and let him know. 1:16 PM received page regarding an episode concerning for aspiration. Bedside RN notes that she was feeding the patient while she was sitting upright. The patient developed coughing and O2 sats dropped down to low 80s. O2 sats recovered after being placed on 6L and she has since been able to be titrated back down to 3L.  Objective   Blood pressure (!) 155/88, pulse 66, temperature 99.1 F (37.3 C), temperature source Oral, resp. rate 18, height 5' (1.524 m), weight 81.1 kg, SpO2 98 %.     Intake/Output Summary (Last 24 hours) at 12/27/2019 1316 Last data filed at 12/27/2019 1000 Gross per 24 hour  Intake 377.39 ml  Output 625 ml  Net -247.61 ml   Filed Weights   12/26/19 1000  Weight: 81.1 kg    Examination: GENERAL: in no acute distress CARDIAC: heart RRR. No peripheral edema.  PULMONARY: acyanotic. Lung sounds clear to auscultation.  NEURO: alert and oriented  Significant Diagnostic Tests:  3/7 CXR: retrocardiac opacity 3/7 CT a/p: LLL airspace opacity. RLL nodular density--infectious vs inflammatory--repeat noncontrast CT in 59mo. 4.3cm low attenuation lesion in right adnexal region, presumably ovarian cyst. furthe revaluation with nonemergent pelvic ultrasound recommended to r/o neoplasm.   Micro Data:  3/7 blood culture>NGTD  Antimicrobials:  Azithromycin 3/7>>3/8 Rocephin 3/7>>3/8 Unasyn 3/8>>  Labs    CBC Latest Ref Rng & Units 12/27/2019 12/26/2019 11/17/2019  WBC 4.0 - 10.5 K/uL 8.4 10.5 8.3  Hemoglobin 12.0 - 15.0 g/dL 10.8(L) 11.2(L) 11.3  Hematocrit 36.0 - 46.0 % 36.7  37.1 35.4  Platelets 150 - 400 K/uL 147(L) 196 223   BMP Latest Ref Rng & Units 12/27/2019 12/26/2019 12/26/2019  Glucose 70 - 99 mg/dL 69(L) 50(L) 102(H)  BUN 8 - 23 mg/dL 53(H) 53(H) 53(H)  Creatinine 0.44 - 1.00 mg/dL 2.44(H) 2.43(H) 2.48(H)  BUN/Creat Ratio 12 - 28 - - -  Sodium 135 - 145 mmol/L 145 144 143  Potassium 3.5 - 5.1 mmol/L 4.9 4.8 5.6(H)  Chloride 98 - 111 mmol/L 109 110 110  CO2 22 - 32 mmol/L 23 24 25   Calcium 8.9 - 10.3 mg/dL 8.2(L) 7.9(L) 7.3(L)    Summary  86 yo female presenting with 1d history of shortness of breath and fever who was found to have a retrocardiac infiltrate that is presumably CAP.   Assessment & Plan:  Active Problems:   Pneumonia  Aspiration pneumonia Initially thought to be CAP however after further discussion with her son this morning, most likely aspiration as the shortness of breath occurred soon after emesis. On exam this morning, she did look a little worse than yesterday. The likely aspiration event this afternoon is additionally concerning and requires further evaluation. Afebrile overnight. Remains on 3L supplemental O2. No home O2 requirement. No leukocytosis.  Blood cultures pending Plan Will switch to unasyn to complete a 7-10d course. D/c ceftriaxone and azithro Wean supplemental O2 as able ST consulted for swallow evaluation.   T2DM. CBGs 50s-60s overnight. Plan: SSI transitioned to sensitive. Will continue to monitor.  Paroxysmal atrial fibrillation not on anticoagulation due  to history of bleeding. Follows with Dr. Meda Coffee in cardiology. In SR on admission.  Chronic HFpEF with severe AS. Does not seem hypervolemic on admission. Abnormal EKG findings. RBBB and dropped beats on admission EKG. Trops elevated but flat.  Plan:  Continuous tele monitoring  Daily weights. Strict I/O  AKI on CKD. Baseline Cr appears to be around 2.  Creatinine 2.5 on admission. Suspect this is related to hypovolemia Hyperkalemia found on admission  as well however resolved with lokelma, calc gluconate and insulin/d5. 4.9 this morning. Mg 1.5 this morning. Plan: 2g mag IV. will continue to monitor with daily labs. Avoid nephrotoxic agents.   Macrocytic anemia. Likely nutrition deficiency related to her difficulty with cooking. Best practice:  CODE STATUS: DNR Diet: cardiac DVT for prophylaxis: lovenox Dispo: pending further eval and management.    Mitzi Hansen, MD INTERNAL MEDICINE RESIDENT PGY-1 PAGER #: [phone] 12/27/19  1:16 PM

## 2019-12-27 NOTE — Progress Notes (Signed)
Attempting to get a urine sample from patient changing out the [place], but she is refusing at this time.  Did not want to be cleaned and changed demanding we get out of the room.  Patient was changed as bed was wet, full linen change but did not place purewick due to patient refusal.  Daughter at bedside.  Patient is currently sleeping. Patient is tired and will attempt to get a purewick in place when she wakes up.

## 2019-12-27 NOTE — Evaluation (Signed)
Clinical/Bedside Swallow Evaluation Patient Details  Name: Ann Sellers MRN: [id number] Date of Birth: 04/02/1925  Today's Date: 12/27/2019 Time: SLP Start Time (ACUTE ONLY): 1700 SLP Stop Time (ACUTE ONLY): 1721 SLP Time Calculation (min) (ACUTE ONLY): 21 min  Past Medical History:  Past Medical History:  Diagnosis Date  . Anemia   . Arthritis   . Asthma   . B12 deficiency   . Chronic diastolic CHF (congestive heart failure) ([place])   . CKD (chronic kidney disease), stage III   . Diabetes mellitus ([place])   . Hypertension   . Moderate aortic stenosis   . Neuromuscular disorder ([place])   . PAF (paroxysmal atrial fibrillation) ([place])   . Right rotator cuff tear   . Wears dentures    top denture-bottom partial  . Wears glasses   . Wears hearing aid    both ears   Past Surgical History:  Past Surgical History:  Procedure Laterality Date  . APPENDECTOMY  1945  . CATARACT EXTRACTION, BILATERAL  2008  . cervical stynosis  2005  . esophageal stretch  2001  . [place]  . herniated disc  1983  . left hand surgery  2011   gout  . left knee replacement  2002  . left knee surgery  1998  . right wrist fracture  2006  . SHOULDER ARTHROSCOPY WITH ROTATOR CUFF REPAIR Right 01/05/2013   Procedure: SHOULDER ARTHROSCOPY WITH ROTATOR CUFF REPAIR subchromial decompression and distal clavical excision;  Surgeon: Lorn Junes, MD;  Location: [facility];  Service: Orthopedics;  Laterality: Right;  . tonsillectomy  1936  . TONSILLECTOMY    . TYMPANOSTOMY TUBE PLACEMENT  1989  . VESICOVAGINAL FISTULA CLOSURE W/ TAH  1968   HPI:  Pt is a 86 year old woman admitted on 12/26/18 with progressive SOB secondary to PNA. PMH: CHF, anemia, asthma, B12 deficiency, CKD IV, DM, HTN, moderate aortic stenosis, afib, R rotator cuff tear, HOH, vision impairment.  Choking/coughing incident today noted by nursing ten minutes after finishing lunch, briefly desaturating then improving.      Assessment / Plan / Recommendation Clinical Impression  Pt quite confused upon arrival, able to participate in limited assessment, but with fluctuating attention, asking to go home.  Daughter present and supportive.  Pt with adequate oral mechanism exam; upper and lower dental plates in place.  Demonstrated intermittent ability to manipulate POs, requiring cues to attend to approaching spoon/straw (due in part to visual deficits and compounded by acute confusion), but swallowing with no s/s of aspiration.  Using accessory muscles to breathe, expiratory effort noted.  Recommend allowing a dysphagia 1 diet with thin liquids for now; crush meds in applesauce.  Pt will require 1:1 assistance with feeding.  Swallowing safety should improve as mental status does.  SLP will follow briefly for safety/diet progression.  D/W RN, daughter.  SLP Visit Diagnosis: Dysphagia, unspecified (R13.10)    Aspiration Risk  Mild aspiration risk    Diet Recommendation   dysphagia 1, thin liquids  Medication Administration: Crushed with puree    Other  Recommendations Oral Care Recommendations: Oral care BID   Follow up Recommendations Other (comment)(tba)      Frequency and Duration min 2x/week  2 weeks       Prognosis Prognosis for Safe Diet Advancement: Good Barriers to Reach Goals: Cognitive deficits      Swallow Study   General Date of Onset: 12/26/19 HPI: Pt is a 86 year old woman  admitted on 12/26/18 with progressive SOB secondary to PNA. PMH: CHF, anemia, asthma, B12 deficiency, CKD IV, DM, HTN, moderate aortic stenosis, afib, R rotator cuff tear, HOH, vision impairment.  Choking/coughing incident today noted by nursing ten minutes after finishing lunch, briefly desaturating then improving.   Type of Study: Bedside Swallow Evaluation Previous Swallow Assessment: five years ago Diet Prior to this Study: NPO Temperature Spikes Noted: No Respiratory Status: Nasal cannula History of Recent  Intubation: No Behavior/Cognition: Alert;Confused Oral Cavity Assessment: Within Functional Limits Oral Care Completed by SLP: No Oral Cavity - Dentition: Dentures, top;Dentures, bottom Vision: Impaired for self-feeding Self-Feeding Abilities: Needs assist Patient Positioning: Upright in bed Baseline Vocal Quality: Normal Volitional Cough: Weak Volitional Swallow: Able to elicit    Oral/Motor/Sensory Function Overall Oral Motor/Sensory Function: Within functional limits   Ice Chips Ice chips: Within functional limits   Thin Liquid Thin Liquid: Within functional limits    Nectar Thick Nectar Thick Liquid: Not tested   Honey Thick Honey Thick Liquid: Not tested   Puree Puree: Impaired Presentation: Spoon Oral Phase Impairments: Poor awareness of bolus Oral Phase Functional Implications: Prolonged oral transit   Solid     Solid: Not tested      Juan Quam Laurice 12/27/2019,5:30 PM Estill Bamberg L. Tivis Ringer, [place] Office number [phone] Pager [phone]

## 2019-12-27 NOTE — Consult Note (Signed)
WOC Nurse Consult Note:  Patient receiving care at [facility] 2W20, nurse at bedside  Reason for Consult:Sacral wound  Wound type:Pt. Sacrum is appropriate in color and blanchable.  Patient does have some MASD to the rectum and vaginal folds, barrier cream needed to protect from further breakdown.  Purple top barrier cream at bedside, discussed with bedside RN to continue utilizing barrier cream and foam pad to sacrum to prevent further breakdown.  Monitor the wound area's for worsening of condition such as, Signs/symptoms of infection, Increase in size, development of or worsening of odor, development of pain, or increased pain at the affected locations.  Notify the medical team if any of these develop.  Thank you for the consult.  Discussed plan of care with the patient and bedside nurse.  [place] nurse will not follow at this time.  Please re-consult the [place] if needed.    Austin Miles, RN, [employer]

## 2019-12-28 DIAGNOSIS — L8990 Pressure ulcer of unspecified site, unspecified stage: Secondary | ICD-10-CM | POA: Diagnosis present

## 2019-12-28 DIAGNOSIS — N184 Chronic kidney disease, stage 4 (severe): Secondary | ICD-10-CM

## 2019-12-28 DIAGNOSIS — E872 Acidosis: Secondary | ICD-10-CM

## 2019-12-28 DIAGNOSIS — R1310 Dysphagia, unspecified: Secondary | ICD-10-CM

## 2019-12-28 LAB — BASIC METABOLIC PANEL
Anion gap: 16 — ABNORMAL HIGH (ref 5–15)
Anion gap: 13 (ref 5–15)
Anion gap: 10 (ref 5–15)
BUN: 57 mg/dL — ABNORMAL HIGH (ref 8–23)
BUN: 56 mg/dL — ABNORMAL HIGH (ref 8–23)
BUN: 58 mg/dL — ABNORMAL HIGH (ref 8–23)
CO2: 17 mmol/L — ABNORMAL LOW (ref 22–32)
CO2: 23 mmol/L (ref 22–32)
CO2: 22 mmol/L (ref 22–32)
Calcium: 8.3 mg/dL — ABNORMAL LOW (ref 8.9–10.3)
Calcium: 8.7 mg/dL — ABNORMAL LOW (ref 8.9–10.3)
Calcium: 8.2 mg/dL — ABNORMAL LOW (ref 8.9–10.3)
Chloride: 108 mmol/L (ref 98–111)
Chloride: 106 mmol/L (ref 98–111)
Chloride: 108 mmol/L (ref 98–111)
Creatinine, Ser: 2.87 mg/dL — ABNORMAL HIGH (ref 0.44–1.00)
Creatinine, Ser: 2.85 mg/dL — ABNORMAL HIGH (ref 0.44–1.00)
Creatinine, Ser: 2.86 mg/dL — ABNORMAL HIGH (ref 0.44–1.00)
GFR calc Af Amer: 16 mL/min — ABNORMAL LOW (ref >60)
GFR calc Af Amer: 16 mL/min — ABNORMAL LOW (ref >60)
GFR calc Af Amer: 16 mL/min — ABNORMAL LOW (ref >60)
GFR calc non Af Amer: 14 mL/min — ABNORMAL LOW (ref >60)
GFR calc non Af Amer: 14 mL/min — ABNORMAL LOW (ref >60)
GFR calc non Af Amer: 14 mL/min — ABNORMAL LOW (ref >60)
Glucose, Bld: 114 mg/dL — ABNORMAL HIGH (ref 70–99)
Glucose, Bld: 115 mg/dL — ABNORMAL HIGH (ref 70–99)
Glucose, Bld: 133 mg/dL — ABNORMAL HIGH (ref 70–99)
Potassium: 5.5 mmol/L — ABNORMAL HIGH (ref 3.5–5.1)
Potassium: 5.4 mmol/L — ABNORMAL HIGH (ref 3.5–5.1)
Potassium: 5.3 mmol/L — ABNORMAL HIGH (ref 3.5–5.1)
Sodium: 141 mmol/L (ref 135–145)
Sodium: 142 mmol/L (ref 135–145)
Sodium: 140 mmol/L (ref 135–145)

## 2019-12-28 LAB — PROTEIN / CREATININE RATIO, URINE
Creatinine, Urine: 184.25 mg/dL
Protein Creatinine Ratio: 0.16 mg/mg{Cre} — ABNORMAL HIGH (ref 0.00–0.15)
Total Protein, Urine: 30 mg/dL

## 2019-12-28 LAB — CBC
HCT: 30.3 % — ABNORMAL LOW (ref 36.0–46.0)
Hemoglobin: 9.2 g/dL — ABNORMAL LOW (ref 12.0–15.0)
MCH: 30.7 pg (ref 26.0–34.0)
MCHC: 30.4 g/dL (ref 30.0–36.0)
MCV: 101 fL — ABNORMAL HIGH (ref 80.0–100.0)
Platelets: 123 10*3/uL — ABNORMAL LOW (ref 150–400)
RBC: 3 MIL/uL — ABNORMAL LOW (ref 3.87–5.11)
RDW: 14.9 % (ref 11.5–15.5)
WBC: 8.3 10*3/uL (ref 4.0–10.5)
nRBC: 0 % (ref 0.0–0.2)

## 2019-12-28 LAB — URINALYSIS, ROUTINE W REFLEX MICROSCOPIC
Bilirubin Urine: NEGATIVE
Glucose, UA: NEGATIVE mg/dL
Hgb urine dipstick: NEGATIVE
Ketones, ur: NEGATIVE mg/dL
Nitrite: NEGATIVE
Protein, ur: 30 mg/dL — AB
Specific Gravity, Urine: 1.021 (ref 1.005–1.030)
pH: 5 (ref 5.0–8.0)

## 2019-12-28 LAB — GLUCOSE, CAPILLARY
Glucose-Capillary: 92 mg/dL (ref 70–99)
Glucose-Capillary: 110 mg/dL — ABNORMAL HIGH (ref 70–99)
Glucose-Capillary: 124 mg/dL — ABNORMAL HIGH (ref 70–99)
Glucose-Capillary: 120 mg/dL — ABNORMAL HIGH (ref 70–99)

## 2019-12-28 LAB — VITAMIN B12: Vitamin B-12: 4252 pg/mL — ABNORMAL HIGH (ref 180–914)

## 2019-12-28 LAB — SODIUM, URINE, RANDOM: Sodium, Ur: 48 mmol/L

## 2019-12-28 MED ORDER — PANTOPRAZOLE SODIUM 40 MG PO PACK
40.0000 mg | PACK | Freq: Every day | ORAL | Status: DC
  Administered 2019-12-28 – 2020-01-04 (×8): 40 mg via ORAL
  Filled 2019-12-28 (×9): qty 20

## 2019-12-28 MED ORDER — GABAPENTIN 600 MG PO TABS
300.0000 mg | ORAL_TABLET | Freq: Every day | ORAL | Status: DC
  Administered 2019-12-29 – 2019-12-31 (×2): 300 mg via ORAL
  Filled 2019-12-28 (×2): qty 1

## 2019-12-28 MED ORDER — DEXTROSE 50 % IV SOLN
1.0000 | Freq: Once | INTRAVENOUS | Status: DC
  Filled 2019-12-28 (×3): qty 50

## 2019-12-28 MED ORDER — INSULIN ASPART 100 UNIT/ML IV SOLN: 10.0000 [IU] | Freq: Once | INTRAVENOUS | Status: DC

## 2019-12-28 MED ORDER — METOPROLOL TARTRATE 25 MG PO TABS
25.0000 mg | ORAL_TABLET | Freq: Two times a day (BID) | ORAL | Status: DC
  Administered 2019-12-28 – 2019-12-29 (×4): 25 mg via ORAL
  Filled 2019-12-28 (×4): qty 1

## 2019-12-28 MED ORDER — CALCITRIOL 1 MCG/ML PO SOLN
0.2500 ug | Freq: Every day | ORAL | Status: DC
  Administered 2019-12-28 – 2020-01-04 (×8): 0.25 ug via ORAL
  Filled 2019-12-28 (×8): qty 0.25

## 2019-12-28 MED ORDER — CALCIUM ACETATE (PHOS BINDER) 667 MG/5ML PO SOLN
667.0000 mg | Freq: Three times a day (TID) | ORAL | Status: DC
  Administered 2019-12-28 – 2020-01-02 (×10): 667 mg via ORAL
  Filled 2019-12-28 (×19): qty 5

## 2019-12-28 MED ORDER — SODIUM ZIRCONIUM CYCLOSILICATE 10 G PO PACK
10.0000 g | PACK | Freq: Once | ORAL | Status: AC
  Administered 2019-12-28: 10 g via ORAL
  Filled 2019-12-28: qty 1

## 2019-12-28 MED ORDER — SODIUM BICARBONATE 650 MG PO TABS
650.0000 mg | ORAL_TABLET | Freq: Two times a day (BID) | ORAL | Status: DC
  Administered 2019-12-28 – 2020-01-04 (×14): 650 mg via ORAL
  Filled 2019-12-28 (×15): qty 1

## 2019-12-28 MED ORDER — ASPIRIN 81 MG PO CHEW
81.0000 mg | CHEWABLE_TABLET | Freq: Every day | ORAL | Status: DC
  Administered 2019-12-28 – 2020-01-04 (×8): 81 mg via ORAL
  Filled 2019-12-28 (×10): qty 1

## 2019-12-28 MED ORDER — FUROSEMIDE 40 MG PO TABS
40.0000 mg | ORAL_TABLET | Freq: Once | ORAL | Status: AC
  Administered 2019-12-28: 40 mg via ORAL
  Filled 2019-12-28: qty 1

## 2019-12-28 NOTE — Progress Notes (Signed)
  Speech Language Pathology Treatment: Dysphagia  Patient Details Name: Ann Sellers MRN: [id number] DOB: [birth year] Today's Date: 12/28/2019 Time: 8756-4332 SLP Time Calculation (min) (ACUTE ONLY): 12 min  Assessment / Plan / Recommendation Clinical Impression  Pt was seen for skilled ST targeting dysphagia and diet tolerance.  She was encountered asleep in bed and she roused to moderate verbal stimulation.  RN reported that pt was tolerating her current diet well.  Pt was confused throughout this session, but she was agreeable to minimal trials of thin liquid, puree, and ground solids.  Pt appeared to have an increased respiratory rate following serial sips of thin liquid.  Pt was unable to follow verbal cues to limit bolus size; therefore the straw was externally manipulated to limit bolus size and to slow rate of intake.  Respiratory rate appeared to be steady with single sips of thin liquid.  Mastication was prolonged with ground solids and trace oral residue was observed on the pt's posterior lingual surface.  A prolonged cough was observed >1 minute following PO trials after pt's HOB was lowered.  No additional overt s/sx of aspiration were observed with any trials.  Oral care was completed following PO trials and pt tolerated it without difficulty.  Recommend continuation of Dysphagia 1 (puree) solids and thin liquids with medications crushed in puree and full supervision to assist with self feeding/cue for compensatory strategies.  SLP will continue to f/u to monitor diet tolerance and to determine readiness for clinical diet upgrade.     HPI HPI: Pt is a 86 year old woman admitted on 12/26/18 with progressive SOB secondary to PNA. PMH: CHF, anemia, asthma, B12 deficiency, CKD IV, DM, HTN, moderate aortic stenosis, afib, R rotator cuff tear, HOH, vision impairment.  Choking/coughing incident today noted by nursing ten minutes after finishing lunch, briefly desaturating then improving.        SLP  Plan  Continue with current plan of care       Recommendations  Diet recommendations: Dysphagia 1 (puree);Thin liquid Liquids provided via: Cup;Straw Medication Administration: Crushed with puree Supervision: Staff to assist with self feeding;Full supervision/cueing for compensatory strategies;Trained caregiver to feed patient Compensations: Minimize environmental distractions;Slow rate;Small sips/bites Postural Changes and/or Swallow Maneuvers: Seated upright 90 degrees                Oral Care Recommendations: Oral care BID Follow up Recommendations: Other (comment)(TBD) SLP Visit Diagnosis: Dysphagia, unspecified (R13.10) Plan: Continue with current plan of care       Media., M.S., [place] Office: [phone]  [place] 12/28/2019, 4:07 PM

## 2019-12-28 NOTE — Plan of Care (Signed)

## 2019-12-28 NOTE — TOC Initial Note (Signed)
Transition of Care [facility]) - Initial/Assessment Note    Patient Details  Name: Ann Sellers MRN: [id number] Date of Birth: November 06, 1924  [facility] of [facility] CM/SW Contact:    Blima Ledger Phone Number: [phone] 12/28/2019, 2:07 PM  Clinical Narrative:                  Pt is a 86 year old woman admitted on 12/26/18 with progressive SOB secondary to PNA. PMH: CHF, anemia, asthma, B12 deficiency, CKD IV, DM, HTN, moderate aortic stenosis, afib, R rotator cuff tear, HOH, vision impairment.  PTA patient was living at home with son. PT was able to complete ADL's; Pt was able to walk short distances and used a rollater. PT/ OT recommend SNF placement. CSW met with daughter bedside. Daughter wants to talk to the rest of the siblings before making a decision. CSW will follow up with family.  Expected Discharge Plan: Skilled Nursing Facility Barriers to Discharge: Continued Medical Work up   Patient Goals and CMS Choice        Expected Discharge Plan and Services Expected Discharge Plan: [place] arrangements for the past 2 months: Single Family Home                                      Prior Living Arrangements/Services Living arrangements for the past 2 months: Single Family Home Lives with:: Adult Children Patient language and need for interpreter reviewed:: Yes Do you feel safe going back to the place where you live?: Yes      Need for Family Participation in Patient Care: Yes (Comment) Care giver support system in place?: Yes (comment)   Criminal Activity/Legal Involvement Pertinent to Current Situation/Hospitalization: No - Comment as needed  Activities of Daily Living Home Assistive Devices/Equipment: Walker (specify type) ADL Screening (condition at time of admission) Patient's cognitive ability adequate to safely complete daily activities?: Yes Is the patient deaf or have difficulty hearing?: Yes Does the patient have  difficulty seeing, even when wearing glasses/contacts?: Yes Does the patient have difficulty concentrating, remembering, or making decisions?: No Patient able to express need for assistance with ADLs?: Yes Does the patient have difficulty dressing or bathing?: No Independently performs ADLs?: Yes (appropriate for developmental age) Does the patient have difficulty walking or climbing stairs?: Yes Weakness of Legs: Both Weakness of Arms/Hands: Both  Permission Sought/Granted Permission sought to share information with : Family Supports                Emotional Assessment Appearance:: Appears stated age Attitude/Demeanor/Rapport: Engaged Affect (typically observed): Appropriate Orientation: : Oriented to Self, Oriented to Place, Oriented to  Time, Oriented to Situation      Admission diagnosis:  Pneumonia [J18.9] AKI (acute kidney injury) ([place]) [N17.9] Aspiration pneumonia, unspecified aspiration pneumonia type, unspecified laterality, unspecified part of lung ([place]) [J69.0] Community acquired pneumonia, unspecified laterality [J18.9] Sepsis, due to unspecified organism, unspecified whether acute organ dysfunction present [facility]) [A41.9] Patient Active Problem List   Diagnosis Date Noted  . Pressure injury of skin 12/28/2019  . Pneumonia 12/26/2019  . PAF (paroxysmal atrial fibrillation) ([place]) 12/23/2016  . Aortic stenosis, moderate 09/16/2016  . Community acquired pneumonia 09/13/2016  . Lower extremity weakness 09/13/2016  . Acute on chronic diastolic CHF (congestive heart failure) ([place]) 09/13/2016  . Severe persistent chronic asthma without complication 40/07/2724  . Morbid  obesity ([place]) 07/06/2015  . Edema 07/22/2014  . Varicose veins of lower extremities with complications 12/87/8676  . CKD (chronic kidney disease), stage III 02/01/2014  . Right rotator cuff tear   . Hypertension   . Arthritis   . Insulin dependent diabetes mellitus with complications   . Spinal  stenosis   . Wears glasses   . Wears dentures   . Wears hearing aid   . Asthma 07/11/2011  . Vitamin D deficiency 07/11/2011  . Osteoarthritis 07/11/2011  . Cough 07/11/2011   PCP:  Reynold Bowen, MD Pharmacy:   [facility] [address], [address] [place] [address] Phone: [phone] Fax: [phone]  [facility] [address] [place], [place] - [address] [place] [address] [place] [address] Phone: [phone] Fax: [place], [place] - [address] [place] [address] Phone: [phone] Fax: [phone]     Social Determinants of Health (SDOH) Interventions    Readmission Risk Interventions No flowsheet data found.  Emeterio Reeve, Latanya Presser, [place] Licensed Holiday representative

## 2019-12-28 NOTE — Progress Notes (Signed)
NAME:  Ann Sellers, MRN:  [id number], DOB:  1925-06-14, LOS: 2 ADMISSION DATE:  12/26/2019  Subjective/Interm history  VSS.  Overall appearing better this morning and is conversational despite being hard of hearing.  Objective   Blood pressure (!) 149/73, pulse 81, temperature 98.6 F (37 C), temperature source Axillary, resp. rate 18, height 5' (1.524 m), weight 85.9 kg, SpO2 98 %.     Intake/Output Summary (Last 24 hours) at 12/28/2019 0521 Last data filed at 12/28/2019 0102 Gross per 24 hour  Intake 440 ml  Output 250 ml  Net 190 ml   Filed Weights   12/26/19 1000 12/28/19 0500  Weight: 81.1 kg 85.9 kg    Examination: GENERAL: in no acute distress CARDIAC: heart RRR. Systolic murmur present. PULMONARY: [place]. Bilateral crackles improved NEURO: alert and oriented. Skin: dressing tact to RLE for skin tear.  Significant Diagnostic Tests:  3/7 CXR: retrocardiac opacity 3/7 CT a/p: LLL airspace opacity. RLL nodular density--infectious vs inflammatory--repeat noncontrast CT in 90mo. 4.3cm low attenuation lesion in right adnexal region, presumably ovarian cyst. furthe revaluation with nonemergent pelvic ultrasound recommended to r/o neoplasm.   Micro Data:  3/7 blood culture>NGTD  Antimicrobials:  Azithromycin 3/7>>3/8 Rocephin 3/7>>3/8 Unasyn 3/8>>  Labs    CBC Latest Ref Rng & Units 12/28/2019 12/27/2019 12/26/2019  WBC 4.0 - 10.5 K/uL 8.3 8.4 10.5  Hemoglobin 12.0 - 15.0 g/dL 9.2(L) 10.8(L) 11.2(L)  Hematocrit 36.0 - 46.0 % 30.3(L) 36.7 37.1  Platelets 150 - 400 K/uL 123(L) 147(L) 196   BMP Latest Ref Rng & Units 12/28/2019 12/27/2019 12/26/2019  Glucose 70 - 99 mg/dL 114(H) 69(L) 50(L)  BUN 8 - 23 mg/dL 57(H) 53(H) 53(H)  Creatinine 0.44 - 1.00 mg/dL 2.87(H) 2.44(H) 2.43(H)  BUN/Creat Ratio 12 - 28 - - -  Sodium 135 - 145 mmol/L 141 145 144  Potassium 3.5 - 5.1 mmol/L 5.5(H) 4.9 4.8  Chloride 98 - 111 mmol/L 108 109 110  CO2 22 - 32 mmol/L 17(L) 23 24  Calcium 8.9 - 10.3  mg/dL 8.3(L) 8.2(L) 7.9(L)    Summary  86 yo female presenting with 1d history of shortness of breath and fever who was found to have aspiration pneumonia.   Assessment & Plan:  Active Problems:   Pneumonia  Aspiration pneumonia Afebrile overnight. Remains on 3L supplemental O2. No home O2 requirement. No leukocytosis.  Blood cultures NTGD Assessed by ST: eval limited by patient's confusion--recommending dysphagia 1 for now Overall appears better this morning.  Plan Continue unasyn--day #3/7-10 Wean supplemental O2 as able  T2DM. CBGs 50s-60s overnight. Plan: d/c SSI. Will continue to monitor  Paroxysmal atrial fibrillation not on anticoagulation due to history of bleeding. Follows with Dr. Meda Coffee in cardiology. In SR on admission.  Chronic HFpEF with severe AS. Will need to tread carefully with IVF however still appears dry this morning. Plan:  Continuous tele monitoring  Daily weights. Strict I/O  AKI on CKD IV. Baseline Cr appears to be around 2.  Trending up since admission. RTA-4 vs progression of CKD Hyperkalemia present on admission requiring lokelma, d5/insulin, calc gluc. Continues to be an issue--K 5.5 this morning. AGMA. Bicarb down trending since admission Plan: nephrology consulted. Will continue to closely monitor renal function/K with daily labs and treat accordingly. Avoid nephrotoxic agents.   Acute on chronic macrocytic anemia.  Likely nutrition deficiency related to her difficulty with cooking. Trending down since admission which I suspect is dilutional. No significant bleeding noted Best practice:  CODE STATUS:  DNR Diet: cardiac; dysphagia 1 DVT for prophylaxis: lovenox Dispo: pending further eval and management; PT/OT recommending SNF at this time--expecting this will improve as patient's PNA improves. Will need follow up for lung nodules and adnexal findings on CT after discharge.   Mitzi Hansen, MD INTERNAL MEDICINE RESIDENT PGY-1 PAGER #:  [phone] 12/28/19  5:21 AM

## 2019-12-28 NOTE — Consult Note (Signed)
[facility] Renal Consultation Note  Requesting MD: Heber  Indication for Consultation: CKD-  worsening  HPI:  Ann Sellers is a 86 y.o. female with hypertension, seems to be poorly controlled, DM, aortic stenosis with diastolic dysfunction and A. Fib-not on anticoagulation secondary to risk of bleeding.  She also seems to have CKD with creatinine in the high ones to low twos chronically.  She presented to the hospital on 3/7 with complaints of shortness of breath.  Felt to have aspiration versus community-acquired pneumonia, placed on antibiotics.  On presentation creatinine was 2.55.  It has remained essentially stable, may be a little bit worse up to 2.8 today.  Also having some hyperkalemia in the low fives.  This is the reason for consult.  Patient is currently confused, family is at bedside.  They see normally she is mentally sharp, doing housework at home.  They do feel that she is better today than yesterday.  UA looks bland, 30 of protein and no cells  Creatinine, Ser  Date/Time Value Ref Range Status  12/28/2019 08:13 AM 2.85 (H) 0.44 - 1.00 mg/dL Final  12/28/2019 01:58 AM 2.87 (H) 0.44 - 1.00 mg/dL Final  12/27/2019 03:27 AM 2.44 (H) 0.44 - 1.00 mg/dL Final  12/26/2019 10:30 PM 2.43 (H) 0.44 - 1.00 mg/dL Final  12/26/2019 04:15 PM 2.48 (H) 0.44 - 1.00 mg/dL Final  12/26/2019 09:13 AM 2.55 (H) 0.44 - 1.00 mg/dL Final  12/15/2019 11:17 AM 1.98 (H) 0.57 - 1.00 mg/dL Final  11/23/2019 12:51 PM 1.91 (H) 0.57 - 1.00 mg/dL Final  11/17/2019 09:18 AM 2.28 (H) 0.57 - 1.00 mg/dL Final  08/16/2019 11:28 AM 1.87 (H) 0.57 - 1.00 mg/dL Final  11/05/2017 10:57 AM 1.94 (H) 0.57 - 1.00 mg/dL Final  08/04/2017 10:04 AM 1.93 (H) 0.57 - 1.00 mg/dL Final  09/20/2016 08:23 PM 2.01 (H) 0.44 - 1.00 mg/dL Final  09/17/2016 06:01 AM 2.19 (H) 0.44 - 1.00 mg/dL Final  09/16/2016 06:10 AM 2.40 (H) 0.44 - 1.00 mg/dL Final  09/15/2016 06:31 AM 2.23 (H) 0.44 - 1.00 mg/dL Final  09/13/2016  05:03 AM 1.69 (H) 0.44 - 1.00 mg/dL Final  09/12/2016 11:10 PM 1.60 (H) 0.44 - 1.00 mg/dL Final  09/12/2016 11:02 PM 1.67 (H) 0.44 - 1.00 mg/dL Final  02/01/2014 02:51 PM 2.80 (H) 0.50 - 1.10 mg/dL Final  02/01/2014 02:28 PM 2.56 (H) 0.50 - 1.10 mg/dL Final  01/05/2013 12:12 PM 1.60 (H) 0.50 - 1.10 mg/dL Final     PMHx:   Past Medical History:  Diagnosis Date  . Anemia   . Arthritis   . Asthma   . B12 deficiency   . Chronic diastolic CHF (congestive heart failure) ([place])   . CKD (chronic kidney disease), stage III   . Diabetes mellitus ([place])   . Hypertension   . Moderate aortic stenosis   . Neuromuscular disorder ([place])   . PAF (paroxysmal atrial fibrillation) ([place])   . Right rotator cuff tear   . Wears dentures    top denture-bottom partial  . Wears glasses   . Wears hearing aid    both ears    Past Surgical History:  Procedure Laterality Date  . APPENDECTOMY  1945  . CATARACT EXTRACTION, BILATERAL  2008  . cervical stynosis  2005  . esophageal stretch  2001  . [place]  . herniated disc  1983  . left hand surgery  2011   gout  . left knee replacement  2002  .  left knee surgery  1998  . right wrist fracture  2006  . SHOULDER ARTHROSCOPY WITH ROTATOR CUFF REPAIR Right 01/05/2013   Procedure: SHOULDER ARTHROSCOPY WITH ROTATOR CUFF REPAIR subchromial decompression and distal clavical excision;  Surgeon: Lorn Junes, MD;  Location: [facility];  Service: Orthopedics;  Laterality: Right;  . tonsillectomy  1936  . TONSILLECTOMY    . TYMPANOSTOMY TUBE PLACEMENT  1989  . VESICOVAGINAL FISTULA CLOSURE W/ TAH  1968    Family Hx:  Family History  Problem Relation Age of Onset  . Pneumonia Mother   . Diabetes Son   . Hypertension Son   . Heart disease Sister   . Hypertension Sister   . Diabetes Sister   . Hypertension Sister   . Diabetes Daughter     Social History:  reports that she quit smoking about 32 years ago. Her smoking  use included cigarettes. She has a 10.00 pack-year smoking history. She has never used smokeless tobacco. She reports current alcohol use of about 7.0 standard drinks of alcohol per week. She reports that she does not use drugs.  Allergies:  Allergies  Allergen Reactions  . Losartan Cough  . Snake Antivenin [Antivenin Crotalidae Polyvalent (Snake Antivenin)] Other (See Comments)    PARALYSIS    Medications: Prior to Admission medications   Medication Sig Start Date End Date Taking? Authorizing Provider  albuterol (PROVENTIL HFA;VENTOLIN HFA) 108 (90 BASE) MCG/ACT inhaler Inhale 2 puffs into the lungs every 6 (six) hours as needed for wheezing or shortness of breath.    Yes [provider]  albuterol (PROVENTIL) (2.5 MG/3ML) 0.083% nebulizer solution Take 2.5 mg by nebulization every 6 (six) hours as needed for wheezing or shortness of breath.   Yes [provider]  allopurinol (ZYLOPRIM) 100 MG tablet Take 100 mg by mouth daily.     Yes [provider]  calcitRIOL (ROCALTROL) 0.25 MCG capsule Take 1 capsule by mouth daily. 06/28/15  Yes [provider]  calcium acetate (PHOSLO) 667 MG capsule Take 1 capsule (667 mg total) by mouth 3 (three) times daily with meals. 11/17/19  Yes Dorothy Spark, MD  diclofenac sodium (VOLTAREN) 1 % GEL Apply 2 g topically daily as needed (pain).   Yes [provider]  diphenoxylate-atropine (LOMOTIL) 2.5-0.025 MG per tablet Take 1 tablet by mouth daily as needed for diarrhea or loose stools.   Yes [provider]  fexofenadine (ALLEGRA) 180 MG tablet Take 1 tablet (180 mg total) by mouth daily. 04/25/16  Yes Tanda Rockers, MD  fluticasone (FLONASE) 50 MCG/ACT nasal spray Place 2 sprays into both nostrils daily. 07/08/17  Yes [provider]  furosemide (LASIX) 40 MG tablet TAKE 1 TABLET BY MOUTH EVERY DAY Patient taking differently: Take 40 mg by mouth daily. TAKE 1 TABLET BY MOUTH EVERY DAY 04/08/19   Yes Dorothy Spark, MD  gabapentin (NEURONTIN) 300 MG capsule Take 300 mg by mouth 3 (three) times daily. 04/01/17  Yes [provider]  GNP ASPIRIN LOW DOSE 81 MG EC tablet Take 81 mg by mouth daily. 07/15/17  Yes [provider]  HYDROcodone-acetaminophen (NORCO/VICODIN) 5-325 MG per tablet Take 0.5-1 tablets by mouth every 6 (six) hours as needed for moderate pain.  06/17/15  Yes [provider]  insulin glargine (LANTUS) 100 UNIT/ML injection Inject 20 Units into the skin at bedtime.    Yes [provider]  iron polysaccharides (NIFEREX) 150 MG capsule Take 150 mg by mouth  daily.   Yes [provider]  JANUVIA 50 MG tablet Take 50 mg by mouth daily. 07/11/17  Yes [provider]  metoprolol succinate (TOPROL-XL) 50 MG 24 hr tablet TAKE 1 TABLET BY MOUTH EVERY DAY WITH FOOD OR IMMEDIATELY AFTER EATING Patient taking differently: Take 50 mg by mouth daily.  11/22/19  Yes Dorothy Spark, MD  mometasone-formoterol [facility]) 100-5 MCG/ACT AERO Take 2 puffs first thing in am and then another 2 puffs about 12 hours later. 08/22/15  Yes Tanda Rockers, MD  montelukast (SINGULAIR) 10 MG tablet Take 10 mg by mouth at bedtime.   Yes [provider]  omeprazole (PRILOSEC) 20 MG capsule Take 20 mg by mouth at bedtime.  11/11/17  Yes [provider]  omeprazole (PRILOSEC) 40 MG capsule Take 30-60 min before first meal of the day 11/23/15  Yes Tanda Rockers, MD  tiZANidine (ZANAFLEX) 4 MG tablet Take 4 mg by mouth every 8 (eight) hours as needed for muscle spasms.    Yes [provider]  Vitamin D, Ergocalciferol, (DRISDOL) 50000 units CAPS capsule Take 50,000 Units by mouth once a week. 09/20/16  Yes [provider]    I have reviewed the patient's current medications.  Labs:  Results for orders placed or performed during the hospital encounter of 12/26/19 (from the past 48 hour(s))  Hemoglobin A1c     Status: Abnormal    Collection Time: 12/26/19  4:15 PM  Result Value Ref Range   Hgb A1c MFr Bld 6.1 (H) 4.8 - 5.6 %    Comment: (NOTE) Pre diabetes:          5.7%-6.4% Diabetes:              >6.4% Glycemic control for   <7.0% adults with diabetes    Mean Plasma Glucose 128.37 mg/dL    Comment: Performed at [facility] [address], [place], [place] [address]  Basic metabolic panel     Status: Abnormal   Collection Time: 12/26/19  4:15 PM  Result Value Ref Range   Sodium 143 135 - 145 mmol/L   Potassium 5.6 (H) 3.5 - 5.1 mmol/L   Chloride 110 98 - 111 mmol/L   CO2 25 22 - 32 mmol/L   Glucose, Bld 102 (H) 70 - 99 mg/dL    Comment: Glucose reference range applies only to samples taken after fasting for at least 8 hours.   BUN 53 (H) 8 - 23 mg/dL   Creatinine, Ser 2.48 (H) 0.44 - 1.00 mg/dL   Calcium 7.3 (L) 8.9 - 10.3 mg/dL   GFR calc non Af Amer 16 (L) >60 mL/min   GFR calc Af Amer 19 (L) >60 mL/min   Anion gap 8 5 - 15    Comment: Performed at [facility] [address] [place], [place] [address]  Glucose, capillary     Status: None   Collection Time: 12/26/19  4:41 PM  Result Value Ref Range   Glucose-Capillary 82 70 - 99 mg/dL    Comment: Glucose reference range applies only to samples taken after fasting for at least 8 hours.  Troponin I (High Sensitivity)     Status: Abnormal   Collection Time: 12/26/19  6:37 PM  Result Value Ref Range   Troponin I (High Sensitivity) 56 (H) <18 ng/L    Comment: (NOTE) Elevated high sensitivity troponin I (hsTnI) values and significant  changes across serial measurements may suggest ACS but many  other  chronic and acute conditions are known to elevate hsTnI results.  Refer to the "Links" section for chest pain algorithms and additional  guidance. Performed at [facility], [place] [address], [place], [place] [address]   Glucose, capillary     Status: Abnormal   Collection Time: 12/26/19  6:43 PM  Result Value Ref Range    Glucose-Capillary 129 (H) 70 - 99 mg/dL    Comment: Glucose reference range applies only to samples taken after fasting for at least 8 hours.  MRSA PCR Screening     Status: None   Collection Time: 12/26/19  6:57 PM   Specimen: Nasopharyngeal Swab  Result Value Ref Range   MRSA by PCR NEGATIVE NEGATIVE    Comment:        The GeneXpert MRSA Assay (FDA approved for NASAL specimens only), is one component of a comprehensive MRSA colonization surveillance program. It is not intended to diagnose MRSA infection nor to guide or monitor treatment for MRSA infections. Performed at [facility], [place] [address] [place], [place] [address]   Glucose, capillary     Status: None   Collection Time: 12/26/19  8:43 PM  Result Value Ref Range   Glucose-Capillary 80 70 - 99 mg/dL    Comment: Glucose reference range applies only to samples taken after fasting for at least 8 hours.  Basic metabolic panel     Status: Abnormal   Collection Time: 12/26/19 10:30 PM  Result Value Ref Range   Sodium 144 135 - 145 mmol/L   Potassium 4.8 3.5 - 5.1 mmol/L   Chloride 110 98 - 111 mmol/L   CO2 24 22 - 32 mmol/L   Glucose, Bld 50 (L) 70 - 99 mg/dL    Comment: Glucose reference range applies only to samples taken after fasting for at least 8 hours.   BUN 53 (H) 8 - 23 mg/dL   Creatinine, Ser 2.43 (H) 0.44 - 1.00 mg/dL   Calcium 7.9 (L) 8.9 - 10.3 mg/dL   GFR calc non Af Amer 17 (L) >60 mL/min   GFR calc Af Amer 19 (L) >60 mL/min   Anion gap 10 5 - 15    Comment: Performed at [facility] [address] [place], [place] [address]  Troponin I (High Sensitivity)     Status: Abnormal   Collection Time: 12/26/19 10:30 PM  Result Value Ref Range   Troponin I (High Sensitivity) 58 (H) <18 ng/L    Comment: (NOTE) Elevated high sensitivity troponin I (hsTnI) values and significant  changes across serial measurements may suggest ACS but many other  chronic and acute conditions are known to elevate  hsTnI results.  Refer to the "Links" section for chest pain algorithms and additional  guidance. Performed at [facility], [place] [address], [place], [place] [address]   Magnesium     Status: Abnormal   Collection Time: 12/27/19  3:27 AM  Result Value Ref Range   Magnesium 1.5 (L) 1.7 - 2.4 mg/dL    Comment: Performed at [facility] [address] [place],  [address]  CBC WITH DIFFERENTIAL     Status: Abnormal   Collection Time: 12/27/19  3:27 AM  Result Value Ref Range   WBC 8.4 4.0 - 10.5 K/uL   RBC 3.51 (L) 3.87 - 5.11 MIL/uL   Hemoglobin 10.8 (L) 12.0 - 15.0 g/dL   HCT 36.7 36.0 - 46.0 %   MCV 104.6 (  H) 80.0 - 100.0 fL   MCH 30.8 26.0 - 34.0 pg   MCHC 29.4 (L) 30.0 - 36.0 g/dL   RDW 14.9 11.5 - 15.5 %   Platelets 147 (L) 150 - 400 K/uL   nRBC 0.0 0.0 - 0.2 %   Neutrophils Relative % 80 %   Neutro Abs 6.8 1.7 - 7.7 K/uL   Lymphocytes Relative 11 %   Lymphs Abs 0.9 0.7 - 4.0 K/uL   Monocytes Relative 6 %   Monocytes Absolute 0.5 0.1 - 1.0 K/uL   Eosinophils Relative 1 %   Eosinophils Absolute 0.1 0.0 - 0.5 K/uL   Basophils Relative 1 %   Basophils Absolute 0.0 0.0 - 0.1 K/uL   Immature Granulocytes 1 %   Abs Immature Granulocytes 0.04 0.00 - 0.07 K/uL    Comment: Performed at [facility] [address], [place], [place] [address]  Comprehensive metabolic panel     Status: Abnormal   Collection Time: 12/27/19  3:27 AM  Result Value Ref Range   Sodium 145 135 - 145 mmol/L   Potassium 4.9 3.5 - 5.1 mmol/L   Chloride 109 98 - 111 mmol/L   CO2 23 22 - 32 mmol/L   Glucose, Bld 69 (L) 70 - 99 mg/dL    Comment: Glucose reference range applies only to samples taken after fasting for at least 8 hours.   BUN 53 (H) 8 - 23 mg/dL   Creatinine, Ser 2.44 (H) 0.44 - 1.00 mg/dL   Calcium 8.2 (L) 8.9 - 10.3 mg/dL   Total Protein 5.8 (L) 6.5 - 8.1 g/dL   Albumin 3.1 (L) 3.5 - 5.0 g/dL   AST 18 15 - 41 U/L   ALT 13 0 - 44 U/L   Alkaline Phosphatase 63  38 - 126 U/L   Total Bilirubin 0.8 0.3 - 1.2 mg/dL   GFR calc non Af Amer 17 (L) >60 mL/min   GFR calc Af Amer 19 (L) >60 mL/min   Anion gap 13 5 - 15    Comment: Performed at [facility] [address], [place], [place] [address]  Glucose, capillary     Status: None   Collection Time: 12/27/19  8:12 AM  Result Value Ref Range   Glucose-Capillary 73 70 - 99 mg/dL    Comment: Glucose reference range applies only to samples taken after fasting for at least 8 hours.  Glucose, capillary     Status: Abnormal   Collection Time: 12/27/19 11:14 AM  Result Value Ref Range   Glucose-Capillary 101 (H) 70 - 99 mg/dL    Comment: Glucose reference range applies only to samples taken after fasting for at least 8 hours.  Glucose, capillary     Status: None   Collection Time: 12/27/19  4:31 PM  Result Value Ref Range   Glucose-Capillary 89 70 - 99 mg/dL    Comment: Glucose reference range applies only to samples taken after fasting for at least 8 hours.  Glucose, capillary     Status: None   Collection Time: 12/27/19  8:19 PM  Result Value Ref Range   Glucose-Capillary 94 70 - 99 mg/dL    Comment: Glucose reference range applies only to samples taken after fasting for at least 8 hours.  Basic metabolic panel     Status: Abnormal   Collection Time: 12/28/19  1:58 AM  Result Value Ref Range   Sodium 141 135 - 145 mmol/L   Potassium 5.5 (H)  3.5 - 5.1 mmol/L   Chloride 108 98 - 111 mmol/L   CO2 17 (L) 22 - 32 mmol/L   Glucose, Bld 114 (H) 70 - 99 mg/dL    Comment: Glucose reference range applies only to samples taken after fasting for at least 8 hours.   BUN 57 (H) 8 - 23 mg/dL   Creatinine, Ser 2.87 (H) 0.44 - 1.00 mg/dL   Calcium 8.3 (L) 8.9 - 10.3 mg/dL   GFR calc non Af Amer 14 (L) >60 mL/min   GFR calc Af Amer 16 (L) >60 mL/min   Anion gap 16 (H) 5 - 15    Comment: Performed at [facility] [address], [place], [place] [address]  CBC     Status: Abnormal   Collection  Time: 12/28/19  1:58 AM  Result Value Ref Range   WBC 8.3 4.0 - 10.5 K/uL   RBC 3.00 (L) 3.87 - 5.11 MIL/uL   Hemoglobin 9.2 (L) 12.0 - 15.0 g/dL   HCT 30.3 (L) 36.0 - 46.0 %   MCV 101.0 (H) 80.0 - 100.0 fL   MCH 30.7 26.0 - 34.0 pg   MCHC 30.4 30.0 - 36.0 g/dL   RDW 14.9 11.5 - 15.5 %   Platelets 123 (L) 150 - 400 K/uL   nRBC 0.0 0.0 - 0.2 %    Comment: Performed at [facility], [place] [address], [place], [place] [address]  Vitamin B12     Status: Abnormal   Collection Time: 12/28/19  1:58 AM  Result Value Ref Range   Vitamin B-12 4,252 (H) 180 - 914 pg/mL    Comment: (NOTE) This assay is not validated for testing neonatal or myeloproliferative syndrome specimens for Vitamin B12 levels. Performed at [facility], [place] [address] [place], [place] [address]   Basic metabolic panel     Status: Abnormal   Collection Time: 12/28/19  8:13 AM  Result Value Ref Range   Sodium 142 135 - 145 mmol/L   Potassium 5.4 (H) 3.5 - 5.1 mmol/L   Chloride 106 98 - 111 mmol/L   CO2 23 22 - 32 mmol/L   Glucose, Bld 115 (H) 70 - 99 mg/dL    Comment: Glucose reference range applies only to samples taken after fasting for at least 8 hours.   BUN 56 (H) 8 - 23 mg/dL   Creatinine, Ser 2.85 (H) 0.44 - 1.00 mg/dL   Calcium 8.7 (L) 8.9 - 10.3 mg/dL   GFR calc non Af Amer 14 (L) >60 mL/min   GFR calc Af Amer 16 (L) >60 mL/min   Anion gap 13 5 - 15    Comment: Performed at [facility] [address], [place], [place] [address]  Glucose, capillary     Status: None   Collection Time: 12/28/19  8:36 AM  Result Value Ref Range   Glucose-Capillary 92 70 - 99 mg/dL    Comment: Glucose reference range applies only to samples taken after fasting for at least 8 hours.  Urinalysis, Routine w reflex microscopic     Status: Abnormal   Collection Time: 12/28/19  8:59 AM  Result Value Ref Range   Color, Urine YELLOW YELLOW   APPearance HAZY (A) CLEAR   Specific Gravity, Urine 1.021 1.005 -  1.030   pH 5.0 5.0 - 8.0   Glucose, UA NEGATIVE NEGATIVE mg/dL   Hgb urine dipstick NEGATIVE NEGATIVE   Bilirubin Urine NEGATIVE NEGATIVE  Ketones, ur NEGATIVE NEGATIVE mg/dL   Protein, ur 30 (A) NEGATIVE mg/dL   Nitrite NEGATIVE NEGATIVE   Leukocytes,Ua SMALL (A) NEGATIVE   RBC / HPF 0-5 0 - 5 RBC/hpf   WBC, UA 0-5 0 - 5 WBC/hpf   Bacteria, UA RARE (A) NONE SEEN   Squamous Epithelial / LPF 0-5 0 - 5   Mucus PRESENT    Ca Oxalate Crys, UA PRESENT     Comment: Performed at [facility] [address], [place], [place] [address]  Protein / creatinine ratio, urine     Status: Abnormal   Collection Time: 12/28/19  8:59 AM  Result Value Ref Range   Creatinine, Urine 184.25 mg/dL   Total Protein, Urine 30 mg/dL    Comment: NO NORMAL RANGE ESTABLISHED FOR THIS TEST   Protein Creatinine Ratio 0.16 (H) 0.00 - 0.15 mg/mg[Cre]    Comment: Performed at [facility] [address], [place], [place] [address]  Sodium, urine, random     Status: None   Collection Time: 12/28/19  8:59 AM  Result Value Ref Range   Sodium, Ur 48 mmol/L    Comment: Performed at [facility] [address], [place], [place] [address]  Glucose, capillary     Status: Abnormal   Collection Time: 12/28/19 12:15 PM  Result Value Ref Range   Glucose-Capillary 110 (H) 70 - 99 mg/dL    Comment: Glucose reference range applies only to samples taken after fasting for at least 8 hours.     ROS:  Review of systems not obtained due to patient factors. pt confused  "I guess im doin allright"   Physical Exam: Vitals:   12/28/19 1137 12/28/19 1217  BP:  (!) 180/53  Pulse:  69  Resp:  19  Temp: 98.9 F (37.2 C) 99.3 F (37.4 C)  SpO2:  95%     General: Well-nourished white female who does not appear 89.  However, she is confused, tangential conversation HEENT: Pupils are equally round and reactive to light, extraocular motions are intact, mucous membranes are moist Neck: No JVD Heart: Regular  rate and rhythm, murmur of aortic stenosis Lungs: Mostly clear Abdomen: Soft, nontender, nondistended Extremities: 0 to trace peripheral edema Skin: Warm and dry Neuro: Alert-cannot participate in pleasant simple conversation.  Otherwise with random utterances  Assessment/Plan: 86 year old white female with hypertension, diabetes, aortic stenosis and CKD.  She presents with aspiration pneumonia.  Hospitalization complicated by slight worsening in CKD with hyperkalemia 1.Renal-longstanding CKD at baseline.  Likely secondary to hypertension and diabetes as well as age-related nephrosclerosis.  Urinalysis is bland.  No particular reasoning for slight worsening just being in the hospital and having other medical condition 2.  Hyperkalemia-as a result of her decreased GFR.  Has not rechecked dangerous range.  For management I would recommend oral sodium bicarbonate, attempted control of potassium in diet and she may need some [place] maybe 3 times a week to keep in check 3. Hypertension/volume  -blood pressure is reasonable for 86 year old 4. Anemia  -likely related to CKD and hospitalization.  Supportive care for now 5.  Since on a phosphate binder will check phos in AM   Piedra Aguza 12/28/2019, 2:19 PM

## 2019-12-28 NOTE — Progress Notes (Signed)
Patient did not want to eat but one bite of dinner.  She attempted to take a sip of coffee and coughed after doing so.  Patient was checked and sacrum intact, small foam over pink area.  Peri care provided and new purewick.  Patient does have some MASD in the periarea.  Linen changed and patient resting comfortably at this time.

## 2019-12-28 NOTE — NC FL2 (Signed)
[place] LEVEL OF CARE SCREENING TOOL     IDENTIFICATION  Patient Name: Ann Sellers Birthdate: 18-Nov-1924 Sex: female Admission Date (Current Location): 12/26/2019  [facility] and [place] Number:  Herbalist and Address:  [facility], [place] [address] [place], [place] [address]      Provider Number: [id number]  Attending Physician Name and Address:  Lucious Groves, DO  Relative Name and Phone Number:       Current Level of Care: Hospital Recommended Level of Care: [place] Prior Approval Number:    Date Approved/Denied:   PASRR Number: [id number]  Discharge Plan: SNF    Current Diagnoses: Patient Active Problem List   Diagnosis Date Noted  . Pressure injury of skin 12/28/2019  . Pneumonia 12/26/2019  . PAF (paroxysmal atrial fibrillation) ([place]) 12/23/2016  . Aortic stenosis, moderate 09/16/2016  . Community acquired pneumonia 09/13/2016  . Lower extremity weakness 09/13/2016  . Acute on chronic diastolic CHF (congestive heart failure) ([place]) 09/13/2016  . Severe persistent chronic asthma without complication 40/05/6760  . Morbid obesity ([place]) 07/06/2015  . Edema 07/22/2014  . Varicose veins of lower extremities with complications 95/06/3266  . CKD (chronic kidney disease), stage III 02/01/2014  . Right rotator cuff tear   . Hypertension   . Arthritis   . Insulin dependent diabetes mellitus with complications   . Spinal stenosis   . Wears glasses   . Wears dentures   . Wears hearing aid   . Asthma 07/11/2011  . Vitamin D deficiency 07/11/2011  . Osteoarthritis 07/11/2011  . Cough 07/11/2011    Orientation RESPIRATION BLADDER Height & Weight     Self, Time, Situation, Place  Normal Incontinent Weight: 189 lb 6 oz (85.9 kg) Height:  5' (152.4 cm)  BEHAVIORAL SYMPTOMS/MOOD NEUROLOGICAL BOWEL NUTRITION STATUS      Incontinent Diet(see discharge packet)  AMBULATORY STATUS COMMUNICATION OF NEEDS  Skin   Limited Assist Verbally Normal                       Personal Care Assistance Level of Assistance  Bathing, Feeding, Dressing Bathing Assistance: Limited assistance Feeding assistance: Limited assistance Dressing Assistance: Limited assistance     Functional Limitations Info  Sight Sight Info: Impaired        SPECIAL CARE FACTORS FREQUENCY  PT (By licensed PT), OT (By licensed OT)     PT Frequency: 5x a week OT Frequency: 5x a week            Contractures Contractures Info: Not present    Additional Factors Info  Code Status, Allergies Code Status Info: DNR Allergies Info: Losartan, snake antivenin           Current Medications (12/28/2019):  This is the current hospital active medication list Current Facility-Administered Medications  Medication Dose Route Frequency Provider Last Rate Last Admin  . acetaminophen (TYLENOL) tablet 650 mg  650 mg Oral Q6H PRN Seawell, Jaimie A, DO   650 mg at 12/28/19 1255   Or  . acetaminophen (TYLENOL) suppository 650 mg  650 mg Rectal Q6H PRN Seawell, Jaimie A, DO      . albuterol (PROVENTIL) (2.5 MG/3ML) 0.083% nebulizer solution 3 mL  3 mL Inhalation Q6H PRN Seawell, Jaimie A, DO   3 mL at 12/27/19 1129  . allopurinol (ZYLOPRIM) tablet 50 mg  50 mg Oral QODAY Seawell, Jaimie A, DO   50 mg at 12/27/19 1132  .  ampicillin-sulbactam (UNASYN) 1.5 g in sodium chloride 0.9 % 100 mL IVPB  1.5 g Intravenous Q12H Wynell Balloon, RPH 200 mL/hr at 12/28/19 1319 1.5 g at 12/28/19 1319  . aspirin chewable tablet 81 mg  81 mg Oral Daily Joni Reining C, DO   81 mg at 12/28/19 1302  . calcitRIOL (ROCALTROL) 1 MCG/ML solution 0.25 mcg  0.25 mcg Oral Daily Skeet Simmer, RPH      . calcium acetate (Phos Binder) [facility]) 667 MG/5ML oral solution 667 mg  667 mg Oral TID WC Skeet Simmer, RPH      . dextrose 50 % solution 50 mL  1 ampule Intravenous Once Seawell, Jaimie A, DO   Stopped at 12/28/19 0754  . diclofenac sodium  (VOLTAREN) 1 % transdermal gel 2 g  2 g Topical Daily PRN Seawell, Jaimie A, DO      . enoxaparin (LOVENOX) injection 30 mg  30 mg Subcutaneous Q24H Seawell, Jaimie A, DO   30 mg at 12/27/19 1829  . furosemide (LASIX) tablet 40 mg  40 mg Oral Once Seawell, Jaimie A, DO      . gabapentin (NEURONTIN) tablet 300 mg  300 mg Oral BID Seawell, Jaimie A, DO   300 mg at 12/28/19 1259  . loratadine (CLARITIN) tablet 10 mg  10 mg Oral Daily Seawell, Jaimie A, DO   10 mg at 12/28/19 1301  . metoprolol tartrate (LOPRESSOR) tablet 25 mg  25 mg Oral BID Seawell, Jaimie A, DO   25 mg at 12/28/19 1300  . mometasone-formoterol (DULERA) 100-5 MCG/ACT inhaler 2 puff  2 puff Inhalation BID Seawell, Jaimie A, DO   2 puff at 12/28/19 0816  . pantoprazole sodium (PROTONIX) 40 mg/20 mL oral suspension 40 mg  40 mg Oral Daily Lucious Groves, DO   40 mg at 12/28/19 1233     Discharge Medications: Please see discharge summary for a list of discharge medications.  Relevant Imaging Results:  Relevant Lab Results:   Additional Information SSN [id number]  Emeterio Reeve, [place]

## 2019-12-29 DIAGNOSIS — R410 Disorientation, unspecified: Secondary | ICD-10-CM

## 2019-12-29 LAB — CBC
HCT: 30.3 % — ABNORMAL LOW (ref 36.0–46.0)
Hemoglobin: 9.1 g/dL — ABNORMAL LOW (ref 12.0–15.0)
MCH: 30.7 pg (ref 26.0–34.0)
MCHC: 30 g/dL (ref 30.0–36.0)
MCV: 102.4 fL — ABNORMAL HIGH (ref 80.0–100.0)
Platelets: 127 10*3/uL — ABNORMAL LOW (ref 150–400)
RBC: 2.96 MIL/uL — ABNORMAL LOW (ref 3.87–5.11)
RDW: 14.6 % (ref 11.5–15.5)
WBC: 6.6 10*3/uL (ref 4.0–10.5)
nRBC: 0 % (ref 0.0–0.2)

## 2019-12-29 LAB — RENAL FUNCTION PANEL
Albumin: 3 g/dL — ABNORMAL LOW (ref 3.5–5.0)
Anion gap: 14 (ref 5–15)
BUN: 59 mg/dL — ABNORMAL HIGH (ref 8–23)
CO2: 19 mmol/L — ABNORMAL LOW (ref 22–32)
Calcium: 8.7 mg/dL — ABNORMAL LOW (ref 8.9–10.3)
Chloride: 107 mmol/L (ref 98–111)
Creatinine, Ser: 2.83 mg/dL — ABNORMAL HIGH (ref 0.44–1.00)
GFR calc Af Amer: 16 mL/min — ABNORMAL LOW (ref >60)
GFR calc non Af Amer: 14 mL/min — ABNORMAL LOW (ref >60)
Glucose, Bld: 117 mg/dL — ABNORMAL HIGH (ref 70–99)
Phosphorus: 6 mg/dL — ABNORMAL HIGH (ref 2.5–4.6)
Potassium: 5 mmol/L (ref 3.5–5.1)
Sodium: 140 mmol/L (ref 135–145)

## 2019-12-29 LAB — GLUCOSE, CAPILLARY
Glucose-Capillary: 103 mg/dL — ABNORMAL HIGH (ref 70–99)
Glucose-Capillary: 106 mg/dL — ABNORMAL HIGH (ref 70–99)
Glucose-Capillary: 102 mg/dL — ABNORMAL HIGH (ref 70–99)
Glucose-Capillary: 159 mg/dL — ABNORMAL HIGH (ref 70–99)

## 2019-12-29 LAB — URINE CULTURE: Culture: NO GROWTH

## 2019-12-29 MED ORDER — AMIODARONE HCL IN DEXTROSE 360-4.14 MG/200ML-% IV SOLN
60.0000 mg/h | INTRAVENOUS | Status: AC
  Administered 2019-12-29: 60 mg/h via INTRAVENOUS
  Filled 2019-12-29 (×2): qty 200

## 2019-12-29 MED ORDER — SODIUM ZIRCONIUM CYCLOSILICATE 5 G PO PACK
5.0000 g | PACK | ORAL | Status: DC
  Administered 2019-12-29 – 2019-12-31 (×2): 5 g via ORAL
  Filled 2019-12-29 (×2): qty 1

## 2019-12-29 MED ORDER — AMIODARONE HCL IN DEXTROSE 360-4.14 MG/200ML-% IV SOLN
30.0000 mg/h | INTRAVENOUS | Status: DC
  Filled 2019-12-29 (×3): qty 200

## 2019-12-29 MED ORDER — AMIODARONE LOAD VIA INFUSION
150.0000 mg | Freq: Once | INTRAVENOUS | Status: AC
  Administered 2019-12-29: 150 mg via INTRAVENOUS
  Filled 2019-12-29: qty 83.34

## 2019-12-29 MED ORDER — IPRATROPIUM-ALBUTEROL 0.5-2.5 (3) MG/3ML IN SOLN: 3.0000 mL | Freq: Once | RESPIRATORY_TRACT | Status: DC

## 2019-12-29 MED ORDER — METOPROLOL TARTRATE 5 MG/5ML IV SOLN
5.0000 mg | Freq: Once | INTRAVENOUS | Status: AC
  Administered 2019-12-29: 5 mg via INTRAVENOUS
  Filled 2019-12-29: qty 5

## 2019-12-29 MED ORDER — AMIODARONE IV BOLUS ONLY 150 MG/100ML
150.0000 mg | Freq: Once | INTRAVENOUS | Status: DC
  Filled 2019-12-29: qty 100

## 2019-12-29 NOTE — Progress Notes (Signed)
Pt's right arm has progressively become more reddened and bruised. It is not warm to the touch. No IV on that extremity and blood pressures currently not being taken on that arm. Will continue to monitor.

## 2019-12-29 NOTE — Progress Notes (Signed)
Physical Therapy Treatment Patient Details Name: Ann Sellers MRN: [id number] DOB: 10-Dec-1924 Today's Date: 12/29/2019    History of Present Illness Pt is a 86 year old woman admitted on 12/26/18 with progressive SOB secondary to PNA. PMH: CHF, anemia, asthma, B12 deficiency, CKD IV, DM, HTN, moderate aortic stenosis, afib, R rotator cuff tear, HOH, vision impairment.    PT Comments    Pt with increased confusion today, initially answers questions appropriately but then has difficulty with command following and short term memory. Pt also had increased weakness today, requiring modA for bed mobility and minA for sit>stand but modAx2 for transfer to chair. Unable to ambulate today due to weakness. D/c plans remain appropriate. PT will continue to follow acutely.  Follow Up Recommendations  SNF     Equipment Recommendations  Other (comment)(TBD at next venue)       Precautions / Restrictions Precautions Precautions: Fall Precaution Comments: Pt with low vision. Restrictions Weight Bearing Restrictions: No    Mobility  Bed Mobility Overal bed mobility: Needs Assistance Bed Mobility: Supine to Sit;Sidelying to Sit   Sidelying to sit: Mod assist Supine to sit: Mod assist        Transfers Overall transfer level: Needs assistance Equipment used: (Rollator) Transfers: Sit to/from [facility] Sit to Stand: +2 physical assistance;+2 safety/equipment;Min assist Stand pivot transfers: Mod assist;+2 physical assistance;+2 safety/equipment       General transfer comment: Required tactile and verbal cueing with transfer and when/how to move feet  Ambulation/Gait Ambulation/Gait assistance: Mod assist Gait Distance (Feet): 2 Feet Assistive device: 2 person hand held assist Gait Pattern/deviations: Step-through pattern;Decreased step length - right;Decreased step length - left;Shuffle;Trunk flexed Gait velocity: slowed Gait velocity interpretation: <1.8 ft/sec,  indicate of risk for recurrent falls General Gait Details: initially tried to utilize Rollator, however pt with decreased strength, pt sat down and with subsequent sit>stand able to step to recliner with 2 person modA for steadying.          Balance Overall balance assessment: Needs assistance Sitting-balance support: Bilateral upper extremity supported Sitting balance-Leahy Scale: Poor     Standing balance support: Bilateral upper extremity supported;During functional activity Standing balance-Leahy Scale: Poor                              Cognition Arousal/Alertness: Awake/alert Behavior During Therapy: WFL for tasks assessed/performed Overall Cognitive Status: Impaired/Different from baseline Area of Impairment: Memory;Following commands;Safety/judgement;Awareness;Problem solving                     Memory: Decreased short-term memory Following Commands: Follows one step commands with increased time;Follows one step commands inconsistently Safety/Judgement: Decreased awareness of safety;Decreased awareness of deficits Awareness: Emergent Problem Solving: Slow processing;Decreased initiation;Difficulty sequencing;Requires verbal cues;Requires tactile cues General Comments: Required a lot of cueing during session.  Seemed to become more confused at end of session once in chair.  She stated many times that her eyes were open though they were closed.          General Comments General comments (skin integrity, edema, etc.): Pt on 3L O2 at baseline, VSS       Pertinent Vitals/Pain Pain Assessment: Faces Faces Pain Scale: No hurt     PT Goals (current goals can now be found in the care plan section) Acute Rehab PT Goals Patient Stated Goal: to eat PT Goal Formulation: With patient Time For Goal Achievement: 01/10/20 Potential to Achieve Goals:  Fair Progress towards PT goals: Not progressing toward goals - comment(pt with decreased endurance and  decreased cognition today)    Frequency    Min 2X/week      PT Plan Current plan remains appropriate    Co-evaluation PT/OT/SLP Co-Evaluation/Treatment: Yes Reason for Co-Treatment: For patient/therapist safety PT goals addressed during session: Mobility/safety with mobility OT goals addressed during session: ADL's and self-care      AM-PAC PT "6 Clicks" Mobility   Outcome Measure  Help needed turning from your back to your side while in a flat bed without using bedrails?: A Little Help needed moving from lying on your back to sitting on the side of a flat bed without using bedrails?: Total Help needed moving to and from a bed to a chair (including a wheelchair)?: Total Help needed standing up from a chair using your arms (e.g., wheelchair or bedside chair)?: A Lot Help needed to walk in hospital room?: Total Help needed climbing 3-5 steps with a railing? : Total 6 Click Score: 9    End of Session Equipment Utilized During Treatment: Gait belt;Oxygen Activity Tolerance: Other (comment)(limited by poor vision and hearing ) Patient left: in chair;with call bell/phone within reach;with chair alarm set Nurse Communication: Mobility status;Other (comment)(O2 desaturation) PT Visit Diagnosis: Unsteadiness on feet (R26.81);Other abnormalities of gait and mobility (R26.89);Muscle weakness (generalized) (M62.81);History of falling (Z91.81);Difficulty in walking, not elsewhere classified (R26.2)     Time: 9163-8466 PT Time Calculation (min) (ACUTE ONLY): 35 min  Charges:  $Therapeutic Activity: 8-22 mins                     Callista Hoh B. Migdalia Dk PT, DPT Acute Rehabilitation Services Pager [phone] Office [phone]    [place] 12/29/2019, 1:20 PM

## 2019-12-29 NOTE — Progress Notes (Signed)
Paged for A-fib with RVR. Presented to bedside. Patient hemodynamically stable and feels her heart racing but otherwise asymptomatic. Tells me she has been told she had A-fib in the past. EKG reviewed and consistent with A-fib. Known history of PAF. Likely due to acute illness. Given onset < 48 hours will bolus with amiodarone and start drip.   Ann Homes, MD IMTS PGY3

## 2019-12-29 NOTE — Plan of Care (Signed)

## 2019-12-29 NOTE — Progress Notes (Addendum)
Paged for wheezing, confusion, and persistent tachycardia. Presented to bedside. Patient hemodynamically stable but confused. Diffuse wheezing on PE. HR remains > 140. No worsening hypoxia. Confusion likely delirium in the setting of advanced age, illness, and hospitalization. Will bolus another 150 mg Amiodarone, IV metoprolol tartrate 5 mg once, and give 1 treatment of duonebs.   Ina Homes, MD  IMTS PGY3

## 2019-12-29 NOTE — Progress Notes (Signed)
Patient now in sinus rhythm. Resting comfortably. Ordered confirmatory EKG, okay to waiting since patient is sleeping. Amiodarone drip discontinued.   Ina Homes, MD  IMTS PGY3

## 2019-12-29 NOTE — Progress Notes (Addendum)
Occupational Therapy Treatment Patient Details Name: Ann Sellers MRN: [id number] DOB: Ann 08, 1926 Today's Date: 12/29/2019    History of present illness Pt is a 86 year old woman admitted on 12/26/18 with progressive SOB secondary to PNA. PMH: CHF, anemia, asthma, B12 deficiency, CKD IV, DM, HTN, moderate aortic stenosis, afib, R rotator cuff tear, HOH, vision impairment.   OT comments  Patient was in bed on arrival and required mod assist to get to EOB.  Patient confused throughout session and required max cueing, verbal and tactile, for sequencing with mobility and ADLs.  She was min assist x2 to stand and mod x 2 to transfer.  Patient demonstrated poor safety awareness throughout.  She has significant low vision and was often reaching far out from base to attempt to grab objects/walker.  Once seated in chair patient seemed to become more confused.  Will continue to follow with OT acutely to address the deficits listed below.    Follow Up Recommendations  SNF;Supervision/Assistance - 24 hour    Equipment Recommendations  3-in-1 (for use in shower)  Recommendations for Other Services      Precautions / Restrictions Precautions Precautions: Fall Precaution Comments: Pt with low vision. Restrictions Weight Bearing Restrictions: No       Mobility Bed Mobility Overal bed mobility: Needs Assistance Bed Mobility: Supine to Sit;Sidelying to Sit   Sidelying to sit: Mod assist Supine to sit: Mod assist        Transfers Overall transfer level: Needs assistance Equipment used: (Rollator) Transfers: Sit to/from [facility] Sit to Stand: +2 physical assistance;+2 safety/equipment;Min assist Stand pivot transfers: Mod assist;+2 physical assistance;+2 safety/equipment       General transfer comment: Required tactile and verbal cueing with transfer and when/how to move feet    Balance Overall balance assessment: Needs assistance Sitting-balance support: Bilateral  upper extremity supported Sitting balance-Leahy Scale: Poor     Standing balance support: Bilateral upper extremity supported;During functional activity Standing balance-Leahy Scale: Poor                             ADL either performed or assessed with clinical judgement   ADL Overall ADL's : Needs assistance/impaired     Grooming: Wash/dry face;Wash/dry hands;Oral care;Moderate assistance;Sitting                               Functional mobility during ADLs: +2 for physical assistance;Minimal assistance;Moderate assistance(Rollator)       Vision       Perception     Praxis      Cognition Arousal/Alertness: Awake/alert Behavior During Therapy: WFL for tasks assessed/performed Overall Cognitive Status: Impaired/Different from baseline Area of Impairment: Memory;Following commands;Safety/judgement;Awareness;Problem solving                     Memory: Decreased short-term memory Following Commands: Follows one step commands with increased time;Follows one step commands inconsistently Safety/Judgement: Decreased awareness of safety;Decreased awareness of deficits Awareness: Emergent Problem Solving: Slow processing;Decreased initiation;Difficulty sequencing;Requires verbal cues;Requires tactile cues General Comments: Required a lot of cueing during session.  Seemed to become more confused at end of session once in chair.  She stated many times that her eyes were open though they were closed.         Exercises     Shoulder Instructions       General Comments  Pertinent Vitals/ Pain       Pain Assessment: Faces Faces Pain Scale: No hurt  Home Living                                          Prior Functioning/Environment              Frequency  Min 2X/week        Progress Toward Goals  OT Goals(current goals can now be found in the care plan section)  Progress towards OT goals: Progressing  toward goals  Acute Rehab OT Goals Patient Stated Goal: to eat OT Goal Formulation: With patient Time For Goal Achievement: 01/10/20 Potential to Achieve Goals: Good  Plan Discharge plan remains appropriate    Co-evaluation    PT/OT/SLP Co-Evaluation/Treatment: Yes Reason for Co-Treatment: For patient/therapist safety;To address functional/ADL transfers   OT goals addressed during session: ADL's and self-care      AM-PAC OT "6 Clicks" Daily Activity     Outcome Measure   Help from another person eating meals?: A Little Help from another person taking care of personal grooming?: A Lot Help from another person toileting, which includes using toliet, bedpan, or urinal?: Total Help from another person bathing (including washing, rinsing, drying)?: A Lot Help from another person to put on and taking off regular upper body clothing?: A Lot Help from another person to put on and taking off regular lower body clothing?: Total 6 Click Score: 11    End of Session Equipment Utilized During Treatment: Gait belt;Rolling walker;Oxygen  OT Visit Diagnosis: Unsteadiness on feet (R26.81);Other abnormalities of gait and mobility (R26.89);Pain;Other symptoms and signs involving cognitive function;Low vision, both eyes (H54.2);Muscle weakness (generalized) (M62.81) Pain - Right/Left: Right   Activity Tolerance Other (comment)(Limited by confusion)   Patient Left in chair;with call bell/phone within reach;with chair alarm set   Nurse Communication Mobility status        Time: [phone] OT Time Calculation (min): 36 min  Charges: OT General Charges $OT Visit: 1 Visit OT Treatments $Self Care/Home Management : 8-22 mins  Ann Sellers, OTR/L    Ann Sellers 12/29/2019, 10:34 AM

## 2019-12-29 NOTE — Progress Notes (Signed)
Subjective:  UOP not well recorded.  Renal function steady-  k of 5.0 Objective Vital signs in last 24 hours: Vitals:   12/28/19 2001 12/28/19 2252 12/29/19 0731 12/29/19 0830  BP: (!) 174/74 (!) 141/54  (!) 163/56  Pulse: 81 78  91  Resp: 20 20  (!) 24  Temp: 98.9 F (37.2 C) 98.1 F (36.7 C)  98.6 F (37 C)  TempSrc: Axillary Axillary    SpO2: 96% 96% 91% 96%  Weight:      Height:       Weight change:   Intake/Output Summary (Last 24 hours) at 12/29/2019 1606 Last data filed at 12/28/2019 2005 Gross per 24 hour  Intake --  Output 200 ml  Net -200 ml    Assessment/Plan: 86 year old white female with hypertension, diabetes, aortic stenosis and CKD.  She presents with aspiration pneumonia.  Hospitalization complicated by slight worsening in CKD with hyperkalemia 1.Renal-longstanding CKD at baseline (crt around 2).  Likely secondary to hypertension and diabetes as well as age-related nephrosclerosis.  Urinalysis is bland.  No particular reasoning for slight worsening just being in the hospital and having other medical conditions 2.  Hyperkalemia-as a result of her decreased GFR.  Has not reached dangerous range.  For management I would recommend oral sodium bicarbonate, attempted control of potassium in diet and she may need some Lokelma maybe 3 times a week to keep in check 3. Hypertension/volume  -blood pressure is reasonable for 86 year old 4. Anemia  -likely related to CKD and hospitalization.  Supportive care for now 5. hyperphos- phos is 6 so I guess pholslyra is appropriate but not absolutely needed- OK to continue  Renal will sign off as labs are fairly stable.  I will make sure that she has follow up with Dr. Posey Pronto at [facility]- call with questions    Louis Meckel    Labs: Basic Metabolic Panel: Recent Labs  Lab 12/28/19 0813 12/28/19 1502 12/29/19 0303  NA 142 140 140  K 5.4* 5.3* 5.0  CL 106 108 107  CO2 23 22 19*  GLUCOSE 115* 133* 117*  BUN 56* 58* 59*   CREATININE 2.85* 2.86* 2.83*  CALCIUM 8.7* 8.2* 8.7*  PHOS  --   --  6.0*   Liver Function Tests: Recent Labs  Lab 12/26/19 0913 12/27/19 0327 12/29/19 0303  AST 25 18  --   ALT 13 13  --   ALKPHOS 86 63  --   BILITOT 0.7 0.8  --   PROT 5.7* 5.8*  --   ALBUMIN 3.2* 3.1* 3.0*   No results for input(s): LIPASE, AMYLASE in the last 168 hours. No results for input(s): AMMONIA in the last 168 hours. CBC: Recent Labs  Lab 12/26/19 0913 12/26/19 0913 12/27/19 0327 12/28/19 0158 12/29/19 0303  WBC 10.5   < > 8.4 8.3 6.6  NEUTROABS 9.4*  --  6.8  --   --   HGB 11.2*   < > 10.8* 9.2* 9.1*  HCT 37.1   < > 36.7 30.3* 30.3*  MCV 101.6*  --  104.6* 101.0* 102.4*  PLT 196   < > 147* 123* 127*   < > = values in this interval not displayed.   Cardiac Enzymes: No results for input(s): CKTOTAL, CKMB, CKMBINDEX, TROPONINI in the last 168 hours. CBG: Recent Labs  Lab 12/28/19 1215 12/28/19 1547 12/28/19 2018 12/29/19 0734 12/29/19 1216  GLUCAP 110* 124* 120* 103* 106*    Iron Studies: No results for input(s): IRON,  TIBC, TRANSFERRIN, FERRITIN in the last 72 hours. Studies/Results: No results found. Medications: Infusions: . ampicillin-sulbactam (UNASYN) IV 1.5 g (12/29/19 1413)    Scheduled Medications: . allopurinol  50 mg Oral QODAY  . aspirin  81 mg Oral Daily  . calcitRIOL  0.25 mcg Oral Daily  . calcium acetate (Phos Binder)  667 mg Oral TID WC  . dextrose  1 ampule Intravenous Once  . enoxaparin (LOVENOX) injection  30 mg Subcutaneous Q24H  . gabapentin  300 mg Oral QHS  . loratadine  10 mg Oral Daily  . metoprolol tartrate  25 mg Oral BID  . mometasone-formoterol  2 puff Inhalation BID  . pantoprazole sodium  40 mg Oral Daily  . sodium bicarbonate  650 mg Oral BID  . sodium zirconium cyclosilicate  5 g Oral Once per day on Mon Wed Fri    have reviewed scheduled and prn medications.  Physical Exam: General: elderly female-  Confused but was able to pull up  some things (that she saw Dr. Posey Pronto in our office, she thinks she missed an appt this week-  Daughter says she is right)  Heart:RRR Lungs: moslty clear Abdomen: soft, non tender Extremities: bruised-  Min edema    12/29/2019,4:06 PM  LOS: 3 days

## 2019-12-29 NOTE — Progress Notes (Signed)
NAME:  Ann Sellers, MRN:  [id number], DOB:  [birth year], LOS: 3 ADMISSION DATE:  12/26/2019  Subjective/Interm history  No overnight events.  Expresses frustration over her vision loss but otherwise no complaints.  Objective   Blood pressure (!) 163/56, pulse 91, temperature 98.6 F (37 C), resp. rate (!) 24, height 5' (1.524 m), weight 85.9 kg, SpO2 96 %.     Intake/Output Summary (Last 24 hours) at 12/29/2019 1055 Last data filed at 12/28/2019 2005 Gross per 24 hour  Intake 60 ml  Output 200 ml  Net -140 ml   Filed Weights   12/26/19 1000 12/28/19 0500  Weight: 81.1 kg 85.9 kg    Examination: GENERAL: in no acute distress CARDIAC: heart RRR. Systolic murmur present. PULMONARY:  Remains on 3L [place]. Bilateral crackles improving. Skin: dressing tact to RLE for skin tear. Senile purpura throughout  Significant Diagnostic Tests:  3/7 CXR: retrocardiac opacity 3/7 CT a/p: LLL airspace opacity. RLL nodular density--infectious vs inflammatory--repeat noncontrast CT in 52mo. 4.3cm low attenuation lesion in right adnexal region, presumably ovarian cyst. furthe revaluation with nonemergent pelvic ultrasound recommended to r/o neoplasm.   Micro Data:  3/7 blood culture>NGTD  Antimicrobials:  Azithromycin 3/7>>3/8 Rocephin 3/7>>3/8 Unasyn 3/8>>  Labs    CBC Latest Ref Rng & Units 12/29/2019 12/28/2019 12/27/2019  WBC 4.0 - 10.5 K/uL 6.6 8.3 8.4  Hemoglobin 12.0 - 15.0 g/dL 9.1(L) 9.2(L) 10.8(L)  Hematocrit 36.0 - 46.0 % 30.3(L) 30.3(L) 36.7  Platelets 150 - 400 K/uL 127(L) 123(L) 147(L)   BMP Latest Ref Rng & Units 12/29/2019 12/28/2019 12/28/2019  Glucose 70 - 99 mg/dL 117(H) 133(H) 115(H)  BUN 8 - 23 mg/dL 59(H) 58(H) 56(H)  Creatinine 0.44 - 1.00 mg/dL 2.83(H) 2.86(H) 2.85(H)  BUN/Creat Ratio 12 - 28 - - -  Sodium 135 - 145 mmol/L 140 140 142  Potassium 3.5 - 5.1 mmol/L 5.0 5.3(H) 5.4(H)  Chloride 98 - 111 mmol/L 107 108 106  CO2 22 - 32 mmol/L 19(L) 22 23  Calcium 8.9 - 10.3  mg/dL 8.7(L) 8.2(L) 8.7(L)    Summary  86 yo female presenting with 1d history of shortness of breath and fever who was found to have aspiration pneumonia.   Assessment & Plan:  Principal Problem:   Pneumonia Active Problems:   Hypertension   Chronic kidney disease (CKD), stage IV (severe) (HCC)   Chronic diastolic heart failure (HCC)   Pressure injury of skin  Aspiration pneumonia Afebrile overnight. Remains on 3L supplemental O2.  Plan Continue unasyn--day #4/7-10. Will plan to transition to Augmentin tomorrow. Wean supplemental O2 as able  AKI on CKD IV. Hyperkalemia Hyperphosphatemia.  Baseline Cr appears to be around 2.  Stable from 3/9. Potassium wnl today.Phos elevated.  Appreciate nephrology's consultation. Plan: sodium bicarb 650mg  bid--started 3/9, phoslyra tid-started 3/9,  low potassium diet, lokelma 3x weekly. Will continue to monitor.  Confusion. Seems to somewhat worsened since admission. I suspect this is multifactorial involving her diminished visual acuity 2/2 macular degeneration, in a new environment where she does not know where things are and is probably being compounded by her infection. She expresses frustration related to her vision and not being able to do anything for herself. I would expect that this would improve once she returns to a familiar environment. Her daughter reports that she functions independently at home only requiring minimal assistance with iADLs.  T2DM. Glucoses appropriate. No pharmaceutical management required at this time.  Paroxysmal atrial fibrillation not on anticoagulation due to history  of bleeding. Follows with Dr. Meda Coffee in cardiology. Remains in SR Chronic HFpEF with severe AS. Does not appear hypervolemic at this time however weight up 5kg since admission. Net +2.3L since admission. Hypertension. Metoprolol XL transitioned to metoprolol 25mg  bid due to inability to crush xr Plan:  Metoprolol 25mg  bid Continuous tele  monitoring ; Daily weights. Strict I/O  Best practice:  CODE STATUS: DNR Diet: cardiac; dysphagia 1 DVT for prophylaxis: lovenox Dispo: pending further eval and management; PT/OT recommending SNF- FL2 completed. Will need follow up for lung nodules and adnexal findings on CT after discharge.   Mitzi Hansen, MD INTERNAL MEDICINE RESIDENT PGY-1 PAGER #: [phone] 12/29/19  10:55 AM

## 2019-12-29 NOTE — TOC Progression Note (Signed)
Transition of Care [facility]) - Progression Note    Patient Details  Name: Ann Sellers MRN: [id number] Date of Birth: 12/02/1924  Transition of [facility]) CM/SW [place], LCSW Phone Number: 12/29/2019, 3:15 PM  Clinical Narrative:     CSW spoke with patient's son [name] and he states that the family would like for the patient to return home with home health services at discharge. [name] states that he is home with the patient majority of the time. She has 6 children so they are able to provide the care that she needs.   CSW has requested that PT/OT evaluate for home DME needs.   TOC will continue to follow for dispo planning.   Expected Discharge Plan: [place] Barriers to Discharge: Continued Medical Work up  Expected Discharge Plan and Services Expected Discharge Plan: [place] arrangements for the past 2 months: Single Family Home                                       Social Determinants of Health (SDOH) Interventions    Readmission Risk Interventions No flowsheet data found.

## 2019-12-30 ENCOUNTER — Inpatient Hospital Stay (HOSPITAL_COMMUNITY): Payer: Medicare Other

## 2019-12-30 DIAGNOSIS — Z86011 Personal history of benign neoplasm of the brain: Secondary | ICD-10-CM

## 2019-12-30 DIAGNOSIS — S81811A Laceration without foreign body, right lower leg, initial encounter: Secondary | ICD-10-CM

## 2019-12-30 DIAGNOSIS — I130 Hypertensive heart and chronic kidney disease with heart failure and stage 1 through stage 4 chronic kidney disease, or unspecified chronic kidney disease: Secondary | ICD-10-CM

## 2019-12-30 DIAGNOSIS — J9600 Acute respiratory failure, unspecified whether with hypoxia or hypercapnia: Secondary | ICD-10-CM | POA: Diagnosis present

## 2019-12-30 DIAGNOSIS — R4182 Altered mental status, unspecified: Secondary | ICD-10-CM

## 2019-12-30 DIAGNOSIS — I4891 Unspecified atrial fibrillation: Secondary | ICD-10-CM

## 2019-12-30 LAB — CBC
HCT: 27.6 % — ABNORMAL LOW (ref 36.0–46.0)
Hemoglobin: 8.4 g/dL — ABNORMAL LOW (ref 12.0–15.0)
MCH: 30.5 pg (ref 26.0–34.0)
MCHC: 30.4 g/dL (ref 30.0–36.0)
MCV: 100.4 fL — ABNORMAL HIGH (ref 80.0–100.0)
Platelets: 120 10*3/uL — ABNORMAL LOW (ref 150–400)
RBC: 2.75 MIL/uL — ABNORMAL LOW (ref 3.87–5.11)
RDW: 14.6 % (ref 11.5–15.5)
WBC: 5.2 10*3/uL (ref 4.0–10.5)
nRBC: 0 % (ref 0.0–0.2)

## 2019-12-30 LAB — BLOOD GAS, ARTERIAL
Acid-base deficit: 1.5 mmol/L (ref 0.0–2.0)
Acid-base deficit: 0.5 mmol/L (ref 0.0–2.0)
Acid-base deficit: 0.6 mmol/L (ref 0.0–2.0)
Bicarbonate: 25.1 mmol/L (ref 20.0–28.0)
Bicarbonate: 25 mmol/L (ref 20.0–28.0)
Bicarbonate: 25 mmol/L (ref 20.0–28.0)
Drawn by: 535271
Drawn by: 30136
FIO2: 32
FIO2: 30
FIO2: 30
O2 Saturation: 95.1 %
O2 Saturation: 95.3 %
O2 Saturation: 83.5 %
Patient temperature: 36.8
Patient temperature: 36.5
Patient temperature: 36.7
pCO2 arterial: 61.6 mmHg — ABNORMAL HIGH (ref 32.0–48.0)
pCO2 arterial: 50 mmHg — ABNORMAL HIGH (ref 32.0–48.0)
pCO2 arterial: 51.3 mmHg — ABNORMAL HIGH (ref 32.0–48.0)
pH, Arterial: 7.233 — ABNORMAL LOW (ref 7.350–7.450)
pH, Arterial: 7.317 — ABNORMAL LOW (ref 7.350–7.450)
pH, Arterial: 7.306 — ABNORMAL LOW (ref 7.350–7.450)
pO2, Arterial: 81.3 mmHg — ABNORMAL LOW (ref 83.0–108.0)
pO2, Arterial: 81.6 mmHg — ABNORMAL LOW (ref 83.0–108.0)
pO2, Arterial: 52.1 mmHg — ABNORMAL LOW (ref 83.0–108.0)

## 2019-12-30 LAB — RENAL FUNCTION PANEL
Albumin: 2.9 g/dL — ABNORMAL LOW (ref 3.5–5.0)
Anion gap: 13 (ref 5–15)
BUN: 59 mg/dL — ABNORMAL HIGH (ref 8–23)
CO2: 22 mmol/L (ref 22–32)
Calcium: 9.1 mg/dL (ref 8.9–10.3)
Chloride: 108 mmol/L (ref 98–111)
Creatinine, Ser: 2.51 mg/dL — ABNORMAL HIGH (ref 0.44–1.00)
GFR calc Af Amer: 18 mL/min — ABNORMAL LOW (ref >60)
GFR calc non Af Amer: 16 mL/min — ABNORMAL LOW (ref >60)
Glucose, Bld: 148 mg/dL — ABNORMAL HIGH (ref 70–99)
Phosphorus: 5.9 mg/dL — ABNORMAL HIGH (ref 2.5–4.6)
Potassium: 5 mmol/L (ref 3.5–5.1)
Sodium: 143 mmol/L (ref 135–145)

## 2019-12-30 LAB — GLUCOSE, CAPILLARY
Glucose-Capillary: 102 mg/dL — ABNORMAL HIGH (ref 70–99)
Glucose-Capillary: 84 mg/dL (ref 70–99)
Glucose-Capillary: 85 mg/dL (ref 70–99)
Glucose-Capillary: 79 mg/dL (ref 70–99)

## 2019-12-30 MED ORDER — ALBUTEROL SULFATE (2.5 MG/3ML) 0.083% IN NEBU
2.5000 mg | INHALATION_SOLUTION | Freq: Four times a day (QID) | RESPIRATORY_TRACT | Status: DC
  Administered 2019-12-30 (×2): 2.5 mg via RESPIRATORY_TRACT
  Filled 2019-12-30 (×2): qty 3

## 2019-12-30 MED ORDER — FUROSEMIDE 40 MG PO TABS
40.0000 mg | ORAL_TABLET | Freq: Once | ORAL | Status: DC
  Filled 2019-12-30: qty 1

## 2019-12-30 MED ORDER — IPRATROPIUM-ALBUTEROL 0.5-2.5 (3) MG/3ML IN SOLN
3.0000 mL | Freq: Four times a day (QID) | RESPIRATORY_TRACT | Status: DC
  Administered 2019-12-30 – 2019-12-31 (×3): 3 mL via RESPIRATORY_TRACT
  Filled 2019-12-30 (×3): qty 3

## 2019-12-30 MED ORDER — FUROSEMIDE 10 MG/ML IJ SOLN
20.0000 mg | Freq: Once | INTRAMUSCULAR | Status: AC
  Administered 2019-12-30: 20 mg via INTRAVENOUS
  Filled 2019-12-30: qty 2

## 2019-12-30 MED ORDER — ALBUTEROL SULFATE (2.5 MG/3ML) 0.083% IN NEBU: 2.5000 mg | INHALATION_SOLUTION | Freq: Two times a day (BID) | RESPIRATORY_TRACT | Status: DC

## 2019-12-30 MED ORDER — METOPROLOL TARTRATE 50 MG PO TABS
50.0000 mg | ORAL_TABLET | Freq: Two times a day (BID) | ORAL | Status: DC
  Administered 2019-12-30 – 2020-01-01 (×4): 50 mg via ORAL
  Filled 2019-12-30 (×5): qty 1

## 2019-12-30 NOTE — Progress Notes (Addendum)
OVERNIGHT EVENTS:    1940: While in another patient room, received a call that pt. HR in the 160's. Upon assessment, pt seemed to be in a-fib RVR. BP obtained and stable in the 150's/70's, O2 stable at 97%. MD notified. STAT EKG obtained confirming a-fib RVR. MD at bedside. Amio bolus given along with amio gtt started at 1mg /min per verbal orders.   2225: Pt. HR not improving (150's-170's) and patient began wheezing with worsening mental status and confusion. MD notified and again at bedside. Pt vitals remain stable. Amio bolus given again and Metoprolol 5mg  IV given per verbal order.   2308: While in patient's room, received call from telemetry that patient had 6.89 second pause, and HR was in the 30's. Pt. Still awake and talking in no signs of distress at this time. Assessed rhythm on monitor, pt. HR in 30's. Amio gtt stopped. HR to 60's-70's quickly. MD notified.   Patient's mental status waxing and waning throughout shift. Breathing intermittently labored with accessory muscle use and difficulty completing sentences. This subsides when patient is sleeping. Vital signs remain stable.

## 2019-12-30 NOTE — Progress Notes (Signed)
RN spoke with 2W director [place]. Approval for siblings to rotate who visits with pt has been received. It must be one person at a time. RN spoke with pt's daughter Ann Sellers (who is at bedside) of director's decision. She verbalized understanding.  The following children of pt are allowed to visit:  Althea Grimmer [place]

## 2019-12-30 NOTE — Progress Notes (Signed)
Reassessed at bedside. Daughter present and in tears. Stated her mother has been talking about being "ok with dying" since she arrived as well as seeing her husband (who has been deceased for several decades) standing in the corner of the room. I witnessed this while in the room as well as patient talking to the deceased husband. She states that she has lived a good life Patient appears comfortable and is on BiPAP. Does not appeared to be in any distress and is actually more alert than she was this morning. No other changes since prior assessment.  Updated daughter on overnight events and she agrees with plan to continue antibiotic/BiPAP treatment for now. Will continue to monitor.  Mitzi Hansen, MD 12/30/19 3:29 PM

## 2019-12-30 NOTE — Progress Notes (Signed)
RT placed patient on bipap per MD order. Patient tolerating well at this time. Patient says breathing is comfortable on bipap. RT will continue to monitor.

## 2019-12-30 NOTE — Progress Notes (Signed)
NAME:  INES REBEL, MRN:  [id number], DOB:  [birth year], LOS: 4 ADMISSION DATE:  12/26/2019  Subjective/Interm history  Less alert this morning.  Objective   Blood pressure 129/69, pulse 65, temperature 98.3 F (36.8 C), resp. rate (!) 24, height 5' (1.524 m), weight 85.9 kg, SpO2 96 %.     Intake/Output Summary (Last 24 hours) at 12/30/2019 0506 Last data filed at 12/30/2019 0000 Gross per 24 hour  Intake 619.86 ml  Output 600 ml  Net 19.86 ml   Filed Weights   12/26/19 1000 12/28/19 0500  Weight: 81.1 kg 85.9 kg    Examination: GENERAL: ill appearing but not toxic CARDIAC: heart RRR. Systolic murmur present. PULMONARY:  on 6L [place]. Bilateral crackles improving. Skin: dressing tact to RLE for skin tear. Senile purpura throughout NEURO: awakens to voice but not alert. Minimal responses to questions but does follow commands. Moving all 4 extremities with 5/5 strength however weakness RUE>LUE. No apparent facial droop however exam limited by patient's mental status.  Significant Diagnostic Tests:  3/7 CXR: retrocardiac opacity 3/11 CXR: LLL consolidation; small/trace bilateral effusions; cardiomegaly with pulmonary venous congestion--no frank pulm edema  3/7 CT a/p: LLL airspace opacity. RLL nodular density--infectious vs inflammatory--repeat noncontrast CT in 37mo. 4.3cm low attenuation lesion in right adnexal region, presumably ovarian cyst. furthe revaluation with nonemergent pelvic ultrasound recommended to r/o neoplasm.   3/10 head CT: no acute findings; sphenoidal mass slightly increased in size--compatible with growing meningioma  Micro Data:  3/7 blood culture>NGTD 3/9 UC>>NGTD  Antimicrobials:  Azithromycin 3/7>>3/8 Rocephin 3/7>>3/8 Unasyn 3/8>>  Labs    CBC Latest Ref Rng & Units 12/29/2019 12/28/2019 12/27/2019  WBC 4.0 - 10.5 K/uL 6.6 8.3 8.4  Hemoglobin 12.0 - 15.0 g/dL 9.1(L) 9.2(L) 10.8(L)  Hematocrit 36.0 - 46.0 % 30.3(L) 30.3(L) 36.7  Platelets 150 - 400  K/uL 127(L) 123(L) 147(L)   BMP Latest Ref Rng & Units 12/30/2019 12/29/2019 12/28/2019  Glucose 70 - 99 mg/dL 148(H) 117(H) 133(H)  BUN 8 - 23 mg/dL 59(H) 59(H) 58(H)  Creatinine 0.44 - 1.00 mg/dL 2.51(H) 2.83(H) 2.86(H)  BUN/Creat Ratio 12 - 28 - - -  Sodium 135 - 145 mmol/L 143 140 140  Potassium 3.5 - 5.1 mmol/L 5.0 5.0 5.3(H)  Chloride 98 - 111 mmol/L 108 107 108  CO2 22 - 32 mmol/L 22 19(L) 22  Calcium 8.9 - 10.3 mg/dL 9.1 8.7(L) 8.2(L)    Summary  86 yo female presenting with 1d history of shortness of breath and fever who was found to have aspiration pneumonia.   Assessment & Plan:  Principal Problem:   Pneumonia Active Problems:   Hypertension   Chronic kidney disease (CKD), stage IV (severe) (HCC)   Chronic diastolic heart failure (HCC)   Pressure injury of skin  Acute respiratory failure due to Aspiration pneumonia  Respiratory Acidosis -Progressive shortness of breath/wheezing overnight requiring increase to 6L [place].  -Obtained repeat CXR which did show a LLL infiltrate and small bilateral effusions. ABG consistent with a respiratory acidosis.  I don't think this is attributable to worsening infection--remains afebrile and still no leukocytosis--however can not r/o progressive infection. I was also considering heart failure exacerbation however clinically, she appears euvolemic and CXR findings not consistent with pulm edema. -Prophylactically anticoag with lovenox so fairly low suspicion for PE -if respiratory status continues to decline, another consideration would be malignancy in the setting of lung nodule findings seen on prior CT chest AMS. Worse today--minimally responsive but was following commands--suspect  change is attributable to pna compounded by resp acidosis and limited visual acuity. Exam did show some right sided weakness in the upper extremity however unclear if this is due to an old rotator cuff injury vs stroke. CT head obtained to r/o stroke vs bleed was  negative however did show a slight increase in size of previously documented meningioma. Plan Continue unasyn--treatment day #5/7.  BiPAP; NPO while on BiPAP  Paroxysmal atrial fibrillation not on anticoagulation due to history of bleeding. Follows with Dr. Meda Coffee in cardiology.  Afib with RVR 2/10requiring 2 amiodarone boluses and 5mg  metoprolol. Converted back to SR. Suspect the conversion into afib RVR due to infection Chronic HFpEF with severe AS.  Hypertension. Blood pressures stable this morning. Plan:  Metoprolol 25mg  bid Continuous tele monitoring ; Daily weights. Strict I/O  AKI on CKD IV. Hyperkalemia Hyperphosphatemia.  Baseline Cr appears to be around 2. Improvement 2.8>2.5 today.  Good UOP Appreciate nephrology's consultation--signed off 3/10 Plan: sodium bicarb 650mg  bid--started 3/9, phoslyra tid-started 3/9,  low potassium diet, lokelma 3x weekly. Will continue to monitor with labs  T2DM. Glucoses appropriate. No pharmaceutical management required at this time.  Best practice:  CODE STATUS: DNR Diet: cardiac; dysphagia 1; NPO while on BiPAP DVT for prophylaxis: lovenox Dispo: pending further eval and management; PT/OT recommending SNF- FL2 completed. Will need follow up for lung nodules and adnexal findings on CT after discharge.   Mitzi Hansen, MD INTERNAL MEDICINE RESIDENT PGY-1 PAGER #: [phone] 12/30/19  5:06 AM

## 2019-12-30 NOTE — Progress Notes (Signed)
SLP Cancellation Note  Patient Details Name: Ann Sellers MRN: [id number] DOB: 03/26/1925   Cancelled treatment:       Reason Eval/Treat Not Completed: Medical issues which prohibited therapy(RN indicated that pt is currently requiring BiPAP. SLP will follow up on subsequent date.)  Ayden Apodaca I. Hardin Negus, [place], [place] Office number [phone] Pager [place] 12/30/2019, 3:15 PM

## 2019-12-31 DIAGNOSIS — J9602 Acute respiratory failure with hypercapnia: Secondary | ICD-10-CM

## 2019-12-31 DIAGNOSIS — E870 Hyperosmolality and hypernatremia: Secondary | ICD-10-CM

## 2019-12-31 LAB — RENAL FUNCTION PANEL
Albumin: 2.5 g/dL — ABNORMAL LOW (ref 3.5–5.0)
Anion gap: 15 (ref 5–15)
BUN: 58 mg/dL — ABNORMAL HIGH (ref 8–23)
CO2: 24 mmol/L (ref 22–32)
Calcium: 8.8 mg/dL — ABNORMAL LOW (ref 8.9–10.3)
Chloride: 108 mmol/L (ref 98–111)
Creatinine, Ser: 2.3 mg/dL — ABNORMAL HIGH (ref 0.44–1.00)
GFR calc Af Amer: 21 mL/min — ABNORMAL LOW (ref >60)
GFR calc non Af Amer: 18 mL/min — ABNORMAL LOW (ref >60)
Glucose, Bld: 82 mg/dL (ref 70–99)
Phosphorus: 4.8 mg/dL — ABNORMAL HIGH (ref 2.5–4.6)
Potassium: 4.9 mmol/L (ref 3.5–5.1)
Sodium: 147 mmol/L — ABNORMAL HIGH (ref 135–145)

## 2019-12-31 LAB — CULTURE, BLOOD (ROUTINE X 2)
Culture: NO GROWTH
Culture: NO GROWTH
Special Requests: ADEQUATE

## 2019-12-31 LAB — CBC
HCT: 27.6 % — ABNORMAL LOW (ref 36.0–46.0)
Hemoglobin: 8.5 g/dL — ABNORMAL LOW (ref 12.0–15.0)
MCH: 31 pg (ref 26.0–34.0)
MCHC: 30.8 g/dL (ref 30.0–36.0)
MCV: 100.7 fL — ABNORMAL HIGH (ref 80.0–100.0)
Platelets: 122 10*3/uL — ABNORMAL LOW (ref 150–400)
RBC: 2.74 MIL/uL — ABNORMAL LOW (ref 3.87–5.11)
RDW: 14.6 % (ref 11.5–15.5)
WBC: 4.1 10*3/uL (ref 4.0–10.5)
nRBC: 0 % (ref 0.0–0.2)

## 2019-12-31 LAB — GLUCOSE, CAPILLARY
Glucose-Capillary: 67 mg/dL — ABNORMAL LOW (ref 70–99)
Glucose-Capillary: 71 mg/dL (ref 70–99)
Glucose-Capillary: 154 mg/dL — ABNORMAL HIGH (ref 70–99)
Glucose-Capillary: 122 mg/dL — ABNORMAL HIGH (ref 70–99)

## 2019-12-31 MED ORDER — IPRATROPIUM-ALBUTEROL 0.5-2.5 (3) MG/3ML IN SOLN
3.0000 mL | Freq: Two times a day (BID) | RESPIRATORY_TRACT | Status: DC
  Administered 2019-12-31 – 2020-01-04 (×8): 3 mL via RESPIRATORY_TRACT
  Filled 2019-12-31 (×6): qty 3

## 2019-12-31 MED ORDER — HYDROXYZINE HCL 10 MG PO TABS
5.0000 mg | ORAL_TABLET | Freq: Once | ORAL | Status: AC
  Administered 2019-12-31: 5 mg via ORAL
  Filled 2019-12-31: qty 1

## 2019-12-31 NOTE — Progress Notes (Signed)
  Speech Language Pathology Treatment: Dysphagia  Patient Details Name: Ann Sellers MRN: [id number] DOB: August 30, 1925 Today's Date: 12/31/2019 Time: 1962-2297 SLP Time Calculation (min) (ACUTE ONLY): 12 min  Assessment / Plan / Recommendation Clinical Impression  Pt with significant improvements in mentation and concomitant resolution of dysphagia.  Dtr, Jacqlyn Larsen, present.  Pt able to feed herself graham crackers and water with normal attention, oral seal, and mastication. There was a brisk swallow response with much improved respiratory/swallowing coordination and no increased WOB. There were no s/s of aspiration, even when taxed with mixed solid/liquid consistencies.  Recommend advancing diet to regular solids, thin liquids.  Pt will need assistance with tray set-up and she should be positioned with HOB as high as possible. No further SLP f/u is warranted.  Our service will sign off.   HPI HPI: Pt is a 86 year old woman admitted on 12/26/18 with progressive SOB secondary to PNA. PMH: CHF, anemia, asthma, B12 deficiency, CKD IV, DM, HTN, moderate aortic stenosis, afib, R rotator cuff tear, HOH, vision impairment.  Choking/coughing incident was noted by nursing ten minutes after finishing lunch, briefly desaturating then improving.        SLP Plan  All goals met       Recommendations  Diet recommendations: Regular;Thin liquid Liquids provided via: Cup;Straw Medication Administration: Whole meds with puree Supervision: Staff to assist with self feeding Postural Changes and/or Swallow Maneuvers: Seated upright 90 degrees                Oral Care Recommendations: Oral care BID Follow up Recommendations: None SLP Visit Diagnosis: Dysphagia, unspecified (R13.10) Plan: All goals met       GO                Juan Quam Laurice 12/31/2019, 1:42 PM  Kharter Sestak L. Tivis Ringer, [place] Office number [phone] Pager [phone]

## 2019-12-31 NOTE — Progress Notes (Signed)
Pt requested to be off BIPAP for a few minutes. Removed bipap and placed pt on 2L , oral care provided at this time, let pt rest for 15 minutes. BIPAP now back on patient. Will CTM.

## 2019-12-31 NOTE — Progress Notes (Signed)
NAME:  Ann Sellers, MRN:  [id number], DOB:  06-06-1925, LOS: 5 ADMISSION DATE:  12/26/2019  Subjective/Interm history  Looks significantly better this morning. Wanting to go home. Denies any focal complaints.  Objective   Blood pressure (!) 156/58, pulse 74, temperature 98.1 F (36.7 C), temperature source Axillary, resp. rate 14, height 5' (1.524 m), weight 84.8 kg, SpO2 99 %.     Intake/Output Summary (Last 24 hours) at 12/31/2019 0514 Last data filed at 12/30/2019 2000 Gross per 24 hour  Intake --  Output 200 ml  Net -200 ml   Filed Weights   12/26/19 1000 12/28/19 0500 12/30/19 0500  Weight: 81.1 kg 85.9 kg 84.8 kg     Examination: GENERAL: overall appearance is improved from yesterday CARDIAC: heart RRR. Systolic murmur present. PULMONARY:  Back on 3L nasal [place] this morning breathing comfortably. Lung sounds improved since yesterday however crackles remain at left base. Skin: senile purpura throughout NEURO: awake and alert this morning. Able to carry on a conversation. Able to follow commands. Moving all 4 extremities.    Significant Diagnostic Tests:  3/7 CXR: retrocardiac opacity 3/11 CXR: LLL consolidation; small/trace bilateral effusions; cardiomegaly with pulmonary venous congestion--no frank pulm edema  3/7 CT a/p: LLL airspace opacity. RLL nodular density--infectious vs inflammatory--repeat noncontrast CT in 60mo. 4.3cm low attenuation lesion in right adnexal region, presumably ovarian cyst. furthe revaluation with nonemergent pelvic ultrasound recommended to r/o neoplasm.   3/10 head CT: no acute findings; sphenoidal mass slightly increased in size--compatible with growing meningioma  Micro Data:  3/7 blood culture>NGTD 3/9 UC>>NGTD  Antimicrobials:  Azithromycin 3/7>>3/8 Rocephin 3/7>>3/8 Unasyn 3/8>>  Labs    CBC Latest Ref Rng & Units 12/31/2019 12/30/2019 12/29/2019  WBC 4.0 - 10.5 K/uL 4.1 5.2 6.6  Hemoglobin 12.0 - 15.0 g/dL 8.5(L) 8.4(L) 9.1(L)   Hematocrit 36.0 - 46.0 % 27.6(L) 27.6(L) 30.3(L)  Platelets 150 - 400 K/uL 122(L) 120(L) 127(L)   BMP Latest Ref Rng & Units 12/31/2019 12/30/2019 12/29/2019  Glucose 70 - 99 mg/dL 82 148(H) 117(H)  BUN 8 - 23 mg/dL 58(H) 59(H) 59(H)  Creatinine 0.44 - 1.00 mg/dL 2.30(H) 2.51(H) 2.83(H)  BUN/Creat Ratio 12 - 28 - - -  Sodium 135 - 145 mmol/L 147(H) 143 140  Potassium 3.5 - 5.1 mmol/L 4.9 5.0 5.0  Chloride 98 - 111 mmol/L 108 108 107  CO2 22 - 32 mmol/L 24 22 19(L)  Calcium 8.9 - 10.3 mg/dL 8.8(L) 9.1 8.7(L)    Summary  86 yo female presenting with 1d history of shortness of breath and fever who was found to have aspiration pneumonia.   Assessment & Plan:  Principal Problem:   Pneumonia Active Problems:   Hypertension   Chronic kidney disease (CKD), stage IV (severe) (HCC)   Chronic diastolic heart failure (HCC)   Pressure injury of skin   Acute respiratory failure (HCC)  Acute hypercapnic respiratory failure due to Aspiration pneumonia. Repeat CXR 3/11 still significant for LLL infiltrate with small bilateral effusions. Remains afebrile without a leukocytosis however would suspect to have seen more improvement in yesterday's CXR after 5d of abx. Off BiPAP this morning. Overall appears to be improving today however I think we will need to watch for at least another day which will also complete her abx treatment. Respiratory Acidosis. Improved with BiPAP placement yesterday. AMS. Improved fairly significantly yesterday after the placement of BiPAP.  Plan Continue unasyn--treatment day #6/7.  BiPAP prn; NPO while on BiPAP  Paroxysmal atrial fibrillation not  on anticoagulation due to history of bleeding. Follows with Dr. Meda Coffee in cardiology.  Chronic HFpEF with severe AS.  Hypertension. Blood pressures continue to fluctuate quite a bit making it difficult to determine if she needs an increase in metoprolol. I would be inclined to ere on the side of caution and allow blood pressures  to be in 140s-150s but if she continues to have blood pressures in the 778E systolically, can consider increasing. Plan:  Metoprolol 25mg  bid Continuous tele monitoring ; Daily weights. Strict I/O  AKI on CKD IV. Baseline Cr appears to be around 2. Improvement 2.8>2.5>2.3 today. K Continues to have good UOP. K stable. Hypernatremia. Mild elevated at 145. Would consider dehydration however renal function appears to be improving so I'm not quite sure what the cause of this is. Plan: sodium bicarb 650mg  bid--started 3/9, phoslyra tid-started 3/9,  low potassium diet, lokelma 3x weekly. Will continue to monitor with labs  T2DM. Glucoses appropriate. No pharmaceutical management required at this time.  RESOLVED Afib with RVR requiring 2 amiodarone boluses and 5mg  metoprolol. Converted back to SR. Suspect the conversion into afib RVR due to infection  Best practice:  CODE STATUS: DNR Diet: cardiac; dysphagia 1; NPO while on BiPAP DVT for prophylaxis: lovenox Dispo: will likely need placement on, at least, a temporary basis for rehab; PT/OT recommending SNF- FL2 completed. Suspect, if she continues to improve, that she will be able to d/c in the next day or two. Will need follow up for lung nodules and adnexal findings on CT after discharge.   Mitzi Hansen, MD INTERNAL MEDICINE RESIDENT PGY-1 PAGER #: [phone] 12/31/19  5:14 AM

## 2019-12-31 NOTE — Plan of Care (Signed)
  Problem: Education: Goal: Knowledge of General Education information will improve Description: Including pain rating scale, medication(s)/side effects and non-pharmacologic comfort measures Outcome: Progressing   Problem: Health Behavior/Discharge Planning: Goal: Ability to manage health-related needs will improve Outcome: Progressing   Problem: Clinical Measurements: Goal: Ability to maintain clinical measurements within normal limits will improve Outcome: Progressing Goal: Will remain free from infection Outcome: Progressing Goal: Diagnostic test results will improve Outcome: Progressing Goal: Respiratory complications will improve Outcome: Progressing Goal: Cardiovascular complication will be avoided Outcome: Progressing   Problem: Activity: Goal: Risk for activity intolerance will decrease Outcome: Progressing   Problem: Nutrition: Goal: Adequate nutrition will be maintained Outcome: Progressing   Problem: Coping: Goal: Level of anxiety will decrease Outcome: Progressing   Problem: Elimination: Goal: Will not experience complications related to bowel motility Outcome: Progressing Goal: Will not experience complications related to urinary retention Outcome: Progressing   Problem: Pain Managment: Goal: General experience of comfort will improve 12/31/2019 2331 by Valora Corporal, RN Outcome: Progressing 12/31/2019 2329 by Valora Corporal, RN Note: Pt having generalized weakness   Problem: Safety: Goal: Ability to remain free from injury will improve Outcome: Progressing   Problem: Skin Integrity: Goal: Risk for impaired skin integrity will decrease Outcome: Progressing   Problem: Activity: Goal: Ability to tolerate increased activity will improve Outcome: Progressing   Problem: Clinical Measurements: Goal: Ability to maintain a body temperature in the normal range will improve Outcome: Progressing   Problem: Respiratory: Goal: Ability to  maintain adequate ventilation will improve 12/31/2019 2331 by Valora Corporal, RN Outcome: Progressing 12/31/2019 2329 by Valora Corporal, RN Note: Will try and help pt keep BIPAP on per shift.  Goal: Ability to maintain a clear airway will improve Outcome: Progressing

## 2019-12-31 NOTE — Progress Notes (Signed)
Patient is tolerating the Bi-pap well and her Vt and RR are within normal limits.

## 2020-01-01 LAB — RENAL FUNCTION PANEL
Albumin: 2.3 g/dL — ABNORMAL LOW (ref 3.5–5.0)
Anion gap: 11 (ref 5–15)
BUN: 51 mg/dL — ABNORMAL HIGH (ref 8–23)
CO2: 27 mmol/L (ref 22–32)
Calcium: 8.1 mg/dL — ABNORMAL LOW (ref 8.9–10.3)
Chloride: 106 mmol/L (ref 98–111)
Creatinine, Ser: 2.05 mg/dL — ABNORMAL HIGH (ref 0.44–1.00)
GFR calc Af Amer: 24 mL/min — ABNORMAL LOW (ref >60)
GFR calc non Af Amer: 20 mL/min — ABNORMAL LOW (ref >60)
Glucose, Bld: 118 mg/dL — ABNORMAL HIGH (ref 70–99)
Phosphorus: 4.1 mg/dL (ref 2.5–4.6)
Potassium: 4.5 mmol/L (ref 3.5–5.1)
Sodium: 144 mmol/L (ref 135–145)

## 2020-01-01 LAB — CBC
HCT: 26.7 % — ABNORMAL LOW (ref 36.0–46.0)
Hemoglobin: 8.1 g/dL — ABNORMAL LOW (ref 12.0–15.0)
MCH: 30.3 pg (ref 26.0–34.0)
MCHC: 30.3 g/dL (ref 30.0–36.0)
MCV: 100 fL (ref 80.0–100.0)
Platelets: 138 10*3/uL — ABNORMAL LOW (ref 150–400)
RBC: 2.67 MIL/uL — ABNORMAL LOW (ref 3.87–5.11)
RDW: 14.6 % (ref 11.5–15.5)
WBC: 4.1 10*3/uL (ref 4.0–10.5)
nRBC: 0 % (ref 0.0–0.2)

## 2020-01-01 LAB — BLOOD GAS, VENOUS
Acid-Base Excess: 3.8 mmol/L — ABNORMAL HIGH (ref 0.0–2.0)
Bicarbonate: 29.7 mmol/L — ABNORMAL HIGH (ref 20.0–28.0)
Drawn by: 5150
FIO2: 21
O2 Saturation: 61.7 %
Patient temperature: 37
pCO2, Ven: 61.6 mmHg — ABNORMAL HIGH (ref 44.0–60.0)
pH, Ven: 7.304 (ref 7.250–7.430)
pO2, Ven: 38.6 mmHg (ref 32.0–45.0)

## 2020-01-01 LAB — MAGNESIUM: Magnesium: 2.2 mg/dL (ref 1.7–2.4)

## 2020-01-01 LAB — GLUCOSE, CAPILLARY
Glucose-Capillary: 93 mg/dL (ref 70–99)
Glucose-Capillary: 144 mg/dL — ABNORMAL HIGH (ref 70–99)
Glucose-Capillary: 160 mg/dL — ABNORMAL HIGH (ref 70–99)
Glucose-Capillary: 145 mg/dL — ABNORMAL HIGH (ref 70–99)

## 2020-01-01 MED ORDER — METOPROLOL TARTRATE 50 MG PO TABS
50.0000 mg | ORAL_TABLET | Freq: Two times a day (BID) | ORAL | Status: AC
  Administered 2020-01-01: 50 mg via ORAL
  Filled 2020-01-01: qty 1

## 2020-01-01 MED ORDER — SENNOSIDES-DOCUSATE SODIUM 8.6-50 MG PO TABS: 1.0000 | ORAL_TABLET | Freq: Every evening | ORAL | Status: DC | PRN

## 2020-01-01 MED ORDER — DICLOFENAC SODIUM 1 % EX GEL
4.0000 g | Freq: Three times a day (TID) | CUTANEOUS | Status: DC
  Administered 2020-01-01 – 2020-01-04 (×9): 4 g via TOPICAL
  Filled 2020-01-01: qty 100

## 2020-01-01 MED ORDER — HYDROXYZINE HCL 10 MG PO TABS
5.0000 mg | ORAL_TABLET | Freq: Every evening | ORAL | Status: DC | PRN
  Administered 2020-01-01 – 2020-01-02 (×2): 5 mg via ORAL
  Filled 2020-01-01 (×3): qty 1

## 2020-01-01 MED ORDER — GABAPENTIN 600 MG PO TABS
300.0000 mg | ORAL_TABLET | Freq: Two times a day (BID) | ORAL | Status: DC
  Administered 2020-01-01 – 2020-01-04 (×7): 300 mg via ORAL
  Filled 2020-01-01 (×7): qty 1

## 2020-01-01 MED ORDER — METOPROLOL SUCCINATE ER 50 MG PO TB24
50.0000 mg | ORAL_TABLET | Freq: Every day | ORAL | Status: DC
  Administered 2020-01-02 – 2020-01-04 (×3): 50 mg via ORAL
  Filled 2020-01-01 (×3): qty 1

## 2020-01-01 NOTE — Progress Notes (Addendum)
   Subjective:   She is feeling much better today than the last few days although weak. She denies cough, sob or chest pain.  She does have pain in her right great toe and thinks it may be her gout. She says it doesn't hurt too bad except at night it is painful with just the sheet touching it but isn't bothering her much during the day. She states this pain was happening prior to coming into the hospital and she was taking her allopurinol. She endorses some but less pain in the LLE.   Objective:  Vital signs in last 24 hours: Vitals:   12/31/19 0807 12/31/19 1609 12/31/19 2128 12/31/19 2300  BP: (!) 131/51 (!) 156/54 (!) 171/67   Pulse: 81 74 73 72  Resp: 20 20 18 16   Temp: 98.8 F (37.1 C) 98.8 F (37.1 C) 98.8 F (37.1 C)   TempSrc:      SpO2: 100% 97% 97% 100%  Weight:      Height:        Constitution: NAD, appears stated age Cardio: RRR, IV/VI systolic murmur LUSB & RUSB, no LE edema  Respiratory: CTA, no w/r/r, on [place] Abdominal: NTTP, soft, non-distended MSK: moving all extremities, right great toe distal tip erythematous, TTP  Neuro: normal affect, a&ox3 Skin: c/d/i   Assessment/Plan:  Principal Problem:   Pneumonia Active Problems:   Hypertension   Chronic kidney disease (CKD), stage IV (severe) (HCC)   Chronic diastolic heart failure (HCC)   Pressure injury of skin   Acute respiratory failure ([place])  86yo female with PMH asthma, HFpEF, moderate-severe aortic stenosis, TIIDM presenting with 1day history of shortness of breath and cough, symptoms and history consistent with aspiration pneumonia.   Acute Hypercapnic Respiratory Failure 2/2 Aspiration Pneumonia Significantly improved today, required bipap overnight but is now on 2L [place]. Respiratory acidosis in the setting of aspiration pneumonia with history of asthma her entire life. She did not require supplemental O2 prior to admission and will need to be ambulated as she improves to determine if she will need to be  discharged with oxygen.   - will complete 7 days unasyn tomorrow - can switch to po if SNF becomes available  - bipap prn  -  Cont duonebs bid - f/u vbg  - ambulate with O2 tomorrow   Paroxysmal Atrial Fibrillation Episode of afib with rvr during hospitalization relieved with amiodarone bolus.  - switch back to her long acting toprol tomorrow with return to normal diet.   Gout  Would consider acute gout flare with reduction in allopurinol at admission due to her decreased renal function but she was having pain prior to this. She is also not very TTP currenly although endorses the pain is worse at night which can also be consistent with gout. Her gabapentin was also decreased due to renal impairment. Medications are limited by her decreased renal function but may consider prednisone if pain does not improve.  - increase gabapentin   CKD Stage III Cont. Phoslo, bicarb 650mg  bid, lokelma three times weekly 5mg  Hypernatremia - resolved  TIIDM Cont. SSI and lantus  VTE: lovenox IVF: none  Diet: HH/CM Code: DNR  Dispo: Anticipated discharge in 1-2 days.    Molli Hazard A, DO 01/01/2020, 6:57 AM Pager: [phone]

## 2020-01-01 NOTE — Progress Notes (Signed)
Spoke with pts daughter who would like an ABG and chest xray done to see if PNA is improving and BIPAP is helping.

## 2020-01-02 DIAGNOSIS — N183 Chronic kidney disease, stage 3 unspecified: Secondary | ICD-10-CM

## 2020-01-02 DIAGNOSIS — J45909 Unspecified asthma, uncomplicated: Secondary | ICD-10-CM

## 2020-01-02 DIAGNOSIS — E1140 Type 2 diabetes mellitus with diabetic neuropathy, unspecified: Secondary | ICD-10-CM

## 2020-01-02 LAB — GLUCOSE, CAPILLARY
Glucose-Capillary: 101 mg/dL — ABNORMAL HIGH (ref 70–99)
Glucose-Capillary: 160 mg/dL — ABNORMAL HIGH (ref 70–99)
Glucose-Capillary: 137 mg/dL — ABNORMAL HIGH (ref 70–99)
Glucose-Capillary: 155 mg/dL — ABNORMAL HIGH (ref 70–99)

## 2020-01-02 LAB — CBC
HCT: 26.8 % — ABNORMAL LOW (ref 36.0–46.0)
Hemoglobin: 8.1 g/dL — ABNORMAL LOW (ref 12.0–15.0)
MCH: 30.6 pg (ref 26.0–34.0)
MCHC: 30.2 g/dL (ref 30.0–36.0)
MCV: 101.1 fL — ABNORMAL HIGH (ref 80.0–100.0)
Platelets: 127 10*3/uL — ABNORMAL LOW (ref 150–400)
RBC: 2.65 MIL/uL — ABNORMAL LOW (ref 3.87–5.11)
RDW: 14.6 % (ref 11.5–15.5)
WBC: 4.4 10*3/uL (ref 4.0–10.5)
nRBC: 0 % (ref 0.0–0.2)

## 2020-01-02 LAB — RENAL FUNCTION PANEL
Albumin: 2.3 g/dL — ABNORMAL LOW (ref 3.5–5.0)
Anion gap: 9 (ref 5–15)
BUN: 54 mg/dL — ABNORMAL HIGH (ref 8–23)
CO2: 27 mmol/L (ref 22–32)
Calcium: 8.1 mg/dL — ABNORMAL LOW (ref 8.9–10.3)
Chloride: 106 mmol/L (ref 98–111)
Creatinine, Ser: 1.98 mg/dL — ABNORMAL HIGH (ref 0.44–1.00)
GFR calc Af Amer: 25 mL/min — ABNORMAL LOW (ref >60)
GFR calc non Af Amer: 21 mL/min — ABNORMAL LOW (ref >60)
Glucose, Bld: 133 mg/dL — ABNORMAL HIGH (ref 70–99)
Phosphorus: 4.8 mg/dL — ABNORMAL HIGH (ref 2.5–4.6)
Potassium: 5.2 mmol/L — ABNORMAL HIGH (ref 3.5–5.1)
Sodium: 142 mmol/L (ref 135–145)

## 2020-01-02 MED ORDER — SODIUM CHLORIDE 0.9 % IV SOLN
1.5000 g | Freq: Two times a day (BID) | INTRAVENOUS | Status: AC
  Administered 2020-01-02: 1.5 g via INTRAVENOUS
  Filled 2020-01-02: qty 4

## 2020-01-02 MED ORDER — SODIUM ZIRCONIUM CYCLOSILICATE 10 G PO PACK
10.0000 g | PACK | ORAL | Status: DC
  Administered 2020-01-02: 10 g via ORAL
  Filled 2020-01-02: qty 1

## 2020-01-02 NOTE — TOC Progression Note (Addendum)
Transition of Care [facility]) - Progression Note    Patient Details  Name: Ann Sellers MRN: [id number] Date of Birth: Jun 29, 1925  Transition of Care [facility]) CM/SW [place], [place] Phone Number: 01/02/2020, 12:04 PM  Clinical Narrative:    Update: CSW was contacted by patient's son Ann Sellers who requested CSW contact niece Ann Sellers [phone]. CSW made telephone call to niece and discussed discharge plan of SNF. Patient's niece stated she is worried patient is not fully aware of the discharge plan to SNF and she wants to be sure patient is fully involved. CSW expressed understanding and agreed to speak with patient.  CSW met bedside with patient and explained title and role. CSW attempted to engage patient in discussion on discharge and patient became upset, stating she is newly blind and almost deaf. Patient requested CSW speak with her later. CSW asked if there is anyone who can be contacted, patient stated family. CSW provided update to niece and DO. PT contacted and agreed to see patient. TOC team will continue to follow and assist with discharge planning needs.   12:05p: CSW contacted by DO Seawell informed patient's family no longer think they are able to meet patients needs with Home Health. CSW spoke with patient's son Ann Sellers and confirmed the plan has changed to SNF. Patient's son expressed he would like to speak with his siblings, but provided permission to have referrals sent to [facility].   CSW spoke with DO & paged weekend PT team to request patient be seen by PT for an updated progress note. TOC team will continue to follow and assist with discharge planning needs.   Expected Discharge Plan: [place] Barriers to Discharge: Continued Medical Work up  Expected Discharge Plan and Services Expected Discharge Plan: [place] arrangements for the past 2 months: Single Family Home                                        Social Determinants of Health (SDOH) Interventions    Readmission Risk Interventions No flowsheet data found.

## 2020-01-02 NOTE — TOC Progression Note (Signed)
Transition of Care [facility]) - Progression Note    Patient Details  Name: Ann Sellers MRN: [id number] Date of Birth: Jul 12, 1925  Transition of Care [facility]) CM/SW [place], [place] Phone Number: 01/02/2020, 5:35 PM  Clinical Narrative:    CSW met with patient and her son [name] bedside. Patient expressed understanding of PT recommendation for SNF and is in agreement. CSW provided patient's son with SNF Medicare.gov ratings.   New covid requested. Insurance authorization initiated ref #[id number]. TOC team will continue to follow and assist with discharge planning needs.   Expected Discharge Plan: [place] Barriers to Discharge: Continued Medical Work up  Expected Discharge Plan and Services Expected Discharge Plan: [place] arrangements for the past 2 months: Single Family Home                                       Social Determinants of Health (SDOH) Interventions    Readmission Risk Interventions No flowsheet data found.

## 2020-01-02 NOTE — Progress Notes (Signed)
Physical Therapy Treatment Patient Details Name: Ann Sellers MRN: [id number] DOB: [birth year] Today's Date: 01/02/2020    History of Present Illness Pt is a 86 year old woman admitted on 12/26/18 with progressive SOB secondary to PNA. PMH: CHF, anemia, asthma, B12 deficiency, CKD IV, DM, HTN, moderate aortic stenosis, afib, R rotator cuff tear, HOH, vision impairment.    PT Comments    Pt making slow progress towards their physical therapy goals. Requiring moderate assist for stand pivot transfer from bed to chair. SpO2 95% on 3L O2. Continues with weakness, balance impairments, decreased cardiopulmonary endurance, and cognitive deficits. D/c plan remains appropriate.    Follow Up Recommendations  SNF     Equipment Recommendations  Wheelchair (measurements PT);Wheelchair cushion (measurements PT);3in1 (PT)    Recommendations for Other Services       Precautions / Restrictions Precautions Precautions: Fall Precaution Comments: Pt with low vision. Restrictions Weight Bearing Restrictions: No    Mobility  Bed Mobility Overal bed mobility: Needs Assistance Bed Mobility: Sidelying to Sit     Supine to sit: Mod assist     General bed mobility comments: Provided hand over hand guidance for rail finding, pt able to progress BLE's off edge of bed, modA for trunk elevation to upright  Transfers Overall transfer level: Needs assistance Equipment used: None Transfers: Sit to/from [facility] Sit to Stand: Mod assist Stand pivot transfers: Mod assist       General transfer comment: Pt stood x 3 from edge of bed and able to pivot towards right on 3rd trial. ModA with face to face transfer and use of gait belt  Ambulation/Gait                 Stairs             Wheelchair Mobility    Modified Rankin (Stroke Patients Only)       Balance Overall balance assessment: Needs assistance Sitting-balance support: Single extremity supported;Feet  supported Sitting balance-Leahy Scale: Poor Sitting balance - Comments: Initially reliant on BUE support but able to progress to unilateral. Supervision for safety   Standing balance support: Bilateral upper extremity supported;During functional activity Standing balance-Leahy Scale: Poor                              Cognition Arousal/Alertness: Awake/alert Behavior During Therapy: WFL for tasks assessed/performed Overall Cognitive Status: Impaired/Different from baseline Area of Impairment: Memory;Safety/judgement;Awareness;Problem solving                     Memory: Decreased short-term memory   Safety/Judgement: Decreased awareness of safety;Decreased awareness of deficits Awareness: Emergent Problem Solving: Slow processing;Decreased initiation;Difficulty sequencing;Requires verbal cues;Requires tactile cues General Comments: Pt forgetful, asking questions multiple times. States she thought she was dead until 2 days ago. Has a good sense of humor      Exercises      General Comments        Pertinent Vitals/Pain Pain Assessment: Faces Faces Pain Scale: Hurts a little bit Pain Location: chronic shoulder/back pain Pain Descriptors / Indicators: Aching Pain Intervention(s): Monitored during session    Home Living                      Prior Function            PT Goals (current goals can now be found in the care plan section) Acute Rehab PT Goals  Patient Stated Goal: to eat PT Goal Formulation: With patient Time For Goal Achievement: 01/10/20 Potential to Achieve Goals: Fair    Frequency    Min 2X/week      PT Plan Current plan remains appropriate    Co-evaluation              AM-PAC PT "6 Clicks" Mobility   Outcome Measure  Help needed turning from your back to your side while in a flat bed without using bedrails?: A Little Help needed moving from lying on your back to sitting on the side of a flat bed without using  bedrails?: A Lot Help needed moving to and from a bed to a chair (including a wheelchair)?: A Lot Help needed standing up from a chair using your arms (e.g., wheelchair or bedside chair)?: A Lot Help needed to walk in hospital room?: Total Help needed climbing 3-5 steps with a railing? : Total 6 Click Score: 11    End of Session Equipment Utilized During Treatment: Gait belt;Oxygen Activity Tolerance: Other (comment)(limited by poor vision and hearing ) Patient left: in chair;with call bell/phone within reach;with chair alarm set Nurse Communication: Mobility status PT Visit Diagnosis: Unsteadiness on feet (R26.81);Other abnormalities of gait and mobility (R26.89);Muscle weakness (generalized) (M62.81);History of falling (Z91.81);Difficulty in walking, not elsewhere classified (R26.2)     Time: 9038-3338 PT Time Calculation (min) (ACUTE ONLY): 24 min  Charges:  $Therapeutic Activity: 23-37 mins                       Ann Sellers, PT, DPT Acute Rehabilitation Services Pager [phone] Office [phone]    Deno Etienne 01/02/2020, 3:37 PM

## 2020-01-02 NOTE — Progress Notes (Signed)
   Subjective:   Feeling like she is improving. She denies shortness of breath but does feel weak. Discussed that it will take time to improve and that she may need to use bipap intermittently at home as well. Her legs do not hurt except when people push on them.   Objective:  Vital signs in last 24 hours: Vitals:   01/01/20 0823 01/01/20 1611 01/01/20 2002 01/01/20 2042  BP:  (!) 158/54    Pulse:  73  72  Resp:  (!) 21  16  Temp:  98.1 F (36.7 C) 99.9 F (37.7 C)   TempSrc:   Axillary   SpO2: 98% 98%  98%  Weight:      Height:       Constitution: NAD, appears stated age Cardio: RRR, IV/VI systolic murmur, no LE edema Respiratory: CTA, no w/r/r, on [place] Neuro: normal affect, a&ox3 Skin: c/d/i   Assessment/Plan:  Principal Problem:   Pneumonia Active Problems:   Hypertension   Chronic kidney disease (CKD), stage IV (severe) (HCC)   Chronic diastolic heart failure (HCC)   Pressure injury of skin   Acute respiratory failure ([place])  86 year old female with past medical history of asthma, HFpEF, moderate to severe aortic stenosis, type 2 diabetes who presented with a 1 day history of shortness of breath and cough found to have aspiration pneumonia.  Acute hypercarbic respiratory failure secondary to aspiration pneumonia Continues to require 2 L nasal cannula.  Expect that she may need supplemental oxygen for some time and possibly intermittent BiPAP after discharge.  She did not require supplemental O2 prior to admission but has a history of asthma that no long required treatment.  Discussed how she is doing with family. Initially they were hopeful to care for her at home but with her deconditioned state secondary to her recent severe illness they are opting more for SNF placement to help regain strength.  -Consult social work for SNF placement -Complete antibiotic therapy today -Continue BiPAP as needed and duo nebs twice daily -Consult PT to assess oxygen requirements with  movement  Paroxysmal A. Fib Cont. toprol 50 mg qd   Neuropathy LE pain improved, only occurring when people push on her legs. Pain likely was secondary to her neuropathy with decreased gabapentin dose due to previously decreased renal function  - cont. Gabapentin 300 mg bid   CKD Stage III Hyperkalemia K 5.2 this morning -increase lokelma to 10g three times weekly -cont. phoslo -cont. Bicarb 650mg  bid   TIIDM Cont. ssi & lantus  VTE: lovenox IVF: none Diet: HH/CM Code: DNR  Dispo: Anticipated discharge pending SNF placement.   Marty Heck, DO 01/02/2020, 6:22 AM Pager: [phone]

## 2020-01-02 NOTE — Progress Notes (Signed)
Physical Therapy Treatment Patient Details Name: Ann Sellers MRN: [id number] DOB: [birth year] Today's Date: 01/02/2020    History of Present Illness Pt is a 86 year old woman admitted on 12/26/18 with progressive SOB secondary to PNA. PMH: CHF, anemia, asthma, B12 deficiency, CKD IV, DM, HTN, moderate aortic stenosis, afib, R rotator cuff tear, HOH, vision impairment.    PT Comments    Pt seen for additional session per RN request to return pt to bed. Pt requiring moderate assist for stand pivot transfer. SpO2 > 90% on 3L O2. Requires use of auditory/tactile cues due to vision loss. Will benefit from SNF to address weakness, decreased endurance, and balance.    Follow Up Recommendations  SNF     Equipment Recommendations  Wheelchair (measurements PT);Wheelchair cushion (measurements PT);3in1 (PT)    Recommendations for Other Services       Precautions / Restrictions Precautions Precautions: Fall Precaution Comments: Pt with low vision. Restrictions Weight Bearing Restrictions: No    Mobility  Bed Mobility Overal bed mobility: Needs Assistance Bed Mobility: Sit to Supine     Supine to sit: Mod assist     General bed mobility comments: ModA for BLE negotiation back into bed  Transfers Overall transfer level: Needs assistance Equipment used: None Transfers: Sit to/from [employer] Transfers Sit to Stand: Mod assist Stand pivot transfers: Mod assist       General transfer comment: Stand pivot from bed to chair with modA with face to face transfer and use of gait belt  Ambulation/Gait                 Stairs             Wheelchair Mobility    Modified Rankin (Stroke Patients Only)       Balance Overall balance assessment: Needs assistance Sitting-balance support: Single extremity supported;Feet supported Sitting balance-Leahy Scale: Poor Sitting balance - Comments: Initially reliant on BUE support but able to progress to unilateral.  Supervision for safety   Standing balance support: Bilateral upper extremity supported;During functional activity Standing balance-Leahy Scale: Poor                              Cognition Arousal/Alertness: Awake/alert Behavior During Therapy: WFL for tasks assessed/performed Overall Cognitive Status: Impaired/Different from baseline Area of Impairment: Memory;Safety/judgement;Awareness;Problem solving                     Memory: Decreased short-term memory   Safety/Judgement: Decreased awareness of safety;Decreased awareness of deficits Awareness: Emergent Problem Solving: Slow processing;Decreased initiation;Difficulty sequencing;Requires verbal cues;Requires tactile cues General Comments: Pt forgetful, asking questions multiple times. States she thought she was dead until 2 days ago. Has a good sense of humor      Exercises      General Comments        Pertinent Vitals/Pain Pain Assessment: Faces Faces Pain Scale: Hurts a little bit Pain Location: chronic shoulder/back pain Pain Descriptors / Indicators: Aching Pain Intervention(s): Monitored during session    Home Living                      Prior Function            PT Goals (current goals can now be found in the care plan section) Acute Rehab PT Goals Patient Stated Goal: to eat PT Goal Formulation: With patient Time For Goal Achievement: 01/10/20 Potential to Achieve Goals:  Fair    Frequency    Min 2X/week      PT Plan Current plan remains appropriate    Co-evaluation              AM-PAC PT "6 Clicks" Mobility   Outcome Measure  Help needed turning from your back to your side while in a flat bed without using bedrails?: A Little Help needed moving from lying on your back to sitting on the side of a flat bed without using bedrails?: A Lot Help needed moving to and from a bed to a chair (including a wheelchair)?: A Lot Help needed standing up from a chair using  your arms (e.g., wheelchair or bedside chair)?: A Lot Help needed to walk in hospital room?: Total Help needed climbing 3-5 steps with a railing? : Total 6 Click Score: 11    End of Session Equipment Utilized During Treatment: Gait belt;Oxygen Activity Tolerance: Patient tolerated treatment well Patient left: with call bell/phone within reach;in bed;with nursing/sitter in room Nurse Communication: Mobility status PT Visit Diagnosis: Unsteadiness on feet (R26.81);Other abnormalities of gait and mobility (R26.89);Muscle weakness (generalized) (M62.81);History of falling (Z91.81);Difficulty in walking, not elsewhere classified (R26.2)     Time: 1275-1700 PT Time Calculation (min) (ACUTE ONLY): 16 min  Charges:  $Therapeutic Activity: 8-22 mins                       Wyona Almas, PT, DPT Acute Rehabilitation Services Pager [phone] Office [phone]    Deno Etienne 01/02/2020, 4:16 PM

## 2020-01-02 NOTE — Plan of Care (Signed)

## 2020-01-03 DIAGNOSIS — G9340 Encephalopathy, unspecified: Secondary | ICD-10-CM | POA: Diagnosis present

## 2020-01-03 LAB — GLUCOSE, CAPILLARY
Glucose-Capillary: 127 mg/dL — ABNORMAL HIGH (ref 70–99)
Glucose-Capillary: 131 mg/dL — ABNORMAL HIGH (ref 70–99)
Glucose-Capillary: 140 mg/dL — ABNORMAL HIGH (ref 70–99)
Glucose-Capillary: 139 mg/dL — ABNORMAL HIGH (ref 70–99)

## 2020-01-03 LAB — CBC
HCT: 28.9 % — ABNORMAL LOW (ref 36.0–46.0)
Hemoglobin: 8.5 g/dL — ABNORMAL LOW (ref 12.0–15.0)
MCH: 30.7 pg (ref 26.0–34.0)
MCHC: 29.4 g/dL — ABNORMAL LOW (ref 30.0–36.0)
MCV: 104.3 fL — ABNORMAL HIGH (ref 80.0–100.0)
Platelets: 160 10*3/uL (ref 150–400)
RBC: 2.77 MIL/uL — ABNORMAL LOW (ref 3.87–5.11)
RDW: 14.6 % (ref 11.5–15.5)
WBC: 5.3 10*3/uL (ref 4.0–10.5)
nRBC: 0 % (ref 0.0–0.2)

## 2020-01-03 LAB — RENAL FUNCTION PANEL
Albumin: 2.4 g/dL — ABNORMAL LOW (ref 3.5–5.0)
Anion gap: 7 (ref 5–15)
BUN: 52 mg/dL — ABNORMAL HIGH (ref 8–23)
CO2: 30 mmol/L (ref 22–32)
Calcium: 8.5 mg/dL — ABNORMAL LOW (ref 8.9–10.3)
Chloride: 108 mmol/L (ref 98–111)
Creatinine, Ser: 1.92 mg/dL — ABNORMAL HIGH (ref 0.44–1.00)
GFR calc Af Amer: 26 mL/min — ABNORMAL LOW (ref >60)
GFR calc non Af Amer: 22 mL/min — ABNORMAL LOW (ref >60)
Glucose, Bld: 137 mg/dL — ABNORMAL HIGH (ref 70–99)
Phosphorus: 5.5 mg/dL — ABNORMAL HIGH (ref 2.5–4.6)
Potassium: 5.3 mmol/L — ABNORMAL HIGH (ref 3.5–5.1)
Sodium: 145 mmol/L (ref 135–145)

## 2020-01-03 LAB — BASIC METABOLIC PANEL
Anion gap: 4 — ABNORMAL LOW (ref 5–15)
BUN: 49 mg/dL — ABNORMAL HIGH (ref 8–23)
CO2: 32 mmol/L (ref 22–32)
Calcium: 8.3 mg/dL — ABNORMAL LOW (ref 8.9–10.3)
Chloride: 107 mmol/L (ref 98–111)
Creatinine, Ser: 1.89 mg/dL — ABNORMAL HIGH (ref 0.44–1.00)
GFR calc Af Amer: 26 mL/min — ABNORMAL LOW (ref >60)
GFR calc non Af Amer: 22 mL/min — ABNORMAL LOW (ref >60)
Glucose, Bld: 174 mg/dL — ABNORMAL HIGH (ref 70–99)
Potassium: 4.7 mmol/L (ref 3.5–5.1)
Sodium: 143 mmol/L (ref 135–145)

## 2020-01-03 LAB — MAGNESIUM: Magnesium: 2 mg/dL (ref 1.7–2.4)

## 2020-01-03 LAB — SARS CORONAVIRUS 2 (TAT 6-24 HRS): SARS Coronavirus 2: NEGATIVE

## 2020-01-03 MED ORDER — SODIUM ZIRCONIUM CYCLOSILICATE 10 G PO PACK
10.0000 g | PACK | ORAL | Status: DC
  Administered 2020-01-03: 10 g via ORAL
  Filled 2020-01-03 (×2): qty 1

## 2020-01-03 MED ORDER — LABETALOL HCL 5 MG/ML IV SOLN
5.0000 mg | Freq: Once | INTRAVENOUS | Status: AC
  Administered 2020-01-03: 5 mg via INTRAVENOUS
  Filled 2020-01-03: qty 4

## 2020-01-03 MED ORDER — SALINE SPRAY 0.65 % NA SOLN
1.0000 | NASAL | Status: DC | PRN
  Administered 2020-01-03: 1 via NASAL
  Filled 2020-01-03: qty 44

## 2020-01-03 NOTE — Social Work (Signed)
Insurance authorization ref [id number]. Good 3/16-3/18. Fax updated clinicals to [phone]  Criss Alvine, [place]

## 2020-01-03 NOTE — Discharge Summary (Addendum)
Name: Ann Sellers MRN: [id number] DOB: 08/17/1925 86 y.o. PCP: Reynold Bowen, MD  Date of Admission: 12/26/2019  8:30 AM Date of Discharge:  Attending Physician: Lucious Groves, DO  Discharge Diagnosis: 1. Acute hypoxic and hypercapnic respiratory failure due to Aspiration Pneumonia 2. Lung nodules/adenexal lesion 3. AKI on CKD stage 3b 4. Hyperkalemia due to decreased renal excretion 5. Chronic HFpEF with Severe AS 6. Paroxsymal Afib  Discharge Medications: Allergies as of 01/04/2020       Reactions   Losartan Cough   Snake Antivenin [antivenin Crotalidae Polyvalent (snake Antivenin)] Other (See Comments)   PARALYSIS        Medication List     STOP taking these medications    fexofenadine 180 MG tablet Commonly known as: ALLEGRA Replaced by: loratadine 10 MG tablet   gabapentin 300 MG capsule Commonly known as: NEURONTIN Replaced by: gabapentin 600 MG tablet   GNP Aspirin Low Dose 81 MG EC tablet Generic drug: aspirin Replaced by: aspirin 81 MG chewable tablet   insulin glargine 100 UNIT/ML injection Commonly known as: LANTUS   Januvia 50 MG tablet Generic drug: sitaGLIPtin       TAKE these medications    albuterol (2.5 MG/3ML) 0.083% nebulizer solution Commonly known as: PROVENTIL Take 2.5 mg by nebulization every 6 (six) hours as needed for wheezing or shortness of breath.   albuterol 108 (90 Base) MCG/ACT inhaler Commonly known as: VENTOLIN HFA Inhale 2 puffs into the lungs every 6 (six) hours as needed for wheezing or shortness of breath.   allopurinol 100 MG tablet Commonly known as: ZYLOPRIM Take 0.5 tablets (50 mg total) by mouth every other day. What changed:  how much to take when to take this   amLODipine 5 MG tablet Commonly known as: NORVASC Take 1 tablet (5 mg total) by mouth daily. Start taking on: January 05, 2020   aspirin 81 MG chewable tablet Chew 1 tablet (81 mg total) by mouth daily. Replaces: GNP Aspirin Low Dose 81  MG EC tablet   calcitRIOL 0.25 MCG capsule Commonly known as: ROCALTROL Take 1 capsule by mouth daily. What changed: Another medication with the same name was added. Make sure you understand how and when to take each.   calcitRIOL 1 MCG/ML solution Commonly known as: ROCALTROL Take 0.3 mLs (0.3 mcg total) by mouth daily. What changed: You were already taking a medication with the same name, and this prescription was added. Make sure you understand how and when to take each.   calcium acetate 667 MG capsule Commonly known as: PhosLo Take 1 capsule (667 mg total) by mouth 3 (three) times daily with meals.   diclofenac sodium 1 % Gel Commonly known as: VOLTAREN Apply 2 g topically daily as needed (pain).   diphenoxylate-atropine 2.5-0.025 MG tablet Commonly known as: LOMOTIL Take 1 tablet by mouth daily as needed for diarrhea or loose stools.   fluticasone 50 MCG/ACT nasal spray Commonly known as: FLONASE Place 2 sprays into both nostrils daily.   furosemide 40 MG tablet Commonly known as: LASIX TAKE 1 TABLET BY MOUTH EVERY DAY What changed: additional instructions   gabapentin 600 MG tablet Commonly known as: NEURONTIN Take 0.5 tablets (300 mg total) by mouth 2 (two) times daily. Replaces: gabapentin 300 MG capsule   HYDROcodone-acetaminophen 5-325 MG tablet Commonly known as: NORCO/VICODIN Take 0.5-1 tablets by mouth every 6 (six) hours as needed for moderate pain.   iron polysaccharides 150 MG capsule Commonly known as: NIFEREX Take  150 mg by mouth daily.   loratadine 10 MG tablet Commonly known as: CLARITIN Take 1 tablet (10 mg total) by mouth daily. Replaces: fexofenadine 180 MG tablet   metoprolol succinate 50 MG 24 hr tablet Commonly known as: TOPROL-XL TAKE 1 TABLET BY MOUTH EVERY DAY WITH FOOD OR IMMEDIATELY AFTER EATING What changed: See the new instructions.   mometasone-formoterol 100-5 MCG/ACT Aero Commonly known as: DULERA Take 2 puffs first thing  in am and then another 2 puffs about 12 hours later.   montelukast 10 MG tablet Commonly known as: SINGULAIR Take 10 mg by mouth at bedtime.   omeprazole 20 MG capsule Commonly known as: PRILOSEC Take 20 mg by mouth at bedtime. What changed: Another medication with the same name was removed. Continue taking this medication, and follow the directions you see here.   senna-docusate 8.6-50 MG tablet Commonly known as: Senokot-S Take 1 tablet by mouth at bedtime as needed for mild constipation.   sodium bicarbonate 650 MG tablet Take 1 tablet (650 mg total) by mouth 2 (two) times daily.   sodium chloride 0.65 % Soln nasal spray Commonly known as: OCEAN Place 1 spray into both nostrils as needed for congestion.   sodium zirconium cyclosilicate 10 g Pack packet Commonly known as: LOKELMA Take 10 g by mouth daily. Start taking on: January 05, 2020   tiZANidine 4 MG tablet Commonly known as: ZANAFLEX Take 4 mg by mouth every 8 (eight) hours as needed for muscle spasms.   Vitamin D (Ergocalciferol) 1.25 MG (50000 UNIT) Caps capsule Commonly known as: DRISDOL Take 50,000 Units by mouth once a week.        Disposition and follow-up:   Ann Sellers was discharged from [facility] in Stable condition.  At the hospital follow up visit please address:  1.  Aspiration pneumonia. No previous supplemental O2 requirement at home prior to admission--discharged on 2L. Please continue to reassess and wean as able.  2. Incidental Lung nodules and an adnexal nodule noted on imaging obtained during her hospitalization. Will need a follow up noncontrast CT in 3 mo for lung nodules. Pelvic US for follow up of adnexal lesion.  3. CKD stage 3b. Initially presented with superimposed AKI however resolved over her hospitalization. Nephrology was consulted in regards to her persistent hyperkalemia and worsened renal function on admission and she was placed on sodium bicarb 650mg  BID.  Hyperkalemia was persistent over her hospitalization. Started on daily lokelma. Please monitor K closely.  4. Hypertension. Remained on her home dose of 50mg  metoprolol throughout her hospitalization however blood pressures began to trend up towards the end of her stay. Added 10mg  amlodipine on day of discharge.   Follow-up Appointments: PCP 3-5d Contact information for after-discharge care     Destination     [facility] .   Service: Skilled Nursing Contact information: [place] [phone]                Hospital Course: Ann Sellers is a 86 yo female with CKD IV, paroxysmal atrial fibrillation(not anticoagulated) and T2DM who presented to [facility] ED on 12/26/19 for shortness of breath which began after an episode of emesis the day prior to admission. CXR findings were consistent with aspiration pneumonia. She was placed on 2L [place] however respiratory failure progressed resulting in a respiratory acidosis and she was placed on BiPAP. After about a day on BiPAP, her respiratory status improved  however respiratory failure did not entirely resolve by time of discharge. She was discharged on 2L [place]. She completed a 9d course of antibiotic treatment with unasyn. Throughout her hospitalization, she did not exhibit a leukocytosis. She did have a one day period where she was minimally responsive and a CT head was obtained and negative for acute findings.  Her hospitalization was complicated by a conversion into afib with RVR which resolved with 2 boluses of amiodarone. She remained on telemetry during her stay and no further events were recorded. Blood pressures began to trend up over her hospitalization. Prior to admission, she was on 50mg  metoprolol xl. Blood pressures reached the 341P-379K systolically and amlodipine was started on day of discharge.  Another complication of her hospitalization was acute on  chronic renal failure with associated hyperkalemia. Her baseline creatinine is ~2 which she returned to by time of discharge. Nephrology was consulted due to recurrent hyperkalemia and progressive renal failure and she was placed on sodium bicarb and lokelma. She did continue to have hyperkalemia for which lokelma was scheduled daily and will need to be continued at discharge. Creatinine 1.8 on day of discharge.  Additionally, on admission, an abdomen pelvis CT was obtained for the emesis that she had experienced prior to admission. Although negative for acute findings, it did reveal a few lung nodules as well as a right adnexal nodule which will need to be followed up outpatient.  Meggen previously lived independently at home however iADLs have slowly become more difficult due to her progressive macular degeneration. She was evaluated by PT during her hospitalization and was recommended for SNF placement.  Discharge Vitals:   BP 126/69 (BP Location: Left Arm)   Pulse 76   Temp 97.7 F (36.5 C)   Resp 16   Ht 5' (1.524 m)   Wt 84.8 kg   SpO2 100%   BMI 36.51 kg/m   Pertinent Labs, Studies, and Procedures:  3/7 CXR: retrocardiac opacity 3/11 CXR: LLL consolidation; small/trace bilateral effusions; cardiomegaly with pulmonary venous congestion--no frank pulm edema   3/7 CT a/p: LLL airspace opacity. RLL nodular density--infectious vs inflammatory--repeat noncontrast CT in 54mo. 4.3cm low attenuation lesion in right adnexal region, presumably ovarian cyst. furthe revaluation with nonemergent pelvic ultrasound recommended to r/o neoplasm.    3/10 head CT: no acute findings; sphenoidal mass slightly increased in size--compatible with growing meningioma  CBC Latest Ref Rng & Units 01/03/2020 01/02/2020 01/01/2020  WBC 4.0 - 10.5 K/uL 5.3 4.4 4.1  Hemoglobin 12.0 - 15.0 g/dL 8.5(L) 8.1(L) 8.1(L)  Hematocrit 36.0 - 46.0 % 28.9(L) 26.8(L) 26.7(L)  Platelets 150 - 400 K/uL 160 127(L) 138(L)   BMP  Latest Ref Rng & Units 01/04/2020 01/03/2020 01/03/2020  Glucose 70 - 99 mg/dL 147(H) 174(H) 137(H)  BUN 8 - 23 mg/dL 50(H) 49(H) 52(H)  Creatinine 0.44 - 1.00 mg/dL 1.80(H) 1.89(H) 1.92(H)  BUN/Creat Ratio 12 - 28 - - -  Sodium 135 - 145 mmol/L 145 143 145  Potassium 3.5 - 5.1 mmol/L 5.4(H) 4.7 5.3(H)  Chloride 98 - 111 mmol/L 104 107 108  CO2 22 - 32 mmol/L 32 32 30  Calcium 8.9 - 10.3 mg/dL 8.8(L) 8.3(L) 8.5(L)    Signed: Mitzi Hansen, MD 01/04/2020, 11:08 AM   Pager: [phone]

## 2020-01-03 NOTE — Clinical Social Work Note (Signed)
Patient and family have been given bed offers. CSW met with son Ann Sellers at the bedside and he states that he would like to look over list that this CSW provided. Ann Sellers states that he will look over the list and discuss it with his siblings prior to making a decision. Plan is for Ann Sellers to call CSW once they have made a decision.

## 2020-01-03 NOTE — Social Work (Signed)
CSW received telephone call from Kandace Parkins [phone], requesting chosen facility and patient's prior level of functioning. Requested a callback once the information is received.  Criss Alvine, CSW

## 2020-01-03 NOTE — Progress Notes (Addendum)
Call from tele about a 5 beat run of SVT. Pt is sleep in bed. Will monitor.

## 2020-01-03 NOTE — Plan of Care (Signed)

## 2020-01-03 NOTE — Progress Notes (Signed)
NAME:  Ann Sellers, MRN:  [id number], DOB:  Nov 09, 1924, LOS: 8 ADMISSION DATE:  12/26/2019  Subjective/Interm history  5 beat run of VT overnight.  SW note indicating patient is agreeable to SNF  Objective   Blood pressure (!) 148/53, pulse 79, temperature 98 F (36.7 C), temperature source Oral, resp. rate 20, height 5' (1.524 m), weight 84.8 kg, SpO2 100 %.     Intake/Output Summary (Last 24 hours) at 01/03/2020 0502 Last data filed at 01/02/2020 2100 Gross per 24 hour  Intake 580 ml  Output 200 ml  Net 380 ml   Filed Weights   12/26/19 1000 12/28/19 0500 12/30/19 0500  Weight: 81.1 kg 85.9 kg 84.8 kg     Examination: GENERAL: in no acute distress, overall appearance significantly improved from Friday CARDIAC: heart RRR. Systolic murmur present. PULMONARY:  2L [place]. Breathing comfortably. Skin: senile purpura throughout NEURO: alert and oriented. Moving all extremities.  Significant Diagnostic Tests:  3/7 CXR: retrocardiac opacity 3/11 CXR: LLL consolidation; small/trace bilateral effusions; cardiomegaly with pulmonary venous congestion--no frank pulm edema  3/7 CT a/p: LLL airspace opacity. RLL nodular density--infectious vs inflammatory--repeat noncontrast CT in 42mo. 4.3cm low attenuation lesion in right adnexal region, presumably ovarian cyst. furthe revaluation with nonemergent pelvic ultrasound recommended to r/o neoplasm.   3/10 head CT: no acute findings; sphenoidal mass slightly increased in size--compatible with growing meningioma  Micro Data:  3/7 blood culture>NGTD 3/9 UC>>NGTD  Antimicrobials:  Azithromycin 3/7>>3/8 Rocephin 3/7>>3/8 Unasyn 3/8>>3/14  Labs    CBC Latest Ref Rng & Units 01/03/2020 01/02/2020 01/01/2020  WBC 4.0 - 10.5 K/uL 5.3 4.4 4.1  Hemoglobin 12.0 - 15.0 g/dL 8.5(L) 8.1(L) 8.1(L)  Hematocrit 36.0 - 46.0 % 28.9(L) 26.8(L) 26.7(L)  Platelets 150 - 400 K/uL 160 127(L) 138(L)   BMP Latest Ref Rng & Units 01/03/2020 01/02/2020 01/01/2020   Glucose 70 - 99 mg/dL 137(H) 133(H) 118(H)  BUN 8 - 23 mg/dL 52(H) 54(H) 51(H)  Creatinine 0.44 - 1.00 mg/dL 1.92(H) 1.98(H) 2.05(H)  BUN/Creat Ratio 12 - 28 - - -  Sodium 135 - 145 mmol/L 145 142 144  Potassium 3.5 - 5.1 mmol/L 5.3(H) 5.2(H) 4.5  Chloride 98 - 111 mmol/L 108 106 106  CO2 22 - 32 mmol/L 30 27 27   Calcium 8.9 - 10.3 mg/dL 8.5(L) 8.1(L) 8.1(L)    Summary  86 yo female presenting with 1d history of shortness of breath and fever who was found to have aspiration pneumonia.   Assessment & Plan:  Principal Problem:   Pneumonia Active Problems:   Hypertension   Chronic kidney disease (CKD), stage IV (severe) (HCC)   Chronic diastolic heart failure (HCC)   Pressure injury of skin   Acute respiratory failure (HCC)  Acute hypercapnic respiratory failure due to Aspiration pneumonia. Overall appears to have improved over the weekend. Down to [facility]. Completed 7d course of treatment with unasyn. AMS. Resolved Plan Wean O2 as able.  Paroxysmal atrial fibrillation not on anticoagulation due to history of bleeding. Follows with Dr. Meda Coffee in cardiology.  Chronic HFpEF with severe AS.  Hypertension stable. Plan:  Metoprolol 50mg  bid Continuous tele monitoring ; Daily weights. Strict I/O  AKI on CKD IV. Resolved. Baseline Cr ~2.  Hyperkalemia. Continues to have elevated K. Concerning given her run of SVT overnight, Plan: will increase lokelma to every other day. sodium bicarb 650mg  bid--started 3/9, phoslyra tid-started 3/9,  low potassium diet Will continue to monitor with labs  Suspected Gout. Continue allopurinol 50mg  every  other day.   T2DM. Glucoses appropriate. No pharmaceutical management required at this time.  RESOLVED Afib with RVR requiring 2 amiodarone boluses and 5mg  metoprolol. Converted back to SR. Suspect the conversion into afib RVR due to infection  Best practice:  CODE STATUS: DNR Diet: cardiac; dysphagia 1; NPO while on BiPAP DVT for prophylaxis:  lovenox Dispo: stable for discharge pending SNF bed placement. Will need follow up for lung nodules and adnexal findings on CT after discharge.   Mitzi Hansen, MD INTERNAL MEDICINE RESIDENT PGY-1 PAGER #: [phone] 01/03/20  5:02 AM

## 2020-01-04 DIAGNOSIS — I959 Hypotension, unspecified: Secondary | ICD-10-CM | POA: Diagnosis not present

## 2020-01-04 DIAGNOSIS — R4182 Altered mental status, unspecified: Secondary | ICD-10-CM | POA: Diagnosis not present

## 2020-01-04 DIAGNOSIS — R0602 Shortness of breath: Secondary | ICD-10-CM | POA: Diagnosis not present

## 2020-01-04 DIAGNOSIS — E875 Hyperkalemia: Secondary | ICD-10-CM | POA: Diagnosis not present

## 2020-01-04 DIAGNOSIS — Z7189 Other specified counseling: Secondary | ICD-10-CM | POA: Diagnosis not present

## 2020-01-04 DIAGNOSIS — R2681 Unsteadiness on feet: Secondary | ICD-10-CM | POA: Diagnosis not present

## 2020-01-04 DIAGNOSIS — Z03818 Encounter for observation for suspected exposure to other biological agents ruled out: Secondary | ICD-10-CM | POA: Diagnosis not present

## 2020-01-04 DIAGNOSIS — J690 Pneumonitis due to inhalation of food and vomit: Secondary | ICD-10-CM | POA: Diagnosis not present

## 2020-01-04 DIAGNOSIS — Z888 Allergy status to other drugs, medicaments and biological substances status: Secondary | ICD-10-CM

## 2020-01-04 DIAGNOSIS — Z87891 Personal history of nicotine dependence: Secondary | ICD-10-CM | POA: Diagnosis not present

## 2020-01-04 DIAGNOSIS — J9601 Acute respiratory failure with hypoxia: Secondary | ICD-10-CM | POA: Diagnosis not present

## 2020-01-04 DIAGNOSIS — Z79899 Other long term (current) drug therapy: Secondary | ICD-10-CM | POA: Diagnosis not present

## 2020-01-04 DIAGNOSIS — R918 Other nonspecific abnormal finding of lung field: Secondary | ICD-10-CM | POA: Diagnosis not present

## 2020-01-04 DIAGNOSIS — E1169 Type 2 diabetes mellitus with other specified complication: Secondary | ICD-10-CM | POA: Diagnosis not present

## 2020-01-04 DIAGNOSIS — J9602 Acute respiratory failure with hypercapnia: Secondary | ICD-10-CM | POA: Diagnosis not present

## 2020-01-04 DIAGNOSIS — I10 Essential (primary) hypertension: Secondary | ICD-10-CM | POA: Diagnosis not present

## 2020-01-04 DIAGNOSIS — R2689 Other abnormalities of gait and mobility: Secondary | ICD-10-CM | POA: Diagnosis not present

## 2020-01-04 DIAGNOSIS — I350 Nonrheumatic aortic (valve) stenosis: Secondary | ICD-10-CM | POA: Diagnosis not present

## 2020-01-04 DIAGNOSIS — G9349 Other encephalopathy: Secondary | ICD-10-CM | POA: Diagnosis not present

## 2020-01-04 DIAGNOSIS — I5032 Chronic diastolic (congestive) heart failure: Secondary | ICD-10-CM | POA: Diagnosis not present

## 2020-01-04 DIAGNOSIS — Z7401 Bed confinement status: Secondary | ICD-10-CM | POA: Diagnosis not present

## 2020-01-04 DIAGNOSIS — E1122 Type 2 diabetes mellitus with diabetic chronic kidney disease: Secondary | ICD-10-CM | POA: Diagnosis not present

## 2020-01-04 DIAGNOSIS — M2550 Pain in unspecified joint: Secondary | ICD-10-CM | POA: Diagnosis not present

## 2020-01-04 DIAGNOSIS — G9341 Metabolic encephalopathy: Secondary | ICD-10-CM | POA: Diagnosis not present

## 2020-01-04 DIAGNOSIS — J90 Pleural effusion, not elsewhere classified: Secondary | ICD-10-CM | POA: Diagnosis not present

## 2020-01-04 DIAGNOSIS — E870 Hyperosmolality and hypernatremia: Secondary | ICD-10-CM | POA: Diagnosis not present

## 2020-01-04 DIAGNOSIS — N179 Acute kidney failure, unspecified: Secondary | ICD-10-CM

## 2020-01-04 DIAGNOSIS — Z8249 Family history of ischemic heart disease and other diseases of the circulatory system: Secondary | ICD-10-CM | POA: Diagnosis not present

## 2020-01-04 DIAGNOSIS — Z833 Family history of diabetes mellitus: Secondary | ICD-10-CM | POA: Diagnosis not present

## 2020-01-04 DIAGNOSIS — M109 Gout, unspecified: Secondary | ICD-10-CM | POA: Diagnosis not present

## 2020-01-04 DIAGNOSIS — I4510 Unspecified right bundle-branch block: Secondary | ICD-10-CM | POA: Diagnosis not present

## 2020-01-04 DIAGNOSIS — J45901 Unspecified asthma with (acute) exacerbation: Secondary | ICD-10-CM | POA: Diagnosis not present

## 2020-01-04 DIAGNOSIS — M6281 Muscle weakness (generalized): Secondary | ICD-10-CM | POA: Diagnosis not present

## 2020-01-04 DIAGNOSIS — Z66 Do not resuscitate: Secondary | ICD-10-CM | POA: Diagnosis not present

## 2020-01-04 DIAGNOSIS — Z96652 Presence of left artificial knee joint: Secondary | ICD-10-CM | POA: Diagnosis present

## 2020-01-04 DIAGNOSIS — E1165 Type 2 diabetes mellitus with hyperglycemia: Secondary | ICD-10-CM | POA: Diagnosis not present

## 2020-01-04 DIAGNOSIS — I130 Hypertensive heart and chronic kidney disease with heart failure and stage 1 through stage 4 chronic kidney disease, or unspecified chronic kidney disease: Secondary | ICD-10-CM | POA: Diagnosis not present

## 2020-01-04 DIAGNOSIS — Z20822 Contact with and (suspected) exposure to covid-19: Secondary | ICD-10-CM | POA: Diagnosis not present

## 2020-01-04 DIAGNOSIS — Z7982 Long term (current) use of aspirin: Secondary | ICD-10-CM | POA: Diagnosis not present

## 2020-01-04 DIAGNOSIS — Z883 Allergy status to other anti-infective agents status: Secondary | ICD-10-CM

## 2020-01-04 DIAGNOSIS — M1990 Unspecified osteoarthritis, unspecified site: Secondary | ICD-10-CM | POA: Diagnosis present

## 2020-01-04 DIAGNOSIS — F0390 Unspecified dementia without behavioral disturbance: Secondary | ICD-10-CM | POA: Diagnosis not present

## 2020-01-04 DIAGNOSIS — N184 Chronic kidney disease, stage 4 (severe): Secondary | ICD-10-CM | POA: Diagnosis not present

## 2020-01-04 DIAGNOSIS — J9621 Acute and chronic respiratory failure with hypoxia: Secondary | ICD-10-CM | POA: Diagnosis not present

## 2020-01-04 DIAGNOSIS — J9622 Acute and chronic respiratory failure with hypercapnia: Secondary | ICD-10-CM | POA: Diagnosis not present

## 2020-01-04 DIAGNOSIS — N1832 Chronic kidney disease, stage 3b: Secondary | ICD-10-CM

## 2020-01-04 DIAGNOSIS — N183 Chronic kidney disease, stage 3 unspecified: Secondary | ICD-10-CM | POA: Diagnosis not present

## 2020-01-04 DIAGNOSIS — I480 Paroxysmal atrial fibrillation: Secondary | ICD-10-CM | POA: Diagnosis not present

## 2020-01-04 DIAGNOSIS — E874 Mixed disorder of acid-base balance: Secondary | ICD-10-CM | POA: Diagnosis not present

## 2020-01-04 DIAGNOSIS — Z515 Encounter for palliative care: Secondary | ICD-10-CM | POA: Diagnosis not present

## 2020-01-04 DIAGNOSIS — J189 Pneumonia, unspecified organism: Secondary | ICD-10-CM | POA: Diagnosis not present

## 2020-01-04 DIAGNOSIS — R531 Weakness: Secondary | ICD-10-CM | POA: Diagnosis not present

## 2020-01-04 DIAGNOSIS — E279 Disorder of adrenal gland, unspecified: Secondary | ICD-10-CM

## 2020-01-04 DIAGNOSIS — G9340 Encephalopathy, unspecified: Secondary | ICD-10-CM | POA: Diagnosis not present

## 2020-01-04 DIAGNOSIS — I4891 Unspecified atrial fibrillation: Secondary | ICD-10-CM | POA: Diagnosis not present

## 2020-01-04 LAB — RENAL FUNCTION PANEL
Albumin: 2.5 g/dL — ABNORMAL LOW (ref 3.5–5.0)
Anion gap: 9 (ref 5–15)
BUN: 50 mg/dL — ABNORMAL HIGH (ref 8–23)
CO2: 32 mmol/L (ref 22–32)
Calcium: 8.8 mg/dL — ABNORMAL LOW (ref 8.9–10.3)
Chloride: 104 mmol/L (ref 98–111)
Creatinine, Ser: 1.8 mg/dL — ABNORMAL HIGH (ref 0.44–1.00)
GFR calc Af Amer: 28 mL/min — ABNORMAL LOW (ref >60)
GFR calc non Af Amer: 24 mL/min — ABNORMAL LOW (ref >60)
Glucose, Bld: 147 mg/dL — ABNORMAL HIGH (ref 70–99)
Phosphorus: 4.9 mg/dL — ABNORMAL HIGH (ref 2.5–4.6)
Potassium: 5.4 mmol/L — ABNORMAL HIGH (ref 3.5–5.1)
Sodium: 145 mmol/L (ref 135–145)

## 2020-01-04 LAB — GLUCOSE, CAPILLARY
Glucose-Capillary: 106 mg/dL — ABNORMAL HIGH (ref 70–99)
Glucose-Capillary: 135 mg/dL — ABNORMAL HIGH (ref 70–99)

## 2020-01-04 MED ORDER — ALLOPURINOL 100 MG PO TABS: 50.0000 mg | ORAL_TABLET | ORAL | Status: AC

## 2020-01-04 MED ORDER — AMLODIPINE BESYLATE 5 MG PO TABS: 5.0000 mg | ORAL_TABLET | Freq: Every day | ORAL | Status: AC

## 2020-01-04 MED ORDER — SODIUM ZIRCONIUM CYCLOSILICATE 10 G PO PACK
10.0000 g | PACK | Freq: Every day | ORAL | Status: DC
  Administered 2020-01-04: 10 g via ORAL
  Filled 2020-01-04: qty 1

## 2020-01-04 MED ORDER — GABAPENTIN 600 MG PO TABS: 300.0000 mg | ORAL_TABLET | Freq: Two times a day (BID) | ORAL | Status: AC

## 2020-01-04 MED ORDER — LORATADINE 10 MG PO TABS: 10.0000 mg | ORAL_TABLET | Freq: Every day | ORAL | 1 refills | Status: AC

## 2020-01-04 MED ORDER — CALCITRIOL 1 MCG/ML PO SOLN: 0.2500 ug | Freq: Every day | ORAL | 12 refills | Status: DC

## 2020-01-04 MED ORDER — AMLODIPINE BESYLATE 5 MG PO TABS
5.0000 mg | ORAL_TABLET | Freq: Every day | ORAL | Status: DC
  Administered 2020-01-04: 5 mg via ORAL
  Filled 2020-01-04: qty 1

## 2020-01-04 MED ORDER — SALINE SPRAY 0.65 % NA SOLN: 1.0000 | NASAL | 0 refills | Status: AC | PRN

## 2020-01-04 MED ORDER — SODIUM ZIRCONIUM CYCLOSILICATE 10 G PO PACK: 10.0000 g | PACK | Freq: Every day | ORAL | Status: DC

## 2020-01-04 MED ORDER — SODIUM BICARBONATE 650 MG PO TABS: 650.0000 mg | ORAL_TABLET | Freq: Two times a day (BID) | ORAL | Status: DC

## 2020-01-04 MED ORDER — ASPIRIN 81 MG PO CHEW: 81.0000 mg | CHEWABLE_TABLET | Freq: Every day | ORAL | Status: AC

## 2020-01-04 MED ORDER — SENNOSIDES-DOCUSATE SODIUM 8.6-50 MG PO TABS: 1.0000 | ORAL_TABLET | Freq: Every evening | ORAL | Status: AC | PRN

## 2020-01-04 NOTE — TOC Transition Note (Signed)
Transition of Care [facility]) - CM/SW Discharge Note   Patient Details  Name: Ann Sellers MRN: [id number] Date of Birth: [birth year]  [facility] CM/SW Contact:  Atilano Median, LCSW Phone Number: 01/04/2020, 11:59 AM   Clinical Narrative:     Discharged to [facility]. Patient and family are aware and agreeable to this plan. Number to call report [phone] room [id number] given to unit RN Danielle. DC summary sent via Epic. No other needs at this time. Case closed to this CSW.   Final next level of care: Skilled Nursing Facility Barriers to Discharge: Barriers Resolved   Patient Goals and CMS Choice   CMS Medicare.gov Compare Post Acute Care list provided to:: Patient Represenative (must comment)(Jimmy) Choice offered to / list presented to : Adult Children  Discharge Placement   Existing PASRR number confirmed : 01/04/20          Patient chooses bed at: [facility] Patient to be transferred to facility by: [place] Name of family member notified: Jimmy Patient and family notified of of transfer: 01/04/20  Discharge Plan and Services                                     Social Determinants of Health (SDOH) Interventions     Readmission Risk Interventions No flowsheet data found.

## 2020-01-04 NOTE — Progress Notes (Signed)
Physical Therapy Treatment Patient Details Name: Ann Sellers MRN: [id number] DOB: 1925/03/23 Today's Date: 01/04/2020    History of Present Illness Pt is a 86 year old woman admitted on 12/26/18 with progressive SOB secondary to PNA. PMH: CHF, anemia, asthma, B12 deficiency, CKD IV, DM, HTN, moderate aortic stenosis, afib, R rotator cuff tear, HOH, vision impairment.    PT Comments    Pt asleep in bed on entry, easily roused and agreeable to get up to recliner for breakfast. With moving back covers pt found to be incontinent of stool. Pt requires min A for rolling for pericare. Pt then requires minA and maximal multimodal cuing for hand placement and LE management to come to EoB. Pt is min Ax2 for transfers and ambulation of 2 feet with RW Pt breakfast on tray beside her bed on entry and pt unaware due to her visual impairment. Pt set up for breakfast and RN informed of need for setup with meals in future. D/c plans remain appropriate. PT will continue to follow acutely.   Follow Up Recommendations  SNF     Equipment Recommendations  Wheelchair (measurements PT);Wheelchair cushion (measurements PT);3in1 (PT)       Precautions / Restrictions Precautions Precautions: Fall Precaution Comments: Pt with low vision. Restrictions Weight Bearing Restrictions: No    Mobility  Bed Mobility Overal bed mobility: Needs Assistance Bed Mobility: Sidelying to Sit;Rolling     Supine to sit: Min assist     General bed mobility comments: minA for guidance of hands to rails and management of LE to assist in rolling for pericare. After cleaned up pt requires min A for management of hands to bed rails and LE management off the bed with HoH elevated and increased time pt able to bring trunk to upright with min guard  Transfers Overall transfer level: Needs assistance Equipment used: Rolling walker (2 wheeled) Transfers: Sit to/from [facility] Sit to Stand: Min assist;+2 physical  assistance         General transfer comment: min Ax2 for steadying after power up to RW, able to steady once CoG established over BoS  Ambulation/Gait Ambulation/Gait assistance: Min assist;+2 physical assistance Gait Distance (Feet): 2 Feet Assistive device: Rolling walker (2 wheeled) Gait Pattern/deviations: Step-to pattern;Decreased step length - right;Decreased step length - left;Shuffle Gait velocity: slowed Gait velocity interpretation: <1.31 ft/sec, indicative of household ambulator General Gait Details: minAx2 for steading with stepping from bed to recliner, max multimodal cuing due to hearing/vision deficits.   Stairs             Wheelchair Mobility    Modified Rankin (Stroke Patients Only)       Balance Overall balance assessment: Needs assistance Sitting-balance support: Single extremity supported;Feet supported Sitting balance-Leahy Scale: Poor Sitting balance - Comments: Initially reliant on BUE support but able to progress to unilateral. Supervision for safety   Standing balance support: Bilateral upper extremity supported;During functional activity Standing balance-Leahy Scale: Poor                              Cognition Arousal/Alertness: Awake/alert Behavior During Therapy: WFL for tasks assessed/performed Overall Cognitive Status: History of cognitive impairments - at baseline Area of Impairment: Problem solving                           Awareness: Anticipatory Problem Solving: Slow processing;Decreased initiation;Difficulty sequencing;Requires verbal cues;Requires tactile cues General Comments:  Pt cognition is greatly improved, continues to require increased cuing however more likely due to low vision/HOH.       Exercises      General Comments General comments (skin integrity, edema, etc.): VSS on 3L O2 via [place]       Pertinent Vitals/Pain Pain Assessment: Faces Faces Pain Scale: Hurts a little bit Pain Location:  chronic shoulder/back pain Pain Descriptors / Indicators: Aching Pain Intervention(s): Limited activity within patient's tolerance;Monitored during session;Repositioned           PT Goals (current goals can now be found in the care plan section) Acute Rehab PT Goals Patient Stated Goal: to eat PT Goal Formulation: With patient Time For Goal Achievement: 01/10/20 Potential to Achieve Goals: Fair Progress towards PT goals: Progressing toward goals    Frequency    Min 2X/week      PT Plan Current plan remains appropriate    Co-evaluation              AM-PAC PT "6 Clicks" Mobility   Outcome Measure  Help needed turning from your back to your side while in a flat bed without using bedrails?: A Little Help needed moving from lying on your back to sitting on the side of a flat bed without using bedrails?: A Lot Help needed moving to and from a bed to a chair (including a wheelchair)?: A Lot Help needed standing up from a chair using your arms (e.g., wheelchair or bedside chair)?: A Lot Help needed to walk in hospital room?: Total Help needed climbing 3-5 steps with a railing? : Total 6 Click Score: 11    End of Session Equipment Utilized During Treatment: Gait belt;Oxygen Activity Tolerance: Other (comment)(limited by poor vision and hearing ) Patient left: in chair;with call bell/phone within reach;with chair alarm set Nurse Communication: Mobility status PT Visit Diagnosis: Unsteadiness on feet (R26.81);Other abnormalities of gait and mobility (R26.89);Muscle weakness (generalized) (M62.81);History of falling (Z91.81);Difficulty in walking, not elsewhere classified (R26.2)     Time: 6761-9509 PT Time Calculation (min) (ACUTE ONLY): 40 min  Charges:  $Gait Training: 8-22 mins $Therapeutic Activity: 23-37 mins                     Vernal Rutan B. Migdalia Dk PT, DPT Acute Rehabilitation Services Pager [phone] Office [phone]    [place] 01/04/2020, 1:32 PM

## 2020-01-04 NOTE — Progress Notes (Addendum)
NAME:  ARFA LAMARCA, MRN:  [id number], DOB:  Jul 21, 1925, LOS: 9 ADMISSION DATE:  12/26/2019  Subjective/Interm history  Bed available at [facility].  Objective   Blood pressure (!) 177/64, pulse 74, temperature 98.5 F (36.9 C), temperature source Oral, resp. rate 20, height 5' (1.524 m), weight 84.8 kg, SpO2 94 %.     Intake/Output Summary (Last 24 hours) at 01/04/2020 0510 Last data filed at 01/03/2020 2100 Gross per 24 hour  Intake 600 ml  Output 1700 ml  Net -1100 ml   Filed Weights   12/26/19 1000 12/28/19 0500 12/30/19 0500  Weight: 81.1 kg 85.9 kg 84.8 kg     Examination: GENERAL: in no acute distress CARDIAC: heart RRR.  PULMONARY:  Breathing comfortably on [place]. Acyanotic. Skin: senile purpura throughout  Significant Diagnostic Tests:  3/7 CXR: retrocardiac opacity 3/11 CXR: LLL consolidation; small/trace bilateral effusions; cardiomegaly with pulmonary venous congestion--no frank pulm edema  3/7 CT a/p: LLL airspace opacity. RLL nodular density--infectious vs inflammatory--repeat noncontrast CT in 83mo. 4.3cm low attenuation lesion in right adnexal region, presumably ovarian cyst. furthe revaluation with nonemergent pelvic ultrasound recommended to r/o neoplasm.   Micro Data:  3/7 blood culture>NG 3/9 UC>>NG  Antimicrobials:  Azithromycin 3/7>>3/8 Rocephin 3/7>>3/8 Unasyn 3/8>>3/14  Labs    CBC Latest Ref Rng & Units 01/03/2020 01/02/2020 01/01/2020  WBC 4.0 - 10.5 K/uL 5.3 4.4 4.1  Hemoglobin 12.0 - 15.0 g/dL 8.5(L) 8.1(L) 8.1(L)  Hematocrit 36.0 - 46.0 % 28.9(L) 26.8(L) 26.7(L)  Platelets 150 - 400 K/uL 160 127(L) 138(L)   BMP Latest Ref Rng & Units 01/04/2020 01/03/2020 01/03/2020  Glucose 70 - 99 mg/dL 147(H) 174(H) 137(H)  BUN 8 - 23 mg/dL 50(H) 49(H) 52(H)  Creatinine 0.44 - 1.00 mg/dL 1.80(H) 1.89(H) 1.92(H)  BUN/Creat Ratio 12 - 28 - - -  Sodium 135 - 145 mmol/L 145 143 145  Potassium 3.5 - 5.1 mmol/L 5.4(H) 4.7 5.3(H)  Chloride 98 - 111 mmol/L 104  107 108  CO2 22 - 32 mmol/L 32 32 30  Calcium 8.9 - 10.3 mg/dL 8.8(L) 8.3(L) 8.5(L)    Summary  86 yo female presenting with 1d history of shortness of breath and fever who was found to have aspiration pneumonia.   Assessment & Plan:  Principal Problem:   Pneumonia Active Problems:   Hypertension   Chronic kidney disease (CKD), stage IV (severe) (HCC)   Chronic diastolic heart failure (HCC)   PAF (paroxysmal atrial fibrillation) (HCC)   Pressure injury of skin   Acute respiratory failure (HCC)   Acute encephalopathy  Acute hypercapnic respiratory failure due to Aspiration pneumonia. Overall appears to have improved over the weekend. Down to [facility]. Completed 7d course of treatment with unasyn. Plan Wean O2 as able.  Paroxysmal atrial fibrillation not on anticoagulation due to history of bleeding. Follows with Dr. Meda Coffee in cardiology.  Chronic HFpEF with severe AS.  Hypertension. Blood pressures elevated last evening through this morning. Plan:  Metoprolol 50mg  bid. Will add amlodipine 5mg  daily. Continuous tele monitoring ; Daily weights. Strict I/O  AKI on CKD IV. Resolved. Baseline Cr ~2.  Hyperkalemia. Continues to have elevated K--5.4 this morning Plan: lokelma every day, sodium bicarb 650mg  bid--started 3/9, phoslyra tid-started 3/9,  low potassium diet   Suspected Gout. Continue allopurinol 50mg  every other day.   T2DM. Stable. No pharmaceutical management required at this time.  Best practice:  CODE STATUS: DNR Diet: cardiac; dysphagia 1; NPO while on BiPAP DVT for prophylaxis: lovenox Dispo: stable  for discharge to [facility] today. Will need follow up for lung nodules and adnexal findings on CT after discharge.   Mitzi Hansen, MD INTERNAL MEDICINE RESIDENT PGY-1 PAGER #: [phone] 01/04/20  5:10 AM

## 2020-01-05 DIAGNOSIS — I480 Paroxysmal atrial fibrillation: Secondary | ICD-10-CM | POA: Diagnosis not present

## 2020-01-05 DIAGNOSIS — J690 Pneumonitis due to inhalation of food and vomit: Secondary | ICD-10-CM | POA: Diagnosis not present

## 2020-01-05 DIAGNOSIS — N183 Chronic kidney disease, stage 3 unspecified: Secondary | ICD-10-CM | POA: Diagnosis not present

## 2020-01-05 DIAGNOSIS — E1165 Type 2 diabetes mellitus with hyperglycemia: Secondary | ICD-10-CM | POA: Diagnosis not present

## 2020-01-06 DIAGNOSIS — E875 Hyperkalemia: Secondary | ICD-10-CM | POA: Diagnosis not present

## 2020-01-06 DIAGNOSIS — G9349 Other encephalopathy: Secondary | ICD-10-CM | POA: Diagnosis not present

## 2020-01-06 DIAGNOSIS — I480 Paroxysmal atrial fibrillation: Secondary | ICD-10-CM | POA: Diagnosis not present

## 2020-01-06 DIAGNOSIS — I130 Hypertensive heart and chronic kidney disease with heart failure and stage 1 through stage 4 chronic kidney disease, or unspecified chronic kidney disease: Secondary | ICD-10-CM | POA: Diagnosis not present

## 2020-01-07 ENCOUNTER — Emergency Department (HOSPITAL_COMMUNITY): Payer: Medicare Other

## 2020-01-07 ENCOUNTER — Inpatient Hospital Stay (HOSPITAL_COMMUNITY)
Admission: EM | Admit: 2020-01-07 | Discharge: 2020-01-17 | DRG: 189 | Disposition: A | Discharge status: Skilled Nursing Facility | Payer: Medicare Other | Attending: Internal Medicine | Admitting: Internal Medicine

## 2020-01-07 DIAGNOSIS — R5381 Other malaise: Secondary | ICD-10-CM

## 2020-01-07 DIAGNOSIS — Z6836 Body mass index (BMI) 36.0-36.9, adult: Secondary | ICD-10-CM

## 2020-01-07 DIAGNOSIS — M1990 Unspecified osteoarthritis, unspecified site: Secondary | ICD-10-CM | POA: Diagnosis present

## 2020-01-07 DIAGNOSIS — J45909 Unspecified asthma, uncomplicated: Secondary | ICD-10-CM | POA: Diagnosis present

## 2020-01-07 DIAGNOSIS — N179 Acute kidney failure, unspecified: Secondary | ICD-10-CM | POA: Diagnosis not present

## 2020-01-07 DIAGNOSIS — J9622 Acute and chronic respiratory failure with hypercapnia: Secondary | ICD-10-CM | POA: Diagnosis present

## 2020-01-07 DIAGNOSIS — E870 Hyperosmolality and hypernatremia: Secondary | ICD-10-CM | POA: Diagnosis not present

## 2020-01-07 DIAGNOSIS — Z974 Presence of external hearing-aid: Secondary | ICD-10-CM

## 2020-01-07 DIAGNOSIS — R1310 Dysphagia, unspecified: Secondary | ICD-10-CM | POA: Diagnosis not present

## 2020-01-07 DIAGNOSIS — K580 Irritable bowel syndrome with diarrhea: Secondary | ICD-10-CM | POA: Diagnosis present

## 2020-01-07 DIAGNOSIS — J45901 Unspecified asthma with (acute) exacerbation: Secondary | ICD-10-CM | POA: Diagnosis not present

## 2020-01-07 DIAGNOSIS — J9621 Acute and chronic respiratory failure with hypoxia: Secondary | ICD-10-CM | POA: Diagnosis not present

## 2020-01-07 DIAGNOSIS — R4182 Altered mental status, unspecified: Secondary | ICD-10-CM | POA: Diagnosis not present

## 2020-01-07 DIAGNOSIS — Z79899 Other long term (current) drug therapy: Secondary | ICD-10-CM

## 2020-01-07 DIAGNOSIS — Z833 Family history of diabetes mellitus: Secondary | ICD-10-CM

## 2020-01-07 DIAGNOSIS — M109 Gout, unspecified: Secondary | ICD-10-CM | POA: Diagnosis not present

## 2020-01-07 DIAGNOSIS — Z8249 Family history of ischemic heart disease and other diseases of the circulatory system: Secondary | ICD-10-CM | POA: Diagnosis not present

## 2020-01-07 DIAGNOSIS — H547 Unspecified visual loss: Secondary | ICD-10-CM | POA: Diagnosis present

## 2020-01-07 DIAGNOSIS — E1122 Type 2 diabetes mellitus with diabetic chronic kidney disease: Secondary | ICD-10-CM | POA: Diagnosis present

## 2020-01-07 DIAGNOSIS — I480 Paroxysmal atrial fibrillation: Secondary | ICD-10-CM | POA: Diagnosis not present

## 2020-01-07 DIAGNOSIS — Z20822 Contact with and (suspected) exposure to covid-19: Secondary | ICD-10-CM | POA: Diagnosis not present

## 2020-01-07 DIAGNOSIS — R52 Pain, unspecified: Secondary | ICD-10-CM | POA: Diagnosis not present

## 2020-01-07 DIAGNOSIS — Z888 Allergy status to other drugs, medicaments and biological substances status: Secondary | ICD-10-CM

## 2020-01-07 DIAGNOSIS — J90 Pleural effusion, not elsewhere classified: Secondary | ICD-10-CM | POA: Diagnosis not present

## 2020-01-07 DIAGNOSIS — R531 Weakness: Secondary | ICD-10-CM | POA: Diagnosis not present

## 2020-01-07 DIAGNOSIS — I10 Essential (primary) hypertension: Secondary | ICD-10-CM | POA: Diagnosis present

## 2020-01-07 DIAGNOSIS — Z87891 Personal history of nicotine dependence: Secondary | ICD-10-CM | POA: Diagnosis not present

## 2020-01-07 DIAGNOSIS — I350 Nonrheumatic aortic (valve) stenosis: Secondary | ICD-10-CM | POA: Diagnosis not present

## 2020-01-07 DIAGNOSIS — Z7951 Long term (current) use of inhaled steroids: Secondary | ICD-10-CM

## 2020-01-07 DIAGNOSIS — N390 Urinary tract infection, site not specified: Secondary | ICD-10-CM | POA: Diagnosis not present

## 2020-01-07 DIAGNOSIS — Z66 Do not resuscitate: Secondary | ICD-10-CM | POA: Diagnosis not present

## 2020-01-07 DIAGNOSIS — E874 Mixed disorder of acid-base balance: Secondary | ICD-10-CM | POA: Diagnosis not present

## 2020-01-07 DIAGNOSIS — Z96652 Presence of left artificial knee joint: Secondary | ICD-10-CM | POA: Diagnosis present

## 2020-01-07 DIAGNOSIS — M2550 Pain in unspecified joint: Secondary | ICD-10-CM | POA: Diagnosis not present

## 2020-01-07 DIAGNOSIS — Z7189 Other specified counseling: Secondary | ICD-10-CM | POA: Diagnosis not present

## 2020-01-07 DIAGNOSIS — J9602 Acute respiratory failure with hypercapnia: Secondary | ICD-10-CM | POA: Diagnosis not present

## 2020-01-07 DIAGNOSIS — E875 Hyperkalemia: Secondary | ICD-10-CM | POA: Diagnosis present

## 2020-01-07 DIAGNOSIS — I4510 Unspecified right bundle-branch block: Secondary | ICD-10-CM | POA: Diagnosis present

## 2020-01-07 DIAGNOSIS — Z7982 Long term (current) use of aspirin: Secondary | ICD-10-CM | POA: Diagnosis not present

## 2020-01-07 DIAGNOSIS — F0390 Unspecified dementia without behavioral disturbance: Secondary | ICD-10-CM | POA: Diagnosis not present

## 2020-01-07 DIAGNOSIS — J9600 Acute respiratory failure, unspecified whether with hypoxia or hypercapnia: Secondary | ICD-10-CM | POA: Diagnosis present

## 2020-01-07 DIAGNOSIS — G9341 Metabolic encephalopathy: Secondary | ICD-10-CM | POA: Diagnosis not present

## 2020-01-07 DIAGNOSIS — I959 Hypotension, unspecified: Secondary | ICD-10-CM | POA: Diagnosis not present

## 2020-01-07 DIAGNOSIS — M6281 Muscle weakness (generalized): Secondary | ICD-10-CM | POA: Diagnosis not present

## 2020-01-07 DIAGNOSIS — I5032 Chronic diastolic (congestive) heart failure: Secondary | ICD-10-CM | POA: Diagnosis present

## 2020-01-07 DIAGNOSIS — H409 Unspecified glaucoma: Secondary | ICD-10-CM | POA: Diagnosis present

## 2020-01-07 DIAGNOSIS — N184 Chronic kidney disease, stage 4 (severe): Secondary | ICD-10-CM | POA: Diagnosis present

## 2020-01-07 DIAGNOSIS — Z7401 Bed confinement status: Secondary | ICD-10-CM | POA: Diagnosis not present

## 2020-01-07 DIAGNOSIS — I130 Hypertensive heart and chronic kidney disease with heart failure and stage 1 through stage 4 chronic kidney disease, or unspecified chronic kidney disease: Secondary | ICD-10-CM | POA: Diagnosis present

## 2020-01-07 DIAGNOSIS — H9190 Unspecified hearing loss, unspecified ear: Secondary | ICD-10-CM | POA: Diagnosis present

## 2020-01-07 DIAGNOSIS — N183 Chronic kidney disease, stage 3 unspecified: Secondary | ICD-10-CM | POA: Diagnosis not present

## 2020-01-07 DIAGNOSIS — R0602 Shortness of breath: Secondary | ICD-10-CM | POA: Diagnosis not present

## 2020-01-07 DIAGNOSIS — Z515 Encounter for palliative care: Secondary | ICD-10-CM | POA: Diagnosis not present

## 2020-01-07 DIAGNOSIS — Z9981 Dependence on supplemental oxygen: Secondary | ICD-10-CM

## 2020-01-07 DIAGNOSIS — F1021 Alcohol dependence, in remission: Secondary | ICD-10-CM | POA: Diagnosis present

## 2020-01-07 DIAGNOSIS — D649 Anemia, unspecified: Secondary | ICD-10-CM | POA: Diagnosis not present

## 2020-01-07 DIAGNOSIS — Z03818 Encounter for observation for suspected exposure to other biological agents ruled out: Secondary | ICD-10-CM | POA: Diagnosis not present

## 2020-01-07 DIAGNOSIS — K219 Gastro-esophageal reflux disease without esophagitis: Secondary | ICD-10-CM | POA: Diagnosis not present

## 2020-01-07 LAB — COMPREHENSIVE METABOLIC PANEL
ALT: 13 U/L (ref 0–44)
AST: 14 U/L — ABNORMAL LOW (ref 15–41)
Albumin: 3.2 g/dL — ABNORMAL LOW (ref 3.5–5.0)
Alkaline Phosphatase: 63 U/L (ref 38–126)
Anion gap: 13 (ref 5–15)
BUN: 55 mg/dL — ABNORMAL HIGH (ref 8–23)
CO2: 32 mmol/L (ref 22–32)
Calcium: 9.6 mg/dL (ref 8.9–10.3)
Chloride: 101 mmol/L (ref 98–111)
Creatinine, Ser: 1.94 mg/dL — ABNORMAL HIGH (ref 0.44–1.00)
GFR calc Af Amer: 25 mL/min — ABNORMAL LOW (ref >60)
GFR calc non Af Amer: 22 mL/min — ABNORMAL LOW (ref >60)
Glucose, Bld: 108 mg/dL — ABNORMAL HIGH (ref 70–99)
Potassium: 6.5 mmol/L (ref 3.5–5.1)
Sodium: 146 mmol/L — ABNORMAL HIGH (ref 135–145)
Total Bilirubin: 0.8 mg/dL (ref 0.3–1.2)
Total Protein: 5.8 g/dL — ABNORMAL LOW (ref 6.5–8.1)

## 2020-01-07 LAB — POCT I-STAT EG7
Acid-Base Excess: 13 mmol/L — ABNORMAL HIGH (ref 0.0–2.0)
Bicarbonate: 41.5 mmol/L — ABNORMAL HIGH (ref 20.0–28.0)
Calcium, Ion: 1.23 mmol/L (ref 1.15–1.40)
HCT: 32 % — ABNORMAL LOW (ref 36.0–46.0)
Hemoglobin: 10.9 g/dL — ABNORMAL LOW (ref 12.0–15.0)
O2 Saturation: 90 %
Potassium: 6 mmol/L — ABNORMAL HIGH (ref 3.5–5.1)
Sodium: 143 mmol/L (ref 135–145)
TCO2: 44 mmol/L — ABNORMAL HIGH (ref 22–32)
pCO2, Ven: 73.2 mmHg (ref 44.0–60.0)
pH, Ven: 7.362 (ref 7.250–7.430)
pO2, Ven: 65 mmHg — ABNORMAL HIGH (ref 32.0–45.0)

## 2020-01-07 LAB — AMMONIA: Ammonia: 22 umol/L (ref 9–35)

## 2020-01-07 LAB — CBC
HCT: 36.3 % (ref 36.0–46.0)
Hemoglobin: 10.3 g/dL — ABNORMAL LOW (ref 12.0–15.0)
MCH: 30.7 pg (ref 26.0–34.0)
MCHC: 28.4 g/dL — ABNORMAL LOW (ref 30.0–36.0)
MCV: 108.4 fL — ABNORMAL HIGH (ref 80.0–100.0)
Platelets: UNDETERMINED 10*3/uL (ref 150–400)
RBC: 3.35 MIL/uL — ABNORMAL LOW (ref 3.87–5.11)
RDW: 14.1 % (ref 11.5–15.5)
WBC: 5.6 10*3/uL (ref 4.0–10.5)
nRBC: 0 % (ref 0.0–0.2)

## 2020-01-07 LAB — TSH: TSH: 1.619 u[IU]/mL (ref 0.350–4.500)

## 2020-01-07 LAB — CBG MONITORING, ED
Glucose-Capillary: 101 mg/dL — ABNORMAL HIGH (ref 70–99)
Glucose-Capillary: 118 mg/dL — ABNORMAL HIGH (ref 70–99)

## 2020-01-07 MED ORDER — PROMETHAZINE HCL 25 MG PO TABS: 12.5000 mg | ORAL_TABLET | Freq: Four times a day (QID) | ORAL | Status: DC | PRN

## 2020-01-07 MED ORDER — SENNOSIDES-DOCUSATE SODIUM 8.6-50 MG PO TABS: 1.0000 | ORAL_TABLET | Freq: Every evening | ORAL | Status: DC | PRN

## 2020-01-07 MED ORDER — HEPARIN SODIUM (PORCINE) 5000 UNIT/ML IJ SOLN
5000.0000 [IU] | Freq: Three times a day (TID) | INTRAMUSCULAR | Status: DC
  Administered 2020-01-08 – 2020-01-16 (×28): 5000 [IU] via SUBCUTANEOUS
  Filled 2020-01-07 (×28): qty 1

## 2020-01-07 MED ORDER — ALBUTEROL SULFATE (2.5 MG/3ML) 0.083% IN NEBU
10.0000 mg | INHALATION_SOLUTION | Freq: Once | RESPIRATORY_TRACT | Status: AC
  Administered 2020-01-07: 10 mg via RESPIRATORY_TRACT
  Filled 2020-01-07: qty 12

## 2020-01-07 MED ORDER — LACTATED RINGERS IV BOLUS
500.0000 mL | Freq: Once | INTRAVENOUS | Status: AC
  Administered 2020-01-07: 500 mL via INTRAVENOUS

## 2020-01-07 MED ORDER — ACETAMINOPHEN 650 MG RE SUPP
650.0000 mg | Freq: Four times a day (QID) | RECTAL | Status: DC | PRN
  Administered 2020-01-08: 650 mg via RECTAL
  Filled 2020-01-07: qty 1

## 2020-01-07 MED ORDER — ACETAMINOPHEN 325 MG PO TABS
650.0000 mg | ORAL_TABLET | Freq: Four times a day (QID) | ORAL | Status: DC | PRN
  Administered 2020-01-10 – 2020-01-16 (×9): 650 mg via ORAL
  Filled 2020-01-07 (×11): qty 2

## 2020-01-07 MED ORDER — INSULIN ASPART 100 UNIT/ML IV SOLN
5.0000 [IU] | Freq: Once | INTRAVENOUS | Status: AC
  Administered 2020-01-07: 5 [IU] via INTRAVENOUS

## 2020-01-07 MED ORDER — CALCIUM GLUCONATE-NACL 1-0.675 GM/50ML-% IV SOLN
1.0000 g | Freq: Once | INTRAVENOUS | Status: AC
  Administered 2020-01-07: 1000 mg via INTRAVENOUS
  Filled 2020-01-07: qty 50

## 2020-01-07 MED ORDER — DEXTROSE 50 % IV SOLN
1.0000 | Freq: Once | INTRAVENOUS | Status: AC
  Administered 2020-01-07: 50 mL via INTRAVENOUS
  Filled 2020-01-07: qty 50

## 2020-01-07 MED ORDER — SODIUM CHLORIDE 0.9 % IV SOLN: 1.0000 g | Freq: Once | INTRAVENOUS | Status: DC

## 2020-01-07 NOTE — H&P (Addendum)
Date: 01/07/2020               Patient Name:  Ann Sellers MRN: [id number]  DOB: 13-May-1925 Age / Sex: 86 y.o., female   PCP: Reynold Bowen, MD         Medical Service: Internal Medicine Teaching Service         Attending Physician: Dr. Lajean Saver, MD    First Contact: Dr. Darrick Meigs Pager: [phone]  Second Contact: Dr. Sharon Seller Pager: [phone]       After Hours (After 5p/  First Contact Pager: [phone]  weekends / holidays): Second Contact Pager: [phone]   Chief Complaint: Altered Mental Status  History of Present Illness:  Ann Sellers is a 86 y/o female, with a PMH of Asthma, DM, HTN, PAF, CKD, who presents to [facility] with Altered Mental Status. Ann Sellers is accompanied by her Grandaughter, Ann Sellers, who we spoke with as Ann Sellers was unable to participate in the interview. Per Ann Sellers, Ann Sellers was discharged from [facility] on 01/04/2020 after being admitted with aspiration pneumonia, and was discharged with home oxygen. Due to her still being weak from her hospital stay, Ann Sellers was accepted at [facility], for rehabilitation. The day of her discharge Ann Sellers's family states that her mentation was normal at baseline, she was able to recognize family members and engaged in conversation over [place]. Over the past 48 hours, her family has observed her become more lethargic. She began to forget her family member's names, and was sleeping more and more. Ann Sellers was ultimately brought in to the [facility] due to her lethargy and increase in the oxygen supply. She had no complaints prior to hospitalization.   During her ED course she was found to be hyperkalemic without EKG changes, acute on chronic AKI, and found to be hypercarbic. She was started on Insulin IM and D5W, calcium gluconate, and a 500 mL LR bolus.  History was obtained from chart review and interview with family at bedside.    Meds:  No outpatient medications have been  marked as taking for the 01/07/20 encounter [facility] Encounter).   Allergies:      Allergies as of 01/07/2020 - Review Complete 12/26/2019  Allergen Reaction Noted  . Losartan Cough 09/16/2016  . Snake antivenin [antivenin crotalidae polyvalent (snake antivenin)] Other (See Comments) 07/11/2011       Past Medical History:  Diagnosis Date  . Anemia   . Arthritis   . Asthma   . B12 deficiency   . Chronic diastolic CHF (congestive heart failure) ([place])   . CKD (chronic kidney disease), stage III   . Diabetes mellitus ([place])   . Hypertension   . Moderate aortic stenosis   . Neuromuscular disorder ([place])   . PAF (paroxysmal atrial fibrillation) ([place])   . Right rotator cuff tear   . Wears dentures    top denture-bottom partial  . Wears glasses   . Wears hearing aid    both ears    Family History:  Diabetes: Daughter, sister, son Cardiac:    Heart Disease: sister   Hypertension: Both sisters and son Cancer: Unable to obtain.   Social History:  Before admission on 12/26/2019, patient lived at home with her eldest son, Ann Sellers. She was able to complete her ADLs unassisted, and did not rely on cane/walkers, she was very active.  Tobacco Products: Previous smoker, quit 32 years ago ETOH: 7 drinks/week Illicit Drugs: Denies use  Review of Systems: A  complete ROS was negative except as per HPI.  Physical Exam: Blood pressure (!) 156/45, pulse 74, temperature 99 F (37.2 C), temperature source Oral, resp. rate (!) 34, SpO2 96 %. Physical Exam Vitals and nursing note reviewed.  Constitutional:      Appearance: She is obese.     Comments: Patient is somnolent, responds to loud verbal stimuli, responds to "Fluff."  HENT:     Head: Normocephalic and atraumatic.     Nose: Nose normal.  Eyes:     General:        Right eye: No discharge.        Left eye: No discharge.     Conjunctiva/sclera: Conjunctivae normal.     Pupils: Pupils are equal, round, and  reactive to light.  Cardiovascular:     Rate and Rhythm: Normal rate and regular rhythm.     Pulses: Normal pulses.     Heart sounds: Normal heart sounds. No murmur. No friction rub. No gallop.   Pulmonary:     Breath sounds: Wheezing present. No rhonchi or rales.     Comments: Tachypneic, RR 28 Wheezes appreciated in upper and lower lung fields bilaterally.  Abdominal:     General: Bowel sounds are normal.     Tenderness: There is abdominal tenderness. There is no rebound.     Comments: Grimacing elicited on deep palpation in the LLQ/LUQ  Musculoskeletal:     Right lower leg: No edema.     Left lower leg: No edema.     Comments: Moving both upper and lower extremities bilaterally.   Neurological:     Mental Status: She is disoriented.     Comments: Unable to participate during neuro examination.      EKG: personally reviewed my interpretation is Sinus rhythm with RBBB and L anterior Fascicular Block.  CXR: personally reviewed my interpretation is Cardiomegaly with vascular congestion and bilateral pleural effusion.   Assessment & Plan by Problem: Active Problems:   * No active hospital problems. *  Ann Sellers is a 86 y/o female, with a PMH of asthma, DM, HTN, PAF, CKD, who presents to [facility] with acute encephalopathy.  Patient presents with acute encephalopathy. Patient was found to have respiratory acidosis, which could be the underlying etiology for her presentation. On chart review patient was saturating in the 70s while at SNF, and required increased oxygen demand. While she is hypercarbic, she does show metabolic compensation as well, but her increased O2 may have been enough to disturb her baseline. May be secondary to vascular congestion on chest xray vs asthma exacerbation. She was treated on last admission for aspiration pneumonia and completed a 9 day course of Unasyn. Less likely a reemergence of her pneumonia, but could be possibly due other infectious etiologies  like a UTI. CT head (12/30/19) on last admission did not show signs of stroke, but a previous calcified lesion originating from the plane of sphenoidale was noted. Lastly, patient has been hospitalized and placed in SNF before   Acute Encephalopathy:  Likely secondary to metabolic change.  - TSH: 4.481 - Urinalysis pending - Ammonia: 22   Respiratory Acidosis with Metabolic Alkalosis:  Vbg primary acidosis with secondary metabolic alkalosis, with chest xray showing vascular congestion with bilateral pleural effusions. Possible asthma exacerbation vs pulmonary edema.  - Patient on BiPAP - BNP pending - Prednisone 40 mg QD - Proventil 2.5 mg nebulizations Q6H PRN - Dulera 2 puffs BID - Singulair 10 mg QD  Hyperkalemia:  Likely 2/2 to CKD. K: 6.5 on arrival.  - calcium gluconate 1g/14mL given - LR bolus 500 mL given -  Novolog 10U with D5W given - albuterol 10 mg Nebulized ordered  Hypertension:  Takes Norvasc 5 mg at home - Held at this time.   Chronic Diastolic CHF:  Echo 0/68/9340 with EF >65% - Furosemide 40 mg QD  Dispo: Admit patient to Inpatient with expected length of stay greater than 2 midnights.  Signed: Maudie Mercury, MD 01/07/2020, 10:09 PM

## 2020-01-07 NOTE — ED Provider Notes (Signed)
[facility] EMERGENCY DEPARTMENT Provider Note   CSN: [id number] Arrival date & time: 01/07/20  1805     History Chief Complaint  Patient presents with  . Altered Mental Status    Ann Sellers is a 86 y.o. female.  HPI Patient is a 86 year old female with a PMH of hearing impairment, glaucoma with vision impairment, hypertension, insulin-dependent diabetes, CKD, paroxysmal A. fib (not on anticoagulation) and a recent hospitalization due to respiratory failure 2/2 aspiration pneumonia presenting to the ED today due to altered mental status.  Patient has been at her current SNF since 3/16 when she was discharged home.  Per EMS, nursing home staff says that patient is at her baseline mental status.  They said that she was working with PT today and became hypoxic to the mid 70s on room air.  Patient was discharged on 2 L nasal cannula from the hospital.  When she was placed on nasal cannula, they did not see an immediate improvement in her oxygen saturation and had to place her on a facemask.  Patient was subsequently transferred to the ED for further evaluation and management.  On arrival, patient arousable to physical stimuli and not able to answer questions.  Patient arrives with a DNR/MOST form that says that she would not want CPR but would be amenable to intubation, antibiotics, IV fluids, etc.    Past Medical History:  Diagnosis Date  . Anemia   . Arthritis   . Asthma   . B12 deficiency   . Chronic diastolic CHF (congestive heart failure) ([place])   . CKD (chronic kidney disease), stage III   . Diabetes mellitus ([place])   . Hypertension   . Moderate aortic stenosis   . Neuromuscular disorder ([place])   . PAF (paroxysmal atrial fibrillation) ([place])   . Right rotator cuff tear   . Wears dentures    top denture-bottom partial  . Wears glasses   . Wears hearing aid    both ears    Patient Active Problem List   Diagnosis Date Noted  . Acute renal failure  superimposed on stage 3b chronic kidney disease ([place])   . Acute encephalopathy 01/03/2020  . Acute respiratory failure ([place])   . Pressure injury of skin 12/28/2019  . Pneumonia 12/26/2019  . PAF (paroxysmal atrial fibrillation) ([place]) 12/23/2016  . Aortic stenosis, moderate 09/16/2016  . Community acquired pneumonia 09/13/2016  . Lower extremity weakness 09/13/2016  . Chronic diastolic heart failure ([place]) 09/13/2016  . Severe persistent chronic asthma without complication 40/98/1191  . Morbid obesity ([place]) 07/06/2015  . Edema 07/22/2014  . Varicose veins of lower extremities with complications 47/82/9562  . Chronic kidney disease (CKD), stage IV (severe) ([place]) 02/01/2014  . Right rotator cuff tear   . Hypertension   . Arthritis   . Insulin dependent diabetes mellitus with complications   . Spinal stenosis   . Wears glasses   . Wears dentures   . Wears hearing aid   . Asthma 07/11/2011  . Vitamin D deficiency 07/11/2011  . Osteoarthritis 07/11/2011  . Cough 07/11/2011    Past Surgical History:  Procedure Laterality Date  . APPENDECTOMY  1945  . CATARACT EXTRACTION, BILATERAL  2008  . cervical stynosis  2005  . esophageal stretch  2001  . [place]  . herniated disc  1983  . left hand surgery  2011   gout  . left knee replacement  2002  . left knee surgery  1998  .  right wrist fracture  2006  . SHOULDER ARTHROSCOPY WITH ROTATOR CUFF REPAIR Right 01/05/2013   Procedure: SHOULDER ARTHROSCOPY WITH ROTATOR CUFF REPAIR subchromial decompression and distal clavical excision;  Surgeon: Lorn Junes, MD;  Location: [facility];  Service: Orthopedics;  Laterality: Right;  . tonsillectomy  1936  . TONSILLECTOMY    . TYMPANOSTOMY TUBE PLACEMENT  1989  . VESICOVAGINAL FISTULA CLOSURE W/ TAH  1968     OB History   No obstetric history on file.     Family History  Problem Relation Age of Onset  . Pneumonia Mother   . Diabetes Son   .  Hypertension Son   . Heart disease Sister   . Hypertension Sister   . Diabetes Sister   . Hypertension Sister   . Diabetes Daughter     Social History   Tobacco Use  . Smoking status: Former Smoker    Packs/day: 0.50    Years: 20.00    Pack years: 10.00    Types: Cigarettes    Quit date: 10/22/1987    Years since quitting: 32.2  . Smokeless tobacco: Never Used  Substance Use Topics  . Alcohol use: Yes    Alcohol/week: 7.0 standard drinks    Types: 7 Glasses of wine per week    Comment: social drinker  . Drug use: No    Home Medications Prior to Admission medications   Medication Sig Start Date End Date Taking? Authorizing Provider  albuterol (PROVENTIL HFA;VENTOLIN HFA) 108 (90 BASE) MCG/ACT inhaler Inhale 2 puffs into the lungs every 6 (six) hours as needed for wheezing or shortness of breath.     [provider]  albuterol (PROVENTIL) (2.5 MG/3ML) 0.083% nebulizer solution Take 2.5 mg by nebulization every 6 (six) hours as needed for wheezing or shortness of breath.    [provider]  allopurinol (ZYLOPRIM) 100 MG tablet Take 0.5 tablets (50 mg total) by mouth every other day. 01/04/20   Mitzi Hansen, MD  amLODipine (NORVASC) 5 MG tablet Take 1 tablet (5 mg total) by mouth daily. 01/05/20   Mitzi Hansen, MD  aspirin 81 MG chewable tablet Chew 1 tablet (81 mg total) by mouth daily. 01/04/20   Mitzi Hansen, MD  calcitRIOL (ROCALTROL) 0.25 MCG capsule Take 1 capsule by mouth daily. 06/28/15   [provider]  calcitRIOL (ROCALTROL) 1 MCG/ML solution Take 0.3 mLs (0.3 mcg total) by mouth daily. 01/04/20   Mitzi Hansen, MD  calcium acetate (PHOSLO) 667 MG capsule Take 1 capsule (667 mg total) by mouth 3 (three) times daily with meals. 11/17/19   Dorothy Spark, MD  diclofenac sodium (VOLTAREN) 1 % GEL Apply 2 g topically daily as needed (pain).    [provider]  diphenoxylate-atropine (LOMOTIL) 2.5-0.025 MG per tablet Take 1 tablet  by mouth daily as needed for diarrhea or loose stools.    [provider]  fluticasone (FLONASE) 50 MCG/ACT nasal spray Place 2 sprays into both nostrils daily. 07/08/17   [provider]  furosemide (LASIX) 40 MG tablet TAKE 1 TABLET BY MOUTH EVERY DAY Patient taking differently: Take 40 mg by mouth daily. TAKE 1 TABLET BY MOUTH EVERY DAY 04/08/19   Dorothy Spark, MD  gabapentin (NEURONTIN) 600 MG tablet Take 0.5 tablets (300 mg total) by mouth 2 (two) times daily. 01/04/20   Mitzi Hansen, MD  HYDROcodone-acetaminophen (NORCO/VICODIN) 5-325 MG per tablet Take 0.5-1 tablets by mouth every 6 (six) hours as needed for moderate pain.  06/17/15   [provider]  iron polysaccharides (NIFEREX) 150 MG capsule Take 150 mg by mouth daily.    [provider]  loratadine (CLARITIN) 10 MG tablet Take 1 tablet (10 mg total) by mouth daily. 01/04/20   Mitzi Hansen, MD  metoprolol succinate (TOPROL-XL) 50 MG 24 hr tablet TAKE 1 TABLET BY MOUTH EVERY DAY WITH FOOD OR IMMEDIATELY AFTER EATING Patient taking differently: Take 50 mg by mouth daily.  11/22/19   Dorothy Spark, MD  mometasone-formoterol [facility]) 100-5 MCG/ACT AERO Take 2 puffs first thing in am and then another 2 puffs about 12 hours later. 08/22/15   Tanda Rockers, MD  montelukast (SINGULAIR) 10 MG tablet Take 10 mg by mouth at bedtime.    [provider]  omeprazole (PRILOSEC) 20 MG capsule Take 20 mg by mouth at bedtime.  11/11/17   [provider]  senna-docusate (SENOKOT-S) 8.6-50 MG tablet Take 1 tablet by mouth at bedtime as needed for mild constipation. 01/04/20   Mitzi Hansen, MD  sodium bicarbonate 650 MG tablet Take 1 tablet (650 mg total) by mouth 2 (two) times daily. 01/04/20   Mitzi Hansen, MD  sodium chloride (OCEAN) 0.65 % SOLN nasal spray Place 1 spray into both nostrils as needed for congestion. 01/04/20   Mitzi Hansen, MD  sodium zirconium cyclosilicate  (LOKELMA) 10 g PACK packet Take 10 g by mouth daily. 01/05/20   Mitzi Hansen, MD  tiZANidine (ZANAFLEX) 4 MG tablet Take 4 mg by mouth every 8 (eight) hours as needed for muscle spasms.     [provider]  Vitamin D, Ergocalciferol, (DRISDOL) 50000 units CAPS capsule Take 50,000 Units by mouth once a week. 09/20/16   [provider]    Allergies    Losartan and Snake antivenin [antivenin crotalidae polyvalent (snake antivenin)]  Review of Systems   Review of Systems  Unable to perform ROS: Mental status change    Physical Exam Updated Vital Signs There were no vitals taken for this visit.  Physical Exam Vitals and nursing note reviewed.  Constitutional:      Appearance: She is well-developed. She is obese. She is ill-appearing (chronically).     Comments: Patient somnolent.  HENT:     Head: Normocephalic and atraumatic.     Right Ear: External ear normal.     Left Ear: External ear normal.     Nose: Nose normal. No congestion or rhinorrhea.     Mouth/Throat:     Mouth: Mucous membranes are moist.     Pharynx: Oropharynx is clear.  Eyes:     Extraocular Movements: Extraocular movements intact.     Pupils: Pupils are equal, round, and reactive to light.  Cardiovascular:     Rate and Rhythm: Normal rate and regular rhythm.     Pulses: Normal pulses.     Heart sounds: Normal heart sounds.  Pulmonary:     Effort: Pulmonary effort is normal. No respiratory distress.     Breath sounds: Normal breath sounds. No stridor. No wheezing, rhonchi or rales.  Abdominal:     General: There is no distension.     Palpations: Abdomen is soft.     Tenderness: There is no abdominal tenderness. There is no guarding or rebound.  Musculoskeletal:        General: Normal range of motion.     Cervical back: Normal range of motion and neck supple.     Right lower leg: No edema.  Left lower leg: No edema.  Skin:    General: Skin is warm and dry.     Capillary Refill:  Capillary refill takes less than 2 seconds.  Neurological:     Sensory: No sensory deficit.     Motor: Weakness (generalized) present.     Comments: Patient somnolent on exam.  She is arousable to physical stimulus and will say short phrases when aroused.  Patient not able to answer questions or follow commands.  Patient uncooperative for full neuro exam.     ED Results / Procedures / Treatments   Labs (all labs ordered are listed, but only abnormal results are displayed) Labs Reviewed  SARS CORONAVIRUS 2 (TAT 6-24 HRS)  CBC  COMPREHENSIVE METABOLIC PANEL  TSH  AMMONIA  URINALYSIS, ROUTINE W REFLEX MICROSCOPIC  RAPID URINE DRUG SCREEN, HOSP PERFORMED  CBG MONITORING, ED  I-STAT VENOUS BLOOD GAS, ED    EKG EKG Interpretation  Date/Time:  Friday January 07 2020 18:09:46 EDT Ventricular Rate:  74 PR Interval:    QRS Duration: 125 QT Interval:  365 QTC Calculation: 405 R Axis:   -72 Text Interpretation: Sinus arrhythmia RBBB and LAFB Left axis deviation Confirmed by Lajean Saver [phone]) on 01/07/2020 6:40:01 PM   Radiology No results found.  Procedures Procedures (including critical care time)  Medications Ordered in ED Medications - No data to display  ED Course  I have reviewed the triage vital signs and the nursing notes.  Pertinent labs & imaging results that were available during my care of the patient were reviewed by me and considered in my medical decision making (see chart for details).    MDM Rules/Calculators/A&P                     Patient is a 86 year old female with a PMH of hearing impairment, glaucoma with vision impairment, hypertension, insulin-dependent diabetes, CKD, paroxysmal A. fib (not on anticoagulation) and a recent hospitalization due to respiratory failure 2/2 aspiration pneumonia presenting to the ED today due to altered mental status.  On exam, patient is somnolent but arouses easily with physical stimuli and has generalized weakness.  BP  142/58, HR 67, RR 25, SPO2 99% on 4 L facemask.  Temp 77F.  On arrival, patient is somnolent and will open her eyes and say short phrases with physical stimuli.  Patient not able to answer questions and is not cooperative with exam.  Unsure if this is due to severe hearing impairment.  Nursing facility states that they changed the battery in her hearing aid and we have provided her hearing aid and she has no improvement in mental status.  Called patient's son, [name], who says that he does not feel as though this is his mom's baseline.  He reports that patient was seen within the past week sitting up, alert and carrying on a full conversation.  However, on FaceTime today she appeared "listless" and says that she could barely wake up.  He felt as though this was an acute decline.  Patient also transferred to the ED today due to hypoxia during her physical therapy appointment.  She has been maintaining her SPO2 in the high 90s on 2 L facemask.  EKG with sinus arrhythmia and RBBB consistent with previous studies.  No signs of overlying ischemia.  Chest x-ray shows "cardiomegaly with vascular congestion and bilateral pleural effusions.  Persistent retrocardiac consolidation is seen on patient's recent CT."  POC glucose 101.  VBG with pH 7.362, PCO2 73.2,  bicarb 41.5 reflective of chronic hypercarbia.  CBC with hemoglobin 10.3.  CMP with sodium 146, potassium 6.5 without hemolysis, BUN 55, creatinine 1.94 (mildly elevated from previous), total protein 5.8, albumin 3.2.  Patient's potassium shifted with albuterol, insulin and glucose and provided calcium gluconate for cardiac protection.  TSH and ammonia WNL.  Patient not able to provide a urine sample.  At this point, unclear of exact etiology of patient's altered mental status.  She could be gradually declining due to dementia.  Patient unable to provide urine for evaluation of UTI.  Lower suspicion for metabolic encephalopathy as her pH is normal and her  hypercarbia appears chronic.  At this time, patient admitted to internal medicine service.  For details on hospital course following admission, please refer to inpatient team's note.  Patient stable at time of admission.  Pt assessed and evaluated with Dr. Ashok Cordia.  Nadeen Landau, MD   Final Clinical Impression(s) / ED Diagnoses Final diagnoses:  Altered mental status, unspecified altered mental status type    Rx / DC Orders ED Discharge Orders    None       Nadeen Landau, MD 01/07/20 2322    Lajean Saver, MD 01/08/20 [phone]

## 2020-01-07 NOTE — ED Notes (Signed)
Care endorsed to [facility], RN

## 2020-01-07 NOTE — ED Triage Notes (Signed)
Pt BIB [facility] from [facility], pt altered on FT with family, baseline per staff. Previously hospitalized on 15th for aspiration pneumonia. Pt had rehab therapy today, sat's found to be in 70's after completing, EMS put pt on mask, O2 99 on 4L, this is pt baseline. Pt hard of hearing, blind, responsive to pain at this time, VS stable, hypertensive at 142/58. NAD noted.

## 2020-01-07 NOTE — ED Notes (Signed)
Dr Ashok Cordia informed of EG7 results

## 2020-01-07 NOTE — ED Notes (Signed)
Additional PIV initiated, 20 G to R hand. IV flushes with 10 cc NS without s/s of infiltration. Positive blood return noted. Secured with tape and tegaderm. BMP drawn, labeled with 2 pt identifiers, and sent to lab

## 2020-01-08 ENCOUNTER — Inpatient Hospital Stay (HOSPITAL_COMMUNITY): Payer: Medicare Other

## 2020-01-08 DIAGNOSIS — I350 Nonrheumatic aortic (valve) stenosis: Secondary | ICD-10-CM

## 2020-01-08 DIAGNOSIS — R4182 Altered mental status, unspecified: Secondary | ICD-10-CM | POA: Diagnosis present

## 2020-01-08 LAB — BASIC METABOLIC PANEL
Anion gap: 11 (ref 5–15)
Anion gap: 12 (ref 5–15)
Anion gap: 8 (ref 5–15)
Anion gap: 9 (ref 5–15)
BUN: 55 mg/dL — ABNORMAL HIGH (ref 8–23)
BUN: 56 mg/dL — ABNORMAL HIGH (ref 8–23)
BUN: 58 mg/dL — ABNORMAL HIGH (ref 8–23)
BUN: 59 mg/dL — ABNORMAL HIGH (ref 8–23)
CO2: 35 mmol/L — ABNORMAL HIGH (ref 22–32)
CO2: 36 mmol/L — ABNORMAL HIGH (ref 22–32)
CO2: 36 mmol/L — ABNORMAL HIGH (ref 22–32)
CO2: 37 mmol/L — ABNORMAL HIGH (ref 22–32)
Calcium: 9.2 mg/dL (ref 8.9–10.3)
Calcium: 9.3 mg/dL (ref 8.9–10.3)
Calcium: 9.5 mg/dL (ref 8.9–10.3)
Calcium: 9.6 mg/dL (ref 8.9–10.3)
Chloride: 100 mmol/L (ref 98–111)
Chloride: 100 mmol/L (ref 98–111)
Chloride: 103 mmol/L (ref 98–111)
Chloride: 99 mmol/L (ref 98–111)
Creatinine, Ser: 1.94 mg/dL — ABNORMAL HIGH (ref 0.44–1.00)
Creatinine, Ser: 2.08 mg/dL — ABNORMAL HIGH (ref 0.44–1.00)
Creatinine, Ser: 2.12 mg/dL — ABNORMAL HIGH (ref 0.44–1.00)
Creatinine, Ser: 2.12 mg/dL — ABNORMAL HIGH (ref 0.44–1.00)
GFR calc Af Amer: 23 mL/min — ABNORMAL LOW (ref >60)
GFR calc Af Amer: 23 mL/min — ABNORMAL LOW (ref >60)
GFR calc Af Amer: 23 mL/min — ABNORMAL LOW (ref >60)
GFR calc Af Amer: 25 mL/min — ABNORMAL LOW (ref >60)
GFR calc non Af Amer: 20 mL/min — ABNORMAL LOW (ref >60)
GFR calc non Af Amer: 20 mL/min — ABNORMAL LOW (ref >60)
GFR calc non Af Amer: 20 mL/min — ABNORMAL LOW (ref >60)
GFR calc non Af Amer: 22 mL/min — ABNORMAL LOW (ref >60)
Glucose, Bld: 105 mg/dL — ABNORMAL HIGH (ref 70–99)
Glucose, Bld: 118 mg/dL — ABNORMAL HIGH (ref 70–99)
Glucose, Bld: 122 mg/dL — ABNORMAL HIGH (ref 70–99)
Glucose, Bld: 156 mg/dL — ABNORMAL HIGH (ref 70–99)
Potassium: 5.4 mmol/L — ABNORMAL HIGH (ref 3.5–5.1)
Potassium: 5.6 mmol/L — ABNORMAL HIGH (ref 3.5–5.1)
Potassium: 5.8 mmol/L — ABNORMAL HIGH (ref 3.5–5.1)
Potassium: 6.1 mmol/L — ABNORMAL HIGH (ref 3.5–5.1)
Sodium: 146 mmol/L — ABNORMAL HIGH (ref 135–145)
Sodium: 146 mmol/L — ABNORMAL HIGH (ref 135–145)
Sodium: 147 mmol/L — ABNORMAL HIGH (ref 135–145)
Sodium: 147 mmol/L — ABNORMAL HIGH (ref 135–145)

## 2020-01-08 LAB — BLOOD GAS, ARTERIAL
Acid-Base Excess: 13.5 mmol/L — ABNORMAL HIGH (ref 0.0–2.0)
Acid-Base Excess: 12.5 mmol/L — ABNORMAL HIGH (ref 0.0–2.0)
Bicarbonate: 40 mmol/L — ABNORMAL HIGH (ref 20.0–28.0)
Bicarbonate: 37.6 mmol/L — ABNORMAL HIGH (ref 20.0–28.0)
Drawn by: 24686
FIO2: 44
FIO2: 45
O2 Saturation: 98.1 %
O2 Saturation: 96.8 %
Patient temperature: 37
Patient temperature: 37
pCO2 arterial: 81.3 mmHg (ref 32.0–48.0)
pCO2 arterial: 58.7 mmHg — ABNORMAL HIGH (ref 32.0–48.0)
pH, Arterial: 7.313 — ABNORMAL LOW (ref 7.350–7.450)
pH, Arterial: 7.423 (ref 7.350–7.450)
pO2, Arterial: 111 mmHg — ABNORMAL HIGH (ref 83.0–108.0)
pO2, Arterial: 90.3 mmHg (ref 83.0–108.0)

## 2020-01-08 LAB — CBC
HCT: 28.7 % — ABNORMAL LOW (ref 36.0–46.0)
Hemoglobin: 8.1 g/dL — ABNORMAL LOW (ref 12.0–15.0)
MCH: 30 pg (ref 26.0–34.0)
MCHC: 28.2 g/dL — ABNORMAL LOW (ref 30.0–36.0)
MCV: 106.3 fL — ABNORMAL HIGH (ref 80.0–100.0)
Platelets: 208 10*3/uL (ref 150–400)
RBC: 2.7 MIL/uL — ABNORMAL LOW (ref 3.87–5.11)
RDW: 14.1 % (ref 11.5–15.5)
WBC: 6.5 10*3/uL (ref 4.0–10.5)
nRBC: 0 % (ref 0.0–0.2)

## 2020-01-08 LAB — URINALYSIS, ROUTINE W REFLEX MICROSCOPIC
Bacteria, UA: NONE SEEN
Bilirubin Urine: NEGATIVE
Glucose, UA: NEGATIVE mg/dL
Hgb urine dipstick: NEGATIVE
Ketones, ur: NEGATIVE mg/dL
Nitrite: NEGATIVE
Protein, ur: NEGATIVE mg/dL
Specific Gravity, Urine: 1.01 (ref 1.005–1.030)
pH: 5 (ref 5.0–8.0)

## 2020-01-08 LAB — BRAIN NATRIURETIC PEPTIDE: B Natriuretic Peptide: 915.8 pg/mL — ABNORMAL HIGH (ref 0.0–100.0)

## 2020-01-08 LAB — SARS CORONAVIRUS 2 (TAT 6-24 HRS): SARS Coronavirus 2: NEGATIVE

## 2020-01-08 LAB — ECHOCARDIOGRAM COMPLETE

## 2020-01-08 LAB — GLUCOSE, CAPILLARY
Glucose-Capillary: 93 mg/dL (ref 70–99)
Glucose-Capillary: 144 mg/dL — ABNORMAL HIGH (ref 70–99)
Glucose-Capillary: 119 mg/dL — ABNORMAL HIGH (ref 70–99)

## 2020-01-08 MED ORDER — PANTOPRAZOLE SODIUM 40 MG PO TBEC
40.0000 mg | DELAYED_RELEASE_TABLET | Freq: Every day | ORAL | Status: DC
  Administered 2020-01-09 – 2020-01-17 (×9): 40 mg via ORAL
  Filled 2020-01-08 (×10): qty 1

## 2020-01-08 MED ORDER — ALBUTEROL SULFATE (2.5 MG/3ML) 0.083% IN NEBU: 2.5000 mg | INHALATION_SOLUTION | Freq: Four times a day (QID) | RESPIRATORY_TRACT | Status: DC | PRN

## 2020-01-08 MED ORDER — ALLOPURINOL 100 MG PO TABS
50.0000 mg | ORAL_TABLET | ORAL | Status: DC
  Administered 2020-01-10 – 2020-01-16 (×4): 50 mg via ORAL
  Filled 2020-01-08 (×5): qty 1

## 2020-01-08 MED ORDER — SODIUM ZIRCONIUM CYCLOSILICATE 10 G PO PACK
10.0000 g | PACK | Freq: Every day | ORAL | Status: DC
  Filled 2020-01-08: qty 1

## 2020-01-08 MED ORDER — DEXTROSE 50 % IV SOLN
1.0000 | Freq: Once | INTRAVENOUS | Status: AC
  Administered 2020-01-08: 50 mL via INTRAVENOUS
  Filled 2020-01-08: qty 50

## 2020-01-08 MED ORDER — ALBUTEROL SULFATE (2.5 MG/3ML) 0.083% IN NEBU: 10.0000 mg | INHALATION_SOLUTION | Freq: Once | RESPIRATORY_TRACT | Status: DC

## 2020-01-08 MED ORDER — CHLORHEXIDINE GLUCONATE CLOTH 2 % EX PADS
6.0000 | MEDICATED_PAD | Freq: Every day | CUTANEOUS | Status: DC
  Administered 2020-01-08 – 2020-01-17 (×10): 6 via TOPICAL

## 2020-01-08 MED ORDER — CALCIUM ACETATE (PHOS BINDER) 667 MG PO CAPS
667.0000 mg | ORAL_CAPSULE | Freq: Three times a day (TID) | ORAL | Status: DC
  Administered 2020-01-09 – 2020-01-17 (×26): 667 mg via ORAL
  Filled 2020-01-08 (×26): qty 1

## 2020-01-08 MED ORDER — MOMETASONE FURO-FORMOTEROL FUM 100-5 MCG/ACT IN AERO
2.0000 | INHALATION_SPRAY | Freq: Two times a day (BID) | RESPIRATORY_TRACT | Status: DC
  Administered 2020-01-08 – 2020-01-11 (×5): 2 via RESPIRATORY_TRACT
  Filled 2020-01-08: qty 8.8

## 2020-01-08 MED ORDER — CALCIUM GLUCONATE-NACL 1-0.675 GM/50ML-% IV SOLN
1.0000 g | Freq: Once | INTRAVENOUS | Status: AC
  Administered 2020-01-08: 1000 mg via INTRAVENOUS
  Filled 2020-01-08: qty 50

## 2020-01-08 MED ORDER — ASPIRIN 81 MG PO CHEW
81.0000 mg | CHEWABLE_TABLET | Freq: Every day | ORAL | Status: DC
  Administered 2020-01-09 – 2020-01-17 (×9): 81 mg via ORAL
  Filled 2020-01-08 (×10): qty 1

## 2020-01-08 MED ORDER — METHYLPREDNISOLONE SODIUM SUCC 40 MG IJ SOLR
40.0000 mg | Freq: Every day | INTRAMUSCULAR | Status: DC
  Administered 2020-01-08 – 2020-01-09 (×2): 40 mg via INTRAVENOUS
  Filled 2020-01-08 (×2): qty 1

## 2020-01-08 MED ORDER — LIP MEDEX EX OINT
TOPICAL_OINTMENT | CUTANEOUS | Status: DC | PRN
  Filled 2020-01-08: qty 7

## 2020-01-08 MED ORDER — CALCITRIOL 0.25 MCG PO CAPS
0.2500 ug | ORAL_CAPSULE | Freq: Every day | ORAL | Status: DC
  Administered 2020-01-09 – 2020-01-17 (×9): 0.25 ug via ORAL
  Filled 2020-01-08 (×11): qty 1

## 2020-01-08 MED ORDER — MONTELUKAST SODIUM 10 MG PO TABS
10.0000 mg | ORAL_TABLET | Freq: Every day | ORAL | Status: DC
  Administered 2020-01-09 – 2020-01-16 (×8): 10 mg via ORAL
  Filled 2020-01-08 (×8): qty 1

## 2020-01-08 MED ORDER — FUROSEMIDE 10 MG/ML IJ SOLN
40.0000 mg | Freq: Once | INTRAMUSCULAR | Status: AC
  Administered 2020-01-08: 40 mg via INTRAVENOUS
  Filled 2020-01-08: qty 4

## 2020-01-08 MED ORDER — PREDNISONE 20 MG PO TABS
40.0000 mg | ORAL_TABLET | Freq: Every day | ORAL | Status: DC
  Filled 2020-01-08: qty 2

## 2020-01-08 MED ORDER — INSULIN ASPART 100 UNIT/ML ~~LOC~~ SOLN
0.0000 [IU] | Freq: Three times a day (TID) | SUBCUTANEOUS | Status: DC
  Administered 2020-01-08: 1 [IU] via SUBCUTANEOUS
  Administered 2020-01-10: 4 [IU] via SUBCUTANEOUS
  Administered 2020-01-11: 2 [IU] via SUBCUTANEOUS

## 2020-01-08 MED ORDER — INSULIN ASPART 100 UNIT/ML IV SOLN
10.0000 [IU] | Freq: Once | INTRAVENOUS | Status: AC
  Administered 2020-01-08: 10 [IU] via INTRAVENOUS

## 2020-01-08 MED ORDER — ALBUTEROL SULFATE HFA 108 (90 BASE) MCG/ACT IN AERS: 2.0000 | INHALATION_SPRAY | Freq: Four times a day (QID) | RESPIRATORY_TRACT | Status: DC | PRN

## 2020-01-08 MED ORDER — SODIUM ZIRCONIUM CYCLOSILICATE 10 G PO PACK: 10.0000 g | PACK | Freq: Every day | ORAL | Status: DC

## 2020-01-08 MED ORDER — POLYSACCHARIDE IRON COMPLEX 150 MG PO CAPS
150.0000 mg | ORAL_CAPSULE | Freq: Every day | ORAL | Status: DC
  Administered 2020-01-09 – 2020-01-17 (×9): 150 mg via ORAL
  Filled 2020-01-08 (×11): qty 1

## 2020-01-08 MED ORDER — FUROSEMIDE 40 MG PO TABS: 40.0000 mg | ORAL_TABLET | Freq: Every day | ORAL | Status: DC

## 2020-01-08 MED ORDER — ALBUTEROL SULFATE (2.5 MG/3ML) 0.083% IN NEBU
10.0000 mg | INHALATION_SOLUTION | Freq: Once | RESPIRATORY_TRACT | Status: AC
  Administered 2020-01-08: 10 mg via RESPIRATORY_TRACT
  Filled 2020-01-08: qty 12

## 2020-01-08 NOTE — Progress Notes (Signed)
Placed patient on BiPAP per order 

## 2020-01-08 NOTE — ED Notes (Signed)
Pt was bladder scanned. 629ml shown. MD made aware.

## 2020-01-08 NOTE — ED Notes (Signed)
DNR bracelet placed on pt right wrist.

## 2020-01-08 NOTE — Progress Notes (Signed)
  Echocardiogram 2D Echocardiogram has been performed.  Jennette Dubin 01/08/2020, 9:50 AM

## 2020-01-08 NOTE — Progress Notes (Signed)
SLP Cancellation Note  Patient Details Name: Ann Sellers MRN: [id number] DOB: 1925/08/11   Cancelled treatment:        Received order for swallow assessment. RT in room placing pt on Bipap. Will continue efforts.    Houston Siren 01/08/2020, 11:15 AM  [phone]

## 2020-01-08 NOTE — Evaluation (Addendum)
Physical Therapy Evaluation Patient Details Name: Ann Sellers MRN: [id number] DOB: 10-11-1925 Today's Date: 01/08/2020   History of Present Illness  Pt is a 86 y/o female, with a PMH of asthma, glaucoma, DM, HTN, PAF, CKD, who presents to [facility] with AMS. Grandaughter reports pt was discharged from [facility] on 3/16 after aspiration pneumonia and was d/ced to SNF for rehab on 2L of oxygen. Pt's family reports pt was at baseline (able to recognize family and talk over facetime), however she began to become lethargic and started forgetting family member's names.     Clinical Impression  Pt is on 6L of O2 and resting in bed. Vitals WNL. RN in room and reports pt is hypercapnic thus will likely be very lethargic and confused. Pt unable to be aroused with verbal nor tactile cuing, pt keeps her eyes closed throughout majority of session. Pt unable to follow commands. Pt came from a SNF in which she was receiving rehab services due to recent hospitalization. Prior to that, pt was independent living at home with her son and used a rollator to ambulate. Pt required total A x 2 for all bed mobility and when trying to transfer to EOB pt was significantly pushing back and leaning laterally to the R and posteriorly. Pt unable to sit EOB without max A at this time. Will continue to assess functional mobility during future therapy sessions with hope that confusion and lethargy decreases. Pt was able to sit EOB approximately a week ago and stand with therapy during previous hospitalization approximately a week ago. D/C plans for SNF. PT will continue to follow pt acutely.     Follow Up Recommendations SNF;Supervision/Assistance - 24 hour    Equipment Recommendations  Other (comment)(none at this time, defer)       Precautions / Restrictions Precautions Precautions: Fall Precaution Comments: Pt with low vision. Restrictions Weight Bearing Restrictions: No      Mobility  Bed Mobility Overal bed mobility:  Needs Assistance Bed Mobility: Rolling;Supine to Sit;Sit to Supine Rolling: Total assist;+2 for physical assistance   Supine to sit: Total assist;+2 for physical assistance;HOB elevated Sit to supine: Total assist;+2 for physical assistance;HOB elevated   General bed mobility comments: Pt pushes back during supine<>sit transfer thus difficulty maintaing sitting EOB  Transfers                 General transfer comment: Unable at this time due to lethargy/ confusion, pt pushing back during bed mobility likely due to increased confusion  Ambulation/Gait             General Gait Details: unable at this time      Balance Overall balance assessment: Needs assistance Sitting-balance support: Bilateral upper extremity supported Sitting balance-Leahy Scale: Zero Sitting balance - Comments: Pt unable to fully sit EOB due to posterior pushing, pt requires max A to maintain sitting EOB for short interval Postural control: Right lateral lean;Posterior lean     Standing balance comment: unable                             Pertinent Vitals/Pain Pain Assessment: Faces Faces Pain Scale: Hurts little more Pain Location: unable to be determined, yells out "oh, oh my leg" in one instance during functional mobility however seems to be surprised/ confused with abrupt movements Pain Intervention(s): Limited activity within patient's tolerance;Monitored during session;Repositioned    Home Living Family/patient expects to be discharged to:: Private residence Living  Arrangements: Children Available Help at Discharge: Family;Available PRN/intermittently Type of Home: House Home Access: Stairs to enter Entrance Stairs-Rails: Can reach both Entrance Stairs-Number of Steps: 3 Home Layout: One level Home Equipment: Walker - 4 wheels;Grab bars - tub/shower;Hand held shower head;Other (comment);Adaptive equipment(O2) Additional Comments: Pt previously reported on 3/8 eval- "I don't  want a shower seat. I have plenty of rods.:    Prior Function Level of Independence: Independent with assistive device(s)         Comments: Before recent SNF stay after hospitalization, pt able to walk into Dr's office with Rollator, reports independence in self care and works together with son on housekeeping and meal prep      Hand Dominance   Dominant Hand: Right    Extremity/Trunk Assessment   Upper Extremity Assessment Upper Extremity Assessment: Defer to OT evaluation    Lower Extremity Assessment Lower Extremity Assessment: Generalized weakness;Difficult to assess due to impaired cognition       Communication   Communication: HOH;Expressive difficulties;Other (comment)(Nonverbal today other than expressing pain)  Cognition Arousal/Alertness: Lethargic Behavior During Therapy: Flat affect Overall Cognitive Status: Impaired/Different from baseline Area of Impairment: Orientation;Attention;Memory;Following commands;Safety/judgement;Awareness;Problem solving                 Orientation Level: Disoriented to;Person;Place;Time;Situation Current Attention Level: Alternating Memory: Decreased short-term memory Following Commands: Follows one step commands inconsistently Safety/Judgement: Decreased awareness of safety;Decreased awareness of deficits Awareness: Anticipatory Problem Solving: Slow processing;Decreased initiation;Difficulty sequencing;Requires verbal cues;Requires tactile cues General Comments: Pt's cognition different than baseline- she is nonverbal other than expressing pain and shouting "oh, oh" with abrupt movements. Pt is very lethargic; RN reports pt is hypercapnic thus lethargic and confused.      General Comments General comments (skin integrity, edema, etc.): Pt on 6L of O2. Resting SpO2 is 98%. After functional mobility, SpO2 is 94%.     PT Assessment Patient needs continued PT services  PT Problem List Decreased strength;Decreased activity  tolerance;Decreased balance;Decreased mobility;Decreased cognition;Decreased safety awareness;Decreased knowledge of use of DME;Cardiopulmonary status limiting activity;Decreased coordination       PT Treatment Interventions DME instruction;Gait training;Functional mobility training;Therapeutic activities;Therapeutic exercise;Balance training;Cognitive remediation;Neuromuscular re-education;Patient/family education;Wheelchair mobility training    PT Goals (Current goals can be found in the Care Plan section)  Acute Rehab PT Goals Patient Stated Goal: none stated PT Goal Formulation: Patient unable to participate in goal setting Time For Goal Achievement: 01/22/20 Potential to Achieve Goals: Fair    Frequency Min 2X/week        Co-evaluation PT/OT/SLP Co-Evaluation/Treatment: Yes Reason for Co-Treatment: Complexity of the patient's impairments (multi-system involvement);For patient/therapist safety;To address functional/ADL transfers PT goals addressed during session: Mobility/safety with mobility         AM-PAC PT "6 Clicks" Mobility  Outcome Measure Help needed turning from your back to your side while in a flat bed without using bedrails?: Total Help needed moving from lying on your back to sitting on the side of a flat bed without using bedrails?: Total Help needed moving to and from a bed to a chair (including a wheelchair)?: Total Help needed standing up from a chair using your arms (e.g., wheelchair or bedside chair)?: Total Help needed to walk in hospital room?: Total Help needed climbing 3-5 steps with a railing? : Total 6 Click Score: 6    End of Session Equipment Utilized During Treatment: Oxygen Activity Tolerance: Patient limited by lethargy;Other (comment)(limited due to confusion) Patient left: in bed;with call bell/phone within reach;with bed alarm set;with nursing/sitter  in room Nurse Communication: Mobility status PT Visit Diagnosis: Unsteadiness on feet  (R26.81);Other abnormalities of gait and mobility (R26.89);Muscle weakness (generalized) (M62.81);History of falling (Z91.81);Difficulty in walking, not elsewhere classified (R26.2)    Time: 2548-6282 PT Time Calculation (min) (ACUTE ONLY): 22 min   Charges:   PT Evaluation $PT Eval Moderate Complexity: 1 Mod          Jodelle Green, PT, DPT Acute Rehabilitation Services Office [phone]   Jodelle Green 01/08/2020, 11:23 AM

## 2020-01-08 NOTE — Progress Notes (Signed)
  Date: 01/08/2020  Patient name: Ann Sellers  Medical record number: [id number]  Date of birth: [birth year]        I have seen and evaluated this patient and I have discussed the plan of care with the house staff. Please see Dr. Chase Picket note for complete details. I concur with her findings and plan.   Sid Falcon, MD 01/08/2020, 9:38 PM

## 2020-01-08 NOTE — Progress Notes (Signed)
Occupational Therapy Evaluation Patient Details Name: Ann Sellers MRN: [id number] DOB: Oct 01, 1925 Today's Date: 01/08/2020    History of Present Illness Pt is a 86 y/o female, with a PMH of asthma, glaucoma, DM, HTN, PAF, CKD, who presents to [facility] with AMS. Grandaughter reports pt was discharged from [facility] on 3/16 after aspiration pneumonia and was d/ced to SNF for rehab on 2L of oxygen. Pt's family reports pt was at baseline (able to recognize family and talk over facetime), however she began to become lethargic and started forgetting family member's names.    Clinical Impression   Patient recently hospitalized for pneumonia and discharged to SNF where cognition declined, per chart.  Prior to last hospitalization she was mobile with rollator and independent.   Today patient supine in bed on arrival with RN in room.  Patient on 6L O2 and hypercapnic.  She was difficult to arouse, and once eyes opened patient was still not responsive.  She would move limbs occasionally but not on request.   Required total assist x2 to sit EOB but patient was pushing posteriorly.  Required total assist x2 with rolling.  Will continue to follow with OT and monitor cognitive improvements, as well as work on strength and arousal for increased independence.    Follow Up Recommendations  SNF;Supervision/Assistance - 24 hour    Equipment Recommendations  Other (comment)(defer to next venue)    Recommendations for Other Services       Precautions / Restrictions Precautions Precautions: Fall Precaution Comments: Pt with low vision. Restrictions Weight Bearing Restrictions: No      Mobility Bed Mobility Overal bed mobility: Needs Assistance Bed Mobility: Rolling;Supine to Sit;Sit to Supine Rolling: Total assist;+2 for physical assistance   Supine to sit: Total assist;+2 for physical assistance;HOB elevated Sit to supine: Total assist;+2 for physical assistance;HOB elevated   General bed mobility  comments: Pt pushes back during supine<>sit transfer thus difficulty maintaing sitting EOB  Transfers                 General transfer comment: Unable at this time due to lethargy/ confusion, pt pushing back during bed mobility likely due to increased confusion    Balance Overall balance assessment: Needs assistance Sitting-balance support: Bilateral upper extremity supported Sitting balance-Leahy Scale: Zero Sitting balance - Comments: Pt unable to fully sit EOB due to posterior pushing, pt requires max A to maintain sitting EOB for short interval Postural control: Right lateral lean;Posterior lean     Standing balance comment: unable                           ADL either performed or assessed with clinical judgement   ADL Overall ADL's : Needs assistance/impaired                                     Functional mobility during ADLs: Total assistance;+2 for physical assistance General ADL Comments: Today patient is total assist for all mobility and ADLs.       Vision         Perception     Praxis      Pertinent Vitals/Pain Pain Assessment: Faces Faces Pain Scale: Hurts little more Pain Location: unable to be determined, yells out "oh, oh my leg" in one instance during functional mobility however seems to be surprised/ confused with abrupt movements Pain Intervention(s): Limited activity within  patient's tolerance;Monitored during session;Repositioned     Hand Dominance Right   Extremity/Trunk Assessment Upper Extremity Assessment Upper Extremity Assessment: Generalized weakness   Lower Extremity Assessment Lower Extremity Assessment: Generalized weakness;Difficult to assess due to impaired cognition       Communication Communication Communication: HOH;Expressive difficulties;Other (comment)(Non verbal - responsive only to pain)   Cognition Arousal/Alertness: Lethargic Behavior During Therapy: Flat affect Overall Cognitive  Status: Impaired/Different from baseline Area of Impairment: Orientation;Attention;Memory;Following commands;Safety/judgement;Awareness;Problem solving                 Orientation Level: Disoriented to;Person;Place;Time;Situation Current Attention Level: Focused Memory: Decreased short-term memory Following Commands: Follows one step commands inconsistently Safety/Judgement: Decreased awareness of safety;Decreased awareness of deficits Awareness: Intellectual Problem Solving: Slow processing;Decreased initiation;Difficulty sequencing;Requires verbal cues;Requires tactile cues General Comments: Pt's cognition different than baseline- she is nonverbal other than expressing pain and shouting "oh, oh" with abrupt movements. Pt is very lethargic; RN reports pt is hypercapnic thus lethargic and confused.   General Comments  Pt on 6L of O2. Resting SpO2 is 98%. After functional mobility, SpO2 is 94%    Exercises     Shoulder Instructions      Home Living Family/patient expects to be discharged to:: Private residence Living Arrangements: Children Available Help at Discharge: Family;Available PRN/intermittently Type of Home: House Home Access: Stairs to enter [employer] of Steps: 3 Entrance Stairs-Rails: Can Sellers both Home Layout: One level     Bathroom Shower/Tub: Occupational psychologist: Handicapped height Bathroom Accessibility: Yes How Accessible: Accessible via walker Home Equipment: [place] - 4 wheels;Grab bars - tub/shower;Hand held shower head;Other (comment);Adaptive equipment Adaptive Equipment: Sock aid Additional Comments: Home info retrieved from previous hospital stay as patient was unable to give today      Prior Functioning/Environment Level of Independence: Independent with assistive device(s)        Comments: Before recent SNF stay after hospitalization, pt able to walk into Dr's office with Rollator, reports independence in self care  and works together with son on housekeeping and meal prep         OT Problem List: Decreased strength;Decreased activity tolerance;Impaired balance (sitting and/or standing);Decreased coordination;Decreased cognition;Impaired vision/perception;Decreased knowledge of use of DME or AE;Decreased safety awareness;Cardiopulmonary status limiting activity;Obesity      OT Treatment/Interventions: Self-care/ADL training;DME and/or AE instruction;Therapeutic activities;Visual/perceptual remediation/compensation;Patient/family education;Balance training;Cognitive remediation/compensation;Therapeutic exercise    OT Goals(Current goals can be found in the care plan section) Acute Rehab OT Goals Patient Stated Goal: none stated OT Goal Formulation: Patient unable to participate in goal setting Time For Goal Achievement: 01/22/20 Potential to Achieve Goals: Fair  OT Frequency: Min 2X/week   Barriers to D/C: Decreased caregiver support          Co-evaluation PT/OT/SLP Co-Evaluation/Treatment: Yes Reason for Co-Treatment: Necessary to address cognition/behavior during functional activity;To address functional/ADL transfers;For patient/therapist safety;Complexity of the patient's impairments (multi-system involvement) PT goals addressed during session: Mobility/safety with mobility OT goals addressed during session: ADL's and self-care      AM-PAC OT "6 Clicks" Daily Activity     Outcome Measure Help from another person eating meals?: Total Help from another person taking care of personal grooming?: Total Help from another person toileting, which includes using toliet, bedpan, or urinal?: Total Help from another person bathing (including washing, rinsing, drying)?: Total Help from another person to put on and taking off regular upper body clothing?: Total Help from another person to put on and taking off regular lower body clothing?: Total  6 Click Score: 6   End of Session Nurse Communication:  Mobility status  Activity Tolerance: Patient limited by lethargy;Treatment limited secondary to medical complications (Comment) Patient left: in bed;with bed alarm set;with call bell/phone within Sellers  OT Visit Diagnosis: Unsteadiness on feet (R26.81);Other abnormalities of gait and mobility (R26.89);Pain;Other symptoms and signs involving cognitive function;Low vision, both eyes (H54.2);Muscle weakness (generalized) (M62.81) Pain - Right/Left: (unsure) Pain - part of body: (unsure)                Time: 0940-1003 OT Time Calculation (min): 23 min Charges:  OT General Charges $OT Visit: 1 Visit OT Evaluation $OT Eval Moderate Complexity: 1 [address] OTR/L   [name] 01/08/2020, 11:55 AM

## 2020-01-08 NOTE — Progress Notes (Signed)
NAME:  Ann Sellers, MRN:  [id number], DOB:  Apr 19, 1925, LOS: 1 ADMISSION DATE:  01/07/2020  Subjective  No overnight events. Pt very somulent this morning and responding minimally to sternal rub. Obtained ABG   Objective   Blood pressure (!) 130/51, pulse 70, temperature 98.5 F (36.9 C), temperature source Axillary, resp. rate (!) 25, SpO2 96 %.     Intake/Output Summary (Last 24 hours) at 01/08/2020 1110 Last data filed at 01/07/2020 2144 Gross per 24 hour  Intake 36.03 ml  Output --  Net 36.03 ml    Examination: GENERAL: somulent HEENT: airway patent CARDIAC: heart RRR. Systolic murmur present. Nonpitting edema of upper and lower extremities PULMONARY:  Distant lung sounds. tachypneic. ABDOMEN: bs active NEURO: minimal responsiveness but does grumble with sternal rub. Not moving extremities at this time SKIN: no rash or lesions on limited exam   Significant Diagnostic Tests:  3/19 CXR> cardiomegaly with vascular congestion and bilateral pleural effusions; persistent retrocardiac consolidation as seen on recent CT  Micro Data:  Urine culture>  Labs    CBC Latest Ref Rng & Units 01/08/2020 01/07/2020 01/07/2020  WBC 4.0 - 10.5 K/uL 6.5 - 5.6  Hemoglobin 12.0 - 15.0 g/dL 8.1(L) 10.9(L) 10.3(L)  Hematocrit 36.0 - 46.0 % 28.7(L) 32.0(L) 36.3  Platelets 150 - 400 K/uL 208 - PLATELET CLUMPS NOTED ON SMEAR, UNABLE TO ESTIMATE   BMP Latest Ref Rng & Units 01/08/2020 01/07/2020 01/07/2020  Glucose 70 - 99 mg/dL 118(H) 122(H) -  BUN 8 - 23 mg/dL 56(H) 55(H) -  Creatinine 0.44 - 1.00 mg/dL 2.12(H) 1.94(H) -  BUN/Creat Ratio 12 - 28 - - -  Sodium 135 - 145 mmol/L 147(H) 147(H) 143  Potassium 3.5 - 5.1 mmol/L 5.6(H) 5.8(H) 6.0(H)  Chloride 98 - 111 mmol/L 100 103 -  CO2 22 - 32 mmol/L 36(H) 36(H) -  Calcium 8.9 - 10.3 mg/dL 9.3 9.5 -    Summary  86 yo female with PMH of asthma, CKD IV who is presenting as a readmission from [facility] for progressive somulence over the past 2d  and admitted to [facility] for acute on chronic respiratory failure. Assessment & Plan:  Active Problems:   Acute respiratory failure (HCC)   Altered mental status  Acute hypercapnic respiratory failure with respiratory acidosis and metabolic alkalosis Similar presentation as previous admission with the somulence. ABG this morning with CO2 of 81. On last admission, symptoms resolved with BiPAP and she was only using it intermittently at night by time of discharge. It is likely that she may begin to require BiPAP chronically and will need it on discharge.  Assessment: unclear why she is retaining CO2 however may be from over oxygenation at facility since her O2 sats were low. Could consider an asthma exacerbation. CXR still shows the retrocardiac infiltrate however she already completed a 9d course of abx treatment with unasyn last admission and she does not have fever or leukocytosis to suggest infectious process.  Plan: placed on BiPAP this morning. Will recheck an ABG this afternoon. Will continue the prednisone as well as there may be a component of asthma exacerbation but may need IV until off BiPAP  CKD IV. Slight bump in renal function since hospital discharge. Likely from poor po intake over past few days supported by contraction alkalosis. She does appear fairly volume overloaded and BNP is 915 on admission so will start diuresis today and continue to monitor.  Hyperkalemic on admission. Improved this morning with insulin and calc glut  to 5.6. Plan: IV lasix 40mg . Continue daily lokelma. Daily weights. Tele monitoring.  Best practice:  CODE STATUS: DNR Diet: NPO DVT for prophylaxis: lovenox Social considerations/Family communication: will reach out to family later today Dispo: pending further management Patient's chronic respiratory failure due to asthma is life threatening.  Previous ABG's have documented high PCO2 and spirometry reveals moderate obstructive ventilatory defect.  Patient would  benefit from non-invasive ventilation.  Without this therapy, the patient is at high risk of ending up with worsening symptoms, worsened respiratory failure, need for ER visits and/or recurrent hospitalizations.  Bilevel device unable to adequately support patient's nocturnal ventilation needs.  Patient would benefit from NIV therapy with set tidal volumes and pressure.   Mitzi Hansen, MD INTERNAL MEDICINE RESIDENT PGY-1 PAGER #: [phone] 01/08/20  11:10 AM

## 2020-01-09 LAB — CBC
HCT: 29.1 % — ABNORMAL LOW (ref 36.0–46.0)
Hemoglobin: 8.8 g/dL — ABNORMAL LOW (ref 12.0–15.0)
MCH: 30.7 pg (ref 26.0–34.0)
MCHC: 30.2 g/dL (ref 30.0–36.0)
MCV: 101.4 fL — ABNORMAL HIGH (ref 80.0–100.0)
Platelets: 241 10*3/uL (ref 150–400)
RBC: 2.87 MIL/uL — ABNORMAL LOW (ref 3.87–5.11)
RDW: 14.5 % (ref 11.5–15.5)
WBC: 7.3 10*3/uL (ref 4.0–10.5)
nRBC: 0 % (ref 0.0–0.2)

## 2020-01-09 LAB — BASIC METABOLIC PANEL
Anion gap: 14 (ref 5–15)
Anion gap: 18 — ABNORMAL HIGH (ref 5–15)
BUN: 64 mg/dL — ABNORMAL HIGH (ref 8–23)
BUN: 66 mg/dL — ABNORMAL HIGH (ref 8–23)
CO2: 35 mmol/L — ABNORMAL HIGH (ref 22–32)
CO2: 30 mmol/L (ref 22–32)
Calcium: 9.4 mg/dL (ref 8.9–10.3)
Calcium: 9.3 mg/dL (ref 8.9–10.3)
Chloride: 99 mmol/L (ref 98–111)
Chloride: 97 mmol/L — ABNORMAL LOW (ref 98–111)
Creatinine, Ser: 2.39 mg/dL — ABNORMAL HIGH (ref 0.44–1.00)
Creatinine, Ser: 2.39 mg/dL — ABNORMAL HIGH (ref 0.44–1.00)
GFR calc Af Amer: 20 mL/min — ABNORMAL LOW (ref >60)
GFR calc Af Amer: 20 mL/min — ABNORMAL LOW (ref >60)
GFR calc non Af Amer: 17 mL/min — ABNORMAL LOW (ref >60)
GFR calc non Af Amer: 17 mL/min — ABNORMAL LOW (ref >60)
Glucose, Bld: 161 mg/dL — ABNORMAL HIGH (ref 70–99)
Glucose, Bld: 176 mg/dL — ABNORMAL HIGH (ref 70–99)
Potassium: 5.8 mmol/L — ABNORMAL HIGH (ref 3.5–5.1)
Potassium: 5.4 mmol/L — ABNORMAL HIGH (ref 3.5–5.1)
Sodium: 148 mmol/L — ABNORMAL HIGH (ref 135–145)
Sodium: 145 mmol/L (ref 135–145)

## 2020-01-09 LAB — GLUCOSE, CAPILLARY
Glucose-Capillary: 112 mg/dL — ABNORMAL HIGH (ref 70–99)
Glucose-Capillary: 111 mg/dL — ABNORMAL HIGH (ref 70–99)
Glucose-Capillary: 174 mg/dL — ABNORMAL HIGH (ref 70–99)
Glucose-Capillary: 85 mg/dL (ref 70–99)
Glucose-Capillary: 124 mg/dL — ABNORMAL HIGH (ref 70–99)
Glucose-Capillary: 170 mg/dL — ABNORMAL HIGH (ref 70–99)

## 2020-01-09 LAB — URINE CULTURE: Culture: <10000 — AB

## 2020-01-09 LAB — MRSA PCR SCREENING: MRSA by PCR: NEGATIVE

## 2020-01-09 MED ORDER — CALCITRIOL 0.25 MCG PO CAPS: 0.2500 ug | ORAL_CAPSULE | Freq: Every day | ORAL | Status: DC

## 2020-01-09 MED ORDER — VANCOMYCIN VARIABLE DOSE PER UNSTABLE RENAL FUNCTION (PHARMACIST DOSING): Status: DC

## 2020-01-09 MED ORDER — GABAPENTIN 600 MG PO TABS
300.0000 mg | ORAL_TABLET | Freq: Two times a day (BID) | ORAL | Status: DC
  Administered 2020-01-09 – 2020-01-17 (×16): 300 mg via ORAL
  Filled 2020-01-09 (×16): qty 1

## 2020-01-09 MED ORDER — VANCOMYCIN HCL IN DEXTROSE 1-5 GM/200ML-% IV SOLN
1000.0000 mg | Freq: Once | INTRAVENOUS | Status: AC
  Administered 2020-01-09: 1000 mg via INTRAVENOUS
  Filled 2020-01-09: qty 200

## 2020-01-09 MED ORDER — METOPROLOL SUCCINATE ER 50 MG PO TB24
50.0000 mg | ORAL_TABLET | Freq: Every day | ORAL | Status: DC
  Administered 2020-01-09 – 2020-01-17 (×9): 50 mg via ORAL
  Filled 2020-01-09 (×9): qty 1

## 2020-01-09 MED ORDER — SODIUM ZIRCONIUM CYCLOSILICATE 10 G PO PACK: 10.0000 g | PACK | Freq: Every day | ORAL | Status: DC

## 2020-01-09 MED ORDER — METOPROLOL TARTRATE 5 MG/5ML IV SOLN: 5.0000 mg | INTRAVENOUS | Status: DC | PRN

## 2020-01-09 MED ORDER — SODIUM ZIRCONIUM CYCLOSILICATE 10 G PO PACK
10.0000 g | PACK | Freq: Every day | ORAL | Status: DC
  Administered 2020-01-10 – 2020-01-11 (×2): 10 g via ORAL
  Filled 2020-01-09 (×3): qty 1

## 2020-01-09 MED ORDER — PREDNISONE 20 MG PO TABS
40.0000 mg | ORAL_TABLET | Freq: Every day | ORAL | Status: AC
  Administered 2020-01-10 – 2020-01-12 (×3): 40 mg via ORAL
  Filled 2020-01-09 (×3): qty 2

## 2020-01-09 MED ORDER — INSULIN ASPART 100 UNIT/ML IV SOLN
10.0000 [IU] | Freq: Once | INTRAVENOUS | Status: AC
  Administered 2020-01-09: 10 [IU] via INTRAVENOUS

## 2020-01-09 MED ORDER — METOPROLOL TARTRATE 5 MG/5ML IV SOLN
5.0000 mg | INTRAVENOUS | Status: AC | PRN
  Administered 2020-01-09 (×2): 5 mg via INTRAVENOUS
  Filled 2020-01-09 (×2): qty 5

## 2020-01-09 MED ORDER — SODIUM CHLORIDE 0.9 % IV SOLN
2.0000 g | INTRAVENOUS | Status: DC
  Administered 2020-01-09 – 2020-01-10 (×2): 2 g via INTRAVENOUS
  Filled 2020-01-09 (×2): qty 2

## 2020-01-09 MED ORDER — DEXTROSE 50 % IV SOLN
1.0000 | Freq: Once | INTRAVENOUS | Status: AC
  Administered 2020-01-09: 50 mL via INTRAVENOUS
  Filled 2020-01-09: qty 50

## 2020-01-09 MED ORDER — SODIUM BICARBONATE 650 MG PO TABS: 650.0000 mg | ORAL_TABLET | Freq: Two times a day (BID) | ORAL | Status: DC

## 2020-01-09 MED ORDER — SODIUM ZIRCONIUM CYCLOSILICATE 10 G PO PACK
10.0000 g | PACK | Freq: Two times a day (BID) | ORAL | Status: AC
  Administered 2020-01-09 (×2): 10 g via ORAL
  Filled 2020-01-09 (×2): qty 1

## 2020-01-09 NOTE — Progress Notes (Addendum)
Pharmacy Antibiotic Note  Ann Sellers is a 86 y.o. female admitted on 01/07/2020 with pneumonia.  Pharmacy has been consulted for cefepime dosing.  Recently treated for PNA - CXR on 3/19 showing persistent retrocardiac consolidation. WBC WNL (on steroids), now spiking fevers. Scr 2.39 (CrCl 14 mL/min).   Plan: Cefepime 2 g IV every 24 hours Will do vancomyicn 1 g IV once and then dose by level given Scr and age Monitor MRSA PCR- strongly consider discontinuation if negative Monitor renal fx, cx results, clinical pic, and opportunities for de-escalation   Weight: 182 lb 1.6 oz (82.6 kg)  Temp (24hrs), Avg:99.3 F (37.4 C), Min:98.5 F (36.9 C), Max:100.6 F (38.1 C)  Recent Labs  Lab 01/03/20 0228 01/03/20 1115 01/07/20 1947 01/07/20 1947 01/07/20 2345 01/08/20 0529 01/08/20 1245 01/08/20 1720 01/09/20 0239  WBC 5.3  --  5.6  --   --  6.5  --   --  7.3  CREATININE 1.92*   < > 1.94*   < > 1.94* 2.12* 2.08* 2.12* 2.39*   < > = values in this interval not displayed.    Estimated Creatinine Clearance: 14 mL/min (A) (by C-G formula based on SCr of 2.39 mg/dL (H)).    Allergies  Allergen Reactions  . Losartan Cough  . Snake Antivenin [Antivenin Crotalidae Polyvalent (Snake Antivenin)] Other (See Comments)    PARALYSIS    Antimicrobials this admission: Cefepime 3/21 >>  Vancomycin 3/21>>  Dose adjustments this admission: N/A  Microbiology results: 3/21 BCx: sent 3/20 UCx: <10k insignificant growth  3/21 MRSA PCR: sent  Thank you for allowing pharmacy to be a part of this patient's care.  Antonietta Jewel, PharmD, BCCCP Clinical Pharmacist  Phone: [phone]  Please check AMION for all [place] phone numbers After 10:00 PM, call [place] [phone] 01/09/2020 8:02 AM

## 2020-01-09 NOTE — Evaluation (Signed)
Clinical/Bedside Swallow Evaluation Patient Details  Name: Ann Sellers MRN: [id number] Date of Birth: [birth year]  Today's Date: 01/09/2020 Time: SLP Start Time (ACUTE ONLY): 9924 SLP Stop Time (ACUTE ONLY): 0852 SLP Time Calculation (min) (ACUTE ONLY): 15 min  Past Medical History:  Past Medical History:  Diagnosis Date  . Anemia   . Arthritis   . Asthma   . B12 deficiency   . Chronic diastolic CHF (congestive heart failure) ([place])   . CKD (chronic kidney disease), stage III   . Diabetes mellitus ([place])   . Hypertension   . Moderate aortic stenosis   . Neuromuscular disorder ([place])   . PAF (paroxysmal atrial fibrillation) ([place])   . Right rotator cuff tear   . Wears dentures    top denture-bottom partial  . Wears glasses   . Wears hearing aid    both ears   Past Surgical History:  Past Surgical History:  Procedure Laterality Date  . APPENDECTOMY  1945  . CATARACT EXTRACTION, BILATERAL  2008  . cervical stynosis  2005  . esophageal stretch  2001  . [place]  . herniated disc  1983  . left hand surgery  2011   gout  . left knee replacement  2002  . left knee surgery  1998  . right wrist fracture  2006  . SHOULDER ARTHROSCOPY WITH ROTATOR CUFF REPAIR Right 01/05/2013   Procedure: SHOULDER ARTHROSCOPY WITH ROTATOR CUFF REPAIR subchromial decompression and distal clavical excision;  Surgeon: Lorn Junes, MD;  Location: [facility];  Service: Orthopedics;  Laterality: Right;  . tonsillectomy  1936  . TONSILLECTOMY    . TYMPANOSTOMY TUBE PLACEMENT  1989  . VESICOVAGINAL FISTULA CLOSURE W/ TAH  1968   HPI:  Pt is a 86 year old woman with PMH of Asthma, DM, HTN, Afib, CKD who presented from SNF for encephalopathy. CWXR 3/19: Persistent retrocardiac consolidation. CT of the head 3/11: no acute findings. WBC WNL and pt afebrile at time of eval. Pt seen by SLP during prior admission 3/8-3/12 with final recommendation of regular diet & thin  liquids.    Assessment / Plan / Recommendation Clinical Impression  Pt was seen for bedside swallow evaluation and she denied a history of dysphagia. Oral mechanism exam was limited due to pt's difficulty following some commands; however, oral motor strength and ROM appeared grossly WFL with adequate dentition. She exhibited symptoms of oropharyngeal dysphagia characterized by prolonged mastication, and inconsistent, delayed throat clearing with consecutive swallows of thin liquid and regular texture solids. A dysphagia 2 diet with thin liquids is recommended at this time. SLP will follow to assess diet tolerance, persistence of symptoms, her ability to tolerate more advanced consistencies, and the need for instrumental assessment.  SLP Visit Diagnosis: Dysphagia, unspecified (R13.10)    Aspiration Risk  Mild aspiration risk    Diet Recommendation Dysphagia 2 (Fine chop);Thin liquid   Liquid Administration via: Cup;Straw Medication Administration: Whole meds with puree Supervision: Staff to assist with self feeding Compensations: Minimize environmental distractions;Slow rate;Small sips/bites Postural Changes: Seated upright at 90 degrees;Remain upright for at least 30 minutes after po intake    Other  Recommendations Oral Care Recommendations: Oral care BID;Staff/trained caregiver to provide oral care   Follow up Recommendations Other (comment)(TBD)      Frequency and Duration min 2x/week  2 weeks       Prognosis Prognosis for Safe Diet Advancement: Good Barriers to Reach Goals: Cognitive deficits  Swallow Study   General Date of Onset: 01/08/20 HPI: Pt is a 86 year old woman with PMH of Asthma, DM, HTN, Afib, CKD who presented from SNF for encephalopathy. CWXR 3/19: Persistent retrocardiac consolidation. CT of the head 3/11: no acute findings. WBC WNL and pt afebrile at time of eval. Pt seen by SLP during prior admission 3/8-3/12 with final recommendation of regular diet &  thin liquids.  Type of Study: Bedside Swallow Evaluation Previous Swallow Assessment: See HPI Diet Prior to this Study: NPO Temperature Spikes Noted: No Respiratory Status: Nasal cannula History of Recent Intubation: No Behavior/Cognition: Alert;Cooperative Oral Cavity Assessment: Within Functional Limits Oral Care Completed by SLP: No Oral Cavity - Dentition: Dentures, top;Dentures, bottom Vision: Impaired for self-feeding Self-Feeding Abilities: Needs assist Patient Positioning: Upright in bed;Postural control adequate for testing Baseline Vocal Quality: Normal Volitional Cough: Cognitively unable to elicit Volitional Swallow: Able to elicit    Oral/Motor/Sensory Function Overall Oral Motor/Sensory Function: Within functional limits(But pt unable to follow all commands)   Ice Chips Ice chips: Within functional limits Presentation: Spoon   Thin Liquid Thin Liquid: Impaired Presentation: Straw Pharyngeal  Phase Impairments: Throat Clearing - Delayed    Nectar Thick Nectar Thick Liquid: Not tested   Honey Thick Honey Thick Liquid: Not tested   Puree Puree: Within functional limits Presentation: Spoon   Solid     Solid: Impaired Oral Phase Impairments: Impaired mastication Oral Phase Functional Implications: Impaired mastication Pharyngeal Phase Impairments: Throat Clearing - Delayed     Jeanann Balinski I. Hardin Negus, [place], [place] Office number [phone] Pager [place] 01/09/2020,9:06 AM

## 2020-01-09 NOTE — Progress Notes (Signed)
  Date: 01/09/2020  Patient name: Ann Sellers  Medical record number: [id number]  Date of birth: 11/29/1924   This patient's plan of care was discussed with the house staff. Please see Dr. Aurelio Jew note for complete details. I concur with her findings.   Sid Falcon, MD 01/09/2020, 8:40 PM

## 2020-01-09 NOTE — Progress Notes (Addendum)
   Subjective:   She is somewhat more responsive this morning. She is off bipap. She denies pain or cough but endorses mild shortness of breath. She is not sure where she is but states she remembers being at [facility] last week.   Objective:  Vital signs in last 24 hours: Vitals:   01/08/20 2320 01/09/20 0000 01/09/20 0414 01/09/20 0500  BP:      Pulse:      Resp: 17     Temp:      TempSrc:      SpO2:  95% 100%   Weight:    82.6 kg   Constitution: NAD, acutely ill appearing Cardio: RRR, no m/r/g, no LE edema  Respiratory: CTA, no w/r/r Abdominal: NTTP, soft, non-distended Neuro: normal affect, alert to self Skin: c/d/i   Assessment/Plan:  Active Problems:   Acute respiratory failure (HCC)   Altered mental status  86yo female with PMH severe   Acute Hypercapnic Respiratory Failure 2/2 Asthma Exacerbation Fever with recent Aspiration Pneumonia  Mental status has improved significantly after treatment of bipap yesterday and overnight. She likely will require this intermittently in the future, hypercapnea likely secondary to her asthma although she has not required inhalers for the last several years. She is also spiking fever since admission. UA without bacteria and insignificant growth on culture. Recently admitted with aspiration pneumonia. No new consolidation on x-ray compared to prior. Possible recurrence of her aspiration pneumonia as she continued to have temperatures of 99 prior to last discharge. Fever possibly in the setting of acute illness as well but with recent hospitalization, acute respiratory failure and history of aspiration will treat empirically.  - cont. Steroids - can switch back to po today  - continue bipap prn  - duonebs prn - cont. Home dulera  - MRSA swab pending  - vanc, cefepime - repeat MRSA swab - blood culture - consider anaerobic vs. atypical coverage if no improvement   AKI on CKD  Renal function not improved with diuresis despite signs  of volume overload yesterday. Appears euvolemic today. Repeat echo shows mod-severe AS progressed to severe, grade I diastolic dysfunction, and LVH. She may have limited perfusion and will need careful balance of diuretics and fluid overload.  - hold lasix for now  Hyperkalemia Controlled yesterday and overnight with insulin/D5, unable to take po lokelma. Mental status has improved.   - lokelma bid today, then daily  - trend K  TIIDM SSI - will likely need to increase as she   VTE: heparin IVF: none Diet: dysphagia 2 Code: DNR  Dispo: Anticipated discharge pending clinical imrovement.   Marty Heck, DO 01/09/2020, 7:47 AM Pager: [phone]

## 2020-01-09 NOTE — Progress Notes (Signed)
Ann Sellers made aware that heart rate is 110-130's after 10mg  IV metoprolol and 50mg  po metoprolol. No new orders received.

## 2020-01-10 LAB — BASIC METABOLIC PANEL
Anion gap: 13 (ref 5–15)
BUN: 67 mg/dL — ABNORMAL HIGH (ref 8–23)
CO2: 35 mmol/L — ABNORMAL HIGH (ref 22–32)
Calcium: 9 mg/dL (ref 8.9–10.3)
Chloride: 99 mmol/L (ref 98–111)
Creatinine, Ser: 2.44 mg/dL — ABNORMAL HIGH (ref 0.44–1.00)
GFR calc Af Amer: 19 mL/min — ABNORMAL LOW (ref >60)
GFR calc non Af Amer: 17 mL/min — ABNORMAL LOW (ref >60)
Glucose, Bld: 152 mg/dL — ABNORMAL HIGH (ref 70–99)
Potassium: 4.8 mmol/L (ref 3.5–5.1)
Sodium: 147 mmol/L — ABNORMAL HIGH (ref 135–145)

## 2020-01-10 LAB — CBC
HCT: 29.9 % — ABNORMAL LOW (ref 36.0–46.0)
Hemoglobin: 9 g/dL — ABNORMAL LOW (ref 12.0–15.0)
MCH: 31 pg (ref 26.0–34.0)
MCHC: 30.1 g/dL (ref 30.0–36.0)
MCV: 103.1 fL — ABNORMAL HIGH (ref 80.0–100.0)
Platelets: 181 10*3/uL (ref 150–400)
RBC: 2.9 MIL/uL — ABNORMAL LOW (ref 3.87–5.11)
RDW: 14.6 % (ref 11.5–15.5)
WBC: 7.7 10*3/uL (ref 4.0–10.5)
nRBC: 0 % (ref 0.0–0.2)

## 2020-01-10 LAB — GLUCOSE, CAPILLARY
Glucose-Capillary: 104 mg/dL — ABNORMAL HIGH (ref 70–99)
Glucose-Capillary: 118 mg/dL — ABNORMAL HIGH (ref 70–99)
Glucose-Capillary: 309 mg/dL — ABNORMAL HIGH (ref 70–99)
Glucose-Capillary: 166 mg/dL — ABNORMAL HIGH (ref 70–99)

## 2020-01-10 MED ORDER — DEXTROSE 50 % IV SOLN
1.0000 | Freq: Once | INTRAVENOUS | Status: AC
  Administered 2020-01-10: 50 mL via INTRAVENOUS
  Filled 2020-01-10: qty 50

## 2020-01-10 MED ORDER — INSULIN ASPART 100 UNIT/ML IV SOLN
10.0000 [IU] | Freq: Once | INTRAVENOUS | Status: AC
  Administered 2020-01-10: 10 [IU] via INTRAVENOUS

## 2020-01-10 MED ORDER — SODIUM CHLORIDE 0.9 % IV SOLN: INTRAVENOUS | Status: AC

## 2020-01-10 NOTE — TOC Progression Note (Addendum)
Transition of Care [facility]) - Progression Note    Patient Details  Name: Ann Sellers MRN: [id number] Date of Birth: 1925-07-26  Transition of Care [facility]) CM/SW [place], RN Phone Number: 01/10/2020, 3:47 PM  Clinical Narrative:  CM spoke with son and patient at bedside both give verbal consent to fax info out to facilitiesfor bed offers.CM at bedside with son and daughter to follow up on choices offered for SNF placement. CM updated family on the list of facilities that have sent bed offers for the patient. Son has refused bed offers. Son requested that CM contact [facility] for bed offer. Message sent to liaison for [facility] and there are currently no beds available. Son and daughter have been updated about no bed availability.  Son has requested to know if his mother is eligible for home services and medicaid. CM discussed available home services such as PT/OT and aide. Family also advised to follow up with DSS for medicaid eligibility. MD notification for request for new covid test if patient will be medically stable for discharge tomorrow. Family is not able to make decision on bed offer at this time. Will continue to follow.         Expected Discharge Plan and Services                                                 Social Determinants of Health (SDOH) Interventions    Readmission Risk Interventions No flowsheet data found.

## 2020-01-10 NOTE — Care Management Important Message (Signed)
Important Message  Patient Details  Name: Ann Sellers MRN: [id number] Date of Birth: [birth year]   Medicare Important Message Given:  Yes  Patient was not able to sign.  I signed at the patient request signed copy left at the patient bedisde.   Shawna Kiener 01/10/2020, 12:12 PM

## 2020-01-10 NOTE — Progress Notes (Signed)
NAME:  Ann Sellers, MRN:  [id number], DOB:  10-09-1925, LOS: 3 ADMISSION DATE:  01/07/2020  Subjective/Interm History  Converted into afib with tachycardia (HR 120s-140s) last pm starting around 1700. 10mg  IV metoprolol, 50mg  po metoprolol given--remained tachycardic. Converted back into SR with HR 60s-70s around 0500 this morning.  Remainder VSS. Still somewhat somnolent this morning but does open her eyes to voice.  Objective   Blood pressure 116/60, pulse 72, temperature 98 F (36.7 C), temperature source Oral, resp. rate 20, weight 82.1 kg, SpO2 98 %.     Intake/Output Summary (Last 24 hours) at 01/10/2020 1105 Last data filed at 01/10/2020 0600 Gross per 24 hour  Intake 480 ml  Output 725 ml  Net -245 ml    Examination: GENERAL: chronically ill appearing female CARDIAC: heart RRR. Systolic murmur present. Extremity edema resolved this morning PULMONARY:  Breathing comfortably on 3LNC. Lung sounds distant but no adventitious sounds appreciated NEURO: opens eyes to voice but still fairly somnolent. Is able to answer simple questions SKIN: no rash or lesions on limited exam   Significant Diagnostic Tests:  3/19 CXR> cardiomegaly with vascular congestion and bilateral pleural effusions; persistent retrocardiac consolidation as seen on recent CT  Micro Data:  3/20 Urine culture>NGTD 3/21 Blood culture>NGTD  Labs    CBC Latest Ref Rng & Units 01/10/2020 01/09/2020 01/08/2020  WBC 4.0 - 10.5 K/uL 7.7 7.3 6.5  Hemoglobin 12.0 - 15.0 g/dL 9.0(L) 8.8(L) 8.1(L)  Hematocrit 36.0 - 46.0 % 29.9(L) 29.1(L) 28.7(L)  Platelets 150 - 400 K/uL 181 241 208   BMP Latest Ref Rng & Units 01/10/2020 01/09/2020 01/09/2020  Glucose 70 - 99 mg/dL 152(H) 176(H) 161(H)  BUN 8 - 23 mg/dL 67(H) 66(H) 64(H)  Creatinine 0.44 - 1.00 mg/dL 2.44(H) 2.39(H) 2.39(H)  BUN/Creat Ratio 12 - 28 - - -  Sodium 135 - 145 mmol/L 147(H) 145 148(H)  Potassium 3.5 - 5.1 mmol/L 4.8 5.4(H) 5.8(H)  Chloride 98 - 111  mmol/L 99 97(L) 99  CO2 22 - 32 mmol/L 35(H) 30 35(H)  Calcium 8.9 - 10.3 mg/dL 9.0 9.3 9.4    Summary  86 yo female with PMH of asthma, CKD IV who is presenting as a readmission from [facility] for progressive somulence over the past 2d and admitted to IMTS for acute on chronic respiratory failure. Assessment & Plan:  Active Problems:   Acute respiratory failure (HCC)   Altered mental status  Acute hypercapnic respiratory failure with respiratory acidosis and metabolic alkalosis CXR on admission shows persistant retrocardiac infiltrate, however, given lack of other infectious sx, would not consider this to be a persistent pneumonia necessarily, as infiltrate can still be apparent for several weeks after treatment which she received last admission. She remains afebrile without leukocytosis. Repeat ABG after BiPAP placement showed improvement in CO2. Now on 3L  with prn BiPAP. Note review from yesterday indicates improvement in mental status. I agree she is better than on Friday but certainly is not back to how her mental status was at discharge last admission. Assessment: unclear why she has began to require BiPAP over the recent weeks. Possibly CO2 retention from asthma exacerbation? Plan:  will continue prn BiPAP.  Continue prednisone 40mg  (day #3/5). D/C cefepime Continue singulair, dulera. Prn albuterol nebs.  CKD IV. Slight decline in renal function since admission; 2>2.1>2.4. slight hypernatremia may indicate intravascular volume depletion. 2L free water deficit. I/O indicate minimal po intake since admission. Hyperkalemia. Stable this morning at 4.8 Plan: Continue daily lokelma.  phoslo tid. May consider gentle IVF later today but will tread carefully in light of her heart failure.   Chronic diastolic heart failure.   -0.5kg from admission Paroxysmal atrial fibrillation not on anticoagulation. Follows with Dr. Meda Coffee from cardiology.  Brief episode of afib last pm. Converted back  into SR this morning with HR 60s-70s. Severe Aortic Stenosis. Echo obtained this admission shows progression of degree of stenosis. Plan Continue metoprolol xl 50mg  IV lopressor prn from HR>140. Hold for SBP<100 Daily weights. Tele monitoring.  Type II Diabetes Mellitus Blood sugars stable. Continue SSI.  Best practice:  CODE STATUS: DNR Diet: dysphagia 2 DVT for prophylaxis: lovenox Social considerations/Family communication: will reach out to family later today Dispo: pending further management  Patient's chronic respiratory failure due to asthma is life threatening.  Previous ABG's have documented high PCO2 and spirometry reveals moderate obstructive ventilatory defect.  Patient would benefit from non-invasive ventilation.  Without this therapy, the patient is at high risk of ending up with worsening symptoms, worsened respiratory failure, need for ER visits and/or recurrent hospitalizations.  Bilevel device unable to adequately support patient's nocturnal ventilation needs.  Patient would benefit from NIV therapy with set tidal volumes and pressure.   Mitzi Hansen, MD INTERNAL MEDICINE RESIDENT PGY-1 PAGER #: [phone] 01/10/20  11:05 AM

## 2020-01-10 NOTE — Progress Notes (Signed)
Central telemetry reported that patient converted from Afib back to normal sinus at a rate of 73.

## 2020-01-10 NOTE — Progress Notes (Signed)
  Speech Language Pathology Treatment: Dysphagia  Patient Details Name: Ann Sellers MRN: [id number] DOB: 1925-04-06 Today's Date: 01/10/2020 Time: 5208-0223 SLP Time Calculation (min) (ACUTE ONLY): 15 min  Assessment / Plan / Recommendation Clinical Impression  Pt was encountered asleep in bed with son and daughter present at bedside.  Pt roused to minimal verbal stimulation and she was agreeable to ST tx session.  Pt was observed with trials of thin liquid, puree, and regular solids.  She was able to feed herself with assistance and she exhibited good bolus acceptance with all trials.  Mastication was mildly prolonged with regular solids and trace oral residue was observed on the pt's posterior lingual surface.  Pt was sensate to residue and cleared it with a cued liquid wash.  No overt s/sx of aspiration were observed with any PO trials despite challenging.  Recommend diet upgrade to Dysphagia 3 (soft) solids and continuation of thin liquids with medications administered whole in puree.  Pt requested that she not receive broccoli, [place], chicken, or beef.  This was noted in her diet order.  Family additionally requested 1:1 assistance for feeding when they are not present.  Spoke with RN regarding recommendations and family requests.  SLP will f/u for diagnostic treatment and to monitor diet tolerance per POC.      HPI HPI: Pt is a 86 year old woman with PMH of Asthma, DM, HTN, Afib, CKD who presented from SNF for encephalopathy. CWXR 3/19: Persistent retrocardiac consolidation. CT of the head 3/11: no acute findings. WBC WNL and pt afebrile at time of eval. Pt seen by SLP during prior admission 3/8-3/12 with final recommendation of regular diet & thin liquids.       SLP Plan  Continue with current plan of care       Recommendations  Diet recommendations: Dysphagia 3 (mechanical soft);Thin liquid Liquids provided via: Cup;Straw Medication Administration: Whole meds with  puree Supervision: Staff to assist with self feeding;Full supervision/cueing for compensatory strategies;Trained caregiver to feed patient Compensations: Minimize environmental distractions;Slow rate;Small sips/bites Postural Changes and/or Swallow Maneuvers: Seated upright 90 degrees                Oral Care Recommendations: Oral care BID;Staff/trained caregiver to provide oral care Follow up Recommendations: Other (comment)(TBD) SLP Visit Diagnosis: Dysphagia, unspecified (R13.10) Plan: Continue with current plan of care       Carroll., M.S., [place] Office: [phone]  [place] 01/10/2020, 4:01 PM

## 2020-01-10 NOTE — NC FL2 (Signed)
[place] LEVEL OF CARE SCREENING TOOL     IDENTIFICATION  Patient Name: Ann Sellers Birthdate: 1925/03/22 Sex: female Admission Date (Current Location): 01/07/2020  [facility] and [place] Number:  Herbalist and Address:  [facility] [place] [address] [place], [place] [address]      Provider Number: [id number]  Attending Physician Name and Address:  Lucious Groves, DO  Relative Name and Phone Number:  Shakema Surita [phone]    Current Level of Care: Hospital Recommended Level of Care: [place] Prior Approval Number:    Date Approved/Denied:   PASRR Number: [id number]  Discharge Plan: SNF    Current Diagnoses: Patient Active Problem List   Diagnosis Date Noted  . Altered mental status   . Acute renal failure superimposed on stage 3b chronic kidney disease ([place])   . Acute encephalopathy 01/03/2020  . Acute respiratory failure ([place])   . Pressure injury of skin 12/28/2019  . Pneumonia 12/26/2019  . PAF (paroxysmal atrial fibrillation) ([place]) 12/23/2016  . Aortic stenosis, moderate 09/16/2016  . Community acquired pneumonia 09/13/2016  . Lower extremity weakness 09/13/2016  . Chronic diastolic heart failure ([place]) 09/13/2016  . Severe persistent chronic asthma without complication 18/29/9371  . Morbid obesity ([place]) 07/06/2015  . Edema 07/22/2014  . Varicose veins of lower extremities with complications 69/67/8938  . Chronic kidney disease (CKD), stage IV (severe) ([place]) 02/01/2014  . Right rotator cuff tear   . Hypertension   . Arthritis   . Insulin dependent diabetes mellitus with complications   . Spinal stenosis   . Wears glasses   . Wears dentures   . Wears hearing aid   . Asthma 07/11/2011  . Vitamin D deficiency 07/11/2011  . Osteoarthritis 07/11/2011  . Cough 07/11/2011    Orientation RESPIRATION BLADDER Height & Weight     Self, Time(disoriented to situation)  Normal   Weight: 82.1  kg Height:     BEHAVIORAL SYMPTOMS/MOOD NEUROLOGICAL BOWEL NUTRITION STATUS        Diet(Dysphagia 2)  AMBULATORY STATUS COMMUNICATION OF NEEDS Skin   Limited Assist Verbally Bruising, Other (Comment)(eczema, skin tear to right arm, ecchymosis abdomen moisture skin demage to bilateral groins)                       Personal Care Assistance Level of Assistance  Bathing, Feeding, Dressing Bathing Assistance: Limited assistance Feeding assistance: Maximum assistance Dressing Assistance: Limited assistance     Functional Limitations Info  Sight Sight Info: Impaired        SPECIAL CARE FACTORS FREQUENCY  PT (By licensed PT), OT (By licensed OT)     PT Frequency: 5X OT Frequency: 5X            Contractures Contractures Info: Not present    Additional Factors Info    Code Status Info: DNR Allergies Info: Losartan, snake antivenom           Current Medications (01/10/2020):  This is the current hospital active medication list Current Facility-Administered Medications  Medication Dose Route Frequency Provider Last Rate Last Admin  . 0.9 %  sodium chloride infusion   Intravenous Continuous Christian, Rylee, MD      . acetaminophen (TYLENOL) tablet 650 mg  650 mg Oral Q6H PRN Chundi, Vahini, MD       Or  . acetaminophen (TYLENOL) suppository 650 mg  650 mg Rectal Q6H PRN Chundi, Vahini, MD   650 mg  at 01/08/20 2039  . albuterol (PROVENTIL) (2.5 MG/3ML) 0.083% nebulizer solution 10 mg  10 mg Nebulization Once Lars Mage, MD   Stopped at 01/08/20 0234  . albuterol (PROVENTIL) (2.5 MG/3ML) 0.083% nebulizer solution 2.5 mg  2.5 mg Nebulization Q6H PRN Chundi, Vahini, MD      . allopurinol (ZYLOPRIM) tablet 50 mg  50 mg Oral QODAY Chundi, Vahini, MD   50 mg at 01/10/20 0911  . aspirin chewable tablet 81 mg  81 mg Oral Daily Chundi, Vahini, MD   81 mg at 01/10/20 0911  . calcitRIOL (ROCALTROL) capsule 0.25 mcg  0.25 mcg Oral Daily Chundi, Vahini, MD   0.25 mcg at 01/10/20  0911  . calcium acetate (PHOSLO) capsule 667 mg  667 mg Oral TID WC Chundi, Vahini, MD   667 mg at 01/10/20 0747  . Chlorhexidine Gluconate Cloth 2 % PADS 6 each  6 each Topical Daily Sid Falcon, MD   6 each at 01/10/20 [phone]  . gabapentin (NEURONTIN) tablet 300 mg  300 mg Oral BID Seawell, Jaimie A, DO   300 mg at 01/10/20 0911  . heparin injection 5,000 Units  5,000 Units Subcutaneous Q8H Chundi, Verne Spurr, MD   5,000 Units at 01/10/20 0521  . insulin aspart (novoLOG) injection 0-6 Units  0-6 Units Subcutaneous TID WC Seawell, Jaimie A, DO   1 Units at 01/08/20 1717  . iron polysaccharides (NIFEREX) capsule 150 mg  150 mg Oral Daily Chundi, Vahini, MD   150 mg at 01/10/20 0911  . lip balm (CARMEX) ointment   Topical PRN Christian, Rylee, MD      . metoprolol succinate (TOPROL-XL) 24 hr tablet 50 mg  50 mg Oral Daily Seawell, Jaimie A, DO   50 mg at 01/10/20 0911  . mometasone-formoterol (DULERA) 100-5 MCG/ACT inhaler 2 puff  2 puff Inhalation BID Lars Mage, MD   2 puff at 01/10/20 0801  . montelukast (SINGULAIR) tablet 10 mg  10 mg Oral QHS Chundi, Vahini, MD   10 mg at 01/09/20 2047  . pantoprazole (PROTONIX) EC tablet 40 mg  40 mg Oral Daily Chundi, Vahini, MD   40 mg at 01/10/20 0911  . predniSONE (DELTASONE) tablet 40 mg  40 mg Oral Q breakfast Seawell, Jaimie A, DO   40 mg at 01/10/20 0746  . promethazine (PHENERGAN) tablet 12.5 mg  12.5 mg Oral Q6H PRN Chundi, Vahini, MD      . senna-docusate (Senokot-S) tablet 1 tablet  1 tablet Oral QHS PRN Chundi, Vahini, MD      . sodium zirconium cyclosilicate (LOKELMA) packet 10 g  10 g Oral Daily Seawell, Jaimie A, DO   10 g at 01/10/20 2248     Discharge Medications: Please see discharge summary for a list of discharge medications.  Relevant Imaging Results:  Relevant Lab Results:   Additional Information SSN [id number]  Angelita Ingles, RN

## 2020-01-11 DIAGNOSIS — R4182 Altered mental status, unspecified: Secondary | ICD-10-CM

## 2020-01-11 DIAGNOSIS — J9602 Acute respiratory failure with hypercapnia: Secondary | ICD-10-CM

## 2020-01-11 DIAGNOSIS — Z7189 Other specified counseling: Secondary | ICD-10-CM

## 2020-01-11 DIAGNOSIS — Z515 Encounter for palliative care: Secondary | ICD-10-CM

## 2020-01-11 LAB — BASIC METABOLIC PANEL
Anion gap: 9 (ref 5–15)
BUN: 76 mg/dL — ABNORMAL HIGH (ref 8–23)
CO2: 34 mmol/L — ABNORMAL HIGH (ref 22–32)
Calcium: 8.5 mg/dL — ABNORMAL LOW (ref 8.9–10.3)
Chloride: 100 mmol/L (ref 98–111)
Creatinine, Ser: 2.28 mg/dL — ABNORMAL HIGH (ref 0.44–1.00)
GFR calc Af Amer: 21 mL/min — ABNORMAL LOW (ref >60)
GFR calc non Af Amer: 18 mL/min — ABNORMAL LOW (ref >60)
Glucose, Bld: 154 mg/dL — ABNORMAL HIGH (ref 70–99)
Potassium: 4.1 mmol/L (ref 3.5–5.1)
Sodium: 143 mmol/L (ref 135–145)

## 2020-01-11 LAB — GLUCOSE, CAPILLARY
Glucose-Capillary: 115 mg/dL — ABNORMAL HIGH (ref 70–99)
Glucose-Capillary: 189 mg/dL — ABNORMAL HIGH (ref 70–99)
Glucose-Capillary: 231 mg/dL — ABNORMAL HIGH (ref 70–99)
Glucose-Capillary: 290 mg/dL — ABNORMAL HIGH (ref 70–99)
Glucose-Capillary: 349 mg/dL — ABNORMAL HIGH (ref 70–99)

## 2020-01-11 LAB — SARS CORONAVIRUS 2 (TAT 6-24 HRS): SARS Coronavirus 2: NEGATIVE

## 2020-01-11 MED ORDER — ARFORMOTEROL TARTRATE 15 MCG/2ML IN NEBU
15.0000 ug | INHALATION_SOLUTION | Freq: Two times a day (BID) | RESPIRATORY_TRACT | Status: DC
  Administered 2020-01-11 – 2020-01-17 (×13): 15 ug via RESPIRATORY_TRACT
  Filled 2020-01-11 (×15): qty 2

## 2020-01-11 MED ORDER — BUDESONIDE 0.25 MG/2ML IN SUSP
0.2500 mg | Freq: Two times a day (BID) | RESPIRATORY_TRACT | Status: DC
  Administered 2020-01-11 – 2020-01-17 (×13): 0.25 mg via RESPIRATORY_TRACT
  Filled 2020-01-11 (×15): qty 2

## 2020-01-11 MED ORDER — INSULIN ASPART 100 UNIT/ML ~~LOC~~ SOLN
0.0000 [IU] | Freq: Three times a day (TID) | SUBCUTANEOUS | Status: DC
  Administered 2020-01-11: 8 [IU] via SUBCUTANEOUS
  Administered 2020-01-12: 3 [IU] via SUBCUTANEOUS
  Administered 2020-01-12: 2 [IU] via SUBCUTANEOUS
  Administered 2020-01-12: 8 [IU] via SUBCUTANEOUS
  Administered 2020-01-13 – 2020-01-14 (×5): 2 [IU] via SUBCUTANEOUS
  Administered 2020-01-15 – 2020-01-16 (×2): 3 [IU] via SUBCUTANEOUS
  Administered 2020-01-16: 2 [IU] via SUBCUTANEOUS
  Administered 2020-01-17: 3 [IU] via SUBCUTANEOUS

## 2020-01-11 NOTE — TOC Progression Note (Signed)
Transition of Care [facility]) - Progression Note    Patient Details  Name: ARIANIE COUSE MRN: [id number] Date of Birth: 1925-03-09  Transition of Care [facility]) CM/SW Contact  Angelita Ingles, RN Phone Number: 01/11/2020, 4:16 PM  Clinical Narrative:    Bed offers presented to the family however the family has not made a decision. Family declined [facility] and  [facility] and wants to continue to review list of bed offers. Son at bedside updated and explained that the goal is to transition to discharge. Son continues to not be able to make a decision and is requesting information on the appeals process for discharges. MD has been made aware. Will continue to follow.         Expected Discharge Plan and Services                                                 Social Determinants of Health (SDOH) Interventions    Readmission Risk Interventions Readmission Risk Prevention Plan 01/11/2020  Transportation Screening Complete  PCP or Specialist Appt within 3-5 Days Complete  HRI or [place] Complete  Social Work Consult for [place] Planning/Counseling Complete  Palliative Care Screening Not Applicable  Medication Review [employer]) Referral to Pharmacy  Some recent data might be hidden

## 2020-01-11 NOTE — Consult Note (Signed)
Consultation Note Date: 01/11/2020   Patient Name: Ann Sellers  DOB: [birth year]  MRN: [id number]  Age / Sex: 86 y.o., female   PCP: Ann Bowen, MD Referring Physician: Lucious Groves, DO   REASON FOR CONSULTATION:Establishing goals of care  Palliative Care consult requested for goals of care discussion in this 86 y.o. female with multiple medical problems including asthma, CKD IV, diastolic heart failure, atrial fibrillation (no anticoagulation), aortic stenosis, hypertension, and diabetes. Patient presented to the ED from [facility] with complaints of lethargy and shortness of breath. Per notation patient was placed on oxygen prior to hospitalization and began to decline days prior requiring increased oxygen requirement. During her ED work-up patient found to be hyperkalemic. Since admission she has required the use of BiPAP and continues to require intermittent support. She is more awake and alert since admission.   Clinical Assessment and Goals of Care: I have reviewed medical records including lab results, imaging, Epic notes, and MAR, received report from the bedside RN, and assessed the patient. I met at the bedside with patient and her daughter, Ann Sellers to discuss diagnosis prognosis, Ann Sellers, EOL wishes, disposition and options.  Ann Sellers is sitting up in the recliner. A&O x3. She denies pain or discomfort. Does complain of some shortness of breath at times. She is noticeably short of breath as she speaks for long periods, taking some rest breaks. Left hand and upper arm edema noted.   I introduced Palliative Medicine as specialized medical care for people living with serious illness. It focuses on providing relief from the symptoms and stress of a serious illness. The goal is to improve quality of life for both the patient and the family.  We discussed a brief life review of the patient, along with her functional and nutritional status. Daughter reports  patient lived in the home with patient's son prior to her recent illness. She was at [facility] facility for 3 days prior to this hospitalization for rehab. Ann Sellers reports she was a homemaker, wife, and mother of 6 children. She shares memories of growing up, as her mother passed away when she was [age] and she had to go into an [place] in [place] where she learned to be an excellent cook. She later left the orphanage after graduating high school. Her husband passed away over 30 years ago. 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 She spends times sharing memories of her impact and leadership in training young volunteers during her time here.   Prior to her recent illness, daughter shares patient had a great quality of life. She was able to perform all of her ADLs independently. She denies use of oxygen. She utilized a walker for gait stability at times. Her son would take her to most of her appointments.   We discussed Her current illness and what it means in the larger context of Her on-going co-morbidities. Daughter expressed concerns of patient's decline since being placed in a rehab facility and the lack of ability for family to be updated and involved due to [place].   I attempted to elicit values and goals of care important to the patient.    The difference between aggressive medical intervention and comfort care was considered in light of the patient's goals of care. Family is hopeful patient will show some improvement/stability and can return home with  home health and family support. Ann Sellers also verbalizes agreement.   Advanced directives, concepts specific to code status, artifical feeding and hydration, and rehospitalization were considered and discussed. Patient does have a documented advanced directive. Patient's son, Ann Sellers is her documented HCPOA.     I was able to call and speak with Ann Sellers over the phone and provide support. Discussed earlier conversations that took place with his sister and mother. He verbalized understanding and expressed he had card information that was left.    Palliative Care services outpatient were explained and offered. Patient and family verbalized their understanding and awareness of  palliative's goals and philosophy of care. Recommendations given for outpatient palliative support in the setting of family's expressed goals of continued full scope care and hopes patient would continue to improve. Ann Sellers expressed he would like to further discuss with his sisters and request follow up discussions tomorrow, as he is planning to meet with [facility] team currently regarding options in preparation for discharge.   Questions and concerns were addressed.  The family was encouraged to call with questions or concerns.  PMT will continue to support holistically.   SOCIAL HISTORY:     reports that she quit smoking about 32 years ago. Her smoking use included cigarettes. She has a 10.00 pack-year smoking history. She has never used smokeless tobacco. She reports current alcohol use of about 7.0 standard drinks of alcohol per week. She reports that she does not use drugs.  CODE STATUS: DNR  ADVANCE DIRECTIVES: Ann Sellers (Son)   SYMPTOM MANAGEMENT: per attending   Palliative Prophylaxis:   Aspiration, Delirium Protocol, Frequent Pain Assessment and Oral Care  PSYCHO-SOCIAL/SPIRITUAL:  Support System: Family  Desire for further Chaplaincy support: No   Additional Recommendations (Limitations, Scope, Preferences):  Full Scope Treatment   PAST MEDICAL HISTORY: Past Medical History:  Diagnosis Date  . Anemia   . Arthritis   . Asthma   . B12 deficiency   . Chronic diastolic CHF (congestive heart failure) ([place])   . CKD (chronic kidney disease), stage III   . Diabetes mellitus ([place])   . Hypertension   . Moderate  aortic stenosis   . Neuromuscular disorder ([place])   . PAF (paroxysmal atrial fibrillation) ([place])   . Right rotator cuff tear   . Wears dentures    top denture-bottom partial  . Wears glasses   . Wears hearing aid    both ears    PAST SURGICAL HISTORY:  Past Surgical History:  Procedure Laterality Date  . APPENDECTOMY  1945  . CATARACT EXTRACTION, BILATERAL  2008  . cervical stynosis  2005  . esophageal stretch  2001  . [place]  . herniated disc  1983  . left hand surgery  2011   gout  . left knee replacement  2002  . left knee surgery  1998  . right wrist fracture  2006  . SHOULDER ARTHROSCOPY WITH ROTATOR CUFF REPAIR Right 01/05/2013   Procedure: SHOULDER ARTHROSCOPY WITH ROTATOR CUFF REPAIR subchromial decompression and distal clavical excision;  Surgeon: Lorn Junes, MD;  Location: [place];  Service: Orthopedics;  Laterality: Right;  . tonsillectomy  1936  . TONSILLECTOMY    . TYMPANOSTOMY TUBE PLACEMENT  1989  . VESICOVAGINAL FISTULA CLOSURE W/ TAH  1968    ALLERGIES:  is allergic to losartan and snake antivenin [antivenin crotalidae polyvalent (snake antivenin)].   MEDICATIONS:  Current Facility-Administered Medications  Medication Dose Route Frequency Provider Last  Rate Last Admin  . acetaminophen (TYLENOL) tablet 650 mg  650 mg Oral Q6H PRN Chundi, Vahini, MD   650 mg at 01/10/20 1720   Or  . acetaminophen (TYLENOL) suppository 650 mg  650 mg Rectal Q6H PRN Lars Mage, MD   650 mg at 01/08/20 2039  . albuterol (PROVENTIL) (2.5 MG/3ML) 0.083% nebulizer solution 10 mg  10 mg Nebulization Once Lars Mage, MD   Stopped at 01/08/20 0234  . albuterol (PROVENTIL) (2.5 MG/3ML) 0.083% nebulizer solution 2.5 mg  2.5 mg Nebulization Q6H PRN Chundi, Vahini, MD      . allopurinol (ZYLOPRIM) tablet 50 mg  50 mg Oral QODAY Chundi, Vahini, MD   50 mg at 01/10/20 0911  . arformoterol (BROVANA) nebulizer solution 15 mcg  15 mcg  Nebulization BID Margaretha Seeds, MD   15 mcg at 01/11/20 1126  . aspirin chewable tablet 81 mg  81 mg Oral Daily Chundi, Verne Spurr, MD   81 mg at 01/11/20 7893  . budesonide (PULMICORT) nebulizer solution 0.25 mg  0.25 mg Nebulization BID Margaretha Seeds, MD   0.25 mg at 01/11/20 1126  . calcitRIOL (ROCALTROL) capsule 0.25 mcg  0.25 mcg Oral Daily Chundi, Verne Spurr, MD   0.25 mcg at 01/11/20 8101  . calcium acetate (PHOSLO) capsule 667 mg  667 mg Oral TID WC Chundi, Vahini, MD   667 mg at 01/11/20 1241  . Chlorhexidine Gluconate Cloth 2 % PADS 6 each  6 each Topical Daily Sid Falcon, MD   6 each at 01/11/20 [phone]  . gabapentin (NEURONTIN) tablet 300 mg  300 mg Oral BID Seawell, Jaimie A, DO   300 mg at 01/11/20 0821  . heparin injection 5,000 Units  5,000 Units Subcutaneous Q8H Chundi, Vahini, MD   5,000 Units at 01/11/20 1245  . insulin aspart (novoLOG) injection 0-15 Units  0-15 Units Subcutaneous TID WC Seawell, Jaimie A, DO      . iron polysaccharides (NIFEREX) capsule 150 mg  150 mg Oral Daily Chundi, Vahini, MD   150 mg at 01/11/20 2585  . lip balm (CARMEX) ointment   Topical PRN Christian, Rylee, MD      . metoprolol succinate (TOPROL-XL) 24 hr tablet 50 mg  50 mg Oral Daily Seawell, Jaimie A, DO   50 mg at 01/11/20 0821  . montelukast (SINGULAIR) tablet 10 mg  10 mg Oral QHS Chundi, Vahini, MD   10 mg at 01/10/20 2122  . pantoprazole (PROTONIX) EC tablet 40 mg  40 mg Oral Daily Chundi, Vahini, MD   40 mg at 01/11/20 0821  . predniSONE (DELTASONE) tablet 40 mg  40 mg Oral Q breakfast Seawell, Jaimie A, DO   40 mg at 01/11/20 0820  . promethazine (PHENERGAN) tablet 12.5 mg  12.5 mg Oral Q6H PRN Chundi, Vahini, MD      . senna-docusate (Senokot-S) tablet 1 tablet  1 tablet Oral QHS PRN Chundi, Vahini, MD      . sodium zirconium cyclosilicate (LOKELMA) packet 10 g  10 g Oral Daily Seawell, Jaimie A, DO   10 g at 01/11/20 0818    VITAL SIGNS: BP (!) 127/45   Pulse 65   Temp 98.4 F (36.9  C)   Resp 20   Wt 82.1 kg   SpO2 98%   BMI 35.35 kg/m  Filed Weights   01/09/20 0500 01/10/20 0300  Weight: 82.6 kg 82.1 kg    Estimated body mass index is 35.35 kg/m as calculated from the following:  Height as of 12/26/19: 5' (1.524 m).   Weight as of this encounter: 82.1 kg.  LABS: CBC:    Component Value Date/Time   WBC 7.7 01/10/2020 0558   HGB 9.0 (L) 01/10/2020 0558   HGB 11.3 11/17/2019 0918   HCT 29.9 (L) 01/10/2020 0558   HCT 35.4 11/17/2019 0918   PLT 181 01/10/2020 0558   PLT 223 11/17/2019 0918   Comprehensive Metabolic Panel:    Component Value Date/Time   NA 143 01/11/2020 0345   NA 145 (H) 12/15/2019 1117   K 4.1 01/11/2020 0345   CO2 34 (H) 01/11/2020 0345   BUN 76 (H) 01/11/2020 0345   BUN 44 (H) 12/15/2019 1117   CREATININE 2.28 (H) 01/11/2020 0345   ALBUMIN 3.2 (L) 01/07/2020 1947   ALBUMIN 4.1 11/17/2019 0918     Review of Systems  Constitutional: Positive for appetite change.  Respiratory: Positive for shortness of breath.   Neurological: Positive for weakness.   Physical Exam General: NAD, chronically-ill appearing Cardiovascular: regular rate and rhythm Pulmonary: some shortness of breath noted during long conversation, 1L/[place] Extremities: left hand and upper arm edema Skin: no rashes, scattered bruising  Neurological: awake, alert and oriented x3, mood appropriate   Prognosis: Guarded   Discharge Planning:  To Be Determined with recommendations for outpatient palliative at minimum.   Recommendations:  DNR-as confirmed   Continue with current plan of care per medical team  Family remains hopeful for some improvement/stability.   Recommend outpatient palliative at minimum. Son request to follow up tomorrow, allowing time for family to further discuss.   RN to elevate left arm. No redness or pain. Dependent edema   PMT will continue to support and follow.   Palliative Performance Scale: PPS 30%                Family  and patient expressed understanding and was in agreement with this plan.   Thank you for allowing the Palliative Medicine Team to assist in the care of this patient.  Time In: 1315 Time Out: 1420 Time Total: 65 min.   Visit consisted of counseling and education dealing with the complex and emotionally intense issues of symptom management and palliative care in the setting of serious and potentially life-threatening illness.Greater than 50%  of this time was spent counseling and coordinating care related to the above assessment and plan.  Signed by:  Alda Lea, AGPCNP-BC Palliative Medicine Team  Phone: [phone] Fax: [phone] Pager: [phone] Amion: Bjorn Pippin

## 2020-01-11 NOTE — Progress Notes (Signed)
NAME:  Ann Sellers, MRN:  [id number], DOB:  1925/06/28, LOS: 4 ADMISSION DATE:  01/07/2020  Subjective/Interm History  No overnight events. VSS Much more awake and alert this morning. Discussed working on discharge plans.  Objective   Blood pressure (!) 127/103, pulse 67, temperature 98.7 F (37.1 C), resp. rate 20, weight 82.1 kg, SpO2 100 %.     Intake/Output Summary (Last 24 hours) at 01/11/2020 0458 Last data filed at 01/11/2020 0435 Gross per 24 hour  Intake 934.33 ml  Output 1420 ml  Net -485.67 ml    Examination: GENERAL: chronically ill appearing female CARDIAC: RRR. Systolic murmur present PULMONARY:  Breathing comfortably on 2LNC. No adventitious lung sounds appreciated NEURO: much more awake and alert this morning. Answering questions and moving extremities.  Significant Diagnostic Tests:  3/19 CXR> cardiomegaly with vascular congestion and bilateral pleural effusions; persistent retrocardiac consolidation as seen on recent CT  Micro Data:  3/20 Urine culture>NGTD 3/21 Blood culture>NGTD  Labs    CBC Latest Ref Rng & Units 01/10/2020 01/09/2020 01/08/2020  WBC 4.0 - 10.5 K/uL 7.7 7.3 6.5  Hemoglobin 12.0 - 15.0 g/dL 9.0(L) 8.8(L) 8.1(L)  Hematocrit 36.0 - 46.0 % 29.9(L) 29.1(L) 28.7(L)  Platelets 150 - 400 K/uL 181 241 208   BMP Latest Ref Rng & Units 01/10/2020 01/09/2020 01/09/2020  Glucose 70 - 99 mg/dL 152(H) 176(H) 161(H)  BUN 8 - 23 mg/dL 67(H) 66(H) 64(H)  Creatinine 0.44 - 1.00 mg/dL 2.44(H) 2.39(H) 2.39(H)  BUN/Creat Ratio 12 - 28 - - -  Sodium 135 - 145 mmol/L 147(H) 145 148(H)  Potassium 3.5 - 5.1 mmol/L 4.8 5.4(H) 5.8(H)  Chloride 98 - 111 mmol/L 99 97(L) 99  CO2 22 - 32 mmol/L 35(H) 30 35(H)  Calcium 8.9 - 10.3 mg/dL 9.0 9.3 9.4    Summary  86 yo female with PMH of asthma, CKD IV who is presenting as a readmission from [facility] for progressive somulence over the past 2d and admitted to [facility] for acute on chronic respiratory  failure. Assessment & Plan:  Active Problems:   Acute respiratory failure (HCC)   Altered mental status  Acute hypercapnic respiratory failure with respiratory acidosis and metabolic alkalosis Unclear why she has began to require BiPAP over the recent weeks. Suspect this to be from asthma exacerbation related to her recent pna and lengthy recovery given her age and other comorbidities. Plan:  will continue prn BiPAP.  Continue prednisone 40mg  (day #4/5).  Continue singulair. brovana and pulmicort started 3/23. Prn albuterol nebs.  CKD IV. Improvement in renal function with IVF yesterday. Hypernatremia resolved Plan: continue lokelma daily. Calcitriol, phoslo tid  Chronic diastolic heart failure.   -0.5kg from admission Paroxysmal atrial fibrillation not on anticoagulation. Follows with Dr. Meda Coffee from cardiology.  Brief episode of afib last pm. Converted back into SR this morning with HR 60s-70s. Severe Aortic Stenosis. Echo obtained this admission shows progression of degree of stenosis. Plan Continue metoprolol xl 50mg  Daily weights. Tele monitoring.  Type II Diabetes Mellitus Blood sugars stable. Continue SSI.  Best practice:  CODE STATUS: DNR Diet: dysphagia 3 DVT for prophylaxis: lovenox Social considerations/Family communication: family does not wish for her to return to [facility]. TOC on board and working with them on this.  Dispo: suspect she should be stable for discharge in 1-2d pending bed availability. Would likely benefit from palliative and pulmonary follow up at discharge.  Patient's chronic respiratory failure due to asthma is life threatening.  Previous ABG's have documented high  PCO2 and spirometry reveals moderate obstructive ventilatory defect.  Patient would benefit from non-invasive ventilation.  Without this therapy, the patient is at high risk of ending up with worsening symptoms, worsened respiratory failure, need for ER visits and/or recurrent  hospitalizations.  Bilevel device unable to adequately support patient's nocturnal ventilation needs.  Patient would benefit from NIV therapy with set tidal volumes and pressure.   Mitzi Hansen, MD INTERNAL MEDICINE RESIDENT PGY-1 PAGER #: [phone] 01/11/20  4:58 AM

## 2020-01-11 NOTE — Progress Notes (Signed)
Physical Therapy Treatment Patient Details Name: Ann Sellers MRN: [id number] DOB: Dec 18, 1924 Today's Date: 01/11/2020    History of Present Illness Pt is a 86 y/o female, with a PMH of asthma, glaucoma, DM, HTN, PAF, CKD, who presents to [facility] with AMS. Grandaughter reports pt was discharged from [facility] on 3/16 after aspiration pneumonia and was d/ced to SNF for rehab on 2L of oxygen. Pt's family reports pt was at baseline (able to recognize family and talk over facetime), however she began to become lethargic and started forgetting family member's names.     PT Comments    Pt and pt's daughter report she is experiencing abdomen pain and trying to have a BM for the past 2 days. Pt willing to work with therapy however is requesting suppository if unable to have BM on commode. Pt is alert and oriented today. Pt is very HOH and only wears a hearing aid on her R ear. Pt also has visual deficits and tactile cues are used to help during transfers. Pt requires mod A x 2 supine to sit and transfers onto the commode via stand pivot mod A x2. Pt successfully has a BM and is very pleased. Pt transfers into the recliner using a RW with improved upright standing. She requires mod A x 2 and has difficultly fully pivoting her feet over the recliner thus recliner is brought behind her. Pt demonstrates significant weakness however today demonstrated improvements upon functional mobility. D/C plans remain appropriate. PT will continue to follow pt acutely.     Follow Up Recommendations  SNF;Supervision/Assistance - 24 hour     Equipment Recommendations  Other (comment)(TBD at next venue)       Precautions / Restrictions Precautions Precautions: Fall Precaution Comments: Pt with low vision. Restrictions Weight Bearing Restrictions: No    Mobility  Bed Mobility Overal bed mobility: Needs Assistance Bed Mobility: Supine to Sit     Supine to sit: Mod assist;+2 for physical assistance     General  bed mobility comments: Increased 1 step cues, increased time to initiate transfer, assistance with LEs and trunk   Transfers Overall transfer level: Needs assistance Equipment used: None;Rolling walker (2 wheeled) Transfers: Sit to/from [facility] Sit to Stand: Mod assist;+2 physical assistance Stand pivot transfers: Mod assist;+2 physical assistance       General transfer comment: Improved stand pivot transfer using RW; pt has difficulty moving her L LE when pivoting once fatigued  Ambulation/Gait             General Gait Details: unable at this time                 Balance Overall balance assessment: Needs assistance Sitting-balance support: Bilateral upper extremity supported;Feet supported Sitting balance-Leahy Scale: Poor Sitting balance - Comments: Pt heavily relies on UE support to maintain balance. Pt requires up to max A to maintain sitting EOB however once positioned appropriately, pt able to sit EOB with supervision.   Standing balance support: Bilateral upper extremity supported;During functional activity Standing balance-Leahy Scale: Zero Standing balance comment: Pt requires mod (x2) to max (x1) assist to maintain standing balance                            Cognition Arousal/Alertness: Awake/alert Behavior During Therapy: WFL for tasks assessed/performed Overall Cognitive Status: Within Functional Limits for tasks assessed Area of Impairment: Problem solving  Current Attention Level: Focused Memory: Decreased short-term memory Following Commands: Follows one step commands consistently;Follows one step commands with increased time   Awareness: Intellectual Problem Solving: Slow processing;Difficulty sequencing;Requires verbal cues;Decreased initiation General Comments: Pt's cognition is much better today. She is alert and oriented and able to answer questions and follow 1 step commands. Increased  time to process and need to raise voice (specifically speak into R ear due to [facility]).       Exercises Other Exercises Other Exercises: pursed lip breathing Other Exercises: sitting EOB x 5 minutes with supervision to max A initially  Other Exercises: stand pivot x 2 to commode and to recliner    General Comments General comments (skin integrity, edema, etc.): Pt is on 2L of O2. Resting SpO2 is 92%. SpO2 briefly drops to 88% during functional mobility however increased into the 90s with pursed lip breathing cues.      Pertinent Vitals/Pain Pain Assessment: Faces Faces Pain Scale: Hurts little more Pain Location: abdomen  Pain Descriptors / Indicators: Discomfort Pain Intervention(s): Limited activity within patient's tolerance;Monitored during session;Repositioned           PT Goals (current goals can now be found in the care plan section) Acute Rehab PT Goals Patient Stated Goal: to have a BM PT Goal Formulation: With patient/family Time For Goal Achievement: 01/22/20 Potential to Achieve Goals: Fair Progress towards PT goals: Progressing toward goals    Frequency    Min 2X/week      PT Plan Current plan remains appropriate       AM-PAC PT "6 Clicks" Mobility   Outcome Measure  Help needed turning from your back to your side while in a flat bed without using bedrails?: A Lot Help needed moving from lying on your back to sitting on the side of a flat bed without using bedrails?: A Lot Help needed moving to and from a bed to a chair (including a wheelchair)?: A Lot Help needed standing up from a chair using your arms (e.g., wheelchair or bedside chair)?: A Lot Help needed to walk in hospital room?: Total Help needed climbing 3-5 steps with a railing? : Total 6 Click Score: 10    End of Session Equipment Utilized During Treatment: Gait belt;Oxygen Activity Tolerance: Patient limited by fatigue Patient left: in chair;with call bell/phone within reach;with chair  alarm set;with nursing/sitter in room Nurse Communication: Mobility status PT Visit Diagnosis: Unsteadiness on feet (R26.81);Other abnormalities of gait and mobility (R26.89);Muscle weakness (generalized) (M62.81);History of falling (Z91.81);Difficulty in walking, not elsewhere classified (R26.2)     Time: 9628-3662 PT Time Calculation (min) (ACUTE ONLY): 39 min  Charges:  $Therapeutic Activity: 23-37 mins $Neuromuscular Re-education: 8-22 mins                     Jodelle Green, PT, DPT Acute Rehabilitation Services Office [phone]    Jodelle Green 01/11/2020, 2:54 PM

## 2020-01-12 LAB — BASIC METABOLIC PANEL
Anion gap: 12 (ref 5–15)
BUN: 77 mg/dL — ABNORMAL HIGH (ref 8–23)
CO2: 34 mmol/L — ABNORMAL HIGH (ref 22–32)
Calcium: 8.6 mg/dL — ABNORMAL LOW (ref 8.9–10.3)
Chloride: 99 mmol/L (ref 98–111)
Creatinine, Ser: 2.23 mg/dL — ABNORMAL HIGH (ref 0.44–1.00)
GFR calc Af Amer: 21 mL/min — ABNORMAL LOW (ref >60)
GFR calc non Af Amer: 18 mL/min — ABNORMAL LOW (ref >60)
Glucose, Bld: 166 mg/dL — ABNORMAL HIGH (ref 70–99)
Potassium: 4.1 mmol/L (ref 3.5–5.1)
Sodium: 145 mmol/L (ref 135–145)

## 2020-01-12 LAB — GLUCOSE, CAPILLARY
Glucose-Capillary: 126 mg/dL — ABNORMAL HIGH (ref 70–99)
Glucose-Capillary: 151 mg/dL — ABNORMAL HIGH (ref 70–99)
Glucose-Capillary: 262 mg/dL — ABNORMAL HIGH (ref 70–99)
Glucose-Capillary: 263 mg/dL — ABNORMAL HIGH (ref 70–99)

## 2020-01-12 MED ORDER — INSULIN GLARGINE 100 UNIT/ML ~~LOC~~ SOLN
10.0000 [IU] | Freq: Every day | SUBCUTANEOUS | Status: DC
  Administered 2020-01-12 – 2020-01-17 (×6): 10 [IU] via SUBCUTANEOUS
  Filled 2020-01-12 (×7): qty 0.1

## 2020-01-12 MED ORDER — SALINE SPRAY 0.65 % NA SOLN
1.0000 | NASAL | Status: DC | PRN
  Administered 2020-01-12 (×2): 1 via NASAL
  Filled 2020-01-12: qty 44

## 2020-01-12 MED ORDER — DIPHENOXYLATE-ATROPINE 2.5-0.025 MG PO TABS: 1.0000 | ORAL_TABLET | Freq: Every day | ORAL | Status: DC | PRN

## 2020-01-12 NOTE — Progress Notes (Signed)
UPDATE NOTE: Spoke with patient and her son, Ann Sellers, regarding hospital discharge. Ann Sellers is just overall reluctant for her to return to [facility]. He brought up the idea of her returning home. I strongly discouraged that given her increased needs for mobility and being high fall risk. Both he and the patient expressed understanding and implied they would not want a fall to occur either. Discussed how she likely needs a few weeks or months of rehab to regain her prior indepence.  We also discussed the concern of them appealing her discharge. He notes that he had brought that idea up primarily in order for her to get a few more days of therapy in the hospital and get her stronger prior to going home. I relayed that it would not be an appropriate reason for her to stay but that will keep her today to recheck her potassium in the morning prior to discharging. He expressed understanding and we will chat again tomorrow.

## 2020-01-12 NOTE — Plan of Care (Signed)

## 2020-01-12 NOTE — Progress Notes (Signed)
  Speech Language Pathology Treatment: Dysphagia  Patient Details Name: Ann Sellers MRN: [id number] DOB: 26-Oct-1924 Today's Date: 01/12/2020 Time: 9833-8250 SLP Time Calculation (min) (ACUTE ONLY): 27 min  Assessment / Plan / Recommendation Clinical Impression  Pt reports poor intake due to her displeasure with po intake.  She did not pass a 3 ounce yale water test due to requiring rest break during intake; No cough associated with po of pancakes, juice; cough x1 without coorelation to po noted; pt reports since she had whooping cough as a child, She has suffered with a chronic cough; pt states her swallow function currently is normal and admits to diffiuculty with viscous breads and meats; Recommend advance to regular/thin diet to allow pt to choose foods she can tolerate better but allow her more choices.  Pt educated and agreeable to plan; no SLP follow up indicated.      HPI HPI: Pt is a 86 year old woman with PMH of Asthma, DM, HTN, Afib, CKD who presented from SNF for encephalopathy. CWXR 3/19: Persistent retrocardiac consolidation. CT of the head 3/11: no acute findings. WBC WNL and pt afebrile at time of eval. Pt seen by SLP during prior admission 3/8-3/12 with final recommendation of regular diet & thin liquids.       SLP Plan  All goals met       Recommendations  Diet recommendations: Regular;Thin liquid Liquids provided via: Cup;Straw Medication Administration: Whole meds with puree(as tolerated) Supervision: Staff to assist with self feeding;Full supervision/cueing for compensatory strategies;Trained caregiver to feed patient Compensations: Minimize environmental distractions;Slow rate;Small sips/bites Postural Changes and/or Swallow Maneuvers: Seated upright 90 degrees                Oral Care Recommendations: Oral care BID;Staff/trained caregiver to provide oral care Follow up Recommendations: Other (comment)(TBD) SLP Visit Diagnosis: Dysphagia, unspecified  (R13.10) Plan: All goals met       GO                Ann Sellers 01/12/2020, 9:19 AM   Ann Lime, MS [place] Office [phone]

## 2020-01-12 NOTE — Plan of Care (Signed)
  Problem: Education: Goal: Knowledge of General Education information will improve Description: Including pain rating scale, medication(s)/side effects and non-pharmacologic comfort measures Outcome: Progressing   Problem: Health Behavior/Discharge Planning: Goal: Ability to manage health-related needs will improve Outcome: Progressing   Problem: Clinical Measurements: Goal: Ability to maintain clinical measurements within normal limits will improve Outcome: Progressing Goal: Will remain free from infection Outcome: Progressing Goal: Diagnostic test results will improve Outcome: Progressing   Problem: Activity: Goal: Risk for activity intolerance will decrease Outcome: Progressing   Problem: Nutrition: Goal: Adequate nutrition will be maintained Outcome: Progressing Note: Diet increased   Problem: Elimination: Goal: Will not experience complications related to bowel motility Outcome: Progressing Goal: Will not experience complications related to urinary retention Outcome: Progressing

## 2020-01-12 NOTE — Progress Notes (Addendum)
NAME:  Ann Sellers, MRN:  [id number], DOB:  1925/01/11, LOS: 5 ADMISSION DATE:  01/07/2020  Subjective/Interm History  No overnight events. VSS Per case management, family not wishing for pt to discharge due to their wishes for continued bed placement options review. They are considering discharge appeal. Discussed discharge plans this morning. Patient notes that she would like to go home. We discussed safety issues related to this and how a fall at home could result in her requiring permanent SNF placement. She relayed understanding at this.   Pt also endorsing diarrhea since last afternoon. Notes this is a chronic issue for which she takes lomotil at home   Objective   Blood pressure (!) 123/48, pulse 65, temperature 98.5 F (36.9 C), resp. rate (!) 21, weight 82.1 kg, SpO2 96 %.     Intake/Output Summary (Last 24 hours) at 01/12/2020 0504 Last data filed at 01/12/2020 0200 Gross per 24 hour  Intake 220 ml  Output 100 ml  Net 120 ml    Examination: GENERAL: chronically ill appearing female CARDIAC: RRR. Systolic murmur present PULMONARY:  Breathing comfortably on 2LNC. No adventitious lung sounds appreciated  NEURO: a/ox3. Moving all extremities. Cognition intact.  Significant Diagnostic Tests:  3/19 CXR> cardiomegaly with vascular congestion and bilateral pleural effusions; persistent retrocardiac consolidation as seen on recent CT  Labs    CBC Latest Ref Rng & Units 01/10/2020 01/09/2020 01/08/2020  WBC 4.0 - 10.5 K/uL 7.7 7.3 6.5  Hemoglobin 12.0 - 15.0 g/dL 9.0(L) 8.8(L) 8.1(L)  Hematocrit 36.0 - 46.0 % 29.9(L) 29.1(L) 28.7(L)  Platelets 150 - 400 K/uL 181 241 208   BMP Latest Ref Rng & Units 01/12/2020 01/11/2020 01/10/2020  Glucose 70 - 99 mg/dL 166(H) 154(H) 152(H)  BUN 8 - 23 mg/dL 77(H) 76(H) 67(H)  Creatinine 0.44 - 1.00 mg/dL 2.23(H) 2.28(H) 2.44(H)  BUN/Creat Ratio 12 - 28 - - -  Sodium 135 - 145 mmol/L 145 143 147(H)  Potassium 3.5 - 5.1 mmol/L 4.1 4.1 4.8    Chloride 98 - 111 mmol/L 99 100 99  CO2 22 - 32 mmol/L 34(H) 34(H) 35(H)  Calcium 8.9 - 10.3 mg/dL 8.6(L) 8.5(L) 9.0    Summary  86 yo female with PMH of asthma, CKD IV who is presenting as a readmission from [facility] for progressive somulence over the past 2d and admitted to [facility] for acute on chronic respiratory failure. Assessment & Plan:  Active Problems:   Acute respiratory failure (HCC)   Altered mental status  Acute hypercapnic respiratory failure with respiratory acidosis and metabolic alkalosis Unclear why she has began to require BiPAP over the recent weeks. Suspect this to be from asthma exacerbation related to her recent pna and lengthy recovery given her age and other comorbidities. Back to her 2L supplemental O2 requirement from previous discharge Plan:  will continue prn BiPAP.  Continue prednisone 40mg  (day #5/5).   Continue singulair. brovana and pulmicort started 3/23. Prn albuterol nebs.  CKD IV. Improvement in renal function with IVF yesterday. Hypernatremia resolved Plan: holding lokelma due to diarrhea. Would consider switching to a diff potassium binder to avoid the higher sodium levels associated with lokelma--800mg /day at her current dose. Continue Calcitriol, phoslo tid  Chronic diastolic heart failure.  Stable. Paroxysmal atrial fibrillation not on anticoagulation. Follows with Dr. Meda Coffee from cardiology.  Severe Aortic Stenosis. Echo obtained this admission shows progression of degree of stenosis. Plan Continue metoprolol xl 50mg  Daily weights. Tele monitoring.  Type II Diabetes Mellitus Blood sugars stable.  Continue SSI. Will add 10U lantus daily and continue to monitor.  Irritable bowel syndrome. Pt notes issues with diarrhea last evening and today. This is a chronic issue that most likely is being exacerbated by recent addition of daily lokelma and having not received her home dose lomotil since admission. She denies abdominal pain and no fever or  leukocytosis. Plan: holding lokelma. Will restart her home lomotil dose and continue to monitor.  Best practice:  CODE STATUS: DNR Diet: dysphagia 3 DVT for prophylaxis: lovenox Social considerations/Family communication: family does not wish for her to return to [facility]. TOC on board and working with them on this.  Dispo: suspect she should be stable for discharge in 1-2d pending bed availability. Would likely benefit from palliative and pulmonary follow up at discharge.  Patient's chronic respiratory failure due to asthma is life threatening.  Previous ABG's have documented high PCO2 and spirometry reveals moderate obstructive ventilatory defect.  Patient would benefit from non-invasive ventilation.  Without this therapy, the patient is at high risk of ending up with worsening symptoms, worsened respiratory failure, need for ER visits and/or recurrent hospitalizations.  Bilevel device unable to adequately support patient's nocturnal ventilation needs.  Patient would benefit from NIV therapy with set tidal volumes and pressure.   Mitzi Hansen, MD INTERNAL MEDICINE RESIDENT PGY-1 PAGER #: [phone] 01/12/20  5:04 AM

## 2020-01-13 LAB — BASIC METABOLIC PANEL
Anion gap: 8 (ref 5–15)
BUN: 75 mg/dL — ABNORMAL HIGH (ref 8–23)
CO2: 35 mmol/L — ABNORMAL HIGH (ref 22–32)
Calcium: 8.6 mg/dL — ABNORMAL LOW (ref 8.9–10.3)
Chloride: 100 mmol/L (ref 98–111)
Creatinine, Ser: 2.02 mg/dL — ABNORMAL HIGH (ref 0.44–1.00)
GFR calc Af Amer: 24 mL/min — ABNORMAL LOW (ref >60)
GFR calc non Af Amer: 21 mL/min — ABNORMAL LOW (ref >60)
Glucose, Bld: 173 mg/dL — ABNORMAL HIGH (ref 70–99)
Potassium: 4.4 mmol/L (ref 3.5–5.1)
Sodium: 143 mmol/L (ref 135–145)

## 2020-01-13 LAB — CBC WITH DIFFERENTIAL/PLATELET
Abs Immature Granulocytes: 0.06 10*3/uL (ref 0.00–0.07)
Basophils Absolute: 0 10*3/uL (ref 0.0–0.1)
Basophils Relative: 0 %
Eosinophils Absolute: 0 10*3/uL (ref 0.0–0.5)
Eosinophils Relative: 1 %
HCT: 25.5 % — ABNORMAL LOW (ref 36.0–46.0)
Hemoglobin: 7.7 g/dL — ABNORMAL LOW (ref 12.0–15.0)
Immature Granulocytes: 1 %
Lymphocytes Relative: 8 %
Lymphs Abs: 0.5 10*3/uL — ABNORMAL LOW (ref 0.7–4.0)
MCH: 30.4 pg (ref 26.0–34.0)
MCHC: 30.2 g/dL (ref 30.0–36.0)
MCV: 100.8 fL — ABNORMAL HIGH (ref 80.0–100.0)
Monocytes Absolute: 0.3 10*3/uL (ref 0.1–1.0)
Monocytes Relative: 5 %
Neutro Abs: 5.6 10*3/uL (ref 1.7–7.7)
Neutrophils Relative %: 85 %
Platelets: 136 10*3/uL — ABNORMAL LOW (ref 150–400)
RBC: 2.53 MIL/uL — ABNORMAL LOW (ref 3.87–5.11)
RDW: 14.4 % (ref 11.5–15.5)
WBC: 6.5 10*3/uL (ref 4.0–10.5)
nRBC: 0 % (ref 0.0–0.2)

## 2020-01-13 LAB — GLUCOSE, CAPILLARY
Glucose-Capillary: 121 mg/dL — ABNORMAL HIGH (ref 70–99)
Glucose-Capillary: 135 mg/dL — ABNORMAL HIGH (ref 70–99)
Glucose-Capillary: 135 mg/dL — ABNORMAL HIGH (ref 70–99)
Glucose-Capillary: 89 mg/dL (ref 70–99)

## 2020-01-13 MED ORDER — PATIROMER SORBITEX CALCIUM 8.4 G PO PACK: 8.4000 g | PACK | Freq: Every day | ORAL | 0 refills | Status: AC

## 2020-01-13 MED ORDER — PATIROMER SORBITEX CALCIUM 8.4 G PO PACK
8.4000 g | PACK | Freq: Every day | ORAL | Status: DC
  Administered 2020-01-13 – 2020-01-17 (×4): 8.4 g via ORAL
  Filled 2020-01-13 (×6): qty 1

## 2020-01-13 MED ORDER — MOMETASONE FURO-FORMOTEROL FUM 200-5 MCG/ACT IN AERO: 2.0000 | INHALATION_SPRAY | Freq: Two times a day (BID) | RESPIRATORY_TRACT | 0 refills | Status: AC

## 2020-01-13 NOTE — Progress Notes (Addendum)
NAME:  Ann Sellers, MRN:  [id number], DOB:  September 07, 1925, LOS: 6 ADMISSION DATE:  01/07/2020  Subjective/Interm History  No overnight events. VSS Discussed plan for discharge today with her son Ann Sellers. After our discussion yesterday, I think he is grasping her high degree of fall risk if she returns home. He relayed that they are fairly confident that they want her to go to [facility] for rehab.  Objective   Blood pressure (!) 125/55, pulse 79, temperature 98.4 F (36.9 C), temperature source Oral, resp. rate 18, weight 82.1 kg, SpO2 100 %.     Intake/Output Summary (Last 24 hours) at 01/13/2020 0434 Last data filed at 01/12/2020 1738 Gross per 24 hour  Intake 540 ml  Output 2500 ml  Net -1960 ml    Examination: GENERAL: chronically ill appearing female CARDIAC: minimal peripheral edema. Tissues well perfused. PULMONARY: breathing comfortably on 2L [place]. No gross wheezes. Skin: dressing on bridge of nose.  Significant Diagnostic Tests:  3/19 CXR> cardiomegaly with vascular congestion and bilateral pleural effusions; persistent retrocardiac consolidation as seen on recent CT  Labs    CBC Latest Ref Rng & Units 01/13/2020 01/10/2020 01/09/2020  WBC 4.0 - 10.5 K/uL 6.5 7.7 7.3  Hemoglobin 12.0 - 15.0 g/dL 7.7(L) 9.0(L) 8.8(L)  Hematocrit 36.0 - 46.0 % 25.5(L) 29.9(L) 29.1(L)  Platelets 150 - 400 K/uL 136(L) 181 241   BMP Latest Ref Rng & Units 01/13/2020 01/12/2020 01/11/2020  Glucose 70 - 99 mg/dL 173(H) 166(H) 154(H)  BUN 8 - 23 mg/dL 75(H) 77(H) 76(H)  Creatinine 0.44 - 1.00 mg/dL 2.02(H) 2.23(H) 2.28(H)  BUN/Creat Ratio 12 - 28 - - -  Sodium 135 - 145 mmol/L 143 145 143  Potassium 3.5 - 5.1 mmol/L 4.4 4.1 4.1  Chloride 98 - 111 mmol/L 100 99 100  CO2 22 - 32 mmol/L 35(H) 34(H) 34(H)  Calcium 8.9 - 10.3 mg/dL 8.6(L) 8.6(L) 8.5(L)    Summary  86 yo female with PMH of asthma, CKD IV who is presenting as a readmission from [facility] for progressive somulence over the past 2d and  admitted to [facility] for acute on chronic respiratory failure. Assessment & Plan:  Active Problems:   Acute respiratory failure (HCC)   Altered mental status  RESOLVED Acute hypercapnic respiratory failure due to asthma exacerbation 5d course of prednisone complete. Remains on 2L [place] Plan:  Stable for discharge. Will discharge with prn BiPAP and continuous 2L O2 Continue singulair. brovana and pulmicort started 3/23. Prn albuterol nebs.  CKD IV. Improvement in renal function with IVF yesterday. Hypernatremia resolved Plan: will restart K binder with valtessa. Continue Calcitriol, phoslo tid  Chronic diastolic heart failure.  Stable. Paroxysmal atrial fibrillation not on anticoagulation. Follows with Dr. Meda Sellers from cardiology. In SR at this time. Severe Aortic Stenosis. Echo obtained this admission shows progression of degree of stenosis. Plan Continue metoprolol xl 50mg  Daily weights. Tele monitoring.  Type II Diabetes Mellitus Blood sugars stable. Continue SSI + 10U lantus hs Irritable bowel syndrome.  Plan: Will restart her home lomotil dose and continue to monitor.  Best practice:  CODE STATUS: DNR Diet: regular DVT for prophylaxis: lovenox Social considerations/Family communication:see yesterday's note for discussion with son Dispo: stable for discharge pending bed placement. CM updated.  Patient's chronic respiratory failure due to asthma is life threatening.  Previous ABG's have documented high PCO2 and spirometry reveals moderate obstructive ventilatory defect.  Patient would benefit from non-invasive ventilation.  Without this therapy, the patient is at high risk of  ending up with worsening symptoms, worsened respiratory failure, need for ER visits and/or recurrent hospitalizations.  Bilevel device unable to adequately support patient's nocturnal ventilation needs.  Patient would benefit from NIV therapy with set tidal volumes and pressure.   Mitzi Hansen, MD INTERNAL  MEDICINE RESIDENT PGY-1 PAGER #: [phone] 01/13/20  4:34 AM

## 2020-01-13 NOTE — Discharge Summary (Signed)
Name: Ann Sellers MRN: [id number] DOB: 14-Dec-1924 86 y.o. PCP: Reynold Bowen, MD  Date of Admission: 01/07/2020  6:05 PM Date of Discharge: 01/17/20 Attending Physician: Lucious Groves, DO  Discharge Diagnosis: 1. Acute on Chronic hypercarbic Respiratory Failure 2. Acute Asthma Exacerbation 3. Hyperkalemia 4. Paroxysmal Afib 5. HFpEF w/severe aortic stenosis 6. AKI on CKD Stage III  Discharge Medications: Allergies as of 01/17/2020      Reactions   Losartan Cough   Snake Antivenin [antivenin Crotalidae Polyvalent (snake Antivenin)] Other (See Comments)   PARALYSIS      Medication List    STOP taking these medications   furosemide 40 MG tablet Commonly known as: LASIX   mometasone-formoterol 100-5 MCG/ACT Aero Commonly known as: DULERA Replaced by: mometasone-formoterol 200-5 MCG/ACT Aero   sodium bicarbonate 650 MG tablet   sodium zirconium cyclosilicate 10 g Pack packet Commonly known as: LOKELMA     TAKE these medications   albuterol (2.5 MG/3ML) 0.083% nebulizer solution Commonly known as: PROVENTIL Take 2.5 mg by nebulization every 6 (six) hours as needed for shortness of breath.   albuterol 108 (90 Base) MCG/ACT inhaler Commonly known as: VENTOLIN HFA Inhale 2 puffs into the lungs every 6 (six) hours as needed for wheezing or shortness of breath.   allopurinol 100 MG tablet Commonly known as: ZYLOPRIM Take 0.5 tablets (50 mg total) by mouth every other day.   amLODipine 5 MG tablet Commonly known as: NORVASC Take 1 tablet (5 mg total) by mouth daily.   aspirin 81 MG chewable tablet Chew 1 tablet (81 mg total) by mouth daily.   calcitRIOL 0.25 MCG capsule Commonly known as: ROCALTROL Take 0.25 mcg by mouth daily. What changed: Another medication with the same name was removed. Continue taking this medication, and follow the directions you see here.   calcium acetate 667 MG capsule Commonly known as: PhosLo Take 1 capsule (667 mg total) by  mouth 3 (three) times daily with meals.   diclofenac sodium 1 % Gel Commonly known as: VOLTAREN Apply 2 g topically daily as needed (pain).   diphenoxylate-atropine 2.5-0.025 MG tablet Commonly known as: LOMOTIL Take 1 tablet by mouth daily as needed for diarrhea or loose stools.   fluticasone 50 MCG/ACT nasal spray Commonly known as: FLONASE Place 2 sprays into both nostrils daily.   gabapentin 600 MG tablet Commonly known as: NEURONTIN Take 0.5 tablets (300 mg total) by mouth 2 (two) times daily.   HYDROcodone-acetaminophen 5-325 MG tablet Commonly known as: NORCO/VICODIN Take 0.5-1 tablets by mouth every 6 (six) hours as needed for moderate pain or severe pain.   iron polysaccharides 150 MG capsule Commonly known as: NIFEREX Take 150 mg by mouth daily.   loratadine 10 MG tablet Commonly known as: CLARITIN Take 1 tablet (10 mg total) by mouth daily.   metoprolol succinate 50 MG 24 hr tablet Commonly known as: TOPROL-XL TAKE 1 TABLET BY MOUTH EVERY DAY WITH FOOD OR IMMEDIATELY AFTER EATING What changed: See the new instructions.   mometasone-formoterol 200-5 MCG/ACT Aero Commonly known as: DULERA Inhale 2 puffs into the lungs in the morning and at bedtime. Replaces: mometasone-formoterol 100-5 MCG/ACT Aero   montelukast 10 MG tablet Commonly known as: SINGULAIR Take 10 mg by mouth at bedtime.   omeprazole 20 MG capsule Commonly known as: PRILOSEC Take 20 mg by mouth at bedtime.   patiromer 8.4 g packet Commonly known as: VELTASSA Take 1 packet (8.4 g total) by mouth daily.   senna-docusate 8.6-50  MG tablet Commonly known as: Senokot-S Take 1 tablet by mouth at bedtime as needed for mild constipation.   sodium chloride 0.65 % Soln nasal spray Commonly known as: OCEAN Place 1 spray into both nostrils as needed for congestion.   tiZANidine 4 MG tablet Commonly known as: ZANAFLEX Take 4 mg by mouth every 8 (eight) hours as needed for muscle spasms.    Vitamin D (Ergocalciferol) 1.25 MG (50000 UNIT) Caps capsule Commonly known as: DRISDOL Take 50,000 Units by mouth every Friday.            Durable Medical Equipment  (From admission, onward)         Start     Ordered   01/11/20 1409  For home use only DME Bipap  Once    Question:  Length of Need  Answer:  Lifetime   01/11/20 1408          Disposition and follow-up:   Ms.Brennan L Karnes was discharged from [facility] in Stable condition.  At the hospital follow up visit please address:  1.  Acute on Chronic Hypercarbic Respiratory Failure: possibly 2/2 decompensation due to recent aspiration pneumonia with history of asthma. Treated 5 days of prednisone. Required bipap at night, discharged with bipap prn and high dose dulera 2. Acute Asthma Exacerbation: treated with 5 days of prednisone.  3. Hyperkalemia: treat with veltassa daily. Will need to monitor for hypokalemia, may be able to switch to every other day.  4. AKI on CKD Stage III: With chronic elevated bicarb 2/2 respiratory failure she may no longer need po bicarb. Reassess on labs. AKI improved with fluids.  5. Paroxysmal Afib: continue home toprol  6. HFpEF with severe aortic stenosis:  Did not require her home 40mg  lasix dose during hospitalization. Re-evaluate at time of hospital follow up.  2.  Labs / imaging needed at time of follow-up: BMP  3.  Pending labs/ test needing follow-up: none  Follow-up Appointments: Contact information for after-discharge care    Destination    [facility] Preferred SNF .   Service: Skilled Nursing Contact information: [address] [place] [facility] Course by problem list: Hypercarbic respiratory failure due to Acute Asthma Exacerbation Vida Nicol is a very pleasant 86yo female with PMH CKD Stage III, severe aortic stenosis, hypertension, asthma, hyperkalemia, TIIDM, and recent  hospital admission for aspiration pneumonia who was admitted from SNF with AMS, found to be in acute on chronic  hypercarbic respiratory failure. She was treated with bipap with resolution of her hypercarbia and improvement in mentation. She has a history of asthma for which she no longer takes inhalers. During last admission she was discharged with dulera. She was treated with prednisone 5 days, pulmicort, and brovana. She was discharged with increase in her dulera to high dose. She also will need bipap prn in the outpatient setting as she has required this frequently at night.   Hyperkalemia CKD Stage III Secondary to her CKD. She was previously on lokelma daily. This was switched to veltassa to decrease sodium intake and lower extremity edema. She initially had AKI at admission secondary to decreased po intake. Symptoms improved to baseline CKD with fluid resuscitation.   HFpEF Severe Aortic Stenosis Repeat echo showed normal EF with increase in AS from mod-severe to severe. She did not require diuresis with her home lasix during admission.    Discharge  Vitals:   BP (!) 135/50 (BP Location: Right Arm)   Pulse (!) 58   Temp 98 F (36.7 C)   Resp 20   Wt 83.9 kg   SpO2 98%   BMI 36.12 kg/m   Pertinent Labs, Studies, and Procedures:  CBC Latest Ref Rng & Units 01/15/2020 01/13/2020 01/10/2020  WBC 4.0 - 10.5 K/uL 7.3 6.5 7.7  Hemoglobin 12.0 - 15.0 g/dL 8.7(L) 7.7(L) 9.0(L)  Hematocrit 36.0 - 46.0 % 29.8(L) 25.5(L) 29.9(L)  Platelets 150 - 400 K/uL 143(L) 136(L) 181   BMP Latest Ref Rng & Units 01/15/2020 01/13/2020 01/12/2020  Glucose 70 - 99 mg/dL 94 173(H) 166(H)  BUN 8 - 23 mg/dL 56(H) 75(H) 77(H)  Creatinine 0.44 - 1.00 mg/dL 1.79(H) 2.02(H) 2.23(H)  BUN/Creat Ratio 12 - 28 - - -  Sodium 135 - 145 mmol/L 147(H) 143 145  Potassium 3.5 - 5.1 mmol/L 4.7 4.4 4.1  Chloride 98 - 111 mmol/L 103 100 99  CO2 22 - 32 mmol/L 36(H) 35(H) 34(H)  Calcium 8.9 - 10.3 mg/dL 9.3 8.6(L) 8.6(L)      Echo 01/08/20 1. Listed as STAT study, but not entered as such and no reading provider  notified.  2. Left ventricular ejection fraction, by estimation, is 65 to 70%. The  left ventricle has normal function. The left ventricle has no regional  wall motion abnormalities. There is moderate left ventricular hypertrophy.  Left ventricular diastolic  parameters are consistent with Grade I diastolic dysfunction (impaired  relaxation).  3. Right ventricular systolic function is normal. The right ventricular  size is normal. Tricuspid regurgitation signal is inadequate for assessing  PA pressure.  4. Left atrial size was mildly dilated.  5. The mitral valve is degenerative with moderate annular calcification.  Mild mitral valve regurgitation.  6. The aortic valve is tricuspid, moderately to severely calcified.  Aortic valve regurgitation is not visualized. Severe aortic valve  stenosis. Aortic valve area, by VTI measures 0.68 cm. Aortic valve mean  gradient measures 34.5 mmHg. Aortic valve Vmax  measures 4.00 m/s. Dimentionless index 0.24. Fairly similar to prior  study gradients from July 2020, but current parameters more consistent  with severe stenosis.  7. The inferior vena cava is normal in size with <50% respiratory  variability, suggesting right atrial pressure of 8 mmHg.   Discharge Instructions: Patient's chronic respiratory failure due to asthma is life threatening.  Previous ABG's have documented high PCO2 and spirometry reveals moderate obstructive ventilatory defect.  Patient would benefit from non-invasive ventilation.  Without this therapy, the patient is at high risk of ending up with worsening symptoms, worsened respiratory failure, need for ER visits and/or recurrent hospitalizations.  Bilevel device unable to adequately support patient's nocturnal ventilation needs.  Patient would benefit from NIV therapy with set tidal volumes and pressure.  mask size medium- set rate  12- resp rate 20- IPAP 15- EPAP 8- minute vent 7.7- Leak 0- Peak Inspiratory pressure15- Tidal volume(Vt) 287- Auto titrate no- Pressure High alarm 25- Pressure low alarm 5 .     Signed: Mitzi Hansen, MD 01/17/2020, 11:39 AM   Pager: [phone]

## 2020-01-13 NOTE — Progress Notes (Signed)
Physical Therapy Treatment Patient Details Name: Ann Sellers MRN: [id number] DOB: [birth year] Today's Date: 01/13/2020    History of Present Illness Pt is a 86 y/o female, with a PMH of asthma, glaucoma, DM, HTN, PAF, CKD, who presents to [facility] with AMS. Grandaughter reports pt was discharged from [facility] on 3/16 after aspiration pneumonia and was d/ced to SNF for rehab on 2L of oxygen. Pt's family reports pt was at baseline (able to recognize family and talk over facetime), however she began to become lethargic and started forgetting family member's names.     PT Comments    Pt's son is present throughout therapy and curious about her progress. Pt has been making slow, steady improvements with functional mobility with her biggest limitation being ambulation. Pt has improved sitting balance at EOB and recommended to pt's family and nursing for pt to be upright as much as possible, especially when eating meals. Pt is able to stand at [facility] with mod A x 2 however has difficulty weight shifting when trying to step. She requires assistance to weight shift and relies heavily on UE support. 2 days ago she reports her left foot gets "stuck" however today pt's right foot was having difficulty moving. Pt presents with generalized weakness and functional mobility deficits most appropriate for SNF at discharge. PT will continue to follow acutely.    Follow Up Recommendations  SNF;Supervision/Assistance - 24 hour     Equipment Recommendations  Other (comment)(defer to next venue)       Precautions / Restrictions Precautions Precautions: Fall Precaution Comments: Pt with low vision and very HOH. Restrictions Weight Bearing Restrictions: No    Mobility  Bed Mobility Overal bed mobility: Needs Assistance Bed Mobility: Supine to Sit Rolling: Min guard         General bed mobility comments: min guard for safety/ steadying, increased time and cues to complete  Transfers Overall transfer level:  Needs assistance Equipment used: Rolling walker (2 wheeled) Transfers: Sit to/from [facility] Sit to Stand: Mod assist;+2 physical assistance Stand pivot transfers: Mod assist;Min assist;+2 physical assistance       General transfer comment: mod A x 2 to stand for assistance with power up and steadying, mod A and min A to pivot into chair with mod A to help with weight shifting. Pt has difficulty lifting her feet up  Ambulation/Gait Ambulation/Gait assistance: Min assist;Mod assist;+2 physical assistance Gait Distance (Feet): 3 Feet Assistive device: Rolling walker (2 wheeled) Gait Pattern/deviations: Step-to pattern;Decreased step length - right;Decreased step length - left;Shuffle     General Gait Details: a few steps taken over to the chair, assistance with weight shifting and pivoting feet             Balance Overall balance assessment: Needs assistance Sitting-balance support: Feet supported;Single extremity supported Sitting balance-Leahy Scale: Poor Sitting balance - Comments: Able to sit EOB without R UE support, cues for posture as pt heavily puts weight through R arm. Postural control: Right lateral lean;Posterior lean Standing balance support: Bilateral upper extremity supported;During functional activity Standing balance-Leahy Scale: Poor Standing balance comment: Pt requires mod A to maintain standing balance at [employer] Arousal/Alertness: Awake/alert Behavior During Therapy: [facility] for tasks assessed/performed Overall Cognitive Status: Within Functional Limits for tasks assessed Area of Impairment: Problem solving  Current Attention Level: Sustained   Following Commands: Follows one step commands consistently;Follows one step commands with increased time Safety/Judgement: Decreased awareness of safety;Decreased awareness of deficits Awareness: Intellectual Problem Solving: Slow  processing;Difficulty sequencing;Requires verbal cues;Decreased initiation General Comments: Pt is alert and oriented.       Exercises Other Exercises Other Exercises: pursed lip breathing Other Exercises: sitting EOB for 5-10 minutes with min A inititally, otherwise supervision to maintain sitting balance Other Exercises: Pre-gait exercises: working on weight shifting at [employer] towards chair, mod A to initiate Other Exercises: Family education with son: recommend sitting up in recliner as often as possible, trying to eat meals EOB or in recliner with assistance. Spoke about prognosis and pt's progress thus far    General Comments General comments (skin integrity, edema, etc.): Pt is on 2L of supp O2 via [place]. SpO2 remains within 94-99% throughout tx.      Pertinent Vitals/Pain Pain Assessment: Faces Faces Pain Scale: No hurt           PT Goals (current goals can now be found in the care plan section) Acute Rehab PT Goals Patient Stated Goal: to get up PT Goal Formulation: With patient/family Time For Goal Achievement: 01/22/20 Potential to Achieve Goals: Fair Progress towards PT goals: Progressing toward goals    Frequency    Min 2X/week      PT Plan Current plan remains appropriate    Co-evaluation PT/OT/SLP Co-Evaluation/Treatment: Yes Reason for Co-Treatment: Complexity of the patient's impairments (multi-system involvement);For patient/therapist safety;To address functional/ADL transfers PT goals addressed during session: Mobility/safety with mobility;Balance;Proper use of DME        AM-PAC PT "6 Clicks" Mobility   Outcome Measure  Help needed turning from your back to your side while in a flat bed without using bedrails?: A Little Help needed moving from lying on your back to sitting on the side of a flat bed without using bedrails?: A Little Help needed moving to and from a bed to a chair (including a wheelchair)?: A Lot Help needed standing up from a chair  using your arms (e.g., wheelchair or bedside chair)?: A Lot Help needed to walk in hospital room?: Total Help needed climbing 3-5 steps with a railing? : Total 6 Click Score: 12    End of Session Equipment Utilized During Treatment: Gait belt;Oxygen Activity Tolerance: Patient limited by fatigue;Patient tolerated treatment well Patient left: in chair;with call bell/phone within reach;with chair alarm set Nurse Communication: Mobility status PT Visit Diagnosis: Unsteadiness on feet (R26.81);Other abnormalities of gait and mobility (R26.89);Muscle weakness (generalized) (M62.81);History of falling (Z91.81);Difficulty in walking, not elsewhere classified (R26.2)     Time: 5956-3875 PT Time Calculation (min) (ACUTE ONLY): 43 min  Charges:  $Therapeutic Activity: 23-37 mins                     Jodelle Green, PT, DPT Acute Rehabilitation Services Office [phone]    Jodelle Green 01/13/2020, 4:01 PM

## 2020-01-13 NOTE — Progress Notes (Addendum)
Occupational Therapy Treatment Patient Details Name: Ann Sellers MRN: [id number] DOB: Jan 31, 1925 Today's Date: 01/13/2020    History of present illness Pt is a 86 y/o female, with a PMH of asthma, glaucoma, DM, HTN, PAF, CKD, who presents to [facility] with AMS. Grandaughter reports pt was discharged from [facility] on 3/16 after aspiration pneumonia and was d/ced to SNF for rehab on 2L of oxygen. Pt's family reports pt was at baseline (able to recognize family and talk over facetime), however she began to become lethargic and started forgetting family member's names.    OT comments  Patient supine in bed on arrival with son in the room.  She was much more alert and responsive than previous visit and able to follow simple commands.  Got to EOB with min guard assist for safety.  With stand patient required mod assist x2 and RW.  For stand pivot she had trouble moving R leg and walker, requiring min/mod assist x2.  Patient completed grooming while seated, needing mod assist due to low vision.  Able to brush own mouth but not able to clean dentures.  Discussed with son after session about getting her up more with nursing to chair and trying to eat EOB.  Patient would benefit from more OT to work on strengthening for ADLs and functional mobility, as well as adapting to low vision.    Follow Up Recommendations  SNF;Supervision/Assistance - 24 hour    Equipment Recommendations  Other (comment)(tbd at next venure)    Recommendations for Other Services      Precautions / Restrictions Precautions Precautions: Fall Precaution Comments: Pt with low vision and very HOH. Restrictions Weight Bearing Restrictions: No       Mobility Bed Mobility Overal bed mobility: Needs Assistance Bed Mobility: Supine to Sit Rolling: Min guard         General bed mobility comments: min guard for safety/ steadying, increased time and cues to complete  Transfers Overall transfer level: Needs  assistance Equipment used: Rolling walker (2 wheeled) Transfers: Sit to/from [facility] Sit to Stand: Mod assist;+2 physical assistance Stand pivot transfers: Mod assist;Min assist;+2 physical assistance       General transfer comment: mod A x 2 to stand for assistance with power up and steadying, mod A and min A to pivot into chair with mod A to help with weight shifting. Pt has difficulty lifting her feet up    Balance Overall balance assessment: Needs assistance Sitting-balance support: Feet supported;Single extremity supported Sitting balance-Leahy Scale: Poor                                ADL either performed or assessed with clinical judgement   ADL Overall ADL's : Needs assistance/impaired     Grooming: Wash/dry face;Wash/dry hands;Oral care;Moderate assistance;Sitting           Upper Body Dressing : Maximal assistance;Sitting                   Functional mobility during ADLs: Minimal assistance;Moderate assistance;+2 for safety/equipment;Rolling walker       Vision       Perception     Praxis      Cognition Arousal/Alertness: Awake/alert Behavior During Therapy: WFL for tasks assessed/performed Overall Cognitive Status: Within Functional Limits for tasks assessed Area of Impairment: Problem solving  Current Attention Level: Sustained   Following Commands: Follows one step commands consistently;Follows one step commands with increased time Safety/Judgement: Decreased awareness of safety;Decreased awareness of deficits Awareness: Emergent Problem Solving: Slow processing;Difficulty sequencing;Requires verbal cues;Decreased initiation General Comments: Pt is alert and oriented.         Exercises Exercises: Other exercises Other Exercises Other Exercises: pursed lip breathing Other Exercises: sitting EOB for 5-10 minutes with min A inititally, otherwise supervision to maintain sitting  balance    Shoulder Instructions       General Comments Pt is on 2L of supp O2 via . SpO2 remains within 94-99% throughout tx.    Pertinent Vitals/ Pain       Pain Assessment: Faces Faces Pain Scale: No hurt  Home Living                                          Prior Functioning/Environment              Frequency  Min 2X/week        Progress Toward Goals  OT Goals(current goals can now be found in the care plan section)  Progress towards OT goals: Progressing toward goals  Acute Rehab OT Goals Patient Stated Goal: to get up OT Goal Formulation: With patient Time For Goal Achievement: 01/22/20 Potential to Achieve Goals: Good  Plan Discharge plan remains appropriate    Co-evaluation    PT/OT/SLP Co-Evaluation/Treatment: Yes Reason for Co-Treatment: Complexity of the patient's impairments (multi-system involvement);For patient/therapist safety;To address functional/ADL transfers PT goals addressed during session: Mobility/safety with mobility;Balance;Proper use of DME OT goals addressed during session: ADL's and self-care      AM-PAC OT "6 Clicks" Daily Activity     Outcome Measure   Help from another person eating meals?: A Little Help from another person taking care of personal grooming?: A Lot Help from another person toileting, which includes using toliet, bedpan, or urinal?: A Lot Help from another person bathing (including washing, rinsing, drying)?: A Lot Help from another person to put on and taking off regular upper body clothing?: A Lot Help from another person to put on and taking off regular lower body clothing?: A Lot 6 Click Score: 13    End of Session Equipment Utilized During Treatment: Gait belt;Rolling walker;Oxygen  OT Visit Diagnosis: Unsteadiness on feet (R26.81);Other abnormalities of gait and mobility (R26.89);Pain;Other symptoms and signs involving cognitive function;Low vision, both eyes (H54.2);Muscle  weakness (generalized) (M62.81)   Activity Tolerance Patient tolerated treatment well;Patient limited by fatigue   Patient Left in chair;with call bell/phone within reach;with chair alarm set;with family/visitor present   Nurse Communication Mobility status        Time: 5009-3818 OT Time Calculation (min): 45 min  Charges: OT General Charges $OT Visit: 1 Visit OT Treatments $Self Care/Home Management : 8-22 mins  August Luz, OTR/L    [name] 01/13/2020, 4:09 PM

## 2020-01-13 NOTE — TOC Progression Note (Signed)
Transition of Care [facility]) - Progression Note    Patient Details  Name: ALYONA ROMACK MRN: [id number] Date of Birth: April 07, 1925  Transition of Care [facility]) CM/SW [place], RN Phone Number: 01/13/2020, 2:59 PM  Clinical Narrative:   CM at the bedside to follow up with the son who states that the family has decided on [facility]. Son gave verbal permission to send information to the facility. Son also states that he has filed an extended stay appeal. Son states that this was done to appeal the discharge. CM explained to son that the patient has not been discharged at this point. Son states that this appeal was filed this morning. [place] has been made aware of acceptance of bed offer. MD updated.          Expected Discharge Plan and Services                                                 Social Determinants of Health (SDOH) Interventions    Readmission Risk Interventions Readmission Risk Prevention Plan 01/11/2020  Transportation Screening Complete  PCP or Specialist Appt within 3-5 Days Complete  HRI or [place] Complete  Social Work Consult for [place] Planning/Counseling Complete  Palliative Care Screening Not Applicable  Medication Review Press photographer) Referral to Pharmacy  Some recent data might be hidden

## 2020-01-13 NOTE — Plan of Care (Signed)
  Problem: Health Behavior/Discharge Planning: Goal: Ability to manage health-related needs will improve Outcome: Progressing   Problem: Clinical Measurements: Goal: Ability to maintain clinical measurements within normal limits will improve Outcome: Progressing Goal: Will remain free from infection Outcome: Progressing Goal: Respiratory complications will improve Outcome: Progressing Goal: Cardiovascular complication will be avoided Outcome: Progressing   Problem: Activity: Goal: Risk for activity intolerance will decrease Outcome: Progressing   Problem: Nutrition: Goal: Adequate nutrition will be maintained Outcome: Progressing   Problem: Coping: Goal: Level of anxiety will decrease Outcome: Progressing

## 2020-01-14 LAB — GLUCOSE, CAPILLARY
Glucose-Capillary: 90 mg/dL (ref 70–99)
Glucose-Capillary: 121 mg/dL — ABNORMAL HIGH (ref 70–99)
Glucose-Capillary: 129 mg/dL — ABNORMAL HIGH (ref 70–99)
Glucose-Capillary: 112 mg/dL — ABNORMAL HIGH (ref 70–99)

## 2020-01-14 LAB — CULTURE, BLOOD (ROUTINE X 2)
Culture: NO GROWTH
Culture: NO GROWTH
Special Requests: ADEQUATE
Special Requests: ADEQUATE

## 2020-01-14 NOTE — Care Management Important Message (Signed)
Important Message  Patient Details  Name: Ann Sellers MRN: [id number] Date of Birth: Sep 07, 1925   [name] Appeal Detailed Notice of Discharge letter created and saved: Yes Detailed Notice of Discharge Document Given to [place]: Yes [employer] ROI Document Created: Yes [employer] appeal documents uploaded to Kepro stite: Yes  [name] P [name] 01/14/2020, 2:27 PM

## 2020-01-14 NOTE — Consult Note (Signed)
   [facility] CM Inpatient Consult   01/14/2020  ROZALYNN BUEGE [birth year] [id number]   Patient screened for high risk score for unplanned readmission score of 27% and for readmission  Hospitalization less than 7 days in the [employer] [[employer]] Medicare with [place] for review for Care Management service needs.  Review of patient's medical record with Physical and Occupational Therapy is recommending a skilled nursing facility for rehab. Notes that patient admitted from a skilled facility.  Primary Care Provider is Dr. Reynold Bowen, [facility], and this provider is listed for the transition of care [TOC] follow up.  Pharmacy is: [facility]  Plan:  Follow up with inpatient [facility] team notes also reveals for a skilled nursing transition. With a skilled nursing facility transitioned plan there are currently no [facility] Care Management community [disposition to home] follow up assessed with [place] Medicare at a facility.  Continue to follow progress and disposition to assess for post hospital care management needs.    For questions contact:   Natividad Brood, RN BSN [facility] Liaison  [phone] business mobile phone Toll free office [phone]  Fax number: [phone] [email or web address]

## 2020-01-14 NOTE — Progress Notes (Signed)
Followed up with [employer] to verify authorization request. Authorization has been approved but an authorization number has not been generated yet.  Temporary navi number - [id number] which can be used as the reference number. This number is good for [facility] with a start date of 01/14/2020- 01/18/2020. The Care Coordinator is Darnelle Bos.

## 2020-01-14 NOTE — TOC Progression Note (Signed)
Transition of Care [facility]) - Progression Note    Patient Details  Name: Ann Sellers MRN: [id number] Date of Birth: Nov 08, 1924  Transition of Care [facility]) CM/SW Contact  Angelita Ingles, RN Phone Number: 01/14/2020, 2:00 PM  Clinical Narrative:    Insurance authorization initiated for SNF placement to [facility]. Family has filed a discharge appeal. Appropriate forms have been completed and documented. Results of appeal pending within the next 48 hours. Patient will need a new Covid test prior to d/c to SNF. MD has been updated on all of the information above. Will continue to follow.     Barriers to Discharge: Continued Medical Work up, Ship broker, Family Issues(Family having a difficult time choosing bed)  Expected Discharge Plan and Services           Expected Discharge Date: 01/13/20                                     Social Determinants of Health (SDOH) Interventions    Readmission Risk Interventions Readmission Risk Prevention Plan 01/11/2020  Transportation Screening Complete  PCP or Specialist Appt within 3-5 Days Complete  HRI or [place] Complete  Social Work Consult for [place] Planning/Counseling Complete  Palliative Care Screening Not Applicable  Medication Review Press photographer) Referral to Pharmacy  Some recent data might be hidden

## 2020-01-14 NOTE — Care Management Important Message (Signed)
Important Message  Patient Details  Name: Ann Sellers MRN: [id number] Date of Birth: Mar 08, 1925   Medicare Important Message Given:  Yes     Antonie Borjon 01/14/2020, 11:56 AM

## 2020-01-14 NOTE — Progress Notes (Addendum)
NAME:  Ann Sellers, MRN:  [id number], DOB:  21-Oct-1925, LOS: 7 ADMISSION DATE:  01/07/2020  Subjective/Interm History  No overnight events. VSS Daughter at bedside this morning.  Objective   Blood pressure (!) 148/76, pulse 60, temperature 98.3 F (36.8 C), resp. rate 18, weight 82.1 kg, SpO2 100 %.     Intake/Output Summary (Last 24 hours) at 01/14/2020 1515 Last data filed at 01/14/2020 0440 Gross per 24 hour  Intake -  Output 500 ml  Net -500 ml    Examination: GENERAL: chronically ill appearing female CARDIAC: extremities warm. PULMONARY: breathing comfortably on 2L [place]. No gross wheezes. ABD: abdomen nondistended Skin: dressing on bridge of nose. Senile purpura primarily involving extremities.  Significant Diagnostic Tests:  3/19 CXR> cardiomegaly with vascular congestion and bilateral pleural effusions; persistent retrocardiac consolidation as seen on recent CT  Labs    CBC Latest Ref Rng & Units 01/13/2020 01/10/2020 01/09/2020  WBC 4.0 - 10.5 K/uL 6.5 7.7 7.3  Hemoglobin 12.0 - 15.0 g/dL 7.7(L) 9.0(L) 8.8(L)  Hematocrit 36.0 - 46.0 % 25.5(L) 29.9(L) 29.1(L)  Platelets 150 - 400 K/uL 136(L) 181 241   BMP Latest Ref Rng & Units 01/13/2020 01/12/2020 01/11/2020  Glucose 70 - 99 mg/dL 173(H) 166(H) 154(H)  BUN 8 - 23 mg/dL 75(H) 77(H) 76(H)  Creatinine 0.44 - 1.00 mg/dL 2.02(H) 2.23(H) 2.28(H)  BUN/Creat Ratio 12 - 28 - - -  Sodium 135 - 145 mmol/L 143 145 143  Potassium 3.5 - 5.1 mmol/L 4.4 4.1 4.1  Chloride 98 - 111 mmol/L 100 99 100  CO2 22 - 32 mmol/L 35(H) 34(H) 34(H)  Calcium 8.9 - 10.3 mg/dL 8.6(L) 8.6(L) 8.5(L)    Summary  86 yo female with PMH of asthma, CKD IV who is presenting as a readmission from [facility] for progressive somulence over the past 2d and admitted to [facility] for acute on chronic respiratory failure. Assessment & Plan:  Active Problems:   Asthma   Hypertension   Chronic kidney disease (CKD), stage IV (severe) (HCC)   Morbid obesity (HCC)    Chronic diastolic heart failure (HCC)   PAF (paroxysmal atrial fibrillation) (HCC)   Acute respiratory failure (HCC)   Altered mental status  RESOLVED Acute hypercapnic respiratory failure due to asthma exacerbation 5d course of prednisone complete. Remains on 2L [place] Plan:  Stable for discharge. Will discharge with prn BiPAP and continuous 2L O2 Continue singulair. brovana and pulmicort started 3/23. Prn albuterol nebs.  CKD IV. Improvement in renal function with IVF yesterday. Hypernatremia resolved Plan: will restart K binder with valtessa. Continue Calcitriol, phoslo tid  Chronic diastolic heart failure.  Stable. Paroxysmal atrial fibrillation not on anticoagulation. Follows with Dr. Meda Coffee from cardiology. In SR at this time. Plan Continue metoprolol xl 50mg  Daily weights. Tele monitoring.  Type II Diabetes Mellitus Blood sugars stable. Continue SSI + 10U lantus hs  Best practice:  CODE STATUS: DNR Diet: regular DVT for prophylaxis: lovenox Social considerations/Family communication:see yesterday's note for discussion with son Dispo: stable for discharge pending bed placement. CM updated.  Patient's chronic respiratory failure due to asthma is life threatening.  Previous ABG's have documented high PCO2 and spirometry reveals moderate obstructive ventilatory defect.  Patient would benefit from non-invasive ventilation.  Without this therapy, the patient is at high risk of ending up with worsening symptoms, worsened respiratory failure, need for ER visits and/or recurrent hospitalizations.  Bilevel device unable to adequately support patient's nocturnal ventilation needs.  Patient would benefit from NIV therapy  with set tidal volumes and pressure.   Mitzi Hansen, MD INTERNAL MEDICINE RESIDENT PGY-1 PAGER #: [phone] 01/14/20  3:15 PM

## 2020-01-15 LAB — GLUCOSE, CAPILLARY
Glucose-Capillary: 86 mg/dL (ref 70–99)
Glucose-Capillary: 167 mg/dL — ABNORMAL HIGH (ref 70–99)
Glucose-Capillary: 106 mg/dL — ABNORMAL HIGH (ref 70–99)
Glucose-Capillary: 176 mg/dL — ABNORMAL HIGH (ref 70–99)

## 2020-01-15 LAB — CBC
HCT: 29.8 % — ABNORMAL LOW (ref 36.0–46.0)
Hemoglobin: 8.7 g/dL — ABNORMAL LOW (ref 12.0–15.0)
MCH: 30.1 pg (ref 26.0–34.0)
MCHC: 29.2 g/dL — ABNORMAL LOW (ref 30.0–36.0)
MCV: 103.1 fL — ABNORMAL HIGH (ref 80.0–100.0)
Platelets: 143 10*3/uL — ABNORMAL LOW (ref 150–400)
RBC: 2.89 MIL/uL — ABNORMAL LOW (ref 3.87–5.11)
RDW: 14.7 % (ref 11.5–15.5)
WBC: 7.3 10*3/uL (ref 4.0–10.5)
nRBC: 0 % (ref 0.0–0.2)

## 2020-01-15 LAB — BASIC METABOLIC PANEL
Anion gap: 8 (ref 5–15)
BUN: 56 mg/dL — ABNORMAL HIGH (ref 8–23)
CO2: 36 mmol/L — ABNORMAL HIGH (ref 22–32)
Calcium: 9.3 mg/dL (ref 8.9–10.3)
Chloride: 103 mmol/L (ref 98–111)
Creatinine, Ser: 1.79 mg/dL — ABNORMAL HIGH (ref 0.44–1.00)
GFR calc Af Amer: 28 mL/min — ABNORMAL LOW (ref >60)
GFR calc non Af Amer: 24 mL/min — ABNORMAL LOW (ref >60)
Glucose, Bld: 94 mg/dL (ref 70–99)
Potassium: 4.7 mmol/L (ref 3.5–5.1)
Sodium: 147 mmol/L — ABNORMAL HIGH (ref 135–145)

## 2020-01-15 LAB — SARS CORONAVIRUS 2 (TAT 6-24 HRS): SARS Coronavirus 2: NEGATIVE

## 2020-01-15 NOTE — Progress Notes (Signed)
   NAME:  Ann Sellers, MRN:  [id number], DOB:  [birth year], LOS: 8 ADMISSION DATE:  01/07/2020  Subjective/Interm History  No overnight events. VSS. No complaints this morning.  Objective   Blood pressure (!) 146/56, pulse 74, temperature (!) 97.4 F (36.3 C), resp. rate 18, weight 83.9 kg, SpO2 100 %.     Intake/Output Summary (Last 24 hours) at 01/15/2020 0851 Last data filed at 01/14/2020 1900 Gross per 24 hour  Intake --  Output 700 ml  Net -700 ml    Examination: GENERAL: in NAD CARDIAC: RRR.  PULMONARY: lungs clear Skin: slight skin breakdown on bridge of nose from BiPAP Senile purpura primarily involving extremities.  Significant Diagnostic Tests:  3/19 CXR> cardiomegaly with vascular congestion and bilateral pleural effusions; persistent retrocardiac consolidation as seen on recent CT  Labs    CBC Latest Ref Rng & Units 01/15/2020 01/13/2020 01/10/2020  WBC 4.0 - 10.5 K/uL 7.3 6.5 7.7  Hemoglobin 12.0 - 15.0 g/dL 8.7(L) 7.7(L) 9.0(L)  Hematocrit 36.0 - 46.0 % 29.8(L) 25.5(L) 29.9(L)  Platelets 150 - 400 K/uL 143(L) 136(L) 181   BMP Latest Ref Rng & Units 01/13/2020 01/12/2020 01/11/2020  Glucose 70 - 99 mg/dL 173(H) 166(H) 154(H)  BUN 8 - 23 mg/dL 75(H) 77(H) 76(H)  Creatinine 0.44 - 1.00 mg/dL 2.02(H) 2.23(H) 2.28(H)  BUN/Creat Ratio 12 - 28 - - -  Sodium 135 - 145 mmol/L 143 145 143  Potassium 3.5 - 5.1 mmol/L 4.4 4.1 4.1  Chloride 98 - 111 mmol/L 100 99 100  CO2 22 - 32 mmol/L 35(H) 34(H) 34(H)  Calcium 8.9 - 10.3 mg/dL 8.6(L) 8.6(L) 8.5(L)    Summary  86 yo female with PMH of asthma, CKD IV who is presenting as a readmission from [facility] for progressive somulence over the past 2d and admitted to IMTS for acute on chronic respiratory failure. Assessment & Plan:  Active Problems:   Asthma   Hypertension   Chronic kidney disease (CKD), stage IV (severe) (HCC)   Morbid obesity (HCC)   Chronic diastolic heart failure (HCC)   PAF (paroxysmal atrial  fibrillation) (HCC)   Acute respiratory failure (HCC)   Altered mental status  RESOLVED Acute hypercapnic respiratory failure due to asthma exacerbation 5d course of prednisone complete. Remains on 2L [place] Plan:  Stable for discharge. Will discharge with prn BiPAP and continuous 2L O2 Continue singulair. brovana and pulmicort started 3/23. Prn albuterol nebs.  Best practice:  CODE STATUS: DNR Diet: regular DVT for prophylaxis: lovenox Dispo: stable for discharge pending bed placement. CM updated.  Patient's chronic respiratory failure due to asthma is life threatening.  Previous ABG's have documented high PCO2 and spirometry reveals moderate obstructive ventilatory defect.  Patient would benefit from non-invasive ventilation.  Without this therapy, the patient is at high risk of ending up with worsening symptoms, worsened respiratory failure, need for ER visits and/or recurrent hospitalizations.  Bilevel device unable to adequately support patient's nocturnal ventilation needs.  Patient would benefit from NIV therapy with set tidal volumes and pressure.   Mitzi Hansen, MD INTERNAL MEDICINE RESIDENT PGY-1 PAGER #: [phone] 01/15/20  8:51 AM

## 2020-01-15 NOTE — Progress Notes (Signed)
Cardiac monitoring discontinued per order.

## 2020-01-15 NOTE — Social Work (Signed)
CSW spoke with [facility]. Confirmed patient is able to discharge to facility once medially stable.  Criss Alvine, CSW

## 2020-01-15 NOTE — TOC Progression Note (Addendum)
Transition of Care [facility]) - Progression Note    Patient Details  Name: Ann Sellers MRN: [id number] Date of Birth: Oct 10, 1925  [facility]) CM/SW [place], [place] Phone Number: 01/15/2020, 5:32 PM  Clinical Narrative:    3/28 Update: CSW was contacted by [facility] and informed the supply department will not be available until Monday. CSW informed a new covid will not be needed. CSW will continue to follow and assist with discharge planning needs.  Prior to discharge, MD inquired on patient's BIPAP that is needed. CSW contacted [facility] and was informed the facility will have to order the BIPAP and would need the settings specific to the patient. [place] requested the information be provided to them Sunday morning and they will have it ordered within the same day.   NCM assisted CSW with reaching out to the respiratory therapist to get the setting information. CSW will follow-up. CSW will continue to follow and assist with discharge planning needs.   Barriers to Discharge: Continued Medical Work up, Ship broker, Family Issues(Family having a difficult time choosing bed)  Expected Discharge Plan and Services           Expected Discharge Date: 01/13/20                                     Social Determinants of Health (SDOH) Interventions    Readmission Risk Interventions Readmission Risk Prevention Plan 01/11/2020  Transportation Screening Complete  PCP or Specialist Appt within 3-5 Days Complete  HRI or [place] Complete  Social Work Consult for [place] Planning/Counseling Complete  Palliative Care Screening Not Applicable  Medication Review Press photographer) Referral to Pharmacy  Some recent data might be hidden

## 2020-01-16 LAB — GLUCOSE, CAPILLARY
Glucose-Capillary: 98 mg/dL (ref 70–99)
Glucose-Capillary: 167 mg/dL — ABNORMAL HIGH (ref 70–99)
Glucose-Capillary: 127 mg/dL — ABNORMAL HIGH (ref 70–99)
Glucose-Capillary: 127 mg/dL — ABNORMAL HIGH (ref 70–99)

## 2020-01-16 NOTE — Progress Notes (Signed)
   NAME:  REDELL BHANDARI, MRN:  [id number], DOB:  16-Jun-1925, LOS: 9 ADMISSION DATE:  01/07/2020  Subjective/Interm History  Bed available at [facility] last evening however no BiPAP was available there and they would have to order it. CM indicated that they could get it by today so will most likely be able to transfer to [facility] pending obtaining BiPAP settings by RT.  Objective   Blood pressure (!) 155/70, pulse 66, temperature 98.1 F (36.7 C), temperature source Axillary, resp. rate (!) 22, weight 83.9 kg, SpO2 99 %.   Examination: GENERAL: in NAD CARDIAC: RRR.  PULMONARY: lungs clear Skin: dressing to nose wound intact  Labs    CBC Latest Ref Rng & Units 01/15/2020 01/13/2020 01/10/2020  WBC 4.0 - 10.5 K/uL 7.3 6.5 7.7  Hemoglobin 12.0 - 15.0 g/dL 8.7(L) 7.7(L) 9.0(L)  Hematocrit 36.0 - 46.0 % 29.8(L) 25.5(L) 29.9(L)  Platelets 150 - 400 K/uL 143(L) 136(L) 181   BMP Latest Ref Rng & Units 01/15/2020 01/13/2020 01/12/2020  Glucose 70 - 99 mg/dL 94 173(H) 166(H)  BUN 8 - 23 mg/dL 56(H) 75(H) 77(H)  Creatinine 0.44 - 1.00 mg/dL 1.79(H) 2.02(H) 2.23(H)  BUN/Creat Ratio 12 - 28 - - -  Sodium 135 - 145 mmol/L 147(H) 143 145  Potassium 3.5 - 5.1 mmol/L 4.7 4.4 4.1  Chloride 98 - 111 mmol/L 103 100 99  CO2 22 - 32 mmol/L 36(H) 35(H) 34(H)  Calcium 8.9 - 10.3 mg/dL 9.3 8.6(L) 8.6(L)    Summary  86 yo female with PMH of asthma, CKD IV who is presenting as a readmission from [facility] for progressive somulence over the past 2d and admitted to IMTS for acute on chronic respiratory failure. Assessment & Plan:  Active Problems:   Asthma   Hypertension   Chronic kidney disease (CKD), stage IV (severe) (HCC)   Morbid obesity (HCC)   Chronic diastolic heart failure (HCC)   PAF (paroxysmal atrial fibrillation) (HCC)   Acute respiratory failure (HCC)   Altered mental status  RESOLVED Acute hypercapnic respiratory failure due to asthma exacerbation 5d course of prednisone complete. Remains  on 2L supplemental O2 Plan:  Stable for discharge once [facility] has BiPAP available. Will discharge with prn BiPAP and continuous 2L O2 Continue singulair. brovana and pulmicort started 3/23. Prn albuterol nebs.  Best practice:  CODE STATUS: DNR Diet: regular DVT for prophylaxis: lovenox Dispo: stable for discharge [facility] availability at [facility]  Patient's chronic respiratory failure due to asthma is life threatening.  Previous ABG's have documented high PCO2 and spirometry reveals moderate obstructive ventilatory defect.  Patient would benefit from non-invasive ventilation.  Without this therapy, the patient is at high risk of ending up with worsening symptoms, worsened respiratory failure, need for ER visits and/or recurrent hospitalizations.  Bilevel device unable to adequately support patient's nocturnal ventilation needs.  Patient would benefit from NIV therapy with set tidal volumes and pressure.   Mitzi Hansen, MD INTERNAL MEDICINE RESIDENT PGY-1 PAGER #: [phone] 01/16/20  4:42 AM

## 2020-01-17 DIAGNOSIS — E875 Hyperkalemia: Secondary | ICD-10-CM | POA: Diagnosis not present

## 2020-01-17 DIAGNOSIS — N390 Urinary tract infection, site not specified: Secondary | ICD-10-CM | POA: Diagnosis not present

## 2020-01-17 DIAGNOSIS — R4182 Altered mental status, unspecified: Secondary | ICD-10-CM | POA: Diagnosis not present

## 2020-01-17 DIAGNOSIS — J45901 Unspecified asthma with (acute) exacerbation: Secondary | ICD-10-CM | POA: Diagnosis not present

## 2020-01-17 DIAGNOSIS — M109 Gout, unspecified: Secondary | ICD-10-CM | POA: Diagnosis not present

## 2020-01-17 DIAGNOSIS — J690 Pneumonitis due to inhalation of food and vomit: Secondary | ICD-10-CM | POA: Diagnosis not present

## 2020-01-17 DIAGNOSIS — R1310 Dysphagia, unspecified: Secondary | ICD-10-CM | POA: Diagnosis not present

## 2020-01-17 DIAGNOSIS — L98419 Non-pressure chronic ulcer of buttock with unspecified severity: Secondary | ICD-10-CM | POA: Diagnosis not present

## 2020-01-17 DIAGNOSIS — R001 Bradycardia, unspecified: Secondary | ICD-10-CM | POA: Diagnosis not present

## 2020-01-17 DIAGNOSIS — R5381 Other malaise: Secondary | ICD-10-CM | POA: Diagnosis not present

## 2020-01-17 DIAGNOSIS — M6281 Muscle weakness (generalized): Secondary | ICD-10-CM | POA: Diagnosis not present

## 2020-01-17 DIAGNOSIS — N183 Chronic kidney disease, stage 3 unspecified: Secondary | ICD-10-CM | POA: Diagnosis not present

## 2020-01-17 DIAGNOSIS — R52 Pain, unspecified: Secondary | ICD-10-CM | POA: Diagnosis not present

## 2020-01-17 DIAGNOSIS — M2550 Pain in unspecified joint: Secondary | ICD-10-CM | POA: Diagnosis not present

## 2020-01-17 DIAGNOSIS — I480 Paroxysmal atrial fibrillation: Secondary | ICD-10-CM | POA: Diagnosis not present

## 2020-01-17 DIAGNOSIS — J9622 Acute and chronic respiratory failure with hypercapnia: Secondary | ICD-10-CM | POA: Diagnosis not present

## 2020-01-17 DIAGNOSIS — I5032 Chronic diastolic (congestive) heart failure: Secondary | ICD-10-CM | POA: Diagnosis not present

## 2020-01-17 DIAGNOSIS — J4521 Mild intermittent asthma with (acute) exacerbation: Secondary | ICD-10-CM | POA: Diagnosis not present

## 2020-01-17 DIAGNOSIS — R031 Nonspecific low blood-pressure reading: Secondary | ICD-10-CM | POA: Diagnosis not present

## 2020-01-17 DIAGNOSIS — N1831 Chronic kidney disease, stage 3a: Secondary | ICD-10-CM | POA: Diagnosis not present

## 2020-01-17 DIAGNOSIS — K219 Gastro-esophageal reflux disease without esophagitis: Secondary | ICD-10-CM | POA: Diagnosis not present

## 2020-01-17 DIAGNOSIS — I10 Essential (primary) hypertension: Secondary | ICD-10-CM | POA: Diagnosis not present

## 2020-01-17 DIAGNOSIS — Z7401 Bed confinement status: Secondary | ICD-10-CM | POA: Diagnosis not present

## 2020-01-17 DIAGNOSIS — D649 Anemia, unspecified: Secondary | ICD-10-CM | POA: Diagnosis not present

## 2020-01-17 DIAGNOSIS — R600 Localized edema: Secondary | ICD-10-CM | POA: Diagnosis not present

## 2020-01-17 LAB — GLUCOSE, CAPILLARY
Glucose-Capillary: 102 mg/dL — ABNORMAL HIGH (ref 70–99)
Glucose-Capillary: 159 mg/dL — ABNORMAL HIGH (ref 70–99)

## 2020-01-17 MED ORDER — HYDROCODONE-ACETAMINOPHEN 5-325 MG PO TABS: 0.5000 | ORAL_TABLET | Freq: Four times a day (QID) | ORAL | 0 refills | Status: AC | PRN

## 2020-01-17 NOTE — Progress Notes (Signed)
Patient discharging to [facility] today.  Report called to [place] at [facility].  Patient is anxious about discharge because she doesn't know anyone there yet.  TLC given to patient.   IV access discontinued.  AVS completed.  Awaiting [facility] for patient transport.  Son and daughter present for discharge.

## 2020-01-17 NOTE — TOC Transition Note (Addendum)
Transition of Care [facility]) - CM/SW Discharge Note   Patient Details  Name: Ann Sellers MRN: [id number] Date of Birth: 12/20/1924  [facility] CM/SW Contact:  Angelita Ingles, RN Phone Number: 01/17/2020, 1:03 PM   Clinical Narrative:    Patient to be discharged to [facility]. Son Pasqualina Colasurdo at the bedside has been made aware. Facility has verified that they do have BIPAP ready for patient. Bipap settings have been added to the updated discharge summary. Patient to be transported via [place].[facility] has been notified for transport. Bedside nurse made updated. No further needs at this time.   [facility] # [phone]     Final next level of care: [place] Barriers to Discharge: No Barriers Identified   Patient Goals and CMS Choice        Discharge Placement   Existing PASRR number confirmed : 01/17/20          Patient chooses bed at: [facility] Patient to be transferred to facility by: [place] Name of family member notified: [name] Patient and family notified of of transfer: 01/17/20  Discharge Plan and Services                                     Social Determinants of Health (SDOH) Interventions     Readmission Risk Interventions Readmission Risk Prevention Plan 01/11/2020  Transportation Screening Complete  PCP or Specialist Appt within 3-5 Days Complete  HRI or [place] Complete  Social Work Consult for [place] Planning/Counseling Complete  Palliative Care Screening Not Applicable  Medication Review Press photographer) Referral to Pharmacy  Some recent data might be hidden

## 2020-01-17 NOTE — Progress Notes (Signed)
   NAME:  KLANI CARIDI, MRN:  [id number], DOB:  May 21, 1925, LOS: 4 ADMISSION DATE:  01/07/2020  Subjective/Interm History  No overnight events. VSS. Discussed discharge plans for later today  Objective   Blood pressure (!) 140/50, pulse (!) 55, temperature 98.3 F (36.8 C), temperature source Oral, resp. rate 20, weight 83.9 kg, SpO2 97 %.   Examination: GENERAL: in NAD CARDIAC: tissues well perfused. PULMONARY: breathing comfortably on 2L [place] Skin: dressing to nose wound intact  Labs    CBC Latest Ref Rng & Units 01/15/2020 01/13/2020 01/10/2020  WBC 4.0 - 10.5 K/uL 7.3 6.5 7.7  Hemoglobin 12.0 - 15.0 g/dL 8.7(L) 7.7(L) 9.0(L)  Hematocrit 36.0 - 46.0 % 29.8(L) 25.5(L) 29.9(L)  Platelets 150 - 400 K/uL 143(L) 136(L) 181   BMP Latest Ref Rng & Units 01/15/2020 01/13/2020 01/12/2020  Glucose 70 - 99 mg/dL 94 173(H) 166(H)  BUN 8 - 23 mg/dL 56(H) 75(H) 77(H)  Creatinine 0.44 - 1.00 mg/dL 1.79(H) 2.02(H) 2.23(H)  BUN/Creat Ratio 12 - 28 - - -  Sodium 135 - 145 mmol/L 147(H) 143 145  Potassium 3.5 - 5.1 mmol/L 4.7 4.4 4.1  Chloride 98 - 111 mmol/L 103 100 99  CO2 22 - 32 mmol/L 36(H) 35(H) 34(H)  Calcium 8.9 - 10.3 mg/dL 9.3 8.6(L) 8.6(L)    Summary  86 yo female with PMH of asthma, CKD IV who is presenting as a readmission from [facility] for progressive somulence over the past 2d and admitted to IMTS for acute on chronic respiratory failure. Assessment & Plan:  Active Problems:   Asthma   Hypertension   Chronic kidney disease (CKD), stage IV (severe) (HCC)   Morbid obesity (HCC)   Chronic diastolic heart failure (HCC)   PAF (paroxysmal atrial fibrillation) (HCC)   Acute respiratory failure (HCC)   Altered mental status  RESOLVED Acute hypercapnic respiratory failure due to asthma exacerbation 5d course of prednisone complete. Remains on 2L supplemental O2 Plan:  Stable for discharge to [facility] pending BiPAP availability at the facility Continue singulair. brovana  and pulmicort started 3/23. Prn albuterol nebs.  Best practice:  CODE STATUS: DNR Diet: regular DVT for prophylaxis: lovenox Dispo: stable for discharge BiPAP availability at [place], [place] PGY-1 PAGER #: [phone] 01/17/20  5:02 AM

## 2020-01-18 DIAGNOSIS — J690 Pneumonitis due to inhalation of food and vomit: Secondary | ICD-10-CM | POA: Diagnosis not present

## 2020-01-18 DIAGNOSIS — J9622 Acute and chronic respiratory failure with hypercapnia: Secondary | ICD-10-CM | POA: Diagnosis not present

## 2020-01-18 DIAGNOSIS — J45901 Unspecified asthma with (acute) exacerbation: Secondary | ICD-10-CM | POA: Diagnosis not present

## 2020-01-18 DIAGNOSIS — I480 Paroxysmal atrial fibrillation: Secondary | ICD-10-CM | POA: Diagnosis not present

## 2020-01-20 DIAGNOSIS — L98419 Non-pressure chronic ulcer of buttock with unspecified severity: Secondary | ICD-10-CM | POA: Diagnosis not present

## 2020-01-27 DIAGNOSIS — L98419 Non-pressure chronic ulcer of buttock with unspecified severity: Secondary | ICD-10-CM | POA: Diagnosis not present

## 2020-01-31 DIAGNOSIS — R600 Localized edema: Secondary | ICD-10-CM | POA: Diagnosis not present

## 2020-01-31 DIAGNOSIS — I5032 Chronic diastolic (congestive) heart failure: Secondary | ICD-10-CM | POA: Diagnosis not present

## 2020-01-31 DIAGNOSIS — N1831 Chronic kidney disease, stage 3a: Secondary | ICD-10-CM | POA: Diagnosis not present

## 2020-01-31 DIAGNOSIS — J9622 Acute and chronic respiratory failure with hypercapnia: Secondary | ICD-10-CM | POA: Diagnosis not present

## 2020-02-03 ENCOUNTER — Ambulatory Visit: Payer: Medicare Other | Admitting: Cardiology

## 2020-02-03 DIAGNOSIS — L98419 Non-pressure chronic ulcer of buttock with unspecified severity: Secondary | ICD-10-CM | POA: Diagnosis not present

## 2020-02-07 DIAGNOSIS — J9622 Acute and chronic respiratory failure with hypercapnia: Secondary | ICD-10-CM | POA: Diagnosis not present

## 2020-02-07 DIAGNOSIS — N1831 Chronic kidney disease, stage 3a: Secondary | ICD-10-CM | POA: Diagnosis not present

## 2020-02-07 DIAGNOSIS — N390 Urinary tract infection, site not specified: Secondary | ICD-10-CM | POA: Diagnosis not present

## 2020-02-07 DIAGNOSIS — R600 Localized edema: Secondary | ICD-10-CM | POA: Diagnosis not present

## 2020-02-11 DIAGNOSIS — R001 Bradycardia, unspecified: Secondary | ICD-10-CM | POA: Diagnosis not present

## 2020-02-11 DIAGNOSIS — I480 Paroxysmal atrial fibrillation: Secondary | ICD-10-CM | POA: Diagnosis not present

## 2020-02-11 DIAGNOSIS — R031 Nonspecific low blood-pressure reading: Secondary | ICD-10-CM | POA: Diagnosis not present

## 2020-02-11 DIAGNOSIS — R5381 Other malaise: Secondary | ICD-10-CM | POA: Diagnosis not present

## 2020-02-18 DIAGNOSIS — E11621 Type 2 diabetes mellitus with foot ulcer: Secondary | ICD-10-CM | POA: Diagnosis not present

## 2020-02-18 DIAGNOSIS — N184 Chronic kidney disease, stage 4 (severe): Secondary | ICD-10-CM | POA: Diagnosis not present

## 2020-02-18 DIAGNOSIS — E785 Hyperlipidemia, unspecified: Secondary | ICD-10-CM | POA: Diagnosis not present

## 2020-02-18 DIAGNOSIS — M109 Gout, unspecified: Secondary | ICD-10-CM | POA: Diagnosis not present

## 2020-02-22 DIAGNOSIS — J4550 Severe persistent asthma, uncomplicated: Secondary | ICD-10-CM | POA: Diagnosis not present

## 2020-02-22 DIAGNOSIS — J9610 Chronic respiratory failure, unspecified whether with hypoxia or hypercapnia: Secondary | ICD-10-CM | POA: Diagnosis not present

## 2020-02-24 ENCOUNTER — Telehealth: Payer: Self-pay | Admitting: Cardiology

## 2020-02-24 DIAGNOSIS — I10 Essential (primary) hypertension: Secondary | ICD-10-CM | POA: Diagnosis not present

## 2020-02-24 DIAGNOSIS — E1129 Type 2 diabetes mellitus with other diabetic kidney complication: Secondary | ICD-10-CM | POA: Diagnosis not present

## 2020-02-24 DIAGNOSIS — E11621 Type 2 diabetes mellitus with foot ulcer: Secondary | ICD-10-CM | POA: Diagnosis not present

## 2020-02-24 DIAGNOSIS — J9620 Acute and chronic respiratory failure, unspecified whether with hypoxia or hypercapnia: Secondary | ICD-10-CM | POA: Diagnosis not present

## 2020-02-24 NOTE — Telephone Encounter (Signed)
New message  Patient's son states that that the patient's daughter will be coming to appt with patient due to mobility issues.

## 2020-02-24 NOTE — Telephone Encounter (Signed)
Informed the pts Son that absolutely he or the daughter can accompany the pt to her upcoming appt with Dr. Meda Coffee on 5/11. Informed the pts Son that I updated this in appt notes. Son verbalized understanding and agrees with this plan.  Son was gracious for all the assistance provided.

## 2020-02-29 ENCOUNTER — Other Ambulatory Visit: Payer: Self-pay

## 2020-02-29 ENCOUNTER — Ambulatory Visit (INDEPENDENT_AMBULATORY_CARE_PROVIDER_SITE_OTHER): Payer: Medicare Other | Admitting: Cardiology

## 2020-02-29 ENCOUNTER — Encounter: Payer: Self-pay | Admitting: Cardiology

## 2020-02-29 VITALS — BP 138/70 | HR 66 | Ht 60.0 in

## 2020-02-29 DIAGNOSIS — I350 Nonrheumatic aortic (valve) stenosis: Secondary | ICD-10-CM

## 2020-02-29 DIAGNOSIS — J9621 Acute and chronic respiratory failure with hypoxia: Secondary | ICD-10-CM

## 2020-02-29 DIAGNOSIS — I5032 Chronic diastolic (congestive) heart failure: Secondary | ICD-10-CM | POA: Diagnosis not present

## 2020-02-29 DIAGNOSIS — I480 Paroxysmal atrial fibrillation: Secondary | ICD-10-CM

## 2020-02-29 NOTE — Patient Instructions (Signed)
Medication Instructions:   Your physician recommends that you continue on your current medications as directed. Please refer to the Current Medication list given to you today.  *If you need a refill on your cardiac medications before your next appointment, please call your pharmacy*   Follow-Up:  WITH DR. Meda Coffee IN PERSON ON April 12, 2020 AT ANY HELD SLOT--SCHEDULING PLEASE [place].  YOU CAN TAKE THE 7 DAY HOLD SPOT OR 48 HOUR ACUTE HOLD SPOT PER DR. Meda Coffee, ON THAT DAY.

## 2020-02-29 NOTE — Progress Notes (Addendum)
Cardiology Office Note:    Date:  02/29/2020   ID:  Ann Sellers, DOB May 25, 1925, MRN [id number]  PCP:  Reynold Bowen, MD  Cardiologist:  Ena Dawley, MD  Electrophysiologist:  None   Referring MD: Reynold Bowen, MD   Reason for Visit/CC: 3 months follow-up for severe aortic stenosis by recent hospitalization for aspiration pneumonia and acute hypoxic respiratory failure.   History of Present Illness:    Ann Sellers is a 86 y.o. female with a hx of CKD III, DM, HTN, anemia, asthma, aortic stenosis and chronic diastolic CHF, PAF (while hospitalized with pneumonia), she was not anticoagulated secondary to a history of recurrent epistaxis and anemia. The patient underwent TTE in July 2020 that showed that her aortic stenosis is now severe mean gradient increased from 17 to 38 mmHg, her LVEF remained normal.  Her echocardiogram has not been significantly changed when rechecked in March 2021 with normal LVEF and severe aortic stenosis. Have been following patient closely for severe aortic stenosis and acute on chronic diastolic CHF that was felt to be valvular in etiology.  However stress out all of that patient remain fully independent, living by herself cooking doing all the housework and she did not feel like she needed intervention such as TAVR at the time.  Unfortunately on December 26, 2019 she had an episode of nausea and vomiting after which she was hospitalized with acute respiratory failure with hypoxia, acute asthma exacerbation requiring therapy with BiPAP, she was discharged to cardiac rehab, currently at home with home BiPAP that she only uses infrequently and on 24/7 home oxygen on 2 L.  The patient continues home rehab and is slowly getting stronger, she still has ability to walk, however is not back to her baseline as she was prior to this hospitalization.  Past Medical History:  Diagnosis Date  . Anemia   . Arthritis   . Asthma   . B12 deficiency   . Chronic diastolic CHF  (congestive heart failure) ([place])   . CKD (chronic kidney disease), stage III   . Diabetes mellitus ([place])   . Hypertension   . Moderate aortic stenosis   . Neuromuscular disorder ([place])   . PAF (paroxysmal atrial fibrillation) ([place])   . Right rotator cuff tear   . Wears dentures    top denture-bottom partial  . Wears glasses   . Wears hearing aid    both ears    Past Surgical History:  Procedure Laterality Date  . APPENDECTOMY  1945  . CATARACT EXTRACTION, BILATERAL  2008  . cervical stynosis  2005  . esophageal stretch  2001  . [place]  . herniated disc  1983  . left hand surgery  2011   gout  . left knee replacement  2002  . left knee surgery  1998  . right wrist fracture  2006  . SHOULDER ARTHROSCOPY WITH ROTATOR CUFF REPAIR Right 01/05/2013   Procedure: SHOULDER ARTHROSCOPY WITH ROTATOR CUFF REPAIR subchromial decompression and distal clavical excision;  Surgeon: Lorn Junes, MD;  Location: [facility];  Service: Orthopedics;  Laterality: Right;  . tonsillectomy  1936  . TONSILLECTOMY    . TYMPANOSTOMY TUBE PLACEMENT  1989  . VESICOVAGINAL FISTULA CLOSURE W/ TAH  1968    Current Medications: Current Meds  Medication Sig  . albuterol (PROVENTIL HFA;VENTOLIN HFA) 108 (90 BASE) MCG/ACT inhaler Inhale 2 puffs into the lungs every 6 (six) hours as needed for wheezing or shortness  of breath.   Marland Kitchen albuterol (PROVENTIL) (2.5 MG/3ML) 0.083% nebulizer solution Take 2.5 mg by nebulization every 6 (six) hours as needed for shortness of breath.   . allopurinol (ZYLOPRIM) 100 MG tablet Take 0.5 tablets (50 mg total) by mouth every other day.  Marland Kitchen amLODipine (NORVASC) 5 MG tablet Take 1 tablet (5 mg total) by mouth daily.  Marland Kitchen aspirin 81 MG chewable tablet Chew 1 tablet (81 mg total) by mouth daily.  . calcitRIOL (ROCALTROL) 0.25 MCG capsule Take 0.25 mcg by mouth daily.  . calcium acetate (PHOSLO) 667 MG capsule Take 1 capsule (667 mg total) by mouth 3  (three) times daily with meals.  . colchicine 0.6 MG tablet Take 0.6 mg by mouth 2 (two) times daily as needed.  . diclofenac sodium (VOLTAREN) 1 % GEL Apply 2 g topically daily as needed (pain).  Marland Kitchen diphenoxylate-atropine (LOMOTIL) 2.5-0.025 MG per tablet Take 1 tablet by mouth daily as needed for diarrhea or loose stools.  . fluticasone (FLONASE) 50 MCG/ACT nasal spray Place 2 sprays into both nostrils daily.  Marland Kitchen gabapentin (NEURONTIN) 600 MG tablet Take 0.5 tablets (300 mg total) by mouth 2 (two) times daily.  Marland Kitchen HYDROcodone-acetaminophen (NORCO/VICODIN) 5-325 MG tablet Take 0.5-1 tablets by mouth every 6 (six) hours as needed for moderate pain or severe pain.  . iron polysaccharides (NIFEREX) 150 MG capsule Take 150 mg by mouth daily.  Marland Kitchen loratadine (CLARITIN) 10 MG tablet Take 1 tablet (10 mg total) by mouth daily.  . metoprolol succinate (TOPROL-XL) 50 MG 24 hr tablet TAKE 1 TABLET BY MOUTH EVERY DAY WITH FOOD OR IMMEDIATELY AFTER EATING  . mometasone-formoterol (DULERA) 200-5 MCG/ACT AERO Inhale 2 puffs into the lungs in the morning and at bedtime.  . montelukast (SINGULAIR) 10 MG tablet Take 10 mg by mouth at bedtime.  Marland Kitchen omeprazole (PRILOSEC) 20 MG capsule Take 20 mg by mouth at bedtime.   . patiromer (VELTASSA) 8.4 g packet Take 1 packet (8.4 g total) by mouth daily.  . predniSONE (DELTASONE) 10 MG tablet Take 10 mg by mouth 2 (two) times daily.  Marland Kitchen senna-docusate (SENOKOT-S) 8.6-50 MG tablet Take 1 tablet by mouth at bedtime as needed for mild constipation.  . sodium chloride (OCEAN) 0.65 % SOLN nasal spray Place 1 spray into both nostrils as needed for congestion.  Marland Kitchen tiZANidine (ZANAFLEX) 4 MG tablet Take 4 mg by mouth every 8 (eight) hours as needed for muscle spasms.   . Vitamin D, Ergocalciferol, (DRISDOL) 50000 units CAPS capsule Take 50,000 Units by mouth every Friday.      Allergies:   Losartan and Snake antivenin [antivenin crotalidae polyvalent (snake antivenin)]   Social History    Socioeconomic History  . Marital status: Widowed    Spouse name: Not on file  . Number of children: 6  . Years of education: Not on file  . Highest education level: Not on file  Occupational History  . Occupation: homemaker  Tobacco Use  . Smoking status: Former Smoker    Packs/day: 0.50    Years: 20.00    Pack years: 10.00    Types: Cigarettes    Quit date: 10/22/1987    Years since quitting: 32.3  . Smokeless tobacco: Never Used  Substance and Sexual Activity  . Alcohol use: Yes    Alcohol/week: 7.0 standard drinks    Types: 7 Glasses of wine per week    Comment: social drinker  . Drug use: No  . Sexual activity: Not on file  Other Topics Concern  . Not on file  Social History Narrative  . Not on file   Social Determinants of Health   Financial Resource Strain:   . Difficulty of Paying Living Expenses:   Food Insecurity:   . Worried About Charity fundraiser in the Last Year:   . Arboriculturist in the Last Year:   Transportation Needs:   . Film/video editor (Medical):   Marland Kitchen Lack of Transportation (Non-Medical):   Physical Activity:   . Days of Exercise per Week:   . Minutes of Exercise per Session:   Stress:   . Feeling of Stress :   Social Connections:   . Frequency of Communication with Friends and Family:   . Frequency of Social Gatherings with Friends and Family:   . Attends Religious Services:   . Active Member of Clubs or Organizations:   . Attends Archivist Meetings:   Marland Kitchen Marital Status:     Family History: The patient's family history includes Diabetes in her daughter, sister, and son; Heart disease in her sister; Hypertension in her sister, sister, and son; Pneumonia in her mother.  ROS:   Please see the history of present illness.    All other systems reviewed and are negative.  EKGs/Labs/Other Studies Reviewed:    The following studies were reviewed today:  Echocardiogram 01/08/2020  2. Left ventricular ejection fraction,  by estimation, is 65 to 70%. The  left ventricle has normal function. The left ventricle has no regional  wall motion abnormalities. There is moderate left ventricular hypertrophy.  Left ventricular diastolic  parameters are consistent with Grade I diastolic dysfunction (impaired  relaxation).  3. Right ventricular systolic function is normal. The right ventricular  size is normal. Tricuspid regurgitation signal is inadequate for assessing  PA pressure.  4. Left atrial size was mildly dilated.  5. The mitral valve is degenerative with moderate annular calcification.  Mild mitral valve regurgitation.  6. The aortic valve is tricuspid, moderately to severely calcified.  Aortic valve regurgitation is not visualized. Severe aortic valve  stenosis. Aortic valve area, by VTI measures 0.68 cm. Aortic valve mean  gradient measures 34.5 mmHg. Aortic valve Vmax  measures 4.00 m/s. Dimentionless index 0.24. Fairly similar to prior  study gradients from July 2020, but current parameters more consistent  with severe stenosis.  7. The inferior vena cava   EKG:  EKG is ordered today.  The ekg ordered today demonstrates normal sinus rhythm right bundle branch block, unchanged from prior, this was personally reviewed.  Recent Labs: 12/15/2019: NT-Pro BNP 2,498 01/03/2020: Magnesium 2.0 01/07/2020: ALT 13; TSH 1.619 01/08/2020: B Natriuretic Peptide 915.8 01/15/2020: BUN 56; Creatinine, Ser 1.79; Hemoglobin 8.7; Platelets 143; Potassium 4.7; Sodium 147   Recent Lipid Panel    Component Value Date/Time   CHOL 162 11/05/2017 1057   TRIG 118 11/05/2017 1057   HDL 58 11/05/2017 1057   CHOLHDL 2.8 11/05/2017 1057   CHOLHDL 2.7 02/02/2014 0355   VLDL 21 02/02/2014 0355   LDLCALC 80 11/05/2017 1057   Physical Exam:    VS:  BP 138/70   Pulse 66   Ht 5' (1.524 m)   SpO2 94%   BMI 36.12 kg/m     Wt Readings from Last 3 Encounters:  01/15/20 184 lb 15.5 oz (83.9 kg)  12/30/19 186 lb 15.2 oz  (84.8 kg)  12/15/19 178 lb 12.8 oz (81.1 kg)    GEN:  Well nourished, well developed  in no acute distress, on oxygen via nasal cannula, the patient is in wheelchair HEENT: Normal NECK: No JVD; No carotid bruits LYMPHATICS: No lymphadenopathy CARDIAC: RRR, 5 out of 6 holosystolic murmur, no S2, rubs, gallops RESPIRATORY:  Clear to auscultation without rales, wheezing or rhonchi  ABDOMEN: Soft, non-tender, non-distended MUSCULOSKELETAL:  No edema; No deformity  SKIN: Warm and dry NEUROLOGIC:  Alert and oriented x 3 PSYCHIATRIC:  Normal affect    ASSESSMENT:    1. Severe aortic stenosis   2. Chronic diastolic heart failure ([place])   3. Chronic diastolic HF (heart failure) ([place])   4. PAF (paroxysmal atrial fibrillation) ([place])   5. Acute on chronic respiratory failure with hypoxia ([facility])    PLAN:    In order of problems listed above:  Severe aortic Stenosis: mean transaortic gradient of 38 mmHg, she has no S2 on physical exam, she has been symptomatic with recurrent episodes of CHF, in the past she was offered TAVR, however she would be very high risk currently considering acute on chronic respiratory failure still requiring home oxygen, chronic kidney failure stage IV and deconditioning.   I am not sure if she will ever be fit enough to undergo TAVR, however based on her high functional status prior to this episode I will follow her closely, I will see her again on June 23 and if she improves we might consider referring her for TAVR evaluation.    Acute on chronic hypoxic respiratory failure, history of asthma with recent asthma exacerbation and aspiration pneumonia, she continues home O2, she is advised to have trials of weaning if her O2 sats remained above 90%.  Acute on chronic diastolic CHF -she appears euvolemic today.  Most recent creatinine 1.79, this is improved from 2.2.  Hypertension -currently controlled  PAF: In sinus rhythm today, she is not on anticoagulation secondary to  prior history of anemia and epistaxis.  Medication Adjustments/Labs and Tests Ordered: Current medicines are reviewed at length with the patient today.  Concerns regarding medicines are outlined above.  Orders Placed This Encounter  Procedures  . EKG 12-Lead   No orders of the defined types were placed in this encounter.   Patient Instructions  Medication Instructions:   Your physician recommends that you continue on your current medications as directed. Please refer to the Current Medication list given to you today.  *If you need a refill on your cardiac medications before your next appointment, please call your pharmacy*   Follow-Up:  WITH DR. Meda Coffee IN PERSON ON April 12, 2020 AT ANY HELD SLOT--SCHEDULING PLEASE [place].  YOU CAN TAKE THE 7 DAY HOLD SPOT OR 48 HOUR ACUTE HOLD SPOT PER DR. Meda Coffee, ON THAT DAY.      Signed, Ena Dawley, MD  02/29/2020 10:34 AM    [place]

## 2020-03-09 DIAGNOSIS — L089 Local infection of the skin and subcutaneous tissue, unspecified: Secondary | ICD-10-CM | POA: Diagnosis not present

## 2020-03-09 DIAGNOSIS — I130 Hypertensive heart and chronic kidney disease with heart failure and stage 1 through stage 4 chronic kidney disease, or unspecified chronic kidney disease: Secondary | ICD-10-CM | POA: Diagnosis not present

## 2020-03-09 DIAGNOSIS — E11621 Type 2 diabetes mellitus with foot ulcer: Secondary | ICD-10-CM | POA: Diagnosis not present

## 2020-03-09 DIAGNOSIS — I5032 Chronic diastolic (congestive) heart failure: Secondary | ICD-10-CM | POA: Diagnosis not present

## 2020-03-13 ENCOUNTER — Ambulatory Visit (INDEPENDENT_AMBULATORY_CARE_PROVIDER_SITE_OTHER): Payer: Medicare Other | Admitting: Orthopedic Surgery

## 2020-03-13 ENCOUNTER — Encounter: Payer: Self-pay | Admitting: Physician Assistant

## 2020-03-13 ENCOUNTER — Other Ambulatory Visit: Payer: Self-pay

## 2020-03-13 VITALS — Ht 60.0 in | Wt 184.0 lb

## 2020-03-13 DIAGNOSIS — L089 Local infection of the skin and subcutaneous tissue, unspecified: Secondary | ICD-10-CM | POA: Diagnosis not present

## 2020-03-13 DIAGNOSIS — E11621 Type 2 diabetes mellitus with foot ulcer: Secondary | ICD-10-CM | POA: Diagnosis not present

## 2020-03-13 DIAGNOSIS — L03031 Cellulitis of right toe: Secondary | ICD-10-CM | POA: Diagnosis not present

## 2020-03-13 MED ORDER — PENTOXIFYLLINE ER 400 MG PO TBCR: 400.0000 mg | EXTENDED_RELEASE_TABLET | Freq: Three times a day (TID) | ORAL | 3 refills | Status: AC

## 2020-03-13 MED ORDER — CIPROFLOXACIN HCL 500 MG PO TABS: 500.0000 mg | ORAL_TABLET | Freq: Two times a day (BID) | ORAL | 0 refills | Status: DC

## 2020-03-13 NOTE — Progress Notes (Signed)
Office Visit Note   Patient: Ann Sellers           Date of Birth: May 13, 1925           MRN: [id number] Visit Date: 03/13/2020              Requested by: Reynold Bowen, [employer],  [place] [address] PCP: Reynold Bowen, MD  Chief Complaint  Patient presents with  . Right Foot - Pain      HPI: Patient is a 86 year old woman who presents with cellulitis of the right great toe she is currently on doxycycline and using Bactroban dressing changes.  She had a toenail excised and afterward has developed some cellulitis.  Radiographs were negative for osteomyelitis.  Patient does have diabetes chronic kidney disease stage IV and she states her hemoglobin A1c runs between 6.4 and 7.3.  Assessment & Plan: Visit Diagnoses:  1. Cellulitis of great toe of right foot     Plan: Plan: Will add Cipro for gram-negative coverage will start Trental to improve the microcirculation also recommended a probiotic daily to minimize risk of C. difficile  Follow-Up Instructions: Return in about 1 week (around 03/20/2020).   Ortho Exam  Patient is alert, oriented, no adenopathy, well-dressed, normal affect, normal respiratory effort. Examination patient has a faint palpable dorsalis pedis and posterior tibial pulse the Doppler was used and she has a strong biphasic dorsalis pedis and posterior tibial pulse.  She does have venous stasis swelling in her leg there is cellulitis of the great toe with minimal tenderness to palpation there is no fluctuance no signs of abscess.  There is no ascending cellulitis.  Imaging: No results found. No images are attached to the encounter.  Labs: Lab Results  Component Value Date   HGBA1C 6.1 (H) 12/26/2019   HGBA1C 7.7 (H) 02/01/2014   REPTSTATUS 01/14/2020 FINAL 01/09/2020   CULT  01/09/2020    NO GROWTH 5 DAYS Performed at [facility], [place] [address], [place], [place] [address]      Lab Results  Component Value Date   ALBUMIN 3.2  (L) 01/07/2020   ALBUMIN 2.5 (L) 01/04/2020   ALBUMIN 2.4 (L) 01/03/2020    Lab Results  Component Value Date   MG 2.0 01/03/2020   MG 2.2 01/01/2020   MG 1.5 (L) 12/27/2019   Lab Results  Component Value Date   VD25OH 41.7 11/05/2017    No results found for: PREALBUMIN CBC EXTENDED Latest Ref Rng & Units 01/15/2020 01/13/2020 01/10/2020  WBC 4.0 - 10.5 K/uL 7.3 6.5 7.7  RBC 3.87 - 5.11 MIL/uL 2.89(L) 2.53(L) 2.90(L)  HGB 12.0 - 15.0 g/dL 8.7(L) 7.7(L) 9.0(L)  HCT 36.0 - 46.0 % 29.8(L) 25.5(L) 29.9(L)  PLT 150 - 400 K/uL 143(L) 136(L) 181  NEUTROABS 1.7 - 7.7 K/uL - 5.6 -  LYMPHSABS 0.7 - 4.0 K/uL - 0.5(L) -     Body mass index is 35.94 kg/m.  Orders:  No orders of the defined types were placed in this encounter.  No orders of the defined types were placed in this encounter.    Procedures: No procedures performed  Clinical Data: No additional findings.  ROS:  All other systems negative, except as noted in the HPI. Review of Systems  Objective: Vital Signs: Ht 5' (1.524 m)   Wt 184 lb (83.5 kg)   BMI 35.94 kg/m   Specialty Comments:  No specialty comments available.  PMFS History: Patient Active Problem List  Diagnosis Date Noted  . Altered mental status   . Acute renal failure superimposed on stage 3b chronic kidney disease ([place])   . Acute encephalopathy 01/03/2020  . Acute respiratory failure ([place])   . Pressure injury of skin 12/28/2019  . Pneumonia 12/26/2019  . PAF (paroxysmal atrial fibrillation) ([place]) 12/23/2016  . Aortic stenosis, moderate 09/16/2016  . Community acquired pneumonia 09/13/2016  . Lower extremity weakness 09/13/2016  . Chronic diastolic heart failure ([place]) 09/13/2016  . Severe persistent chronic asthma without complication 42/70/6237  . Morbid obesity ([place]) 07/06/2015  . Edema 07/22/2014  . Varicose veins of lower extremities with complications 62/83/1517  . Chronic kidney disease (CKD), stage IV (severe) ([place]) 02/01/2014    . Right rotator cuff tear   . Hypertension   . Arthritis   . Insulin dependent diabetes mellitus with complications   . Spinal stenosis   . Wears glasses   . Wears dentures   . Wears hearing aid   . Asthma 07/11/2011  . Vitamin D deficiency 07/11/2011  . Osteoarthritis 07/11/2011  . Cough 07/11/2011   Past Medical History:  Diagnosis Date  . Anemia   . Arthritis   . Asthma   . B12 deficiency   . Chronic diastolic CHF (congestive heart failure) ([place])   . CKD (chronic kidney disease), stage III   . Diabetes mellitus ([place])   . Hypertension   . Moderate aortic stenosis   . Neuromuscular disorder ([place])   . PAF (paroxysmal atrial fibrillation) ([place])   . Right rotator cuff tear   . Wears dentures    top denture-bottom partial  . Wears glasses   . Wears hearing aid    both ears    Family History  Problem Relation Age of Onset  . Pneumonia Mother   . Diabetes Son   . Hypertension Son   . Heart disease Sister   . Hypertension Sister   . Diabetes Sister   . Hypertension Sister   . Diabetes Daughter     Past Surgical History:  Procedure Laterality Date  . APPENDECTOMY  1945  . CATARACT EXTRACTION, BILATERAL  2008  . cervical stynosis  2005  . esophageal stretch  2001  . [place]  . herniated disc  1983  . left hand surgery  2011   gout  . left knee replacement  2002  . left knee surgery  1998  . right wrist fracture  2006  . SHOULDER ARTHROSCOPY WITH ROTATOR CUFF REPAIR Right 01/05/2013   Procedure: SHOULDER ARTHROSCOPY WITH ROTATOR CUFF REPAIR subchromial decompression and distal clavical excision;  Surgeon: Lorn Junes, MD;  Location: [facility];  Service: Orthopedics;  Laterality: Right;  . tonsillectomy  1936  . TONSILLECTOMY    . TYMPANOSTOMY TUBE PLACEMENT  1989  . VESICOVAGINAL FISTULA CLOSURE W/ TAH  1968   Social History   Occupational History  . Occupation: homemaker  Tobacco Use  . Smoking status: Former Smoker     Packs/day: 0.50    Years: 20.00    Pack years: 10.00    Types: Cigarettes    Quit date: 10/22/1987    Years since quitting: 32.4  . Smokeless tobacco: Never Used  Substance and Sexual Activity  . Alcohol use: Yes    Alcohol/week: 7.0 standard drinks    Types: 7 Glasses of wine per week    Comment: social drinker  . Drug use: No  . Sexual activity: Not on file

## 2020-03-15 DIAGNOSIS — M6281 Muscle weakness (generalized): Secondary | ICD-10-CM | POA: Diagnosis not present

## 2020-03-15 DIAGNOSIS — J9622 Acute and chronic respiratory failure with hypercapnia: Secondary | ICD-10-CM | POA: Diagnosis not present

## 2020-03-15 DIAGNOSIS — J4521 Mild intermittent asthma with (acute) exacerbation: Secondary | ICD-10-CM | POA: Diagnosis not present

## 2020-03-15 DIAGNOSIS — I5032 Chronic diastolic (congestive) heart failure: Secondary | ICD-10-CM | POA: Diagnosis not present

## 2020-03-21 ENCOUNTER — Ambulatory Visit (INDEPENDENT_AMBULATORY_CARE_PROVIDER_SITE_OTHER): Payer: Medicare Other | Admitting: Orthopedic Surgery

## 2020-03-21 ENCOUNTER — Other Ambulatory Visit: Payer: Self-pay

## 2020-03-21 ENCOUNTER — Encounter: Payer: Self-pay | Admitting: Family

## 2020-03-21 VITALS — Ht 60.0 in | Wt 184.0 lb

## 2020-03-21 DIAGNOSIS — L03031 Cellulitis of right toe: Secondary | ICD-10-CM | POA: Diagnosis not present

## 2020-03-30 ENCOUNTER — Other Ambulatory Visit: Payer: Self-pay | Admitting: Cardiology

## 2020-03-30 ENCOUNTER — Emergency Department (HOSPITAL_COMMUNITY): Payer: Medicare Other

## 2020-03-30 ENCOUNTER — Emergency Department (HOSPITAL_COMMUNITY)
Admission: EM | Admit: 2020-03-30 | Discharge: 2020-03-30 | Disposition: A | Discharge status: Home / Self Care | Payer: Medicare Other | Attending: Emergency Medicine | Admitting: Emergency Medicine

## 2020-03-30 DIAGNOSIS — I130 Hypertensive heart and chronic kidney disease with heart failure and stage 1 through stage 4 chronic kidney disease, or unspecified chronic kidney disease: Secondary | ICD-10-CM | POA: Diagnosis not present

## 2020-03-30 DIAGNOSIS — J45909 Unspecified asthma, uncomplicated: Secondary | ICD-10-CM | POA: Insufficient documentation

## 2020-03-30 DIAGNOSIS — N183 Chronic kidney disease, stage 3 unspecified: Secondary | ICD-10-CM | POA: Insufficient documentation

## 2020-03-30 DIAGNOSIS — W010XXA Fall on same level from slipping, tripping and stumbling without subsequent striking against object, initial encounter: Secondary | ICD-10-CM | POA: Insufficient documentation

## 2020-03-30 DIAGNOSIS — Y939 Activity, unspecified: Secondary | ICD-10-CM | POA: Diagnosis not present

## 2020-03-30 DIAGNOSIS — Y929 Unspecified place or not applicable: Secondary | ICD-10-CM | POA: Diagnosis not present

## 2020-03-30 DIAGNOSIS — Z87891 Personal history of nicotine dependence: Secondary | ICD-10-CM | POA: Insufficient documentation

## 2020-03-30 DIAGNOSIS — R519 Headache, unspecified: Secondary | ICD-10-CM | POA: Diagnosis not present

## 2020-03-30 DIAGNOSIS — W19XXXA Unspecified fall, initial encounter: Secondary | ICD-10-CM | POA: Diagnosis not present

## 2020-03-30 DIAGNOSIS — S0990XA Unspecified injury of head, initial encounter: Secondary | ICD-10-CM | POA: Diagnosis not present

## 2020-03-30 DIAGNOSIS — I10 Essential (primary) hypertension: Secondary | ICD-10-CM

## 2020-03-30 DIAGNOSIS — S0101XA Laceration without foreign body of scalp, initial encounter: Secondary | ICD-10-CM | POA: Diagnosis not present

## 2020-03-30 DIAGNOSIS — R000 Tachycardia, unspecified: Secondary | ICD-10-CM | POA: Diagnosis not present

## 2020-03-30 DIAGNOSIS — I5032 Chronic diastolic (congestive) heart failure: Secondary | ICD-10-CM | POA: Diagnosis not present

## 2020-03-30 DIAGNOSIS — Y999 Unspecified external cause status: Secondary | ICD-10-CM | POA: Insufficient documentation

## 2020-03-30 DIAGNOSIS — I5033 Acute on chronic diastolic (congestive) heart failure: Secondary | ICD-10-CM

## 2020-03-30 DIAGNOSIS — I350 Nonrheumatic aortic (valve) stenosis: Secondary | ICD-10-CM

## 2020-03-30 MED ORDER — LIDOCAINE-EPINEPHRINE (PF) 2 %-1:200000 IJ SOLN
20.0000 mL | Freq: Once | INTRAMUSCULAR | Status: AC
  Administered 2020-03-30: 20 mL

## 2020-03-30 MED ORDER — TETANUS-DIPHTH-ACELL PERTUSSIS 5-2.5-18.5 LF-MCG/0.5 IM SUSP
0.5000 mL | Freq: Once | INTRAMUSCULAR | Status: AC
  Administered 2020-03-30: 0.5 mL via INTRAMUSCULAR
  Filled 2020-03-30: qty 0.5

## 2020-03-30 NOTE — ED Notes (Signed)
EDP to bedside to suture

## 2020-03-30 NOTE — ED Triage Notes (Signed)
Pt bib ems from home with reports of fall and hitting her head. Pt with uncontrolled bleeding to head. Denies blood thinners. Possible LOC. Hypertensive with ems.

## 2020-03-30 NOTE — ED Provider Notes (Signed)
[facility] Emergency Department Provider Note MRN:  [id number]  Arrival date & time: 03/30/20     Chief Complaint   Fall   History of Present Illness   Ann Sellers is a 86 y.o. year-old female with a history of hypertension, diabetes presenting to the ED with chief complaint of fall.  Patient thinks that one of the legs of her walker caught on the edge of the counter causing her to fall backwards onto her head.  She denies any pain, she thinks she may have lost consciousness.  Profuse bleeding from the back of the scalp with EMS.  Denies neck or back pain, no chest pain or shortness of breath, no recent illness, no dizziness or chest pain prior to the fall.  Review of Systems  A complete 10 system review of systems was obtained and all systems are negative except as noted in the HPI and PMH.   Patient's Health History    Past Medical History:  Diagnosis Date  . Anemia   . Arthritis   . Asthma   . B12 deficiency   . Chronic diastolic CHF (congestive heart failure) ([place])   . CKD (chronic kidney disease), stage III   . Diabetes mellitus ([place])   . Hypertension   . Moderate aortic stenosis   . Neuromuscular disorder ([place])   . PAF (paroxysmal atrial fibrillation) ([place])   . Right rotator cuff tear   . Wears dentures    top denture-bottom partial  . Wears glasses   . Wears hearing aid    both ears    Past Surgical History:  Procedure Laterality Date  . APPENDECTOMY  1945  . CATARACT EXTRACTION, BILATERAL  2008  . cervical stynosis  2005  . esophageal stretch  2001  . [place]  . herniated disc  1983  . left hand surgery  2011   gout  . left knee replacement  2002  . left knee surgery  1998  . right wrist fracture  2006  . SHOULDER ARTHROSCOPY WITH ROTATOR CUFF REPAIR Right 01/05/2013   Procedure: SHOULDER ARTHROSCOPY WITH ROTATOR CUFF REPAIR subchromial decompression and distal clavical excision;  Surgeon: Lorn Junes,  MD;  Location: [facility];  Service: Orthopedics;  Laterality: Right;  . tonsillectomy  1936  . TONSILLECTOMY    . TYMPANOSTOMY TUBE PLACEMENT  1989  . VESICOVAGINAL FISTULA CLOSURE W/ TAH  1968    Family History  Problem Relation Age of Onset  . Pneumonia Mother   . Diabetes Son   . Hypertension Son   . Heart disease Sister   . Hypertension Sister   . Diabetes Sister   . Hypertension Sister   . Diabetes Daughter     Social History   Socioeconomic History  . Marital status: Widowed    Spouse name: Not on file  . Number of children: 6  . Years of education: Not on file  . Highest education level: Not on file  Occupational History  . Occupation: homemaker  Tobacco Use  . Smoking status: Former Smoker    Packs/day: 0.50    Years: 20.00    Pack years: 10.00    Types: Cigarettes    Quit date: 10/22/1987    Years since quitting: 32.4  . Smokeless tobacco: Never Used  Vaping Use  . Vaping Use: Never used  Substance and Sexual Activity  . Alcohol use: Yes    Alcohol/week: 7.0 standard drinks  Types: 7 Glasses of wine per week    Comment: social drinker  . Drug use: No  . Sexual activity: Not on file  Other Topics Concern  . Not on file  Social History Narrative  . Not on file   Social Determinants of Health   Financial Resource Strain:   . Difficulty of Paying Living Expenses:   Food Insecurity:   . Worried About Charity fundraiser in the Last Year:   . Arboriculturist in the Last Year:   Transportation Needs:   . Film/video editor (Medical):   Marland Kitchen Lack of Transportation (Non-Medical):   Physical Activity:   . Days of Exercise per Week:   . Minutes of Exercise per Session:   Stress:   . Feeling of Stress :   Social Connections:   . Frequency of Communication with Friends and Family:   . Frequency of Social Gatherings with Friends and Family:   . Attends Religious Services:   . Active Member of Clubs or Organizations:   . Attends Theatre manager Meetings:   Marland Kitchen Marital Status:   Intimate Partner Violence:   . Fear of Current or Ex-Partner:   . Emotionally Abused:   Marland Kitchen Physically Abused:   . Sexually Abused:      Physical Exam   Vitals:   03/30/20 2037 03/30/20 2127  BP:  (!) 132/102  Pulse: 61 86  Resp:  16  Temp:    SpO2: 99% 99%    CONSTITUTIONAL: Well-appearing, NAD, head soaked with blood NEURO: Hard of hearing but alert and oriented x3, no focal neurological deficits EYES:  eyes equal and reactive ENT/NECK:  no LAD, no JVD CARDIO: Regular rate, well-perfused, normal S1 and S2 PULM:  CTAB no wheezing or rhonchi GI/GU:  normal bowel sounds, non-distended, non-tender MSK/SPINE:  No gross deformities, no edema SKIN: 3 cm laceration to the parietal scalp with active pulsatile bleeding PSYCH:  Appropriate speech and behavior  *Additional and/or pertinent findings included in MDM below  Diagnostic and Interventional Summary    EKG Interpretation  Date/Time:    Ventricular Rate:    PR Interval:    QRS Duration:   QT Interval:    QTC Calculation:   R Axis:     Text Interpretation:        Labs Reviewed - No data to display  CT HEAD WO CONTRAST  Final Result    CT CERVICAL SPINE WO CONTRAST  Final Result      Medications  lidocaine-EPINEPHrine (XYLOCAINE W/EPI) 2 %-1:200000 (PF) injection 20 mL (20 mLs Other Given 03/30/20 1900)  Tdap (BOOSTRIX) injection 0.5 mL (0.5 mLs Intramuscular Given 03/30/20 1945)     Procedures  /  Critical Care .Marland KitchenLaceration Repair  Date/Time: 03/30/2020 9:29 PM Performed by: Maudie Flakes, MD Authorized by: Maudie Flakes, MD   Consent:    Consent obtained:  Emergent situation and verbal   Consent given by:  Patient   Risks discussed:  Pain and poor cosmetic result Anesthesia (see MAR for exact dosages):    Anesthesia method:  Topical application and local infiltration   Local anesthetic:  Lidocaine 1% WITH epi Laceration details:    Location:  Scalp    Scalp location:  L parietal   Length (cm):  3   Depth (mm):  2 Repair type:    Repair type:  Complex Exploration:    Hemostasis achieved with:  Tied off vessels   Wound exploration: wound explored  through full range of motion     Contaminated: no   Treatment:    Area cleansed with:  Saline   Debridement:  Minimal   Undermining:  Minimal Skin repair:    Repair method:  Sutures and staples   Suture size:  5-0   Wound skin closure material used: Fast-absorbing Vicryl.   Suture technique:  Figure eight   Number of sutures:  4   Number of staples:  5 Approximation:    Approximation:  Close Post-procedure details:    Dressing:  Open (no dressing)   Patient tolerance of procedure:  Tolerated well, no immediate complications Comments:     Persistent small arterial bleed on the internal edge of the scalp wound.  Greater than 20 minutes were required to remove the saturated overlying hair, attempt several figure-of-eight sutures.  Hemostasis achieved with figure-of-eight's.  Wound edges were approximated with Vicryl and then the entire wound was closed with staples.    ED Course and Medical Decision Making  I have reviewed the triage vital signs, the nursing notes, and pertinent available records from the EMR.  Listed above are laboratory and imaging tests that I personally ordered, reviewed, and interpreted and then considered in my medical decision making (see below for details).      Mechanical ground-level fall with laceration, complicated by a small arterial bleed.  See repair above, patient otherwise doing well without any complaints.  CT imaging is reassuring, patient seems to have a chronic effusion of the right ear, does not appear infected on my otoscopic exam, has been bothering her for over a month.  Advise ENT follow-up.    Barth Kirks. [place] Small, [place] [email or web address]  Final Clinical Impressions(s) / ED Diagnoses       ICD-10-CM   1. Fall, initial encounter  W19.XXXA   2. Laceration of scalp, initial encounter  [email or web address]     ED Discharge Orders    None       Discharge Instructions Discussed with and Provided to Patient:     Discharge Instructions     You were evaluated in the Emergency Department and after careful evaluation, we did not find any emergent condition requiring admission or further testing in the hospital.  Your exam/testing today was overall reassuring.  You had a complicated laceration to your scalp with a bleeding artery that we were able to repair.  Your CT imaging was reassuring, no internal bleeding.  You have some fluid on your right ear and we recommend follow-up with ENT if it continues to bother you.  Please return to the Emergency Department if you experience any worsening of your condition.  We encourage you to follow up with a primary care provider.  Thank you for allowing [place] to be a part of your care.        Maudie Flakes, MD 03/30/20 2135

## 2020-03-30 NOTE — Discharge Instructions (Signed)
You were evaluated in the Emergency Department and after careful evaluation, we did not find any emergent condition requiring admission or further testing in the hospital.  Your exam/testing today was overall reassuring.  You had a complicated laceration to your scalp with a bleeding artery that we were able to repair.  Your CT imaging was reassuring, no internal bleeding.  You have some fluid on your right ear and we recommend follow-up with ENT if it continues to bother you.  Please return to the Emergency Department if you experience any worsening of your condition.  We encourage you to follow up with a primary care provider.  Thank you for allowing [place] to be a part of your care.

## 2020-04-03 ENCOUNTER — Encounter: Payer: Self-pay | Admitting: Orthopedic Surgery

## 2020-04-03 NOTE — Progress Notes (Signed)
Office Visit Note   Patient: Ann Sellers           Date of Birth: 04/21/1925           MRN: [id number] Visit Date: 03/21/2020              Requested by: Reynold Bowen, [place],  [place] [id number] PCP: Reynold Bowen, MD  Chief Complaint  Patient presents with  . Right Foot - Follow-up    GT cellulitis       HPI: Patient is a 86 year old woman who presents in follow-up for cellulitis and paronychial infection right great toe.  She has completed her course of Cipro and Trental patient states she is on allopurinol 100 mg a day and is recovering from pneumonia.  Assessment & Plan: Visit Diagnoses:  1. Cellulitis of great toe of right foot     Plan: Continue with her normal activities no restrictions at this time no further intervention required for the right great toe  Follow-Up Instructions: Return if symptoms worsen or fail to improve.   Ortho Exam  Patient is alert, oriented, no adenopathy, well-dressed, normal affect, normal respiratory effort. Examination the paronychial infection and cellulitis has resolved around the right great toe she does have venous stasis swelling of both lower extremities with pitting edema patient states she cannot put on compression socks.  Imaging: No results found. No images are attached to the encounter.  Labs: Lab Results  Component Value Date   HGBA1C 6.1 (H) 12/26/2019   HGBA1C 7.7 (H) 02/01/2014   REPTSTATUS 01/14/2020 FINAL 01/09/2020   CULT  01/09/2020    NO GROWTH 5 DAYS Performed at [facility], [place] [address], [place], [place] [address]      Lab Results  Component Value Date   ALBUMIN 3.2 (L) 01/07/2020   ALBUMIN 2.5 (L) 01/04/2020   ALBUMIN 2.4 (L) 01/03/2020    Lab Results  Component Value Date   MG 2.0 01/03/2020   MG 2.2 01/01/2020   MG 1.5 (L) 12/27/2019   Lab Results  Component Value Date   VD25OH 41.7 11/05/2017    No results found for: PREALBUMIN CBC EXTENDED Latest Ref  Rng & Units 01/15/2020 01/13/2020 01/10/2020  WBC 4.0 - 10.5 K/uL 7.3 6.5 7.7  RBC 3.87 - 5.11 MIL/uL 2.89(L) 2.53(L) 2.90(L)  HGB 12.0 - 15.0 g/dL 8.7(L) 7.7(L) 9.0(L)  HCT 36 - 46 % 29.8(L) 25.5(L) 29.9(L)  PLT 150 - 400 K/uL 143(L) 136(L) 181  NEUTROABS 1.7 - 7.7 K/uL - 5.6 -  LYMPHSABS 0.7 - 4.0 K/uL - 0.5(L) -     Body mass index is 35.94 kg/m.  Orders:  No orders of the defined types were placed in this encounter.  No orders of the defined types were placed in this encounter.    Procedures: No procedures performed  Clinical Data: No additional findings.  ROS:  All other systems negative, except as noted in the HPI. Review of Systems  Objective: Vital Signs: Ht 5' (1.524 m)   Wt 184 lb (83.5 kg)   BMI 35.94 kg/m   Specialty Comments:  No specialty comments available.  PMFS History: Patient Active Problem List   Diagnosis Date Noted  . Altered mental status   . Acute renal failure superimposed on stage 3b chronic kidney disease ([place])   . Acute encephalopathy 01/03/2020  . Acute respiratory failure ([place])   . Pressure injury of skin 12/28/2019  . Pneumonia 12/26/2019  . PAF (  paroxysmal atrial fibrillation) ([place]) 12/23/2016  . Aortic stenosis, moderate 09/16/2016  . Community acquired pneumonia 09/13/2016  . Lower extremity weakness 09/13/2016  . Chronic diastolic heart failure ([place]) 09/13/2016  . Severe persistent chronic asthma without complication 63/84/6659  . Morbid obesity ([place]) 07/06/2015  . Edema 07/22/2014  . Varicose veins of lower extremities with complications 93/57/0177  . Chronic kidney disease (CKD), stage IV (severe) ([place]) 02/01/2014  . Right rotator cuff tear   . Hypertension   . Arthritis   . Insulin dependent diabetes mellitus with complications   . Spinal stenosis   . Wears glasses   . Wears dentures   . Wears hearing aid   . Asthma 07/11/2011  . Vitamin D deficiency 07/11/2011  . Osteoarthritis 07/11/2011  . Cough 07/11/2011    Past Medical History:  Diagnosis Date  . Anemia   . Arthritis   . Asthma   . B12 deficiency   . Chronic diastolic CHF (congestive heart failure) ([place])   . CKD (chronic kidney disease), stage III   . Diabetes mellitus ([place])   . Hypertension   . Moderate aortic stenosis   . Neuromuscular disorder ([place])   . PAF (paroxysmal atrial fibrillation) ([place])   . Right rotator cuff tear   . Wears dentures    top denture-bottom partial  . Wears glasses   . Wears hearing aid    both ears    Family History  Problem Relation Age of Onset  . Pneumonia Mother   . Diabetes Son   . Hypertension Son   . Heart disease Sister   . Hypertension Sister   . Diabetes Sister   . Hypertension Sister   . Diabetes Daughter     Past Surgical History:  Procedure Laterality Date  . APPENDECTOMY  1945  . CATARACT EXTRACTION, BILATERAL  2008  . cervical stynosis  2005  . esophageal stretch  2001  . [place]  . herniated disc  1983  . left hand surgery  2011   gout  . left knee replacement  2002  . left knee surgery  1998  . right wrist fracture  2006  . SHOULDER ARTHROSCOPY WITH ROTATOR CUFF REPAIR Right 01/05/2013   Procedure: SHOULDER ARTHROSCOPY WITH ROTATOR CUFF REPAIR subchromial decompression and distal clavical excision;  Surgeon: Lorn Junes, MD;  Location: [facility];  Service: Orthopedics;  Laterality: Right;  . tonsillectomy  1936  . TONSILLECTOMY    . TYMPANOSTOMY TUBE PLACEMENT  1989  . VESICOVAGINAL FISTULA CLOSURE W/ TAH  1968   Social History   Occupational History  . Occupation: homemaker  Tobacco Use  . Smoking status: Former Smoker    Packs/day: 0.50    Years: 20.00    Pack years: 10.00    Types: Cigarettes    Quit date: 10/22/1987    Years since quitting: 32.4  . Smokeless tobacco: Never Used  Vaping Use  . Vaping Use: Never used  Substance and Sexual Activity  . Alcohol use: Yes    Alcohol/week: 7.0 standard drinks    Types: 7  Glasses of wine per week    Comment: social drinker  . Drug use: No  . Sexual activity: Not on file

## 2020-04-10 DIAGNOSIS — W1830XA Fall on same level, unspecified, initial encounter: Secondary | ICD-10-CM | POA: Diagnosis not present

## 2020-04-10 DIAGNOSIS — M79674 Pain in right toe(s): Secondary | ICD-10-CM | POA: Diagnosis not present

## 2020-04-10 DIAGNOSIS — S0990XA Unspecified injury of head, initial encounter: Secondary | ICD-10-CM | POA: Diagnosis not present

## 2020-04-10 DIAGNOSIS — D631 Anemia in chronic kidney disease: Secondary | ICD-10-CM | POA: Diagnosis not present

## 2020-04-12 ENCOUNTER — Ambulatory Visit: Payer: Medicare Other | Admitting: Cardiology

## 2020-04-12 ENCOUNTER — Other Ambulatory Visit (HOSPITAL_COMMUNITY): Payer: Self-pay | Admitting: *Deleted

## 2020-04-13 ENCOUNTER — Ambulatory Visit (HOSPITAL_COMMUNITY)
Admission: RE | Admit: 2020-04-13 | Discharge: 2020-04-13 | Disposition: A | Discharge status: Home / Self Care | Payer: Medicare Other | Source: Ambulatory Visit | Attending: Endocrinology | Admitting: Endocrinology

## 2020-04-13 ENCOUNTER — Other Ambulatory Visit: Payer: Self-pay

## 2020-04-13 DIAGNOSIS — D649 Anemia, unspecified: Secondary | ICD-10-CM | POA: Diagnosis not present

## 2020-04-13 LAB — ABO/RH: ABO/RH(D): O POS

## 2020-04-13 LAB — PREPARE RBC (CROSSMATCH)

## 2020-04-13 MED ORDER — FUROSEMIDE 10 MG/ML IJ SOLN
INTRAMUSCULAR | Status: AC
  Filled 2020-04-13: qty 2

## 2020-04-13 MED ORDER — FUROSEMIDE 10 MG/ML IJ SOLN
20.0000 mg | Freq: Once | INTRAMUSCULAR | Status: AC
  Administered 2020-04-13: 20 mg via INTRAVENOUS

## 2020-04-13 MED ORDER — SODIUM CHLORIDE 0.9% IV SOLUTION: Freq: Once | INTRAVENOUS | Status: DC

## 2020-04-14 LAB — TYPE AND SCREEN
ABO/RH(D): O POS
Antibody Screen: NEGATIVE
Unit division: 0

## 2020-04-14 LAB — BPAM RBC
Blood Product Expiration Date: 202107232359
ISSUE DATE / TIME: 202106240951
Unit Type and Rh: 5100

## 2020-04-15 DIAGNOSIS — N183 Chronic kidney disease, stage 3 unspecified: Secondary | ICD-10-CM | POA: Diagnosis not present

## 2020-04-15 DIAGNOSIS — M6281 Muscle weakness (generalized): Secondary | ICD-10-CM | POA: Diagnosis not present

## 2020-04-15 DIAGNOSIS — I130 Hypertensive heart and chronic kidney disease with heart failure and stage 1 through stage 4 chronic kidney disease, or unspecified chronic kidney disease: Secondary | ICD-10-CM | POA: Diagnosis not present

## 2020-04-15 DIAGNOSIS — J9622 Acute and chronic respiratory failure with hypercapnia: Secondary | ICD-10-CM | POA: Diagnosis not present

## 2020-04-15 DIAGNOSIS — E1122 Type 2 diabetes mellitus with diabetic chronic kidney disease: Secondary | ICD-10-CM | POA: Diagnosis not present

## 2020-04-15 DIAGNOSIS — J4521 Mild intermittent asthma with (acute) exacerbation: Secondary | ICD-10-CM | POA: Diagnosis not present

## 2020-04-15 DIAGNOSIS — I5032 Chronic diastolic (congestive) heart failure: Secondary | ICD-10-CM | POA: Diagnosis not present

## 2020-04-17 DIAGNOSIS — D649 Anemia, unspecified: Secondary | ICD-10-CM | POA: Diagnosis not present

## 2020-05-09 DIAGNOSIS — D649 Anemia, unspecified: Secondary | ICD-10-CM | POA: Diagnosis not present

## 2020-05-09 DIAGNOSIS — E041 Nontoxic single thyroid nodule: Secondary | ICD-10-CM | POA: Diagnosis not present

## 2020-05-09 DIAGNOSIS — N184 Chronic kidney disease, stage 4 (severe): Secondary | ICD-10-CM | POA: Diagnosis not present

## 2020-05-09 DIAGNOSIS — E1129 Type 2 diabetes mellitus with other diabetic kidney complication: Secondary | ICD-10-CM | POA: Diagnosis not present

## 2020-05-11 DIAGNOSIS — D6869 Other thrombophilia: Secondary | ICD-10-CM | POA: Diagnosis not present

## 2020-05-11 DIAGNOSIS — E1129 Type 2 diabetes mellitus with other diabetic kidney complication: Secondary | ICD-10-CM | POA: Diagnosis not present

## 2020-05-12 DIAGNOSIS — H90A22 Sensorineural hearing loss, unilateral, left ear, with restricted hearing on the contralateral side: Secondary | ICD-10-CM | POA: Diagnosis not present

## 2020-05-12 DIAGNOSIS — H6521 Chronic serous otitis media, right ear: Secondary | ICD-10-CM | POA: Diagnosis not present

## 2020-05-12 DIAGNOSIS — H9113 Presbycusis, bilateral: Secondary | ICD-10-CM | POA: Diagnosis not present

## 2020-05-12 DIAGNOSIS — H90A31 Mixed conductive and sensorineural hearing loss, unilateral, right ear with restricted hearing on the contralateral side: Secondary | ICD-10-CM | POA: Diagnosis not present

## 2020-05-12 DIAGNOSIS — H90A11 Conductive hearing loss, unilateral, right ear with restricted hearing on the contralateral side: Secondary | ICD-10-CM | POA: Diagnosis not present

## 2020-05-15 DIAGNOSIS — J4521 Mild intermittent asthma with (acute) exacerbation: Secondary | ICD-10-CM | POA: Diagnosis not present

## 2020-05-15 DIAGNOSIS — I5032 Chronic diastolic (congestive) heart failure: Secondary | ICD-10-CM | POA: Diagnosis not present

## 2020-05-15 DIAGNOSIS — M6281 Muscle weakness (generalized): Secondary | ICD-10-CM | POA: Diagnosis not present

## 2020-05-15 DIAGNOSIS — J9622 Acute and chronic respiratory failure with hypercapnia: Secondary | ICD-10-CM | POA: Diagnosis not present

## 2020-05-16 DIAGNOSIS — H6521 Chronic serous otitis media, right ear: Secondary | ICD-10-CM | POA: Diagnosis not present

## 2020-05-29 ENCOUNTER — Other Ambulatory Visit: Payer: Self-pay | Admitting: Cardiology

## 2020-05-31 NOTE — Telephone Encounter (Signed)
Please forward this refill request to pts Nephrologist Dr. Elmarie Shiley, for he follows this medication, as outlined in copied message from Dr. Meda Coffee on 11/17/19 below:    11/17/19 5:24 PM Note Spoke to the pt and informed her that Dr. Meda Coffee reviewed her labs, and based on her kidney function and low calcium level, she will need to HOLD lasix for now, and start new medication PhosLo 667 mg po tid, and I will forward her result to Dr. Posey Pronto (Nephrologist) for approval of these orders and further follow-up.  Confirmed the pharmacy of choice with the pt.  Pt verbalized understanding and agrees with this plan.    Me     11/17/19 5:19 PM Note ----- Message from Dorothy Spark, MD sent at 11/17/2019  5:04 PM EST ----- Please hold her Lasix for now, please start her on PhosLo 667 mg 3 times daily, please send her results to her nephrologist Dr. Posey Pronto for approval of these orders.  BNP is still pending.

## 2020-05-31 NOTE — Telephone Encounter (Signed)
Pt's pharmacy is requesting a refill on calcium acetate. Would Dr. Meda Coffee like to refill this medication? Please address

## 2020-06-06 DIAGNOSIS — L03031 Cellulitis of right toe: Secondary | ICD-10-CM | POA: Diagnosis not present

## 2020-06-06 DIAGNOSIS — B351 Tinea unguium: Secondary | ICD-10-CM | POA: Diagnosis not present

## 2020-06-06 DIAGNOSIS — M109 Gout, unspecified: Secondary | ICD-10-CM | POA: Diagnosis not present

## 2020-06-06 DIAGNOSIS — M79674 Pain in right toe(s): Secondary | ICD-10-CM | POA: Diagnosis not present

## 2020-06-12 ENCOUNTER — Encounter: Payer: Self-pay | Admitting: Podiatry

## 2020-06-12 ENCOUNTER — Ambulatory Visit: Payer: Medicare Other | Admitting: Podiatry

## 2020-06-12 ENCOUNTER — Other Ambulatory Visit: Payer: Self-pay

## 2020-06-12 VITALS — Temp 97.0°F

## 2020-06-12 DIAGNOSIS — L03031 Cellulitis of right toe: Secondary | ICD-10-CM | POA: Diagnosis not present

## 2020-06-12 NOTE — Patient Instructions (Signed)

## 2020-06-12 NOTE — Progress Notes (Signed)
Subjective:   Patient ID: Ann Sellers, female   DOB: 86 y.o.   MRN: [id number]   HPI Patient presents with caregiver stating she has been having a lot of pain in the left big toenail and had it worked on by her family doctor in April but it remained a problem and has been on doxycycline recently.  Patient does not smoke likes to be active   Review of Systems  All other systems reviewed and are negative.       Objective:  Physical Exam Vitals and nursing note reviewed.  Constitutional:      Appearance: She is well-developed.  Pulmonary:     Effort: Pulmonary effort is normal.  Musculoskeletal:        General: Normal range of motion.  Skin:    General: Skin is warm.  Neurological:     Mental Status: She is alert.     Neurovascular status intact muscle strength found to be adequate range of motion adequate.  Patient's right hallux nail is very dystrophic and thickened and there is some redness on the medial side localized to this area with no proximal edema erythema or drainage noted     Assessment:  Paronychia type infection of the right hallux both medial lateral with patient is in relative poor health and has been having trouble with this for a few months     Plan:  H&P education to family rendered her do think she may have had some incident at her retirement home where the trauma occurred to this nailbed.  I did go ahead today infiltrated the right hallux 60 mg Xylocaine Marcaine mixture sterile prep applied to the toe and using sterile instrumentation I remove the hallux nail I flushed out the bed I did see there was some breaks in the nailbed surface but no bone exposure and I explained this type of infective process.  Antibiotics are good to be held off on now due to what I did today and sterile dressing applied and instructed to leave on for several days.  Patient will be seen back to recheck of any redness swelling or drainage were to occur and I made her caregiver were  aware of this

## 2020-06-15 ENCOUNTER — Telehealth: Payer: Self-pay

## 2020-06-15 DIAGNOSIS — M6281 Muscle weakness (generalized): Secondary | ICD-10-CM | POA: Diagnosis not present

## 2020-06-15 DIAGNOSIS — I5032 Chronic diastolic (congestive) heart failure: Secondary | ICD-10-CM | POA: Diagnosis not present

## 2020-06-15 DIAGNOSIS — J9622 Acute and chronic respiratory failure with hypercapnia: Secondary | ICD-10-CM | POA: Diagnosis not present

## 2020-06-15 DIAGNOSIS — J4521 Mild intermittent asthma with (acute) exacerbation: Secondary | ICD-10-CM | POA: Diagnosis not present

## 2020-06-15 NOTE — Telephone Encounter (Signed)
Pt's son called very concerned about his mothers right great toenail that removed on Monday 06/12/20. Pt son stated that the toe area is very red, getting a ton of drainage/oozing. The pt son also stated that they have been soaking the toe but the pain is still in a lot of pain.

## 2020-06-16 ENCOUNTER — Telehealth: Payer: Self-pay | Admitting: *Deleted

## 2020-06-16 DIAGNOSIS — N184 Chronic kidney disease, stage 4 (severe): Secondary | ICD-10-CM | POA: Diagnosis not present

## 2020-06-16 DIAGNOSIS — D631 Anemia in chronic kidney disease: Secondary | ICD-10-CM | POA: Diagnosis not present

## 2020-06-16 DIAGNOSIS — N189 Chronic kidney disease, unspecified: Secondary | ICD-10-CM | POA: Diagnosis not present

## 2020-06-16 DIAGNOSIS — I129 Hypertensive chronic kidney disease with stage 1 through stage 4 chronic kidney disease, or unspecified chronic kidney disease: Secondary | ICD-10-CM | POA: Diagnosis not present

## 2020-06-16 DIAGNOSIS — N2581 Secondary hyperparathyroidism of renal origin: Secondary | ICD-10-CM | POA: Diagnosis not present

## 2020-06-16 NOTE — Telephone Encounter (Signed)
Dr.Regal and Keely:  I called patient this morning and talked to her son. He stated that his mother's toe does look a lot better. He was concerned about the initial drainage she was having. I told him to continue to soak toe twice a day for two weeks, if after two weeks she still has drainage to continue soaking toe.  I also told him that if he felt his mother's toe was not improving, for him to make an appointment for her to come in and see Dr. Paulla Dolly.  Thank you, Rolly Pancake, CMA ([place])

## 2020-06-16 NOTE — Telephone Encounter (Signed)
Still early. If still giving problem next week have her come in to be checked

## 2020-06-16 NOTE — Telephone Encounter (Signed)
Returned patient's phone call and talked to patient's son regarding his mother's nail removal that she had done with Dr. Paulla Dolly on Monday, June 12, 2020.  Patient's son was worried about the drainage that was coming from nail. He stated, "My mom's nail was red and draining a light reddish color. It does look much better today though".  I told patient's son that it is normal to have drainage from this procedure and it can take up to 6-8 weeks to fully heal properly. I told him that his mother needs to soak their toe twice a day with epsom salt, rinse off with water, gently tap the nail because it will be tender to the touch and then put a little bit of neosporin on a band-aid and cover nail up.  I told patient that if his mother's nail gets worse, to please call our office and get her on Dr. Mellody Drown schedule to be seen.  Patient's son stated they understood.

## 2020-06-18 ENCOUNTER — Other Ambulatory Visit: Payer: Self-pay | Admitting: Orthopedic Surgery

## 2020-06-22 ENCOUNTER — Other Ambulatory Visit: Payer: Self-pay | Admitting: Cardiology

## 2020-06-23 ENCOUNTER — Other Ambulatory Visit: Payer: Self-pay

## 2020-06-23 ENCOUNTER — Emergency Department (HOSPITAL_COMMUNITY): Payer: Medicare Other

## 2020-06-23 ENCOUNTER — Inpatient Hospital Stay (HOSPITAL_COMMUNITY): Payer: Medicare Other

## 2020-06-23 ENCOUNTER — Inpatient Hospital Stay (HOSPITAL_COMMUNITY)
Admission: EM | Admit: 2020-06-23 | Discharge: 2020-07-21 | DRG: 177 | Disposition: E | Payer: Medicare Other | Attending: Internal Medicine | Admitting: Internal Medicine

## 2020-06-23 ENCOUNTER — Encounter (HOSPITAL_COMMUNITY): Payer: Self-pay | Admitting: Internal Medicine

## 2020-06-23 DIAGNOSIS — G9341 Metabolic encephalopathy: Secondary | ICD-10-CM | POA: Diagnosis present

## 2020-06-23 DIAGNOSIS — R0689 Other abnormalities of breathing: Secondary | ICD-10-CM | POA: Diagnosis not present

## 2020-06-23 DIAGNOSIS — E870 Hyperosmolality and hypernatremia: Secondary | ICD-10-CM | POA: Diagnosis present

## 2020-06-23 DIAGNOSIS — Z974 Presence of external hearing-aid: Secondary | ICD-10-CM

## 2020-06-23 DIAGNOSIS — J449 Chronic obstructive pulmonary disease, unspecified: Secondary | ICD-10-CM

## 2020-06-23 DIAGNOSIS — D509 Iron deficiency anemia, unspecified: Secondary | ICD-10-CM | POA: Diagnosis present

## 2020-06-23 DIAGNOSIS — H903 Sensorineural hearing loss, bilateral: Secondary | ICD-10-CM | POA: Diagnosis not present

## 2020-06-23 DIAGNOSIS — I480 Paroxysmal atrial fibrillation: Secondary | ICD-10-CM | POA: Diagnosis present

## 2020-06-23 DIAGNOSIS — E873 Alkalosis: Secondary | ICD-10-CM | POA: Diagnosis not present

## 2020-06-23 DIAGNOSIS — N184 Chronic kidney disease, stage 4 (severe): Secondary | ICD-10-CM | POA: Diagnosis present

## 2020-06-23 DIAGNOSIS — J9622 Acute and chronic respiratory failure with hypercapnia: Secondary | ICD-10-CM | POA: Diagnosis not present

## 2020-06-23 DIAGNOSIS — Z79899 Other long term (current) drug therapy: Secondary | ICD-10-CM

## 2020-06-23 DIAGNOSIS — I472 Ventricular tachycardia: Secondary | ICD-10-CM | POA: Diagnosis not present

## 2020-06-23 DIAGNOSIS — N390 Urinary tract infection, site not specified: Secondary | ICD-10-CM | POA: Diagnosis present

## 2020-06-23 DIAGNOSIS — Z20822 Contact with and (suspected) exposure to covid-19: Secondary | ICD-10-CM | POA: Diagnosis present

## 2020-06-23 DIAGNOSIS — I10 Essential (primary) hypertension: Secondary | ICD-10-CM | POA: Diagnosis not present

## 2020-06-23 DIAGNOSIS — Z7189 Other specified counseling: Secondary | ICD-10-CM | POA: Diagnosis not present

## 2020-06-23 DIAGNOSIS — J9621 Acute and chronic respiratory failure with hypoxia: Secondary | ICD-10-CM | POA: Diagnosis present

## 2020-06-23 DIAGNOSIS — Z7951 Long term (current) use of inhaled steroids: Secondary | ICD-10-CM

## 2020-06-23 DIAGNOSIS — Z66 Do not resuscitate: Secondary | ICD-10-CM

## 2020-06-23 DIAGNOSIS — R531 Weakness: Secondary | ICD-10-CM | POA: Diagnosis not present

## 2020-06-23 DIAGNOSIS — E875 Hyperkalemia: Secondary | ICD-10-CM | POA: Diagnosis present

## 2020-06-23 DIAGNOSIS — R0602 Shortness of breath: Secondary | ICD-10-CM | POA: Diagnosis not present

## 2020-06-23 DIAGNOSIS — R0902 Hypoxemia: Secondary | ICD-10-CM

## 2020-06-23 DIAGNOSIS — I350 Nonrheumatic aortic (valve) stenosis: Secondary | ICD-10-CM | POA: Diagnosis present

## 2020-06-23 DIAGNOSIS — E1165 Type 2 diabetes mellitus with hyperglycemia: Secondary | ICD-10-CM | POA: Diagnosis present

## 2020-06-23 DIAGNOSIS — H90A11 Conductive hearing loss, unilateral, right ear with restricted hearing on the contralateral side: Secondary | ICD-10-CM | POA: Diagnosis present

## 2020-06-23 DIAGNOSIS — I130 Hypertensive heart and chronic kidney disease with heart failure and stage 1 through stage 4 chronic kidney disease, or unspecified chronic kidney disease: Secondary | ICD-10-CM | POA: Diagnosis present

## 2020-06-23 DIAGNOSIS — Z7289 Other problems related to lifestyle: Secondary | ICD-10-CM

## 2020-06-23 DIAGNOSIS — H6521 Chronic serous otitis media, right ear: Secondary | ICD-10-CM | POA: Diagnosis present

## 2020-06-23 DIAGNOSIS — G9340 Encephalopathy, unspecified: Secondary | ICD-10-CM | POA: Diagnosis present

## 2020-06-23 DIAGNOSIS — D539 Nutritional anemia, unspecified: Secondary | ICD-10-CM | POA: Diagnosis present

## 2020-06-23 DIAGNOSIS — R109 Unspecified abdominal pain: Secondary | ICD-10-CM

## 2020-06-23 DIAGNOSIS — Z8249 Family history of ischemic heart disease and other diseases of the circulatory system: Secondary | ICD-10-CM

## 2020-06-23 DIAGNOSIS — Z515 Encounter for palliative care: Secondary | ICD-10-CM

## 2020-06-23 DIAGNOSIS — J189 Pneumonia, unspecified organism: Secondary | ICD-10-CM | POA: Diagnosis not present

## 2020-06-23 DIAGNOSIS — E1122 Type 2 diabetes mellitus with diabetic chronic kidney disease: Secondary | ICD-10-CM | POA: Diagnosis present

## 2020-06-23 DIAGNOSIS — Z1612 Extended spectrum beta lactamase (ESBL) resistance: Secondary | ICD-10-CM | POA: Diagnosis present

## 2020-06-23 DIAGNOSIS — N179 Acute kidney failure, unspecified: Secondary | ICD-10-CM | POA: Diagnosis not present

## 2020-06-23 DIAGNOSIS — R7989 Other specified abnormal findings of blood chemistry: Secondary | ICD-10-CM | POA: Diagnosis present

## 2020-06-23 DIAGNOSIS — Z9981 Dependence on supplemental oxygen: Secondary | ICD-10-CM

## 2020-06-23 DIAGNOSIS — I5032 Chronic diastolic (congestive) heart failure: Secondary | ICD-10-CM | POA: Diagnosis present

## 2020-06-23 DIAGNOSIS — Z833 Family history of diabetes mellitus: Secondary | ICD-10-CM

## 2020-06-23 DIAGNOSIS — G4733 Obstructive sleep apnea (adult) (pediatric): Secondary | ICD-10-CM | POA: Diagnosis present

## 2020-06-23 DIAGNOSIS — Z96652 Presence of left artificial knee joint: Secondary | ICD-10-CM | POA: Diagnosis present

## 2020-06-23 DIAGNOSIS — Z7982 Long term (current) use of aspirin: Secondary | ICD-10-CM

## 2020-06-23 DIAGNOSIS — A498 Other bacterial infections of unspecified site: Secondary | ICD-10-CM

## 2020-06-23 DIAGNOSIS — Z888 Allergy status to other drugs, medicaments and biological substances status: Secondary | ICD-10-CM

## 2020-06-23 DIAGNOSIS — J90 Pleural effusion, not elsewhere classified: Secondary | ICD-10-CM | POA: Diagnosis not present

## 2020-06-23 DIAGNOSIS — J690 Pneumonitis due to inhalation of food and vomit: Principal | ICD-10-CM

## 2020-06-23 DIAGNOSIS — Z87891 Personal history of nicotine dependence: Secondary | ICD-10-CM

## 2020-06-23 DIAGNOSIS — E119 Type 2 diabetes mellitus without complications: Secondary | ICD-10-CM

## 2020-06-23 DIAGNOSIS — E860 Dehydration: Secondary | ICD-10-CM | POA: Diagnosis present

## 2020-06-23 DIAGNOSIS — B961 Klebsiella pneumoniae [K. pneumoniae] as the cause of diseases classified elsewhere: Secondary | ICD-10-CM | POA: Diagnosis present

## 2020-06-23 DIAGNOSIS — R451 Restlessness and agitation: Secondary | ICD-10-CM | POA: Diagnosis not present

## 2020-06-23 LAB — CBC WITH DIFFERENTIAL/PLATELET
Abs Immature Granulocytes: 0.11 10*3/uL — ABNORMAL HIGH (ref 0.00–0.07)
Basophils Absolute: 0 10*3/uL (ref 0.0–0.1)
Basophils Relative: 0 %
Eosinophils Absolute: 0 10*3/uL (ref 0.0–0.5)
Eosinophils Relative: 0 %
HCT: 32.1 % — ABNORMAL LOW (ref 36.0–46.0)
Hemoglobin: 9 g/dL — ABNORMAL LOW (ref 12.0–15.0)
Immature Granulocytes: 1 %
Lymphocytes Relative: 5 %
Lymphs Abs: 0.5 10*3/uL — ABNORMAL LOW (ref 0.7–4.0)
MCH: 30.4 pg (ref 26.0–34.0)
MCHC: 28 g/dL — ABNORMAL LOW (ref 30.0–36.0)
MCV: 108.4 fL — ABNORMAL HIGH (ref 80.0–100.0)
Monocytes Absolute: 0.5 10*3/uL (ref 0.1–1.0)
Monocytes Relative: 6 %
Neutro Abs: 8.4 10*3/uL — ABNORMAL HIGH (ref 1.7–7.7)
Neutrophils Relative %: 88 %
Platelets: 150 10*3/uL (ref 150–400)
RBC: 2.96 MIL/uL — ABNORMAL LOW (ref 3.87–5.11)
RDW: 15.6 % — ABNORMAL HIGH (ref 11.5–15.5)
WBC: 9.6 10*3/uL (ref 4.0–10.5)
nRBC: 0 % (ref 0.0–0.2)

## 2020-06-23 LAB — URINALYSIS, ROUTINE W REFLEX MICROSCOPIC
Bilirubin Urine: NEGATIVE
Glucose, UA: NEGATIVE mg/dL
Hgb urine dipstick: NEGATIVE
Ketones, ur: NEGATIVE mg/dL
Leukocytes,Ua: NEGATIVE
Nitrite: NEGATIVE
Protein, ur: 100 mg/dL — AB
Specific Gravity, Urine: 1.015 (ref 1.005–1.030)
pH: 5 (ref 5.0–8.0)

## 2020-06-23 LAB — COMPREHENSIVE METABOLIC PANEL
ALT: 8 U/L (ref 0–44)
AST: 14 U/L — ABNORMAL LOW (ref 15–41)
Albumin: 3.6 g/dL (ref 3.5–5.0)
Alkaline Phosphatase: 62 U/L (ref 38–126)
Anion gap: 12 (ref 5–15)
BUN: 90 mg/dL — ABNORMAL HIGH (ref 8–23)
CO2: 23 mmol/L (ref 22–32)
Calcium: 10.1 mg/dL (ref 8.9–10.3)
Chloride: 103 mmol/L (ref 98–111)
Creatinine, Ser: 4.41 mg/dL — ABNORMAL HIGH (ref 0.44–1.00)
GFR calc Af Amer: 9 mL/min — ABNORMAL LOW (ref >60)
GFR calc non Af Amer: 8 mL/min — ABNORMAL LOW (ref >60)
Glucose, Bld: 159 mg/dL — ABNORMAL HIGH (ref 70–99)
Potassium: 6.5 mmol/L (ref 3.5–5.1)
Sodium: 138 mmol/L (ref 135–145)
Total Bilirubin: 0.5 mg/dL (ref 0.3–1.2)
Total Protein: 6.2 g/dL — ABNORMAL LOW (ref 6.5–8.1)

## 2020-06-23 LAB — SARS CORONAVIRUS 2 BY RT PCR (HOSPITAL ORDER, PERFORMED IN ~~LOC~~ HOSPITAL LAB): SARS Coronavirus 2: NEGATIVE

## 2020-06-23 LAB — I-STAT VENOUS BLOOD GAS, ED
Acid-Base Excess: 1 mmol/L (ref 0.0–2.0)
Bicarbonate: 24.7 mmol/L (ref 20.0–28.0)
Calcium, Ion: 0.99 mmol/L — ABNORMAL LOW (ref 1.15–1.40)
HCT: 30 % — ABNORMAL LOW (ref 36.0–46.0)
Hemoglobin: 10.2 g/dL — ABNORMAL LOW (ref 12.0–15.0)
O2 Saturation: 100 %
Potassium: 6 mmol/L — ABNORMAL HIGH (ref 3.5–5.1)
Sodium: 135 mmol/L (ref 135–145)
TCO2: 26 mmol/L (ref 22–32)
pCO2, Ven: 34.6 mmHg — ABNORMAL LOW (ref 44.0–60.0)
pH, Ven: 7.462 — ABNORMAL HIGH (ref 7.250–7.430)
pO2, Ven: 204 mmHg — ABNORMAL HIGH (ref 32.0–45.0)

## 2020-06-23 LAB — BRAIN NATRIURETIC PEPTIDE: B Natriuretic Peptide: 1661.8 pg/mL — ABNORMAL HIGH (ref 0.0–100.0)

## 2020-06-23 LAB — TROPONIN I (HIGH SENSITIVITY)
Troponin I (High Sensitivity): 91 ng/L — ABNORMAL HIGH (ref <18)
Troponin I (High Sensitivity): 94 ng/L — ABNORMAL HIGH (ref <18)
Troponin I (High Sensitivity): 175 ng/L (ref <18)

## 2020-06-23 LAB — LACTIC ACID, PLASMA: Lactic Acid, Venous: 0.9 mmol/L (ref 0.5–1.9)

## 2020-06-23 LAB — GLUCOSE, CAPILLARY: Glucose-Capillary: 240 mg/dL — ABNORMAL HIGH (ref 70–99)

## 2020-06-23 LAB — HIV ANTIBODY (ROUTINE TESTING W REFLEX): HIV Screen 4th Generation wRfx: NONREACTIVE

## 2020-06-23 MED ORDER — METRONIDAZOLE IN NACL 5-0.79 MG/ML-% IV SOLN
500.0000 mg | Freq: Once | INTRAVENOUS | Status: AC
  Administered 2020-06-23: 500 mg via INTRAVENOUS
  Filled 2020-06-23: qty 100

## 2020-06-23 MED ORDER — INSULIN ASPART 100 UNIT/ML IV SOLN
5.0000 [IU] | Freq: Once | INTRAVENOUS | Status: AC
  Administered 2020-06-23: 5 [IU] via INTRAVENOUS

## 2020-06-23 MED ORDER — SODIUM CHLORIDE 0.9 % IV SOLN
1.0000 g | INTRAVENOUS | Status: DC
  Administered 2020-06-24 – 2020-06-25 (×2): 1 g via INTRAVENOUS
  Filled 2020-06-23 (×3): qty 1

## 2020-06-23 MED ORDER — ASPIRIN 81 MG PO CHEW
81.0000 mg | CHEWABLE_TABLET | Freq: Every day | ORAL | Status: DC
  Administered 2020-06-23 – 2020-06-29 (×6): 81 mg via ORAL
  Filled 2020-06-23 (×6): qty 1

## 2020-06-23 MED ORDER — PENTOXIFYLLINE ER 400 MG PO TBCR
400.0000 mg | EXTENDED_RELEASE_TABLET | Freq: Three times a day (TID) | ORAL | Status: DC
  Administered 2020-06-24 – 2020-06-29 (×12): 400 mg via ORAL
  Filled 2020-06-23 (×16): qty 1

## 2020-06-23 MED ORDER — MONTELUKAST SODIUM 10 MG PO TABS
10.0000 mg | ORAL_TABLET | Freq: Every day | ORAL | Status: DC
  Administered 2020-06-25 – 2020-06-26 (×2): 10 mg via ORAL
  Filled 2020-06-23 (×5): qty 1

## 2020-06-23 MED ORDER — INSULIN ASPART 100 UNIT/ML ~~LOC~~ SOLN
0.0000 [IU] | Freq: Three times a day (TID) | SUBCUTANEOUS | Status: DC
  Administered 2020-06-24 (×3): 1 [IU] via SUBCUTANEOUS
  Administered 2020-06-25: 2 [IU] via SUBCUTANEOUS
  Administered 2020-06-25: 1 [IU] via SUBCUTANEOUS
  Administered 2020-06-28: 2 [IU] via SUBCUTANEOUS

## 2020-06-23 MED ORDER — METRONIDAZOLE IN NACL 5-0.79 MG/ML-% IV SOLN
500.0000 mg | Freq: Three times a day (TID) | INTRAVENOUS | Status: DC
  Administered 2020-06-23 – 2020-06-25 (×4): 500 mg via INTRAVENOUS
  Filled 2020-06-23 (×4): qty 100

## 2020-06-23 MED ORDER — SODIUM CHLORIDE 0.9 % IV SOLN: 1.0000 g | INTRAVENOUS | Status: DC

## 2020-06-23 MED ORDER — CALCIUM ACETATE (PHOS BINDER) 667 MG PO CAPS
667.0000 mg | ORAL_CAPSULE | Freq: Three times a day (TID) | ORAL | Status: DC
  Administered 2020-06-25 – 2020-06-27 (×7): 667 mg via ORAL
  Filled 2020-06-23 (×8): qty 1

## 2020-06-23 MED ORDER — MOMETASONE FURO-FORMOTEROL FUM 200-5 MCG/ACT IN AERO
2.0000 | INHALATION_SPRAY | Freq: Two times a day (BID) | RESPIRATORY_TRACT | Status: DC
  Administered 2020-06-24 – 2020-06-28 (×5): 2 via RESPIRATORY_TRACT
  Filled 2020-06-23: qty 8.8

## 2020-06-23 MED ORDER — ORAL CARE MOUTH RINSE
15.0000 mL | Freq: Two times a day (BID) | OROMUCOSAL | Status: DC
  Administered 2020-06-23 – 2020-06-29 (×6): 15 mL via OROMUCOSAL

## 2020-06-23 MED ORDER — VANCOMYCIN HCL IN DEXTROSE 1-5 GM/200ML-% IV SOLN
1000.0000 mg | Freq: Once | INTRAVENOUS | Status: AC
  Administered 2020-06-23: 1000 mg via INTRAVENOUS
  Filled 2020-06-23: qty 200

## 2020-06-23 MED ORDER — CALCIUM GLUCONATE-NACL 1-0.675 GM/50ML-% IV SOLN
1.0000 g | Freq: Once | INTRAVENOUS | Status: AC
  Administered 2020-06-23: 1000 mg via INTRAVENOUS
  Filled 2020-06-23: qty 50

## 2020-06-23 MED ORDER — SODIUM CHLORIDE 0.9 % IV SOLN
2.0000 g | Freq: Once | INTRAVENOUS | Status: AC
  Administered 2020-06-23: 2 g via INTRAVENOUS
  Filled 2020-06-23: qty 2

## 2020-06-23 MED ORDER — FLUTICASONE PROPIONATE 50 MCG/ACT NA SUSP
2.0000 | Freq: Every day | NASAL | Status: DC
  Administered 2020-06-24 – 2020-06-28 (×5): 2 via NASAL
  Filled 2020-06-23: qty 16

## 2020-06-23 MED ORDER — CALCIUM GLUCONATE 10 % IV SOLN: 1.0000 g | Freq: Once | INTRAVENOUS | Status: DC

## 2020-06-23 MED ORDER — IPRATROPIUM-ALBUTEROL 0.5-2.5 (3) MG/3ML IN SOLN
3.0000 mL | Freq: Four times a day (QID) | RESPIRATORY_TRACT | Status: DC
  Administered 2020-06-24 (×3): 3 mL via RESPIRATORY_TRACT
  Filled 2020-06-23 (×3): qty 3

## 2020-06-23 MED ORDER — LACTATED RINGERS IV SOLN: INTRAVENOUS | Status: DC

## 2020-06-23 MED ORDER — VANCOMYCIN HCL IN DEXTROSE 1-5 GM/200ML-% IV SOLN: 1000.0000 mg | INTRAVENOUS | Status: DC

## 2020-06-23 MED ORDER — DEXTROSE 50 % IV SOLN
1.0000 | Freq: Once | INTRAVENOUS | Status: AC
  Administered 2020-06-23: 50 mL via INTRAVENOUS
  Filled 2020-06-23: qty 50

## 2020-06-23 MED ORDER — ALBUTEROL SULFATE (2.5 MG/3ML) 0.083% IN NEBU
10.0000 mg | INHALATION_SOLUTION | Freq: Once | RESPIRATORY_TRACT | Status: AC
  Administered 2020-06-23: 10 mg via RESPIRATORY_TRACT
  Filled 2020-06-23: qty 12

## 2020-06-23 MED ORDER — SODIUM ZIRCONIUM CYCLOSILICATE 10 G PO PACK
10.0000 g | PACK | Freq: Once | ORAL | Status: AC
  Administered 2020-06-23: 10 g via ORAL
  Filled 2020-06-23: qty 1

## 2020-06-23 MED ORDER — METOPROLOL SUCCINATE ER 25 MG PO TB24: 12.5000 mg | ORAL_TABLET | Freq: Every day | ORAL | Status: DC

## 2020-06-23 MED ORDER — PANTOPRAZOLE SODIUM 40 MG PO TBEC
40.0000 mg | DELAYED_RELEASE_TABLET | Freq: Every day | ORAL | Status: DC
  Administered 2020-06-23 – 2020-06-29 (×6): 40 mg via ORAL
  Filled 2020-06-23 (×6): qty 1

## 2020-06-23 MED ORDER — SODIUM CHLORIDE 0.9 % IV BOLUS: 1000.0000 mL | Freq: Once | INTRAVENOUS | Status: DC

## 2020-06-23 MED ORDER — ACETAMINOPHEN 325 MG PO TABS
650.0000 mg | ORAL_TABLET | Freq: Once | ORAL | Status: AC
  Administered 2020-06-23: 650 mg via ORAL
  Filled 2020-06-23: qty 2

## 2020-06-23 MED ORDER — METOPROLOL SUCCINATE ER 25 MG PO TB24: 50.0000 mg | ORAL_TABLET | Freq: Every day | ORAL | Status: DC

## 2020-06-23 MED ORDER — HEPARIN SODIUM (PORCINE) 5000 UNIT/ML IJ SOLN
5000.0000 [IU] | Freq: Three times a day (TID) | INTRAMUSCULAR | Status: DC
  Administered 2020-06-23 – 2020-06-24 (×2): 5000 [IU] via SUBCUTANEOUS
  Filled 2020-06-23 (×2): qty 1

## 2020-06-23 MED ORDER — INSULIN ASPART 100 UNIT/ML ~~LOC~~ SOLN
0.0000 [IU] | Freq: Every day | SUBCUTANEOUS | Status: DC
  Administered 2020-06-23: 2 [IU] via SUBCUTANEOUS

## 2020-06-23 NOTE — ED Triage Notes (Signed)
Per [facility], pt arrives from home complaining of shortness of breath that started at 2am. EMS reports that pt is A&Ox4 at baseline but couldn't get out of bed this morning. Pt on 2L of O2 at home. Initial room air saturation 80%.

## 2020-06-23 NOTE — Progress Notes (Signed)
Pharmacy Antibiotic Note  Ann Sellers is a 86 y.o. female admitted on 07/04/2020 with sepsis.  Pharmacy has been consulted for vancomycin and cefepime dosing.    C/o shortness of breath, AMS. Tmax 100.4, RR 24. WBC 9.6. Lactic acid <1. SCr 4.4 (BL ~2), CrCl <10.  Plan: Give vancomycin 1,000mg  IV x1 Initiate vancomycin 1,000mg  IV every 48 hours. Goal trough 15-20. Initiate cefepime 1g IV every 24 hours    Temp (24hrs), Avg:100.1 F (37.8 C), Min:99.4 F (37.4 C), Max:100.4 F (38 C)  No results for input(s): WBC, CREATININE, LATICACIDVEN, VANCOTROUGH, VANCOPEAK, VANCORANDOM, GENTTROUGH, GENTPEAK, GENTRANDOM, TOBRATROUGH, TOBRAPEAK, TOBRARND, AMIKACINPEAK, AMIKACINTROU, AMIKACIN in the last 168 hours.  CrCl cannot be calculated (Patient's most recent lab result is older than the maximum 21 days allowed.).    Allergies  Allergen Reactions  . Losartan Cough  . Snake Antivenin [Antivenin Crotalidae Polyvalent (Snake Antivenin)] Other (See Comments)    PARALYSIS    Antimicrobials this admission: 9/3 vancomycin >>  9/3 cefepime >>  9/3 metronidazole x1  Dose adjustments this admission: N/a  Microbiology results: 9/3 BCx: pending 9/3 UCx: pending 9/3 COVID: negative  Mercy Riding, PharmD PGY1 Acute Care Pharmacy Resident Please refer to [facility] for unit-specific pharmacist

## 2020-06-23 NOTE — ED Notes (Addendum)
Pt to ED via EMS from home, c/o Ann Sellers, [place] and weakness. Per family these symptoms started this morning. Orientation at baseline. Pt home o2 user, at 2L; Pt did have surgery on her right toe yesterday. No medications given by EMS. Lat VS:  168/72, HR 66, RR22, 100% 2L, CBG 195. Temp 98.4 DNR form with pt.

## 2020-06-23 NOTE — Progress Notes (Signed)
Report received from ER RN.

## 2020-06-23 NOTE — ED Notes (Signed)
Patient transported to Ultrasound 

## 2020-06-23 NOTE — ED Notes (Signed)
Taken to CT.

## 2020-06-23 NOTE — Telephone Encounter (Signed)
Pt's pharmacy is requesting a refill on calcium. Would Dr. Meda Coffee like to refill this medication? Please address

## 2020-06-23 NOTE — H&P (Addendum)
History and Physical  Ann Sellers NOB:[id number] DOB: 1925/10/15 DOA: 06/21/2020  Referring physician: Dr. Tivis Ringer, [place]  PCP: Reynold Bowen, MD  Outpatient Specialists: Pulmonary, Dr. Melvyn Novas Patient coming from: Home  Chief Complaint: Low oxygen and confusion  HPI: Ann Sellers is a 86 y.o. female with medical history significant for essential HTN, type 2 diabetes, chronic diastolic CHF, asthma, chronic hypoxia on 2L continuously who presented to [facility] ED from home due to sudden onset confusion and increase oxygen requirement.  She was in her usual state of health prior to this.  Woke up this morning and was fixing breakfast with her son when suddenly she was noted to be confused.  Her oxygen level was 83% on 2 L, no hypoglycemia when checked at home.  EMS was called and patient was brought into the ED for further evaluation and management.  She has had occasional choking episodes in the past when she eats meats.  In the ED patient was somnolent but easily arousable to voices and confused.  ABG showed no evidence of hypercarbia with pH of 7.4.  UA negative.  CT head ordered and pending.  Chest x-ray completed with bilateral pulmonary infiltrates R>L and noted bilateral pleural effusion.  Was started on IV antibiotics empirically in the ED, cefepime, IV Flagyl and IV vancomycin.  TRH has been asked to admit.  ED Course: Per above.  Majority of history obtained from the patient's daughter at bedside due to the patient's altered mental status.  Review of Systems: Review of systems as noted in the HPI. All other systems reviewed and are negative.   Past Medical History:  Diagnosis Date  . Anemia   . Arthritis   . Asthma   . B12 deficiency   . Chronic diastolic CHF (congestive heart failure) ([place])   . CKD (chronic kidney disease), stage III   . Diabetes mellitus ([place])   . Hypertension   . Moderate aortic stenosis   . Neuromuscular disorder ([place])   . PAF (paroxysmal atrial fibrillation)  ([place])   . Right rotator cuff tear   . Wears dentures    top denture-bottom partial  . Wears glasses   . Wears hearing aid    both ears   Past Surgical History:  Procedure Laterality Date  . APPENDECTOMY  1945  . CATARACT EXTRACTION, BILATERAL  2008  . cervical stynosis  2005  . esophageal stretch  2001  . [place]  . herniated disc  1983  . left hand surgery  2011   gout  . left knee replacement  2002  . left knee surgery  1998  . right wrist fracture  2006  . SHOULDER ARTHROSCOPY WITH ROTATOR CUFF REPAIR Right 01/05/2013   Procedure: SHOULDER ARTHROSCOPY WITH ROTATOR CUFF REPAIR subchromial decompression and distal clavical excision;  Surgeon: Lorn Junes, MD;  Location: [place];  Service: Orthopedics;  Laterality: Right;  . tonsillectomy  1936  . TONSILLECTOMY    . TYMPANOSTOMY TUBE PLACEMENT  1989  . VESICOVAGINAL FISTULA CLOSURE W/ TAH  1968    Social History:  reports that she quit smoking about 32 years ago. Her smoking use included cigarettes. She has a 10.00 pack-year smoking history. She has never used smokeless tobacco. She reports current alcohol use of about 7.0 standard drinks of alcohol per week. She reports that she does not use drugs.   Allergies  Allergen Reactions  . Losartan Cough  . Snake Antivenin [Antivenin Crotalidae Polyvalent (  Snake Antivenin)] Other (See Comments)    PARALYSIS    Family History  Problem Relation Age of Onset  . Pneumonia Mother   . Diabetes Son   . Hypertension Son   . Heart disease Sister   . Hypertension Sister   . Diabetes Sister   . Hypertension Sister   . Diabetes Daughter       Prior to Admission medications   Medication Sig Start Date End Date Taking? Authorizing Provider  albuterol (PROVENTIL HFA;VENTOLIN HFA) 108 (90 BASE) MCG/ACT inhaler Inhale 2 puffs into the lungs every 6 (six) hours as needed for wheezing or shortness of breath.    Yes [provider]    albuterol (PROVENTIL) (2.5 MG/3ML) 0.083% nebulizer solution Take 2.5 mg by nebulization every 6 (six) hours as needed for shortness of breath.    Yes [provider]  allopurinol (ZYLOPRIM) 100 MG tablet Take 0.5 tablets (50 mg total) by mouth every other day. Patient taking differently: Take 100 mg by mouth 2 (two) times daily.  01/04/20  Yes Christian, Rylee, MD  amLODipine (NORVASC) 5 MG tablet Take 1 tablet (5 mg total) by mouth daily. 01/05/20  Yes Christian, Rylee, MD  aspirin 81 MG chewable tablet Chew 1 tablet (81 mg total) by mouth daily. 01/04/20  Yes Christian, Rylee, MD  calcitRIOL (ROCALTROL) 0.25 MCG capsule Take 0.25 mcg by mouth daily.   Yes [provider]  calcium acetate (PHOSLO) 667 MG capsule Take 1 capsule (667 mg total) by mouth 3 (three) times daily with meals. 11/17/19  Yes Dorothy Spark, MD  diclofenac sodium (VOLTAREN) 1 % GEL Apply 2 g topically daily as needed (pain).   Yes [provider]  diphenoxylate-atropine (LOMOTIL) 2.5-0.025 MG tablet Take 1 tablet by mouth 4 (four) times daily as needed for diarrhea or loose stools.   Yes [provider]  fluticasone (FLONASE) 50 MCG/ACT nasal spray Place 2 sprays into both nostrils daily. 07/08/17  Yes [provider]  furosemide (LASIX) 40 MG tablet Take 40 mg by mouth daily. 04/10/20  Yes [provider]  gabapentin (NEURONTIN) 300 MG capsule Take 300 mg by mouth 3 (three) times daily. 03/21/20  Yes [provider]  HYDROcodone-acetaminophen (NORCO/VICODIN) 5-325 MG tablet Take 0.5-1 tablets by mouth every 6 (six) hours as needed for moderate pain or severe pain. Patient taking differently: Take 1 tablet by mouth 3 (three) times daily as needed for moderate pain or severe pain.  01/17/20  Yes Christian, Rylee, MD  insulin glargine (LANTUS) 100 unit/mL SOPN Inject 10 Units into the skin at bedtime.   Yes [provider]  iron polysaccharides (NIFEREX) 150 MG  capsule Take 150 mg by mouth daily.   Yes [provider]  JANUVIA 50 MG tablet Take 50 mg by mouth daily. 06/22/20  Yes [provider]  loratadine (CLARITIN) 10 MG tablet Take 1 tablet (10 mg total) by mouth daily. 01/04/20  Yes Christian, Rylee, MD  metoprolol succinate (TOPROL-XL) 50 MG 24 hr tablet TAKE 1 TABLET BY MOUTH EVERY DAY WITH FOOD OR IMMEDIATELY AFTER EATING Patient taking differently: Take 50 mg by mouth daily.  11/22/19  Yes Dorothy Spark, MD  mometasone-formoterol [facility]) 200-5 MCG/ACT AERO Inhale 2 puffs into the lungs in the morning and at bedtime. Patient taking differently: Inhale 2 puffs into the lungs 2 (two) times daily as needed for wheezing or shortness of breath.  01/13/20  Yes Seawell, Jaimie A, DO  montelukast (SINGULAIR) 10 MG  tablet Take 10 mg by mouth at bedtime.   Yes [provider]  omeprazole (PRILOSEC) 20 MG capsule Take 20 mg by mouth 2 (two) times daily before a meal.  11/11/17  Yes [provider]  pentoxifylline (TRENTAL) 400 MG CR tablet Take 1 tablet (400 mg total) by mouth 3 (three) times daily with meals. 03/13/20  Yes Newt Minion, MD  Probiotic CAPS Take 1 capsule by mouth daily.   Yes [provider]  sodium chloride (OCEAN) 0.65 % SOLN nasal spray Place 1 spray into both nostrils as needed for congestion. 01/04/20  Yes Christian, Rylee, MD  Vitamin D, Ergocalciferol, (DRISDOL) 50000 units CAPS capsule Take 50,000 Units by mouth every 7 (seven) days. Take on Mondays 09/20/16  Yes [provider]  B-D ULTRAFINE III SHORT PEN 31G X 8 MM MISC Inject into the skin daily. 04/12/20   [provider]  gabapentin (NEURONTIN) 600 MG tablet Take 0.5 tablets (300 mg total) by mouth 2 (two) times daily. Patient not taking: Reported on 07/19/2020 01/04/20   Mitzi Hansen, MD  glucose blood (ONETOUCH VERIO) test strip Inject into the skin. 02/28/20   [provider]  patiromer (VELTASSA) 8.4 g  packet Take 1 packet (8.4 g total) by mouth daily. Patient not taking: Reported on 07/20/2020 01/13/20   Molli Hazard A, DO  senna-docusate (SENOKOT-S) 8.6-50 MG tablet Take 1 tablet by mouth at bedtime as needed for mild constipation. Patient not taking: Reported on 07/09/2020 01/04/20   Mitzi Hansen, MD    Physical Exam: BP (!) 131/50   Pulse 72   Temp (!) 100.4 F (38 C) (Rectal)   Resp 20   Wt 73.9 kg Comment: 7/21 at [facility]  SpO2 100%   BMI 31.82 kg/m   . General: 86 y.o. year-old female well developed well nourished in no acute distress.  Somnolent but easily arousable. . Cardiovascular: Regular rate and rhythm with no rubs or gallops.  No thyromegaly or JVD noted.  No lower extremity edema. 2/4 pulses in all 4 extremities. Marland Kitchen Respiratory: Diffuse rales bilaterally.  No wheezing noted.  Poor inspiratory effort.   . Abdomen: Soft nontender nondistended with normal bowel sounds x4 quadrants. . Muskuloskeletal: No cyanosis, clubbing or edema noted bilaterally . Neuro: CN II-XII intact, strength, sensation, reflexes . Skin: 1+ pitting edema in lower extremities bilaterally, erythema noted right greater than left.  Also warmth and some tenderness with palpation. Marland Kitchen Psychiatry: Judgement and insight appear altered. Mood is appropriate for condition and setting          Labs on Admission:  Basic Metabolic Panel: Recent Labs  Lab 07/09/2020 1411 06/21/2020 1537  NA 138 135  K 6.5* 6.0*  CL 103  --   CO2 23  --   GLUCOSE 159*  --   BUN 90*  --   CREATININE 4.41*  --   CALCIUM 10.1  --    Liver Function Tests: Recent Labs  Lab 07/15/2020 1411  AST 14*  ALT 8  ALKPHOS 62  BILITOT 0.5  PROT 6.2*  ALBUMIN 3.6   No results for input(s): LIPASE, AMYLASE in the last 168 hours. No results for input(s): AMMONIA in the last 168 hours. CBC: Recent Labs  Lab 07/20/2020 1411 06/26/2020 1537  WBC 9.6  --   NEUTROABS 8.4*  --   HGB 9.0* 10.2*  HCT 32.1* 30.0*  MCV 108.4*  --    PLT 150  --    Cardiac Enzymes:  No results for input(s): CKTOTAL, CKMB, CKMBINDEX, TROPONINI in the last 168 hours.  BNP (last 3 results) Recent Labs    01/08/20 0033 07/03/2020 1411  BNP 915.8* 1,661.8*    ProBNP (last 3 results) Recent Labs    11/17/19 0918 11/23/19 1251 12/15/19 1117  PROBNP 1,533* 3,200* 2,498*    CBG: No results for input(s): GLUCAP in the last 168 hours.  Radiological Exams on Admission: DG Chest Port 1 View  Result Date: 07/19/2020 CLINICAL DATA:  Sepsis, dyspnea, generalized weakness EXAM: PORTABLE CHEST 1 VIEW COMPARISON:  01/07/2020 FINDINGS: The lungs are symmetrically well expanded. Bibasilar airspace infiltrates are present, likely infectious or inflammatory in nature. Small left pleural effusion is present. No pneumothorax. Cardiac size is within normal limits, unchanged. Pulmonary vascularity is normal. No acute bone abnormality. IMPRESSION: Bibasilar airspace infiltrates, likely infectious or inflammatory in nature. Small left pleural possible parapneumonic effusion. Electronically Signed   By: Fidela Salisbury MD   On: 06/27/2020 15:57    EKG: I independently viewed the EKG done and my findings are as followed:  SR rate 67.  No specific ST T changes.  Assessment/Plan Present on Admission: . CAP (community acquired pneumonia)  Active Problems:   CAP (community acquired pneumonia)  RML Community-acquired pneumonia, suspect superimposed by possible aspiration, POA Started on empiric IV antibiotics in the ED Cefepime, IV Flagyl and IV vancomycin, day#1 Speech therapist consult Aspiration precautions Obtain sputum culture if possible Bronchodilators and pulmonary toilet Monitor fever curve and WBC  Elevated troponin, suspect demand ischemia in the setting of hypoxia No reported anginal symptoms Presented with troponin S 91, uptrending 94 Cycle troponin S x2 Repeat twelve-lead EKG Monitor on telemetry  AKI on CKD 4 Baseline creatinine  appears to be 1.7 with GFR of 24 Presented with creatinine of 4.4 with GFR of 8 Obtain renal ultrasound Avoid nephrotoxins Closely monitor urine output Obtain BMP in the morning  Acute on chronic hypoxic respiratory failure suspect secondary to above On 2 L nasal cannula continuously at baseline Currently requiring 3 L Wean off as tolerated Incentive spirometer  Acute hyperkalemia secondary to acute renal failure Presented with serum K+ 6.5 Not on potassium supplement at home, she is on home Lasix 40 mg daily Given continuous albuterol nebs, received IV calcium gluconate and Lokelma dose Repeat serum potassium Obtain BMP in the morning  Acute on chronic diastolic CHF Presented with cardiomegaly mild increase in pulmonary vascularity, bilateral pleural effusions and elevated BNP greater than 1600 Hold off Lasix due to AKI Last 2D echo done on March 2021 showed normal LVEF with grade 1 diastolic dysfunction Repeat 2D echo Monitor strict I's and O's and daily weight  Suspected right lower extremity cellulitis Continue IV abx Cellulitis order set Obtain blood cultures x2 peripherally  Type 2 diabetes with hyperglycemia Obtain A1c Start insulin sliding scale Hold off oral hypoglycemics  Chronic macrocytic anemia Presented with hemoglobin 9.0 and MCV 108 Hemoglobin uptrending 10.2 No overt bleeding Continue to monitor H&H  History of asthma Resume home regimen    DVT prophylaxis: Subcu heparin 3 times daily  Code Status: DNR  Family Communication: Updated daughter at bedside  Disposition Plan: Admit to telemetry medical  Consults called: None, consult nephrology in the morning  Admission status: Inpatient status.   Status is: Inpatient   Dispo: The patient is from: Home              Anticipated d/c is to: Home with home health services  Anticipated d/c date is: 06/26/20              Patient currently not medically stable for discharge due to  ongoing work-up and treatment.        Kayleen Memos MD [facility] Pager [phone]  If 7PM-7AM, please contact night-coverage [email or web address] Password [email or web address]  06/25/2020, 5:40 PM

## 2020-06-23 NOTE — ED Notes (Signed)
Pt returned from [place], daughter at bedside

## 2020-06-23 NOTE — ED Provider Notes (Addendum)
[facility] EMERGENCY DEPARTMENT Provider Note   CSN: [id number] Arrival date & time: 07/17/2020  1335     History Chief Complaint  Patient presents with  . Shortness of Breath    Ann Sellers is a 86 y.o. female.  HPI   86 year old female with a history of anemia, asthma, B12 deficiency, CHF, CKD, diabetes, hypertension, aortic stenosis, paroxysmal atrial fibrillation, who presents the emergency department today for evaluation of shortness of breath.  There is a level 5 caveat as patient appears confused and cannot give an accurate history  Additional history was obtained from EMS who states that patient started feeling short of breath around 2 AM.  She is normally alert and oriented at baseline but was unable to get out of bed this morning.  She is on 2 L O2 at baseline.  Past Medical History:  Diagnosis Date  . Anemia   . Arthritis   . Asthma   . B12 deficiency   . Chronic diastolic CHF (congestive heart failure) ([place])   . CKD (chronic kidney disease), stage III   . Diabetes mellitus ([place])   . Hypertension   . Moderate aortic stenosis   . Neuromuscular disorder ([place])   . PAF (paroxysmal atrial fibrillation) ([place])   . Right rotator cuff tear   . Wears dentures    top denture-bottom partial  . Wears glasses   . Wears hearing aid    both ears    Patient Active Problem List   Diagnosis Date Noted  . Conductive hearing loss of right ear with restricted hearing of left ear 05/12/2020  . Right chronic serous otitis media 05/12/2020  . Altered mental status   . Acute renal failure superimposed on stage 3b chronic kidney disease ([place])   . Acute encephalopathy 01/03/2020  . Acute respiratory failure ([place])   . Pressure injury of skin 12/28/2019  . Pneumonia 12/26/2019  . Presbycusis of both ears 04/04/2017  . PAF (paroxysmal atrial fibrillation) ([place]) 12/23/2016  . Aortic stenosis, moderate 09/16/2016  . Community acquired pneumonia 09/13/2016  .  Lower extremity weakness 09/13/2016  . Chronic diastolic heart failure ([place]) 09/13/2016  . Severe persistent chronic asthma without complication 42/68/3419  . Morbid obesity ([place]) 07/06/2015  . Edema 07/22/2014  . Varicose veins of lower extremities with complications 62/22/9798  . Chronic kidney disease (CKD), stage IV (severe) ([place]) 02/01/2014  . Right rotator cuff tear   . Hypertension   . Arthritis   . Insulin dependent diabetes mellitus with complications   . Spinal stenosis   . Wears glasses   . Wears dentures   . Wears hearing aid   . Asthma 07/11/2011  . Vitamin D deficiency 07/11/2011  . Osteoarthritis 07/11/2011  . Cough 07/11/2011    Past Surgical History:  Procedure Laterality Date  . APPENDECTOMY  1945  . CATARACT EXTRACTION, BILATERAL  2008  . cervical stynosis  2005  . esophageal stretch  2001  . [place]  . herniated disc  1983  . left hand surgery  2011   gout  . left knee replacement  2002  . left knee surgery  1998  . right wrist fracture  2006  . SHOULDER ARTHROSCOPY WITH ROTATOR CUFF REPAIR Right 01/05/2013   Procedure: SHOULDER ARTHROSCOPY WITH ROTATOR CUFF REPAIR subchromial decompression and distal clavical excision;  Surgeon: Lorn Junes, MD;  Location: [facility];  Service: Orthopedics;  Laterality: Right;  . tonsillectomy  1936  .  TONSILLECTOMY    . TYMPANOSTOMY TUBE PLACEMENT  1989  . VESICOVAGINAL FISTULA CLOSURE W/ TAH  1968     OB History   No obstetric history on file.     Family History  Problem Relation Age of Onset  . Pneumonia Mother   . Diabetes Son   . Hypertension Son   . Heart disease Sister   . Hypertension Sister   . Diabetes Sister   . Hypertension Sister   . Diabetes Daughter     Social History   Tobacco Use  . Smoking status: Former Smoker    Packs/day: 0.50    Years: 20.00    Pack years: 10.00    Types: Cigarettes    Quit date: 10/22/1987    Years since quitting: 32.6    . Smokeless tobacco: Never Used  Vaping Use  . Vaping Use: Never used  Substance Use Topics  . Alcohol use: Yes    Alcohol/week: 7.0 standard drinks    Types: 7 Glasses of wine per week    Comment: social drinker  . Drug use: No    Home Medications Prior to Admission medications   Medication Sig Start Date End Date Taking? Authorizing Provider  albuterol (PROVENTIL HFA;VENTOLIN HFA) 108 (90 BASE) MCG/ACT inhaler Inhale 2 puffs into the lungs every 6 (six) hours as needed for wheezing or shortness of breath.     [provider]  albuterol (PROVENTIL) (2.5 MG/3ML) 0.083% nebulizer solution Take 2.5 mg by nebulization every 6 (six) hours as needed for shortness of breath.     [provider]  allopurinol (ZYLOPRIM) 100 MG tablet Take 0.5 tablets (50 mg total) by mouth every other day. 01/04/20   Mitzi Hansen, MD  amLODipine (NORVASC) 5 MG tablet Take 1 tablet (5 mg total) by mouth daily. 01/05/20   Mitzi Hansen, MD  aspirin 81 MG chewable tablet Chew 1 tablet (81 mg total) by mouth daily. 01/04/20   Mitzi Hansen, MD  B-D ULTRAFINE III SHORT PEN 31G X 8 MM MISC Inject into the skin daily. 04/12/20   [provider]  calcitRIOL (ROCALTROL) 0.25 MCG capsule Take 0.25 mcg by mouth daily.    [provider]  calcium acetate (PHOSLO) 667 MG capsule Take 1 capsule (667 mg total) by mouth 3 (three) times daily with meals. 11/17/19   Dorothy Spark, MD  colchicine 0.6 MG tablet Take 0.6 mg by mouth 2 (two) times daily as needed. 02/18/20   [provider]  diclofenac sodium (VOLTAREN) 1 % GEL Apply 2 g topically daily as needed (pain).    [provider]  diphenoxylate-atropine (LOMOTIL) 2.5-0.025 MG per tablet Take 1 tablet by mouth daily as needed for diarrhea or loose stools.    [provider]  doxycycline (VIBRAMYCIN) 100 MG capsule Take 100 mg by mouth 2 (two) times daily. 06/06/20   [provider]  fluticasone  (FLONASE) 50 MCG/ACT nasal spray Place 2 sprays into both nostrils daily. 07/08/17   [provider]  furosemide (LASIX) 40 MG tablet Take 40 mg by mouth daily. 04/10/20   [provider]  gabapentin (NEURONTIN) 300 MG capsule Take 300 mg by mouth 3 (three) times daily. 03/21/20   [provider]  gabapentin (NEURONTIN) 600 MG tablet Take 0.5 tablets (300 mg total) by mouth 2 (two) times daily. 01/04/20   Christian, Rylee, MD  glucose blood (ONETOUCH VERIO) test strip Inject into the skin. 02/28/20   [provider]  HYDROcodone-acetaminophen (NORCO/VICODIN)  5-325 MG tablet Take 0.5-1 tablets by mouth every 6 (six) hours as needed for moderate pain or severe pain. 01/17/20   Mitzi Hansen, MD  iron polysaccharides (NIFEREX) 150 MG capsule Take 150 mg by mouth daily.    [provider]  loratadine (CLARITIN) 10 MG tablet Take 1 tablet (10 mg total) by mouth daily. 01/04/20   Mitzi Hansen, MD  metoprolol succinate (TOPROL-XL) 50 MG 24 hr tablet TAKE 1 TABLET BY MOUTH EVERY DAY WITH FOOD OR IMMEDIATELY AFTER EATING 11/22/19   Dorothy Spark, MD  mometasone-formoterol [facility]) 200-5 MCG/ACT AERO Inhale 2 puffs into the lungs in the morning and at bedtime. 01/13/20   Seawell, Jaimie A, DO  montelukast (SINGULAIR) 10 MG tablet Take 10 mg by mouth at bedtime.    [provider]  omeprazole (PRILOSEC) 20 MG capsule Take 20 mg by mouth at bedtime.  11/11/17   [provider]  patiromer (VELTASSA) 8.4 g packet Take 1 packet (8.4 g total) by mouth daily. 01/13/20   Seawell, Jaimie A, DO  pentoxifylline (TRENTAL) 400 MG CR tablet Take 1 tablet (400 mg total) by mouth 3 (three) times daily with meals. 03/13/20   Newt Minion, MD  senna-docusate (SENOKOT-S) 8.6-50 MG tablet Take 1 tablet by mouth at bedtime as needed for mild constipation. 01/04/20   Mitzi Hansen, MD  sodium chloride (OCEAN) 0.65 % SOLN nasal spray Place 1 spray into both nostrils as  needed for congestion. 01/04/20   Mitzi Hansen, MD  tiZANidine (ZANAFLEX) 4 MG tablet Take 4 mg by mouth every 8 (eight) hours as needed for muscle spasms.     [provider]  Vitamin D, Ergocalciferol, (DRISDOL) 50000 units CAPS capsule Take 50,000 Units by mouth every Friday.  09/20/16   [provider]    Allergies    Losartan and Snake antivenin [antivenin crotalidae polyvalent (snake antivenin)]  Review of Systems   Review of Systems    Physical Exam Updated Vital Signs BP (!) 86/53   Pulse 62   Temp (!) 100.4 F (38 C) (Rectal)   Resp (!) 21   Ht 5' (1.524 m)   Wt 83.6 kg   SpO2 97%   BMI 36.01 kg/m   Physical Exam Vitals and nursing note reviewed.  Constitutional:      General: She is not in acute distress.    Appearance: She is well-developed.  HENT:     Head: Normocephalic and atraumatic.     Mouth/Throat:     Mouth: Mucous membranes are dry.  Eyes:     Conjunctiva/sclera: Conjunctivae normal.  Cardiovascular:     Rate and Rhythm: Normal rate and regular rhythm.     Heart sounds: Murmur heard.   Pulmonary:     Breath sounds: Examination of the right-middle field reveals rales. Examination of the right-lower field reveals rales. Decreased breath sounds and rales present.     Comments: Conversationally dyspneic Abdominal:     Palpations: Abdomen is soft.     Tenderness: There is no abdominal tenderness. There is no guarding or rebound.  Musculoskeletal:     Cervical back: Neck supple.     Comments: Trace ble edema, no calf ttp. Right great toe with well healing wound  Skin:    General: Skin is warm and dry.  Neurological:     Mental Status: She is alert.     ED Results / Procedures / Treatments   Labs (all labs ordered are listed, but only abnormal  results are displayed) Labs Reviewed  COMPREHENSIVE METABOLIC PANEL - Abnormal; Notable for the following components:      Result Value   Potassium 6.5 (*)    Glucose, Bld 159 (*)      BUN 90 (*)    Creatinine, Ser 4.41 (*)    Total Protein 6.2 (*)    AST 14 (*)    GFR calc non Af Amer 8 (*)    GFR calc Af Amer 9 (*)    All other components within normal limits  CBC WITH DIFFERENTIAL/PLATELET - Abnormal; Notable for the following components:   RBC 2.96 (*)    Hemoglobin 9.0 (*)    HCT 32.1 (*)    MCV 108.4 (*)    MCHC 28.0 (*)    RDW 15.6 (*)    Neutro Abs 8.4 (*)    Lymphs Abs 0.5 (*)    Abs Immature Granulocytes 0.11 (*)    All other components within normal limits  URINALYSIS, ROUTINE W REFLEX MICROSCOPIC - Abnormal; Notable for the following components:   Protein, ur 100 (*)    Bacteria, UA MANY (*)    All other components within normal limits  I-STAT VENOUS BLOOD GAS, ED - Abnormal; Notable for the following components:   pH, Ven 7.462 (*)    pCO2, Ven 34.6 (*)    pO2, Ven 204.0 (*)    Potassium 6.0 (*)    Calcium, Ion 0.99 (*)    HCT 30.0 (*)    Hemoglobin 10.2 (*)    All other components within normal limits  TROPONIN I (HIGH SENSITIVITY) - Abnormal; Notable for the following components:   Troponin I (High Sensitivity) 91 (*)    All other components within normal limits  SARS CORONAVIRUS 2 BY RT PCR (HOSPITAL ORDER, [place])  CULTURE, BLOOD (ROUTINE X 2)  CULTURE, BLOOD (ROUTINE X 2)  URINE CULTURE  LACTIC ACID, PLASMA  PROTIME-INR  APTT  BRAIN NATRIURETIC PEPTIDE  TROPONIN I (HIGH SENSITIVITY)    EKG EKG Interpretation  Date/Time:  Friday June 23 2020 13:43:50 EDT Ventricular Rate:  67 PR Interval:    QRS Duration: 142 QT Interval:  395 QTC Calculation: 417 R Axis:   -63 Text Interpretation: Sinus rhythm suspected RBBB and LAFB Since prior ECG, rate has decreased, rhythm more consistent with sinus Confirmed by Gareth Morgan [phone]) on 06/28/2020 2:07:07 PM   Radiology DG Chest Port 1 View  Result Date: 06/29/2020 CLINICAL DATA:  Sepsis, dyspnea, generalized weakness EXAM: PORTABLE CHEST 1  VIEW COMPARISON:  01/07/2020 FINDINGS: The lungs are symmetrically well expanded. Bibasilar airspace infiltrates are present, likely infectious or inflammatory in nature. Small left pleural effusion is present. No pneumothorax. Cardiac size is within normal limits, unchanged. Pulmonary vascularity is normal. No acute bone abnormality. IMPRESSION: Bibasilar airspace infiltrates, likely infectious or inflammatory in nature. Small left pleural possible parapneumonic effusion. Electronically Signed   By: Fidela Salisbury MD   On: 07/13/2020 15:57    Procedures Procedures (including critical care time)  CRITICAL CARE Performed by: Rodney Booze   Total critical care time: 31 minutes  Critical care time was exclusive of separately billable procedures and treating other patients.  Critical care was necessary to treat or prevent imminent or life-threatening deterioration.  Critical care was time spent personally by me on the following activities: development of treatment plan with patient and/or surrogate as well as nursing, discussions with consultants, evaluation of patient's response to treatment, examination of  patient, obtaining history from patient or surrogate, ordering and performing treatments and interventions, ordering and review of laboratory studies, ordering and review of radiographic studies, pulse oximetry and re-evaluation of patient's condition.   Medications Ordered in ED Medications  ceFEPIme (MAXIPIME) 2 g in sodium chloride 0.9 % 100 mL IVPB (2 g Intravenous New Bag/Given  1551)  metroNIDAZOLE (FLAGYL) IVPB 500 mg (500 mg Intravenous New Bag/Given 07/15/2020 1612)  vancomycin (VANCOCIN) IVPB 1000 mg/200 mL premix (has no administration in time range)  albuterol (PROVENTIL) (2.5 MG/3ML) 0.083% nebulizer solution 10 mg (has no administration in time range)  acetaminophen (TYLENOL) tablet 650 mg (650 mg Oral Given 06/29/2020 1558)  insulin aspart (novoLOG) injection 5 Units (5  Units Intravenous Given 06/25/2020 1559)    And  dextrose 50 % solution 50 mL (50 mLs Intravenous Given 07/17/2020 1601)  sodium zirconium cyclosilicate (LOKELMA) packet 10 g (10 g Oral Given 06/21/2020 1559)    ED Course  I have reviewed the triage vital signs and the nursing notes.  Pertinent labs & imaging results that were available during my care of the patient were reviewed by me and considered in my medical decision making (see chart for details).  Clinical Course as of Jun 23 1613  Fri Jun 23, 2020  1546 WBC: 9.6 [CG]  1546 stable  Hemoglobin(!): 9.0 [CG]  1547 Sent for culture  Bacteria, UA(!): MANY [CG]  1547 SARS Coronavirus 2: NEGATIVE [CG]  1547 pH, Ven(!): 7.462 [CG]  1547 Troponin I (High Sensitivity)(!): 91 [CG]    Clinical Course User Index [CG] Arlean Hopping   MDM Rules/Calculators/A&P                          86 year old female presenting for evaluation of shortness of breath.  On my evaluation patient seems confused and is unable to provide significant history.  She is very tachypneic with conversation during my evaluation.  Patient found to be febrile, tachypneic with initial hypoxia that was noted however patient was not on her home O2.  Blood pressure stable and no evidence of tachycardia.  Sepsis work-up initiated due to concern for respiratory source.  Broad-spectrum antibiotics ordered.  Fluids held given history of CHF and hypertension noted initially.  Reviewed/interpreted lab CBC without leukocytosis, anemia present, improved from prior CMP with worsening creatinine, normal LFTs, elevated potassium at 6.5  -Patient given albuterol, insulin/dextrose, Lokelma Lactic acid negative UA without evidence of UTI Troponin marginally elevated, has had elevations in the past, suspect this is due to demand and may also have Trope leak due to worsening renal function VBG with alkalosis COVID neg  4:19 PM CONSULT with Dr. Nevada Crane with hospitalist service who  accepts patient for admission   Final Clinical Impression(s) / ED Diagnoses Final diagnoses:  Hypoxia    Rx / DC Orders ED Discharge Orders    None       Rodney Booze, PA-C 06/28/2020 1614    Sumeya Yontz S, PA-C 06/27/2020 1619    Gareth Morgan, MD 06/26/20 1029

## 2020-06-23 NOTE — Progress Notes (Signed)
New Admission Note:  Arrival Method: Stretcher Mental Orientation: Lethargic, Responds to voice, HOH with hearing aids.  Telemetry: Box 08 NSL.  Assessment: Completed Skin: Warm and dry.  IV: NSL's Pain: 0/0 Tubes: N/A Safety Measures: Safety Fall Prevention Plan initiated.  Admission: Completed 5 M  Orientation: Patient has been orientated to the room, unit and the staff. Welcome booklet given.  Family: DTR   Orders have been reviewed and implemented. Will continue to monitor the patient. Call light has been placed within reach and bed alarm has been activated.   Sima Matas BSN, RN  Phone Number: [phone]

## 2020-06-24 DIAGNOSIS — J690 Pneumonitis due to inhalation of food and vomit: Secondary | ICD-10-CM | POA: Diagnosis not present

## 2020-06-24 DIAGNOSIS — N179 Acute kidney failure, unspecified: Secondary | ICD-10-CM

## 2020-06-24 DIAGNOSIS — R0902 Hypoxemia: Secondary | ICD-10-CM

## 2020-06-24 DIAGNOSIS — Z20822 Contact with and (suspected) exposure to covid-19: Secondary | ICD-10-CM | POA: Diagnosis not present

## 2020-06-24 DIAGNOSIS — Z66 Do not resuscitate: Secondary | ICD-10-CM | POA: Diagnosis not present

## 2020-06-24 DIAGNOSIS — G9341 Metabolic encephalopathy: Secondary | ICD-10-CM | POA: Diagnosis not present

## 2020-06-24 LAB — VITAMIN B12: Vitamin B-12: >7500 pg/mL — ABNORMAL HIGH (ref 180–914)

## 2020-06-24 LAB — CBC WITH DIFFERENTIAL/PLATELET
Abs Immature Granulocytes: 0.03 10*3/uL (ref 0.00–0.07)
Basophils Absolute: 0 10*3/uL (ref 0.0–0.1)
Basophils Relative: 0 %
Eosinophils Absolute: 0 10*3/uL (ref 0.0–0.5)
Eosinophils Relative: 0 %
HCT: 22.9 % — ABNORMAL LOW (ref 36.0–46.0)
Hemoglobin: 6.8 g/dL — CL (ref 12.0–15.0)
Immature Granulocytes: 1 %
Lymphocytes Relative: 7 %
Lymphs Abs: 0.5 10*3/uL — ABNORMAL LOW (ref 0.7–4.0)
MCH: 30.4 pg (ref 26.0–34.0)
MCHC: 29.7 g/dL — ABNORMAL LOW (ref 30.0–36.0)
MCV: 102.2 fL — ABNORMAL HIGH (ref 80.0–100.0)
Monocytes Absolute: 0.4 10*3/uL (ref 0.1–1.0)
Monocytes Relative: 6 %
Neutro Abs: 5.5 10*3/uL (ref 1.7–7.7)
Neutrophils Relative %: 86 %
Platelets: 110 10*3/uL — ABNORMAL LOW (ref 150–400)
RBC: 2.24 MIL/uL — ABNORMAL LOW (ref 3.87–5.11)
RDW: 15.4 % (ref 11.5–15.5)
WBC: 6.4 10*3/uL (ref 4.0–10.5)
nRBC: 0 % (ref 0.0–0.2)

## 2020-06-24 LAB — HEMOGLOBIN A1C
Hgb A1c MFr Bld: 5.7 % — ABNORMAL HIGH (ref 4.8–5.6)
Mean Plasma Glucose: 116.89 mg/dL

## 2020-06-24 LAB — BASIC METABOLIC PANEL
Anion gap: 12 (ref 5–15)
BUN: 94 mg/dL — ABNORMAL HIGH (ref 8–23)
CO2: 25 mmol/L (ref 22–32)
Calcium: 9.4 mg/dL (ref 8.9–10.3)
Chloride: 102 mmol/L (ref 98–111)
Creatinine, Ser: 4.86 mg/dL — ABNORMAL HIGH (ref 0.44–1.00)
GFR calc Af Amer: 8 mL/min — ABNORMAL LOW (ref >60)
GFR calc non Af Amer: 7 mL/min — ABNORMAL LOW (ref >60)
Glucose, Bld: 197 mg/dL — ABNORMAL HIGH (ref 70–99)
Potassium: 4.6 mmol/L (ref 3.5–5.1)
Sodium: 139 mmol/L (ref 135–145)

## 2020-06-24 LAB — RETICULOCYTES
Immature Retic Fract: 19.2 % — ABNORMAL HIGH (ref 2.3–15.9)
RBC.: 2.27 MIL/uL — ABNORMAL LOW (ref 3.87–5.11)
Retic Count, Absolute: 29.5 10*3/uL (ref 19.0–186.0)
Retic Ct Pct: 1.3 % (ref 0.4–3.1)

## 2020-06-24 LAB — IRON AND TIBC
Iron: 14 ug/dL — ABNORMAL LOW (ref 28–170)
Saturation Ratios: 6 % — ABNORMAL LOW (ref 10.4–31.8)
TIBC: 223 ug/dL — ABNORMAL LOW (ref 250–450)
UIBC: 209 ug/dL

## 2020-06-24 LAB — CBC
HCT: 22.9 % — ABNORMAL LOW (ref 36.0–46.0)
Hemoglobin: 6.9 g/dL — CL (ref 12.0–15.0)
MCH: 30.4 pg (ref 26.0–34.0)
MCHC: 30.1 g/dL (ref 30.0–36.0)
MCV: 100.9 fL — ABNORMAL HIGH (ref 80.0–100.0)
Platelets: 113 10*3/uL — ABNORMAL LOW (ref 150–400)
RBC: 2.27 MIL/uL — ABNORMAL LOW (ref 3.87–5.11)
RDW: 15.5 % (ref 11.5–15.5)
WBC: 6.3 10*3/uL (ref 4.0–10.5)
nRBC: 0 % (ref 0.0–0.2)

## 2020-06-24 LAB — GLUCOSE, CAPILLARY
Glucose-Capillary: 193 mg/dL — ABNORMAL HIGH (ref 70–99)
Glucose-Capillary: 186 mg/dL — ABNORMAL HIGH (ref 70–99)
Glucose-Capillary: 145 mg/dL — ABNORMAL HIGH (ref 70–99)
Glucose-Capillary: 129 mg/dL — ABNORMAL HIGH (ref 70–99)
Glucose-Capillary: 122 mg/dL — ABNORMAL HIGH (ref 70–99)
Glucose-Capillary: 143 mg/dL — ABNORMAL HIGH (ref 70–99)

## 2020-06-24 LAB — APTT: aPTT: 39 seconds — ABNORMAL HIGH (ref 24–36)

## 2020-06-24 LAB — FOLATE: Folate: 15.9 ng/mL (ref >5.9)

## 2020-06-24 LAB — PROTIME-INR
INR: 1.1 (ref 0.8–1.2)
Prothrombin Time: 13.9 seconds (ref 11.4–15.2)

## 2020-06-24 LAB — CK: Total CK: 43 U/L (ref 38–234)

## 2020-06-24 LAB — TROPONIN I (HIGH SENSITIVITY): Troponin I (High Sensitivity): 207 ng/L (ref <18)

## 2020-06-24 LAB — PREPARE RBC (CROSSMATCH)

## 2020-06-24 LAB — FERRITIN: Ferritin: 68 ng/mL (ref 11–307)

## 2020-06-24 MED ORDER — CHLORHEXIDINE GLUCONATE CLOTH 2 % EX PADS
6.0000 | MEDICATED_PAD | Freq: Every day | CUTANEOUS | Status: DC
  Administered 2020-06-24 – 2020-06-26 (×3): 6 via TOPICAL

## 2020-06-24 MED ORDER — SODIUM CHLORIDE 0.9% IV SOLUTION: Freq: Once | INTRAVENOUS | Status: DC

## 2020-06-24 MED ORDER — ACETAMINOPHEN 325 MG PO TABS
650.0000 mg | ORAL_TABLET | Freq: Four times a day (QID) | ORAL | Status: DC | PRN
  Administered 2020-06-24: 650 mg via ORAL
  Filled 2020-06-24: qty 2

## 2020-06-24 MED ORDER — METOPROLOL SUCCINATE ER 25 MG PO TB24
25.0000 mg | ORAL_TABLET | Freq: Every day | ORAL | Status: DC
  Administered 2020-06-24 – 2020-06-29 (×6): 25 mg via ORAL
  Filled 2020-06-24 (×6): qty 1

## 2020-06-24 MED ORDER — SODIUM CHLORIDE 0.9 % IV BOLUS
250.0000 mL | Freq: Once | INTRAVENOUS | Status: AC
  Administered 2020-06-24: 250 mL via INTRAVENOUS

## 2020-06-24 MED ORDER — SODIUM CHLORIDE 0.9 % IV SOLN: INTRAVENOUS | Status: DC

## 2020-06-24 NOTE — Progress Notes (Signed)
PT Cancellation Note  Patient Details Name: Ann Sellers MRN: [id number] DOB: 1924-11-24   Cancelled Treatment:    Reason Eval/Treat Not Completed: Medical issues which prohibited therapy. The pt's most recent lab values show Hgb <7g/dL and HCT <25%, is currently receiving blood, and is also currently delirious. After conversation with the pt's RN, PT will wait to evaluate the pt until she is more medically ready and stable for mobility.   Karma Ganja, PT, DPT   Acute Rehabilitation Department Pager #: [phone]  Otho Bellows 06/24/2020, 4:19 PM

## 2020-06-24 NOTE — Evaluation (Signed)
Clinical/Bedside Swallow Evaluation Patient Details  Name: Ann Sellers MRN: [id number] Date of Birth: 08/04/1925  Today's Date: 06/24/2020 Time: SLP Start Time (ACUTE ONLY): 55 SLP Stop Time (ACUTE ONLY): 1150 SLP Time Calculation (min) (ACUTE ONLY): 20 min  Past Medical History:  Past Medical History:  Diagnosis Date  . Anemia   . Arthritis   . Asthma   . B12 deficiency   . Chronic diastolic CHF (congestive heart failure) ([place])   . CKD (chronic kidney disease), stage III   . Diabetes mellitus ([place])   . Hypertension   . Moderate aortic stenosis   . Neuromuscular disorder ([place])   . PAF (paroxysmal atrial fibrillation) ([place])   . Right rotator cuff tear   . Wears dentures    top denture-bottom partial  . Wears glasses   . Wears hearing aid    both ears   Past Surgical History:  Past Surgical History:  Procedure Laterality Date  . APPENDECTOMY  1945  . CATARACT EXTRACTION, BILATERAL  2008  . cervical stynosis  2005  . esophageal stretch  2001  . [place]  . herniated disc  1983  . left hand surgery  2011   gout  . left knee replacement  2002  . left knee surgery  1998  . right wrist fracture  2006  . SHOULDER ARTHROSCOPY WITH ROTATOR CUFF REPAIR Right 01/05/2013   Procedure: SHOULDER ARTHROSCOPY WITH ROTATOR CUFF REPAIR subchromial decompression and distal clavical excision;  Surgeon: Lorn Junes, MD;  Location: [facility];  Service: Orthopedics;  Laterality: Right;  . tonsillectomy  1936  . TONSILLECTOMY    . TYMPANOSTOMY TUBE PLACEMENT  1989  . VESICOVAGINAL FISTULA CLOSURE W/ TAH  1968   HPI:  OTTILIE WIGGLESWORTH is a 86 y.o. female with medical history significant for essential HTN, type 2 diabetes, chronic diastolic CHF, asthma, chronic hypoxia on 2L continuously who presented to [facility] ED from home due to sudden onset confusion and increase oxygen requirement.  She was in her usual state of health prior to this.  Woke up this morning  and was fixing breakfast with her son when suddenly she was noted to be confused.  Her oxygen level was 83% on 2 L, no hypoglycemia when checked at home.  EMS was called and patient was brought into the ED for further evaluation and management.  She has had occasional choking episodes in the past when she eats meats. MD reprots RML Community-acquired pneumonia, suspect superimposed by possible aspiration. Pt was seen in March of 2021 She exhibited symptoms of oropharyngeal dysphagia characterized by prolonged mastication, and inconsistent, delayed throat clearing with consecutive swallows of thin liquid and regular texture solids. A dysphagia 2 diet with thin liquids was recommended at that time but was eventally upgraded back to regular due to dislike of texture.    Assessment / Plan / Recommendation Clinical Impression  Pt demonstrates no immediate signs of aspiration when drinking thin liquids. She does have impaired mastication as her mandible does not line up with her maxilla and she typically uses her tongue to mash and prepare soft foods. She is a picky eater, but typically chooses toast, cottage cheese, etc. Her daughter at bedside reports that her mother had a large, rish meal the night prior to admit, but had no overt choking episode. When she woke up in the am she was altered and had a finding of pna. She sleeps in a recliner, but does eat  large meals sometimes late at night. Overall, the greatest risk of aspiration in this pt is twofold: pt has a risk of overt aspiration of poorly masticated solids, which would result in an obvious choking event, or pt may be at risk of night time reflux. In this case, pt was not observed to choke. Educated pts daughter of reflux precautions that may reduce risk. Will resume pts diet without restrictions though pts daughter will order appropriate foods of choice so as not to limit the pt. No f/u needed at this time as all education is complete.  SLP Visit Diagnosis:  Dysphagia, oral phase (R13.11);Dysphagia, unspecified (R13.10)    Aspiration Risk  Moderate aspiration risk    Diet Recommendation Regular;Thin liquid   Liquid Administration via: Cup;Straw Medication Administration: Whole meds with liquid Supervision: Staff to assist with self feeding;Full supervision/cueing for compensatory strategies Compensations: Slow rate;Small sips/bites Postural Changes: Seated upright at 90 degrees;Remain upright for at least 30 minutes after po intake    Other  Recommendations Oral Care Recommendations: Oral care BID   Follow up Recommendations        Frequency and Duration            Prognosis        Swallow Study   General HPI: [name] is a 86 y.o. female with medical history significant for essential HTN, type 2 diabetes, chronic diastolic CHF, asthma, chronic hypoxia on 2L continuously who presented to [facility] ED from home due to sudden onset confusion and increase oxygen requirement.  She was in her usual state of health prior to this.  Woke up this morning and was fixing breakfast with her son when suddenly she was noted to be confused.  Her oxygen level was 83% on 2 L, no hypoglycemia when checked at home.  EMS was called and patient was brought into the ED for further evaluation and management.  She has had occasional choking episodes in the past when she eats meats. MD reprots RML Community-acquired pneumonia, suspect superimposed by possible aspiration. Pt was seen in March of 2021 She exhibited symptoms of oropharyngeal dysphagia characterized by prolonged mastication, and inconsistent, delayed throat clearing with consecutive swallows of thin liquid and regular texture solids. A dysphagia 2 diet with thin liquids was recommended at that time but was eventally upgraded back to regular due to dislike of texture.  Type of Study: Bedside Swallow Evaluation Diet Prior to this Study: NPO Temperature Spikes Noted: No Respiratory Status: Nasal  cannula History of Recent Intubation: No Behavior/Cognition: Alert;Cooperative;Pleasant mood;Requires cueing Oral Cavity Assessment: Within Functional Limits Oral Care Completed by SLP: No Oral Cavity - Dentition: Poor condition Vision: Impaired for self-feeding Self-Feeding Abilities: Needs assist Patient Positioning: Upright in bed Baseline Vocal Quality: Hoarse Volitional Cough: Strong Volitional Swallow: Able to elicit    Oral/Motor/Sensory Function Overall Oral Motor/Sensory Function: Within functional limits   Ice Chips Ice chips: Not tested   Thin Liquid Thin Liquid: Within functional limits    Nectar Thick Nectar Thick Liquid: Not tested   Honey Thick Honey Thick Liquid: Not tested   Puree Puree: Within functional limits Presentation: Spoon   Solid     Solid: Impaired Presentation: Self Fed Oral Phase Impairments: Impaired mastication Oral Phase Functional Implications: Oral residue;Impaired mastication     Herbie Baltimore, MA CCC-SLP  Acute Rehabilitation Services Pager [phone] Office [phone]  Lynann Beaver 06/24/2020,12:31 PM

## 2020-06-24 NOTE — Progress Notes (Signed)
   06/24/20 1655  Assess: MEWS Score  Temp (!) 102.3 F (39.1 C)  BP (!) 158/52  Pulse Rate 75  ECG Heart Rate 75  Resp (!) 24  SpO2 94 %  O2 Device Nasal Cannula  O2 Flow Rate (L/min) 3 L/min  Assess: MEWS Score  MEWS Temp 2  MEWS Systolic 0  MEWS Pulse 0  MEWS RR 1  MEWS LOC 0  MEWS Score 3  MEWS Score Color Yellow  Assess: if the MEWS score is Yellow or Red  Were vital signs taken at a resting state? Yes  Focused Assessment Change from prior assessment (see assessment flowsheet)  Early Detection of Sepsis Score *See Row Information* High  MEWS guidelines implemented *See Row Information* Yes  Treat  Pain Scale 0-10  Pain Score 0  Take Vital Signs  Increase Vital Sign Frequency  Yellow: Q 2hr X 2 then Q 4hr X 2, if remains yellow, continue Q 4hrs  Escalate  MEWS: Escalate Yellow: discuss with charge nurse/RN and consider discussing with provider and RRT  Notify: Charge Nurse/RN  Name of Charge Nurse/RN Notified Bardolph, RN  Date Charge Nurse/RN Notified 06/24/20  Time Charge Nurse/RN Notified 1655  Notify: Provider  Provider Name/Title Domenic Polite, MD  Date Provider Notified 06/24/20  Time Provider Notified 1700  Notification Type Page  Notification Reason Change in status  Response See new orders  Date of Provider Response 06/24/20  Time of Provider Response 1730   Transfusion of blood held due to hematoma at site of infusion.  This was discontinued.  It was noted that patient had increasing confusion, delirium, was hallucinating, and delusion.  Not able to sustain a topic within a conversation.  Having flight of ideas.  Daughter in attendance at this time.  IV team in to re-start IV.  MD also in to assess patient.  See NO.  Blood will be re-started after temperature has decreased with use of Tylenol and environmental measures.  Blood bank notified.

## 2020-06-24 NOTE — Progress Notes (Addendum)
PROGRESS NOTE    Ann Sellers  ZOX:[id number] DOB: 1925/07/08 DOA: 07/08/2020 PCP: Reynold Bowen, MD  Brief Narrative: : Ann Sellers is a 86/F w/ COPD, chronic respiratory failure on 2 L home O2, chronic diastolic CHF, type 2 diabetes mellitus, essential hypertensio, moderate aortic stenosis, chronic kidney disease stage 4 was brought to the emergency room with sudden onset confusion and increase oxygen requirement.  She was in her usual state of health prior to this.  Woke up this morning and was fixing breakfast with her son when suddenly she was noted to be confused.  Her oxygen level was 83% on 2 L. EMS was called and patient was brought into the ED for further evaluation and management.  She has had occasional choking episodes in the past when she eats meats.  In the ED patient was somnolent but easily arousable to voices and confused.  ABG showed no evidence of hypercarbia with pH of 7.4.  UA negative.  CT head ordered and pending.  Chest x-ray completed with bilateral pulmonary infiltrates R>L and noted bilateral pleural effusion, also noted to have acute kidney injury with creatinine of 4.4 and severe hyperkalemia   Assessment & Plan:   Suspected aspiration pneumonia -Chest x-ray notes bilateral pneumonia, history suggestive of dysphagia, COVID-19 PCR is negative -Continue cefepime and Flagyl, discontinue vancomycin -Follow-up SLP evaluation -Continue duo nebs -Wean O2 down to 2 L  Acute metabolic encephalopathy -Likely in the setting of pneumonia, renal failure and gabapentin with decreased clearance -Improving, monitor, also very hard of hearing  Acute kidney injury on chronic kidney disease stage 4 Hyperkalemia -Baseline creatinine around 2.2 -Creatinine on admission was 4.4 with potassium of 6.5 -Renal ultrasound without hydronephrosis, etiology not very clear at this time, BUN is significantly elevated raising suspicion for prerenal azotemia -Hold Lasix, continue IV  fluids today -No urine output charted in past 24 hours, patient unsure if she urinated or not, will place Foley for the first 24-48 hrs.  Severe anemia -Etiology unclear, patient denies any overt bleeding, no history of melena or hematochezia -Could be worsened by hemodilution from fluid resuscitation yesterday, also with macrocytosis -Check anemia panel, infuse 1 unit of PRBC today -Not a good candidate for extensive work-up in the setting of 86 years old and above comorbidities  COPD/chronic respiratory failure -Stable, nebs as needed -Add incentive spirometry and antibiotics for aspiration pneumonia as noted above  Chronic diastolic CHF -Diuretics on hold in setting of renal failure, continue IV fluids today  Type 2 diabetes mellitus -Oral hypoglycemics on hold, continue sliding scale insulin    DVT prophylaxis: Hold heparin subcutaneous today due to severe anemia Code Status: DNR Family Communication: No family at bedside will update daughter Disposition Plan:  Status is: Inpatient  Remains inpatient appropriate because:Inpatient level of care appropriate due to severity of illness   Dispo:  Patient From: Home  Planned Disposition: Home with Health Care Svc  Expected discharge date: in 2-3days if stabilizes  Medically stable for discharge: No   Consultants:     Procedures:   Antimicrobials:    Subjective: -Pleasantly confused, hard of hearing, wondering where she is today  Objective: Vitals:   06/24/20 0137 06/24/20 0550 06/24/20 0913 06/24/20 0945  BP: (!) 120/45 (!) 131/51 (!) 110/52   Pulse: 70 73 90   Resp: 18 18 20    Temp: 98.5 F (36.9 C) 98.8 F (37.1 C)    TempSrc:  Oral    SpO2: 97% 97%  97%  Weight:  98.9 kg       Intake/Output Summary (Last 24 hours) at 06/24/2020 1113 Last data filed at 06/24/2020 0600 Gross per 24 hour  Intake 547.75 ml  Output 0 ml  Net 547.75 ml   Filed Weights   07/16/2020 1615 06/24/20 0137  Weight: 73.9 kg 98.9 kg     Examination:  General exam: Elderly pleasant female, very hard of hearing, resting comfortably in bed, no distress Respiratory system: Bilateral scattered rhonchi Cardiovascular system: S1 & S2 heard, RRR.  Gastrointestinal system: Abdomen is nondistended, soft and nontender.Normal bowel sounds heard. Central nervous system: Awake and alert, oriented to self only, moves all extremities no localizing signs Extremities: No edema Psychiatry: Poor insight and judgment    Data Reviewed:   CBC: Recent Labs  Lab 06/24/2020 1411 06/29/2020 1537 06/24/20 0609  WBC 9.6  --  6.4  NEUTROABS 8.4*  --  5.5  HGB 9.0* 10.2* 6.8*  HCT 32.1* 30.0* 22.9*  MCV 108.4*  --  102.2*  PLT 150  --  970*   Basic Metabolic Panel: Recent Labs  Lab 07/11/2020 1411 06/22/2020 1537 06/24/20 0609  NA 138 135 139  K 6.5* 6.0* 4.6  CL 103  --  102  CO2 23  --  25  GLUCOSE 159*  --  197*  BUN 90*  --  94*  CREATININE 4.41*  --  4.86*  CALCIUM 10.1  --  9.4   GFR: Estimated Creatinine Clearance: 7.6 mL/min (A) (by C-G formula based on SCr of 4.86 mg/dL (H)). Liver Function Tests: Recent Labs  Lab 07/18/2020 1411  AST 14*  ALT 8  ALKPHOS 62  BILITOT 0.5  PROT 6.2*  ALBUMIN 3.6   No results for input(s): LIPASE, AMYLASE in the last 168 hours. No results for input(s): AMMONIA in the last 168 hours. Coagulation Profile: Recent Labs  Lab 06/24/20 0609  INR 1.1   Cardiac Enzymes: No results for input(s): CKTOTAL, CKMB, CKMBINDEX, TROPONINI in the last 168 hours. BNP (last 3 results) Recent Labs    11/17/19 0918 11/23/19 1251 12/15/19 1117  PROBNP 1,533* 3,200* 2,498*   HbA1C: Recent Labs    06/24/20 0609  HGBA1C 5.7*   CBG: Recent Labs  Lab 06/27/2020 2149 06/24/20 0105 06/24/20 0438 06/24/20 0753  GLUCAP 240* 193* 186* 145*   Lipid Profile: No results for input(s): CHOL, HDL, LDLCALC, TRIG, CHOLHDL, LDLDIRECT in the last 72 hours. Thyroid Function Tests: No results for  input(s): TSH, T4TOTAL, FREET4, T3FREE, THYROIDAB in the last 72 hours. Anemia Panel: No results for input(s): VITAMINB12, FOLATE, FERRITIN, TIBC, IRON, RETICCTPCT in the last 72 hours. Urine analysis:    Component Value Date/Time   COLORURINE YELLOW 06/29/2020 1411   APPEARANCEUR CLEAR 07/15/2020 1411   LABSPEC 1.015 06/27/2020 1411   PHURINE 5.0 07/15/2020 1411   GLUCOSEU NEGATIVE 07/20/2020 1411   HGBUR NEGATIVE 07/08/2020 1411   BILIRUBINUR NEGATIVE 06/22/2020 1411   KETONESUR NEGATIVE 07/04/2020 1411   PROTEINUR 100 (A) 07/13/2020 1411   UROBILINOGEN 0.2 02/01/2014 1732   NITRITE NEGATIVE 07/19/2020 1411   LEUKOCYTESUR NEGATIVE 07/15/2020 1411   Sepsis Labs: @LABRCNTIP (procalcitonin:4,lacticidven:4)  ) Recent Results (from the past 240 hour(s))  SARS Coronavirus 2 by RT PCR (hospital order, performed in [facility] hospital lab) Nasopharyngeal Nasopharyngeal Swab     Status: None   Collection Time: 06/27/2020  2:11 PM   Specimen: Nasopharyngeal Swab  Result Value Ref Range Status   SARS Coronavirus 2 NEGATIVE NEGATIVE Final  Comment: (NOTE) SARS-CoV-2 target nucleic acids are NOT DETECTED.  The SARS-CoV-2 RNA is generally detectable in upper and lower respiratory specimens during the acute phase of infection. The lowest concentration of SARS-CoV-2 viral copies this assay can detect is 250 copies / mL. A negative result does not preclude SARS-CoV-2 infection and should not be used as the sole basis for treatment or other patient management decisions.  A negative result may occur with improper specimen collection / handling, submission of specimen other than nasopharyngeal swab, presence of viral mutation(s) within the areas targeted by this assay, and inadequate number of viral copies (<250 copies / mL). A negative result must be combined with clinical observations, patient history, and epidemiological information.  Fact Sheet for Patients:     StrictlyIdeas.no  Fact Sheet for Healthcare Providers: BankingDealers.co.za  This test is not yet approved or  cleared by the [place] FDA and has been authorized for detection and/or diagnosis of SARS-CoV-2 by FDA under an Emergency Use Authorization (EUA).  This EUA will remain in effect (meaning this test can be used) for the duration of the COVID-19 declaration under Section 564(b)(1) of the Act, 21 U.S.C. section 360bbb-3(b)(1), unless the authorization is terminated or revoked sooner.  Performed at [facility], [place] [address] [place], [place] [address]          Radiology Studies: CT Head Wo Contrast  Result Date: 07/16/2020 CLINICAL DATA:  Delirium EXAM: CT HEAD WITHOUT CONTRAST TECHNIQUE: Contiguous axial images were obtained from the base of the skull through the vertex without intravenous contrast. COMPARISON:  CT brain 03/30/2020 FINDINGS: Brain: No acute territorial infarction or hemorrhage is visualized. Stable 2.8 x 2.7 cm calcified extra-axial skull base mass consistent with planum sphenoidale meningioma. Mild atrophy. Mild hypodensity in the white matter consistent with chronic small vessel ischemic change. Stable ventricle size. Vascular: No hyperdense vessel. Vertebral and carotid vascular calcification Skull: Normal. Negative for fracture or focal lesion. Sinuses/Orbits: No acute finding. Other: None IMPRESSION: 1. No CT evidence for acute intracranial abnormality. 2. Atrophy and mild chronic small vessel ischemic changes of the white matter. 3. 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 Electronically Signed   By: Donavan Foil M.D.   On: 07/20/2020 19:23   CT CHEST WO CONTRAST  Result Date: 07/09/2020 CLINICAL DATA:  86 year old with respiratory failure. EXAM: CT CHEST WITHOUT CONTRAST TECHNIQUE: Multidetector CT imaging of the chest was performed following the standard protocol without IV contrast.  COMPARISON:  Radiograph earlier this day. FINDINGS: Cardiovascular: Mild multi chamber cardiomegaly. There are coronary artery calcifications. Mitral annulus calcifications. Atherosclerosis of the thoracic aorta with mild tortuosity. No aortic aneurysm. No pericardial effusion. Mediastinum/Nodes: No enlarged mediastinal lymph nodes. Limited assessment for hilar adenopathy in the absence of IV contrast as well as patient motion. Small hiatal hernia. There is no esophageal wall thickening. Hypodense 18 mm right thyroid nodule, stable from prior exam. In the setting of significant comorbidities or limited life expectancy, no follow-up recommended (ref: J Am Coll Radiol. 2015 Feb;12(2): 143-50). Lungs/Pleura: Multifocal nodular airspace disease throughout both lungs, some of which appears ground-glass. Confluent opacity in the dependent right lower lobe with air bronchograms. There are small bilateral pleural effusions. Associated dependent opacity at the left lung base is likely compressive atelectasis. Occasional smooth scattered septal thickening. No debris in the trachea or bronchial tree. Upper Abdomen: High-density ingested material in the stomach. No other acute finding. Musculoskeletal: Degenerative change in the spine. There are no acute or suspicious osseous abnormalities.  IMPRESSION: 1. Multifocal nodular airspace disease throughout both lungs, some of which appears ground-glass. Confluent opacity in the dependent right lower lobe with air bronchograms. Findings most consistent with multifocal pneumonia. Given the occasional nodular appearance, recommend radiographic follow-up to ensure resolution. 2. Small bilateral pleural effusions. Left basilar opacity is more typical of compressive atelectasis. 3. Mild cardiomegaly. Coronary artery calcifications. 4. Mild smooth septal thickening which can be seen with mild pulmonary edema. Aortic Atherosclerosis (ICD10-I70.0). Electronically Signed   By: Keith Rake M.D.   On: 07/14/2020 19:52   US RENAL  Result Date: 06/21/2020 CLINICAL DATA:  Acute kidney injury EXAM: RENAL / URINARY TRACT ULTRASOUND COMPLETE COMPARISON:  12/26/2019 FINDINGS: Right Kidney: Renal measurements: 9.3 x 5.1 x 5.4 cm = volume: 135 mL. Normal echotexture. No hydronephrosis. 4 cm cyst in the lower pole. 1.5 cm hypoechoic area within the upper pole, similar to prior study compatible with complex cyst. Left Kidney: Renal measurements: 9.9 x 5.1 x 5.7 cm = volume: 151 mL. Echogenicity within normal limits. No mass or hydronephrosis visualized. Bladder: Appears normal for degree of bladder distention. Other: None. IMPRESSION: No acute findings.  No hydronephrosis. Right renal cysts are unchanged. Electronically Signed   By: Rolm Baptise M.D.   On: 06/26/2020 20:32   DG Chest Port 1 View  Result Date: 06/24/2020 CLINICAL DATA:  Sepsis, dyspnea, generalized weakness EXAM: PORTABLE CHEST 1 VIEW COMPARISON:  01/07/2020 FINDINGS: The lungs are symmetrically well expanded. Bibasilar airspace infiltrates are present, likely infectious or inflammatory in nature. Small left pleural effusion is present. No pneumothorax. Cardiac size is within normal limits, unchanged. Pulmonary vascularity is normal. No acute bone abnormality. IMPRESSION: Bibasilar airspace infiltrates, likely infectious or inflammatory in nature. Small left pleural possible parapneumonic effusion. Electronically Signed   By: Fidela Salisbury MD   On: 07/15/2020 15:57        Scheduled Meds: . sodium chloride   Intravenous Once  . aspirin  81 mg Oral Daily  . calcium acetate  667 mg Oral TID WC  . fluticasone  2 spray Each Nare Daily  . insulin aspart  0-5 Units Subcutaneous QHS  . insulin aspart  0-9 Units Subcutaneous TID WC  . ipratropium-albuterol  3 mL Nebulization Q6H  . mouth rinse  15 mL Mouth Rinse BID  . metoprolol succinate  12.5 mg Oral Daily  . mometasone-formoterol  2 puff Inhalation BID  . montelukast   10 mg Oral QHS  . pantoprazole  40 mg Oral Daily  . pentoxifylline  400 mg Oral TID WC   Continuous Infusions: . sodium chloride 75 mL/hr at 06/24/20 0909  . ceFEPime (MAXIPIME) IV    . metronidazole 500 mg (06/24/20 1031)     LOS: 1 day    Time spent: 14min Domenic Polite, MD [facility]  06/24/2020, 11:13 AM

## 2020-06-25 ENCOUNTER — Inpatient Hospital Stay (HOSPITAL_COMMUNITY): Payer: Medicare Other

## 2020-06-25 DIAGNOSIS — Z20822 Contact with and (suspected) exposure to covid-19: Secondary | ICD-10-CM | POA: Diagnosis not present

## 2020-06-25 DIAGNOSIS — Z66 Do not resuscitate: Secondary | ICD-10-CM | POA: Diagnosis not present

## 2020-06-25 DIAGNOSIS — J690 Pneumonitis due to inhalation of food and vomit: Secondary | ICD-10-CM | POA: Diagnosis not present

## 2020-06-25 DIAGNOSIS — G9341 Metabolic encephalopathy: Secondary | ICD-10-CM | POA: Diagnosis not present

## 2020-06-25 LAB — BPAM RBC
Blood Product Expiration Date: 202110072359
Blood Product Expiration Date: 202110072359
ISSUE DATE / TIME: 202109041517
ISSUE DATE / TIME: 202109042355
Unit Type and Rh: 5100
Unit Type and Rh: 5100

## 2020-06-25 LAB — TYPE AND SCREEN
ABO/RH(D): O POS
Antibody Screen: NEGATIVE
Unit division: 0
Unit division: 0

## 2020-06-25 LAB — URINE CULTURE: Culture: >=100000 — AB

## 2020-06-25 LAB — CBC WITH DIFFERENTIAL/PLATELET
Abs Immature Granulocytes: 0.05 10*3/uL (ref 0.00–0.07)
Basophils Absolute: 0 10*3/uL (ref 0.0–0.1)
Basophils Relative: 0 %
Eosinophils Absolute: 0.1 10*3/uL (ref 0.0–0.5)
Eosinophils Relative: 2 %
HCT: 30 % — ABNORMAL LOW (ref 36.0–46.0)
Hemoglobin: 9.2 g/dL — ABNORMAL LOW (ref 12.0–15.0)
Immature Granulocytes: 1 %
Lymphocytes Relative: 4 %
Lymphs Abs: 0.4 10*3/uL — ABNORMAL LOW (ref 0.7–4.0)
MCH: 30.1 pg (ref 26.0–34.0)
MCHC: 30.7 g/dL (ref 30.0–36.0)
MCV: 98 fL (ref 80.0–100.0)
Monocytes Absolute: 0.5 10*3/uL (ref 0.1–1.0)
Monocytes Relative: 6 %
Neutro Abs: 7.6 10*3/uL (ref 1.7–7.7)
Neutrophils Relative %: 87 %
Platelets: 135 10*3/uL — ABNORMAL LOW (ref 150–400)
RBC: 3.06 MIL/uL — ABNORMAL LOW (ref 3.87–5.11)
RDW: 15.3 % (ref 11.5–15.5)
WBC: 8.6 10*3/uL (ref 4.0–10.5)
nRBC: 0.2 % (ref 0.0–0.2)

## 2020-06-25 LAB — GLUCOSE, CAPILLARY
Glucose-Capillary: 121 mg/dL — ABNORMAL HIGH (ref 70–99)
Glucose-Capillary: 124 mg/dL — ABNORMAL HIGH (ref 70–99)
Glucose-Capillary: 151 mg/dL — ABNORMAL HIGH (ref 70–99)
Glucose-Capillary: 185 mg/dL — ABNORMAL HIGH (ref 70–99)
Glucose-Capillary: 92 mg/dL (ref 70–99)

## 2020-06-25 LAB — BASIC METABOLIC PANEL
Anion gap: 11 (ref 5–15)
BUN: 86 mg/dL — ABNORMAL HIGH (ref 8–23)
CO2: 23 mmol/L (ref 22–32)
Calcium: 9.6 mg/dL (ref 8.9–10.3)
Chloride: 105 mmol/L (ref 98–111)
Creatinine, Ser: 3.97 mg/dL — ABNORMAL HIGH (ref 0.44–1.00)
GFR calc Af Amer: 11 mL/min — ABNORMAL LOW (ref >60)
GFR calc non Af Amer: 9 mL/min — ABNORMAL LOW (ref >60)
Glucose, Bld: 176 mg/dL — ABNORMAL HIGH (ref 70–99)
Potassium: 5.5 mmol/L — ABNORMAL HIGH (ref 3.5–5.1)
Sodium: 139 mmol/L (ref 135–145)

## 2020-06-25 LAB — LACTIC ACID, PLASMA
Lactic Acid, Venous: 1.1 mmol/L (ref 0.5–1.9)
Lactic Acid, Venous: 0.8 mmol/L (ref 0.5–1.9)

## 2020-06-25 IMAGING — CT CT HEAD W/O CM
4 series · 15 of 47 positions shown, 17 images · non-contrast
Comparison: Head CT 12/30/2019.  Brain MRI 09/13/2016.

CLINICAL DATA: [AGE] female status post fall with bleeding
head injury.

EXAM:
CT HEAD WITHOUT CONTRAST
TECHNIQUE: Contiguous axial images were obtained from the base of the skull
through the vertex without intravenous contrast.

[Series 3: head wo · axial · 0.43mm/px · z∈[+1107,+1227]mm · 7 of 34 slices shown, 9 images]
[im 5/34  brain]
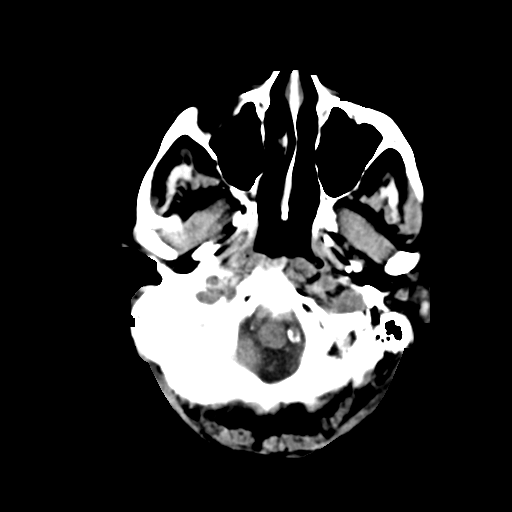
[im 5/34  bone]
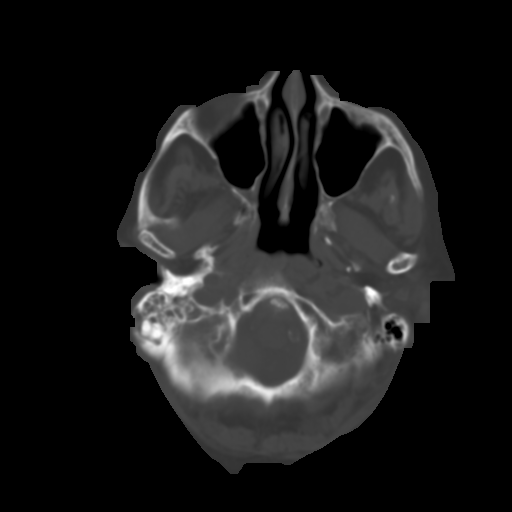
[im 9/34  brain]
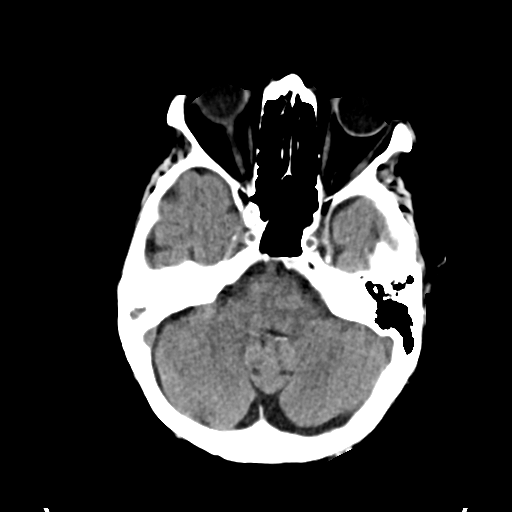
[im 13/34  brain]
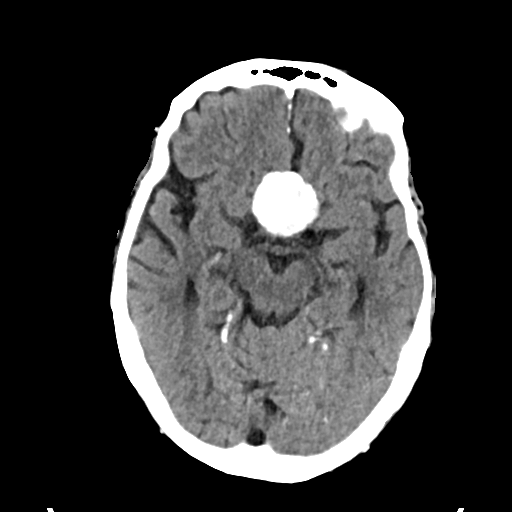
[im 17/34  brain]
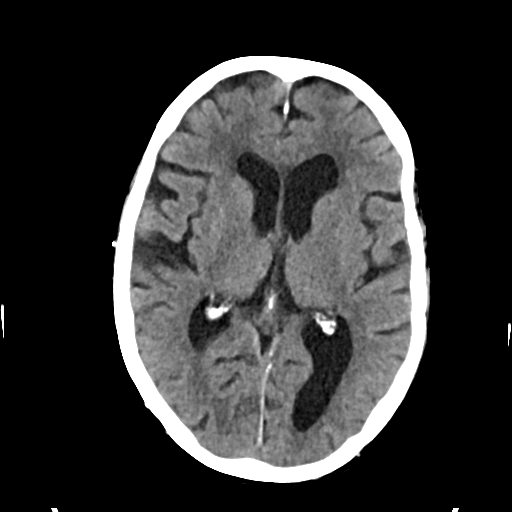
[im 21/34  brain]
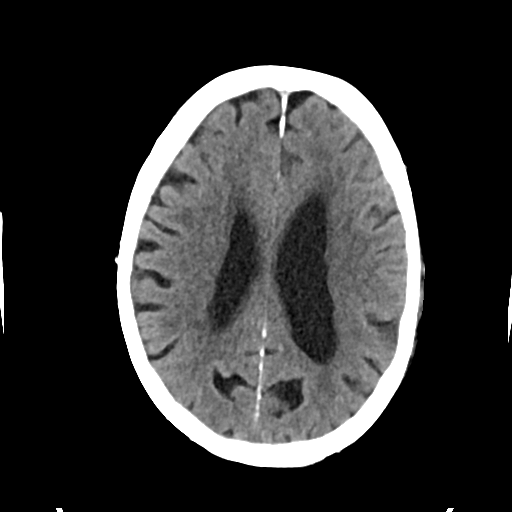
[im 21/34  bone]
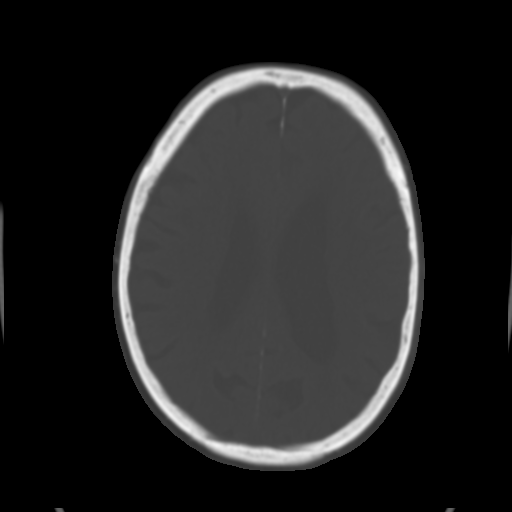
[im 25/34  brain]
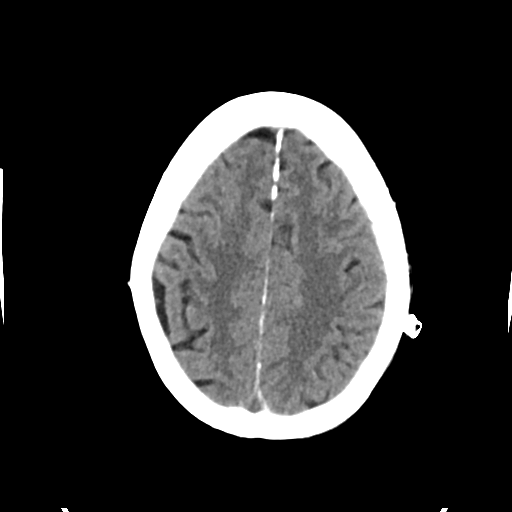
[im 29/34  brain]
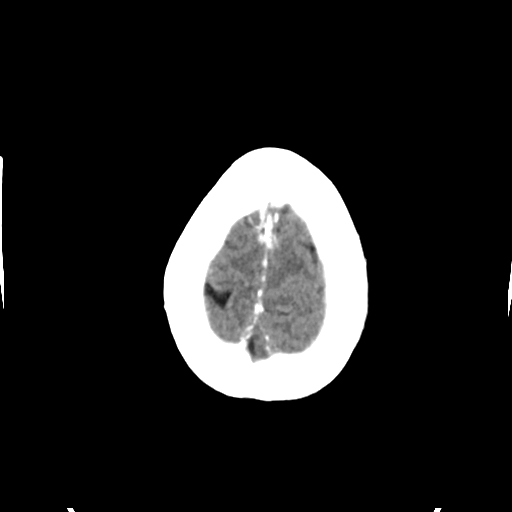

[Series 4: head bone · axial · 0.43mm/px · z∈[+1103,+1119]mm · 2 of 84 slices shown]
[im 9/84  bone]
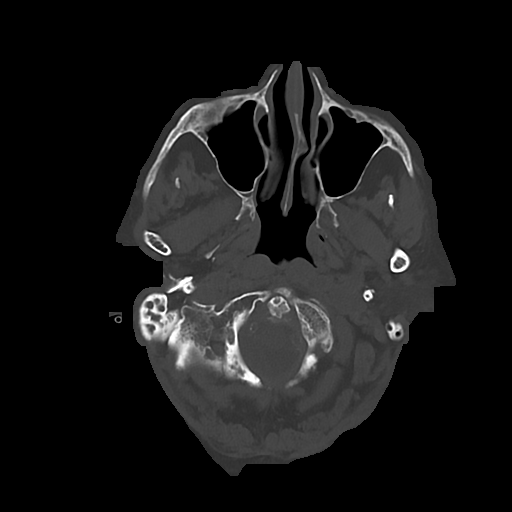
[im 17/84  bone]
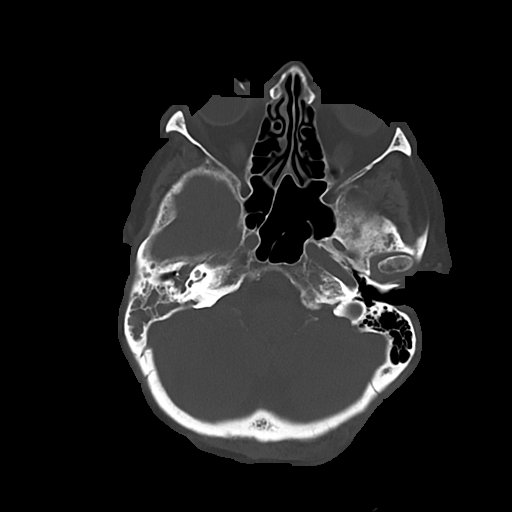

[Series 5: cor soft · coronal · 0.33mm/px · 3 of 71 slices shown]
[im 24/71  brain]
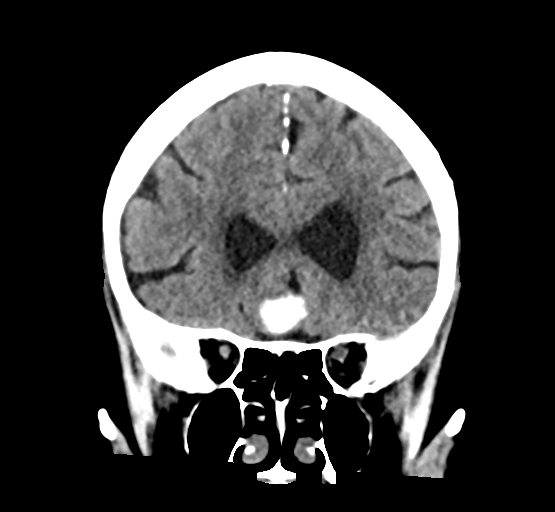
[im 32/71  brain]
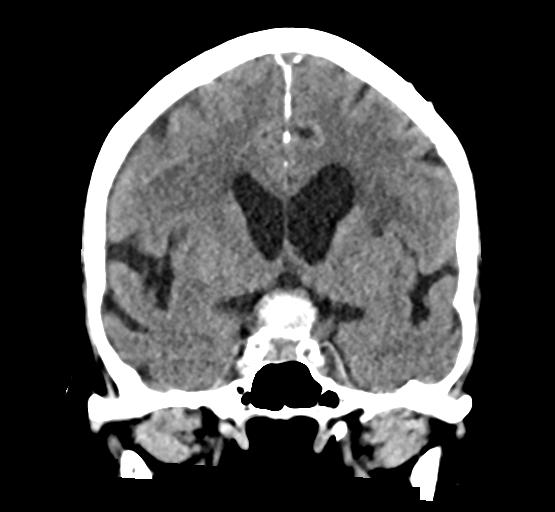
[im 39/71  brain]
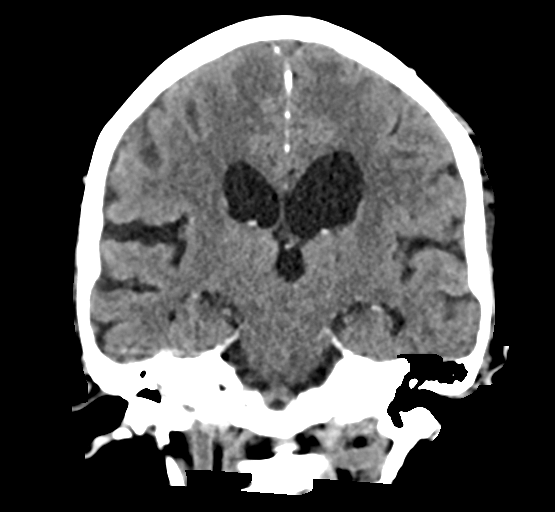

[Series 6: sag soft · sagittal · 0.33mm/px · 3 of 60 slices shown]
[im 20/60  brain]
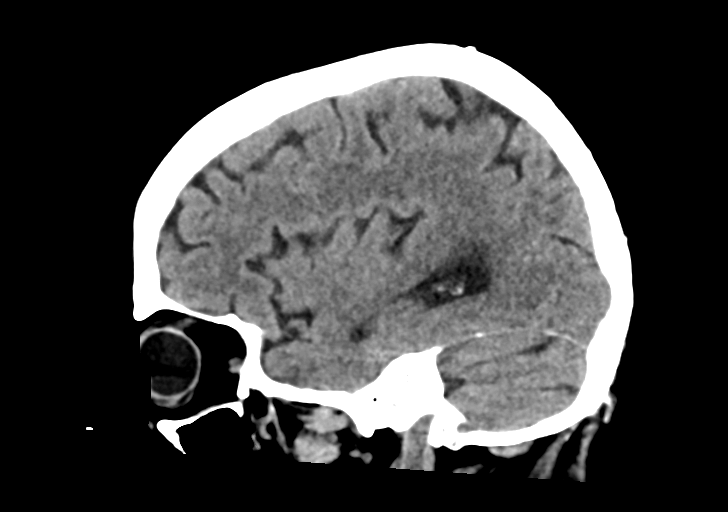
[im 30/60  brain]
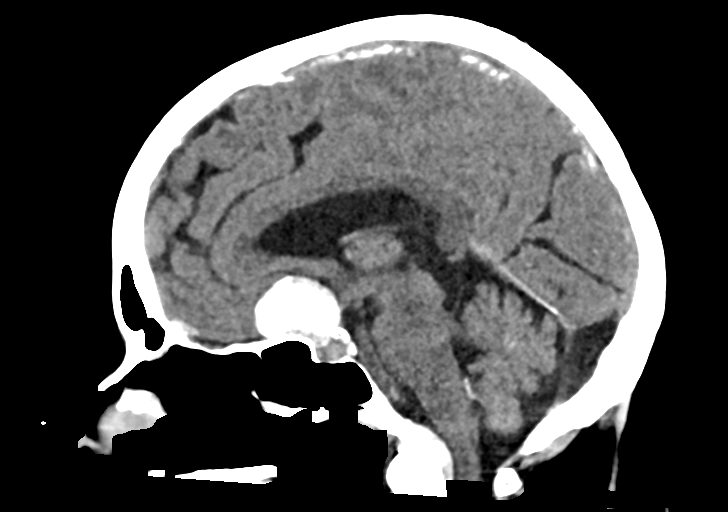
[im 40/60  brain]
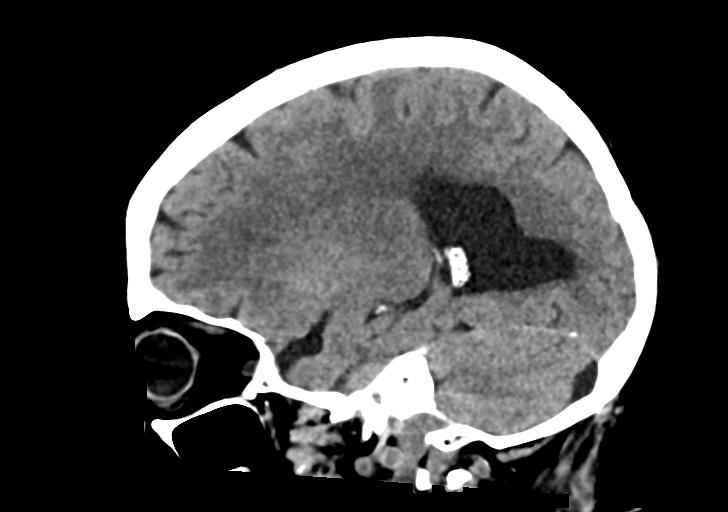

[15 of 47 positions shown; findings below may reference images not displayed]

FINDINGS: Brain: Chronic calcified 3 cm meningioma of the planum sphenoidale
(sagittal image 32). Mild regional mass effect as before with no
evidence of associated cerebral edema.

Stable cerebral volume and ventricular system. No midline shift.
Stable gray-white matter differentiation throughout the brain. Mild
for age white matter hypodensity. No cortically based acute infarct
identified. No acute intracranial hemorrhage identified.

Vascular: Calcified atherosclerosis at the skull base.

Skull: No right temporal bone or other skull fracture is identified.
Partially visible upper cervical spine degeneration.

Sinuses/Orbits: New opacification of the right middle ear and
mastoids. The right EAC appears clear. Left tympanic cavity and
mastoids remain clear. Paranasal sinuses are clear.

Other: Left scalp convexity skin staples in place. Mild underlying
scalp hematoma and trace soft tissue gas. Underlying calvarium
appears intact. Stable and negative orbits soft tissues. Calcified
scalp vessel atherosclerosis.
IMPRESSION: 1. Left scalp convexity scalp soft tissue injury without underlying
skull fracture.

2. Newly opacified right middle ear and mastoids since [REDACTED]. But no
temporal bone fracture is identified, so this might instead be
related to middle ear infection.

3. No acute intracranial abnormality. Stable chronic 3 cm planum
sphenoidale meningioma.

## 2020-06-25 IMAGING — CT CT CERVICAL SPINE W/O CM
3 of 4 series · 13 of 35 positions shown, 16 images · non-contrast
Comparison: Head CT today reported separately.

Cervical spine MRI 09/13/2016.

CLINICAL DATA: [AGE] female status post fall with bleeding
head injury.

EXAM:
CT CERVICAL SPINE WITHOUT CONTRAST
TECHNIQUE: Multidetector CT imaging of the cervical spine was performed without
intravenous contrast. Multiplanar CT image reconstructions were also
generated.

[Series 8: sag bone · sagittal · 0.40mm/px · 5 of 98 slices shown, 6 images]
[im 33/98  bone]
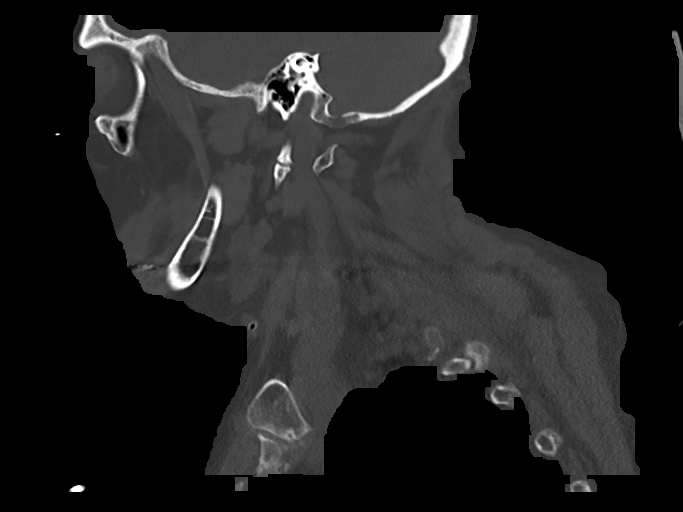
[im 41/98  bone]
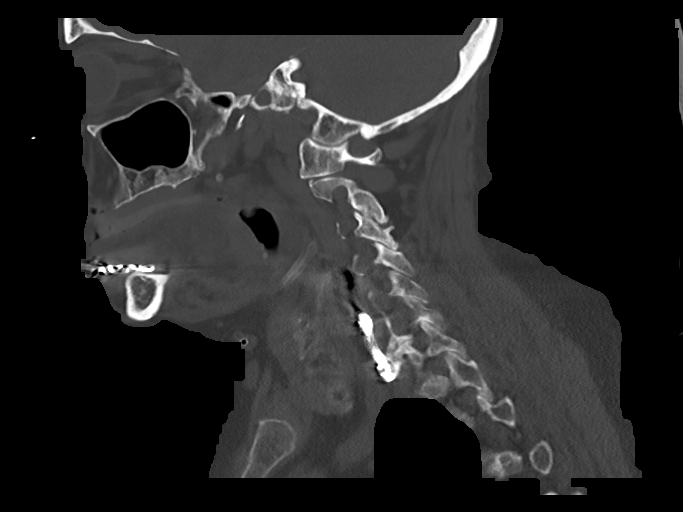
[im 49/98  soft-tissue]
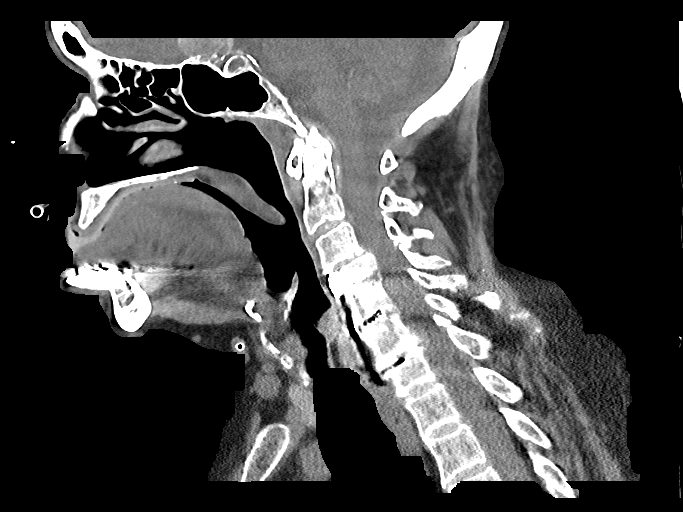
[im 49/98  bone]
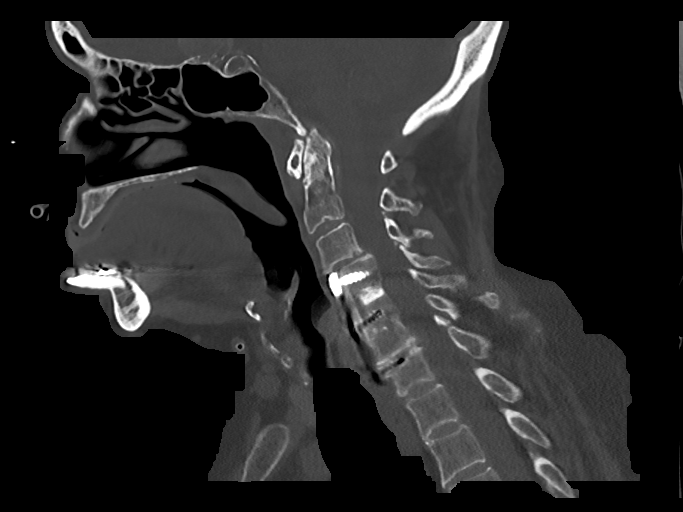
[im 57/98  bone]
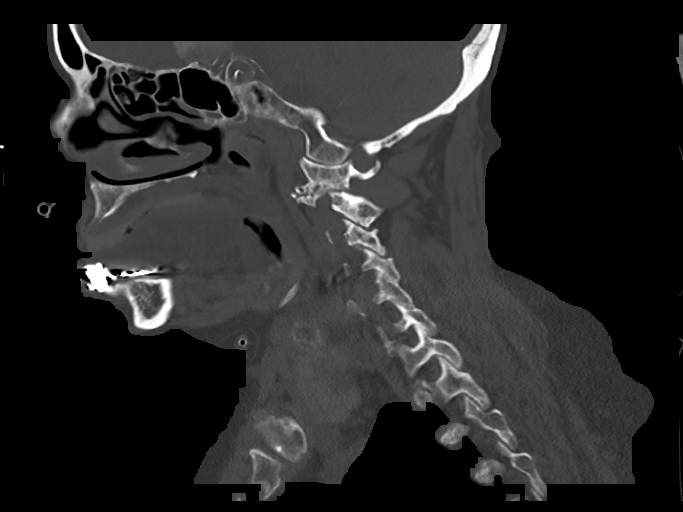
[im 65/98  bone]
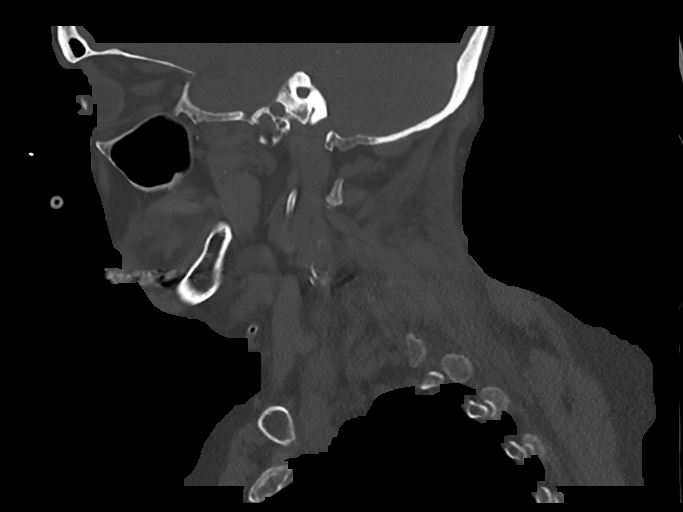

[Series 9: cor bone · coronal · 0.40mm/px · 3 of 134 slices shown]
[im 27/134  bone]
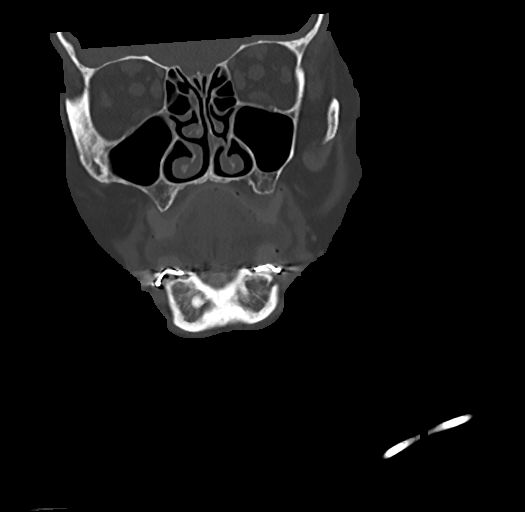
[im 54/134  bone]
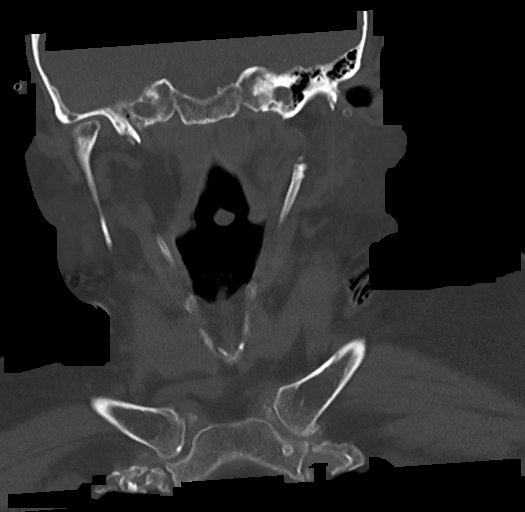
[im 80/134  bone]
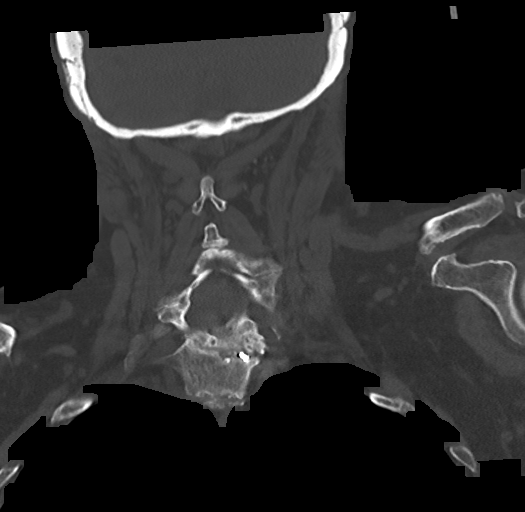

[Series 10: orthogonal axials · axial · 0.21mm/px · z∈[+982,+1087]mm · 5 of 85 slices shown, 7 images]
[im 15/85  soft-tissue]
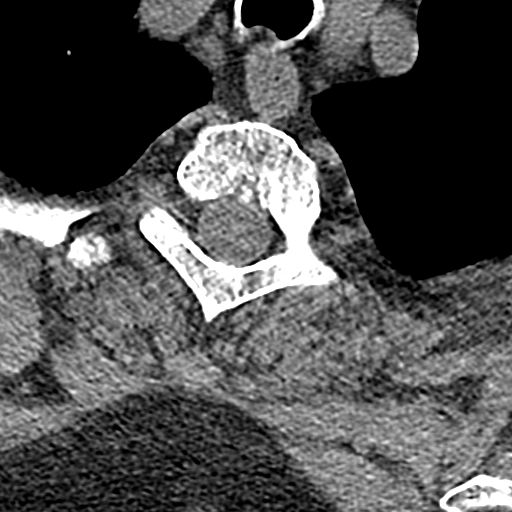
[im 15/85  bone]
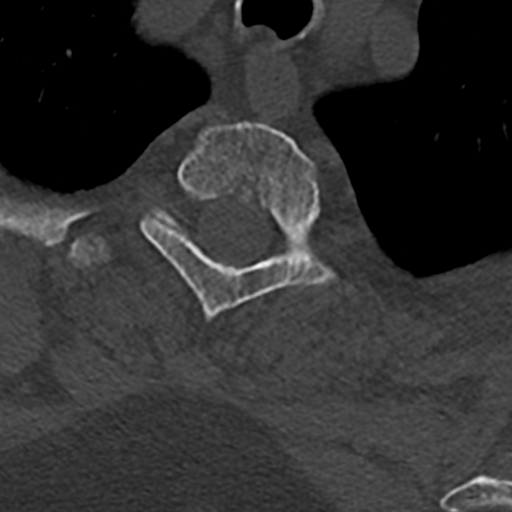
[im 29/85  bone]
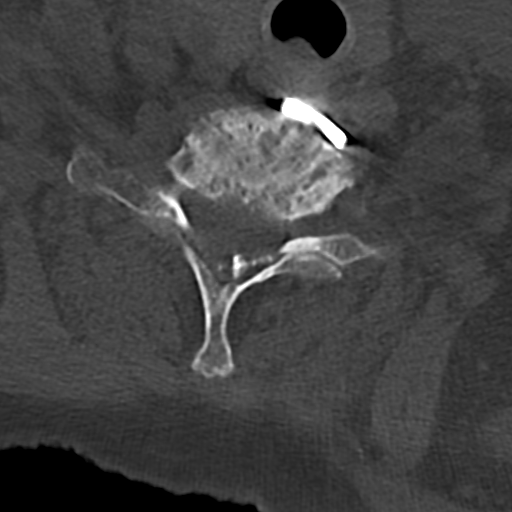
[im 43/85  bone]
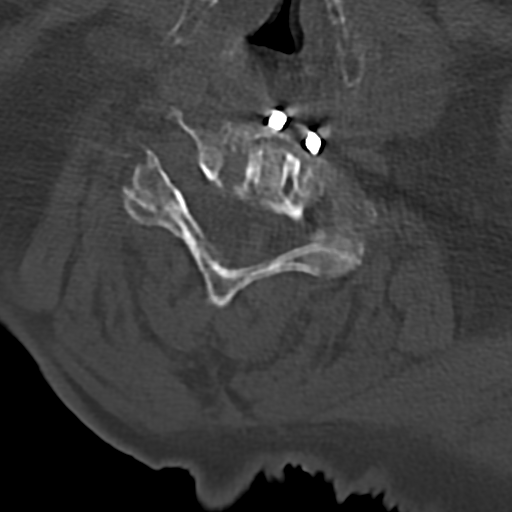
[im 57/85  bone]
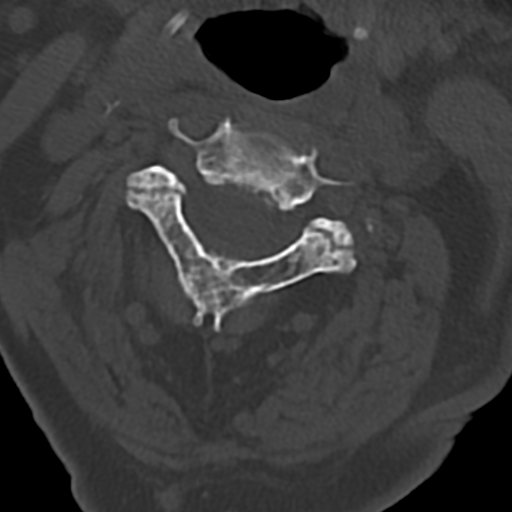
[im 71/85  soft-tissue]
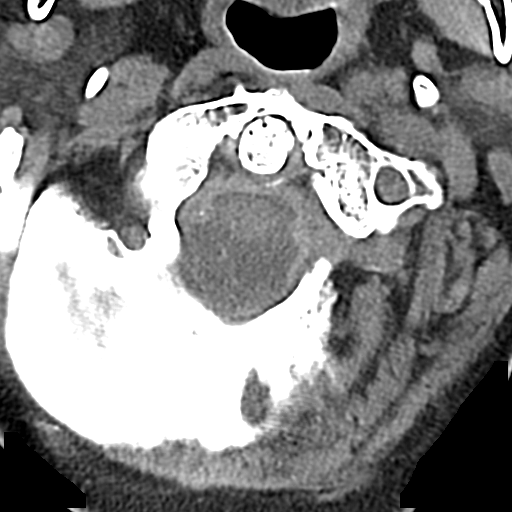
[im 71/85  bone]
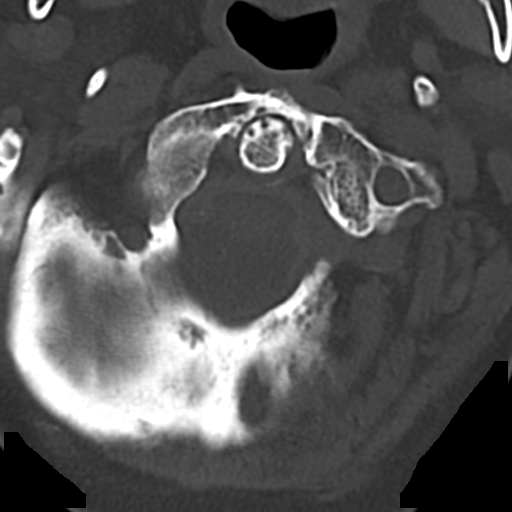

[13 of 35 positions shown; findings below may reference images not displayed]

FINDINGS: Alignment: Chronic straightening of cervical lordosis with
degenerative appearing anterolisthesis at C3-C4. Cervicothoracic
junction alignment is within normal limits. Bilateral posterior
element alignment is within normal limits.

Skull base and vertebrae: Right middle ear and mastoid opacification
again noted but as on the head CT today and no right temporal bone
fracture lucency is identified.

Visualized skull base is intact. No atlanto-occipital dissociation.
No acute osseous abnormality identified. Postoperative details are
below.

Soft tissues and spinal canal: No prevertebral fluid or swelling. No
visible canal hematoma. Negative noncontrast neck soft tissues, with
an insignificant right thyroid nodule given this patient age (ref: [HOSPITAL]. [DATE]): 143-50).

Disc levels: Very severe chronic C1-C2 degeneration worse on the
right side. Prior C4-C5 and C5-C6 ACDF with solid arthrodesis of
those levels. The lowest ACDF hardware may be within the superior C7
body, although there is no convincing C6-C7 arthrodesis. Upper
cervical facet arthropathy including related to C3-C4 mild
spondylolisthesis. These levels appear stable since the 3297 MRI.

Upper chest: Osteopenia. Grossly intact visible upper thoracic
levels. Small layering left pleural effusion (series 5, image 95).
Otherwise negative lung apices.

Other: Extensive calcified bilateral stylohyoid ligaments. Absent
maxillary dentition. Brain reported on head CT today separately.
IMPRESSION: 1. No acute traumatic injury identified in the cervical spine.
2. Prior C4-C5 and C5-C6 ACDF with solid arthrodesis of those
levels, advanced cervical spine degeneration elsewhere.
3. Small layering left pleural effusion.

## 2020-06-25 MED ORDER — SODIUM CHLORIDE 0.9 % IV SOLN: INTRAVENOUS | Status: AC

## 2020-06-25 MED ORDER — METRONIDAZOLE IN NACL 5-0.79 MG/ML-% IV SOLN
500.0000 mg | Freq: Three times a day (TID) | INTRAVENOUS | Status: DC
  Administered 2020-06-25 – 2020-06-26 (×3): 500 mg via INTRAVENOUS
  Filled 2020-06-25 (×4): qty 100

## 2020-06-25 MED ORDER — METOPROLOL TARTRATE 5 MG/5ML IV SOLN
5.0000 mg | Freq: Once | INTRAVENOUS | Status: AC
  Administered 2020-06-25: 5 mg via INTRAVENOUS
  Filled 2020-06-25: qty 5

## 2020-06-25 MED ORDER — SODIUM ZIRCONIUM CYCLOSILICATE 5 G PO PACK
5.0000 g | PACK | Freq: Two times a day (BID) | ORAL | Status: DC
  Administered 2020-06-25 (×2): 5 g via ORAL
  Filled 2020-06-25 (×3): qty 1

## 2020-06-25 MED ORDER — IPRATROPIUM-ALBUTEROL 0.5-2.5 (3) MG/3ML IN SOLN
3.0000 mL | Freq: Four times a day (QID) | RESPIRATORY_TRACT | Status: DC | PRN
  Administered 2020-06-28: 3 mL via RESPIRATORY_TRACT
  Filled 2020-06-25: qty 3

## 2020-06-25 MED ORDER — SODIUM CHLORIDE 0.9 % IV SOLN
510.0000 mg | Freq: Once | INTRAVENOUS | Status: AC
  Administered 2020-06-25: 510 mg via INTRAVENOUS
  Filled 2020-06-25: qty 17

## 2020-06-25 MED ORDER — HEPARIN SODIUM (PORCINE) 5000 UNIT/ML IJ SOLN
5000.0000 [IU] | Freq: Three times a day (TID) | INTRAMUSCULAR | Status: DC
  Administered 2020-06-25 – 2020-06-30 (×16): 5000 [IU] via SUBCUTANEOUS
  Filled 2020-06-25 (×15): qty 1

## 2020-06-25 NOTE — Evaluation (Signed)
Physical Therapy Evaluation Patient Details Name: Ann Sellers MRN: [id number] DOB: 12-23-1924 Today's Date: 06/25/2020   History of Present Illness  Ann Sellers is a 86 y.o. female with medical history significant for essential HTN, type 2 diabetes, chronic diastolic CHF, asthma, chronic hypoxia on 2L continuously who presented to [facility] ED from home due to sudden onset confusion and increase oxygen requirement.  Clinical Impression   Pt admitted with above diagnosis. Comes form home where she lives with her son, who can provide intermittent assist; at baseline, she walks household distances with her RW, and participates in light housework and cooking; is typically on supplemental O2 2L; Presents to PT with generalized weakness, decr functional independence, higher supplemental O2 needs;  Pt currently with functional limitations due to the deficits listed below (see PT Problem List). Pt will benefit from skilled PT to increase their independence and safety with mobility to allow discharge to the venue listed below.       Follow Up Recommendations SNF    Equipment Recommendations  None recommended by PT (Seems pretty well-equipped)    Recommendations for Other Services       Precautions / Restrictions Precautions Precautions: Fall Precaution Comments: monitor work of breathing      Mobility  Bed Mobility Overal bed mobility: Needs Assistance Bed Mobility: Supine to Sit     Supine to sit: Mod assist     General bed mobility comments: Good initiation reaching for bedrail; mod assist to help LEs move to the EOB, and to elevate trunk to sit; used bed pad to square off hips  Transfers Overall transfer level: Needs assistance Equipment used: Rolling walker (2 wheeled) (RW too tall -- bring youth-sized next session) Transfers: Sit to/from [facility] Sit to Stand: Mod assist Stand pivot transfers: Mod assist       General transfer comment: Very slow rise to  stand from high bed with heavy mod assist to power up; extra time as well to gain footing before taking 3-4 sidesteps bed to recliner  Ambulation/Gait                Stairs            Wheelchair Mobility    Modified Rankin (Stroke Patients Only)       Balance Overall balance assessment: Needs assistance Sitting-balance support: Feet supported Sitting balance-Leahy Scale: Fair       Standing balance-Leahy Scale: Poor                               Pertinent Vitals/Pain Pain Assessment: No/denies pain    Home Living Family/patient expects to be discharged to:: Private residence Living Arrangements: Children Available Help at Discharge: Family;Available PRN/intermittently;Personal care attendant Type of Home: House Home Access: Stairs to enter;Ramped entrance Entrance Stairs-Rails: Can reach both Entrance Stairs-Number of Steps: 3 Home Layout: One level Home Equipment: Grab bars - tub/shower;Hand held shower head;Other (comment);Adaptive equipment;Walker - 2 wheels      Prior Function Level of Independence: Independent with assistive device(s);Needs assistance   Gait / Transfers Assistance Needed: Household ambulator with RW; Able to get up at night and go to the bathroom unassisted  ADL's / Homemaking Assistance Needed: light cooking        Hand Dominance   Dominant Hand: Right    Extremity/Trunk Assessment   Upper Extremity Assessment Upper Extremity Assessment: Generalized weakness    Lower Extremity Assessment Lower Extremity  Assessment: Generalized weakness       Communication   Communication: HOH  Cognition Arousal/Alertness: Awake/alert Behavior During Therapy: WFL for tasks assessed/performed (had been quite restless the previous night) Overall Cognitive Status: Impaired/Different from baseline Area of Impairment: Orientation                 Orientation Level: Disoriented to;Place;Time;Situation ("Are we at  school?")             General Comments: Episodes of delirium and combativeness overnight      General Comments General comments (skin integrity, edema, etc.): Son [name] present for session and very helpful; Session conducted on 4 L supplemental O2, audible wheezing once in chair; O2 sats ranged 95-100%; RN notified    Exercises     Assessment/Plan    PT Assessment Patient needs continued PT services  PT Problem List Decreased strength;Decreased range of motion;Decreased activity tolerance;Decreased balance;Decreased mobility;Decreased coordination;Decreased cognition;Decreased knowledge of use of DME;Decreased safety awareness;Decreased knowledge of precautions;Cardiopulmonary status limiting activity       PT Treatment Interventions DME instruction;Gait training;Stair training;Functional mobility training;Therapeutic activities;Therapeutic exercise;Balance training;Neuromuscular re-education;Cognitive remediation;Patient/family education    PT Goals (Current goals can be found in the Care Plan section)  Acute Rehab PT Goals Patient Stated Goal: did not state PT Goal Formulation: With patient Time For Goal Achievement: 07/09/20 Potential to Achieve Goals: Fair    Frequency Min 3X/week   Barriers to discharge        Co-evaluation               AM-PAC PT "6 Clicks" Mobility  Outcome Measure Help needed turning from your back to your side while in a flat bed without using bedrails?: A Little Help needed moving from lying on your back to sitting on the side of a flat bed without using bedrails?: A Lot Help needed moving to and from a bed to a chair (including a wheelchair)?: A Lot Help needed standing up from a chair using your arms (e.g., wheelchair or bedside chair)?: A Lot Help needed to walk in hospital room?: A Lot Help needed climbing 3-5 steps with a railing? : Total 6 Click Score: 12    End of Session Equipment Utilized During Treatment: Gait  belt Activity Tolerance: Patient tolerated treatment well Patient left: in chair;with call bell/phone within reach;with chair alarm set;with family/visitor present Nurse Communication: Mobility status;Need for lift equipment (Consider Stedy for back to bed) PT Visit Diagnosis: Muscle weakness (generalized) (M62.81);Other abnormalities of gait and mobility (R26.89)    Time: 1015-1100 PT Time Calculation (min) (ACUTE ONLY): 45 min   Charges:   PT Evaluation $PT Eval Moderate Complexity: 1 Mod PT Treatments $Therapeutic Activity: 23-37 mins        Roney Marion, PT  Acute Rehabilitation Services Pager [phone] Office [phone]   Colletta [place] 06/25/2020, 11:29 AM

## 2020-06-25 NOTE — Progress Notes (Signed)
Spoke with Stanford Scotland and asked if she or another RN could attempt an IV start on the patient. If unsuccessful please replace IV team consult and we would come and assess

## 2020-06-25 NOTE — Progress Notes (Signed)
   06/25/20 0351  Assess: MEWS Score  Temp 98.4 F (36.9 C)  BP (!) 150/79  Pulse Rate 98  Resp (!) 32  SpO2 92 %  O2 Device Nasal Cannula  Patient Activity (if Appropriate) In bed  O2 Flow Rate (L/min) 4 L/min  Assess: MEWS Score  MEWS Temp 0  MEWS Systolic 0  MEWS Pulse 0  MEWS RR 2  MEWS LOC 0  MEWS Score 2  MEWS Score Color Yellow  Assess: if the MEWS score is Yellow or Red  Were vital signs taken at a resting state? Yes  Focused Assessment Change from prior assessment (see assessment flowsheet) (Patient went into AFIB with RVR - HR as high as 160s)  Early Detection of Sepsis Score *See Row Information* High  MEWS guidelines implemented *See Row Information* Yes  Treat  MEWS Interventions Administered scheduled meds/treatments (IV Metoprolol 5 mg administered per MD order)  Pain Scale 0-10  Take Vital Signs  Increase Vital Sign Frequency  Yellow: Q 2hr X 2 then Q 4hr X 2, if remains yellow, continue Q 4hrs  Escalate  MEWS: Escalate Yellow: discuss with charge nurse/RN and consider discussing with provider and RRT  Notify: Charge Nurse/RN  Name of Charge Nurse/RN Notified Christie CN  Date Charge Nurse/RN Notified 06/25/20  Time Charge Nurse/RN Notified 0355   Patient remained agitated and restless throughout the night.  At approximately, 0345, this RN was notified by CCM that patient's HR is in the 160s sustained.  Upon entry into the room, patient is lying sideways in the bed with her head hanging off the bed.  Patient repositioned.  She is sweating with increased work of breathing (holding O2 sats at >90 on 4 LPM) and abdomen appears more distended.  Tele monitor is showing HR in 160s.  EKG obtained and showed AFIB with RVR.  Difficult to obtain accurate BP with patient tensing arm and jerking it away.  [facility] made aware.  Order for IV Metoprolol obtained and given.  Per CCM, patient did convert back to NSR in 80s.  Also, orders for labs and abdominal xray obtained.  Will  continue to monitor patient.  Earleen Reaper RN

## 2020-06-25 NOTE — Progress Notes (Signed)
Ann Sellers, the pts son, has her hearing aids and the daughter was notified about this situation as well. The patient has the left hearing aid in, and the right hearing aid will go home with the family.

## 2020-06-25 NOTE — Progress Notes (Signed)
PROGRESS NOTE    Ann Sellers  SWN:[id number] DOB: Nov 22, 1924 DOA: 07/19/2020 PCP: Reynold Bowen, MD  Brief Narrative: : Ann Sellers is a 86/F w/ COPD, chronic respiratory failure on 2 L home O2, chronic diastolic CHF, type 2 diabetes mellitus, essential hypertensio, moderate aortic stenosis, chronic kidney disease stage 4 was brought to the emergency room with sudden onset confusion and increase oxygen requirement.  She was in her usual state of health prior to this.  Woke up this morning and was fixing breakfast with her son when suddenly she was noted to be confused.  Her oxygen level was 83% on 2 L. EMS was called and patient was brought into the ED for further evaluation and management.  She has had occasional choking episodes in the past when she eats meats.  In the ED patient was somnolent but easily arousable to voices and confused.  ABG showed no evidence of hypercarbia with pH of 7.4.  UA negative.  CT head ordered and pending.  Chest x-ray completed with bilateral pulmonary infiltrates R>L and noted bilateral pleural effusion, also noted to have acute kidney injury with creatinine of 4.4 and severe hyperkalemia   Assessment & Plan:   Acute on chronic hypoxic resp failure Aspiration pneumonia/multifocal -Chest x-ray noted bilateral pneumonia, history suggestive of dysphagia, COVID-19 PCR is negative -Continue cefepime and Flagyl, day 3 today, discontinued vancomycin -SLP evaluation completed, felt to be moderate aspiration risk, regular diet and thin liquids recommended -Continue duo nebs -Wean O2 down to 2 L as tolerated  Acute metabolic encephalopathy -Likely in the setting of pneumonia, renal failure and gabapentin with decreased clearance -Improving, monitor, also very hard of hearing  Acute kidney injury on chronic kidney disease stage 4 Hyperkalemia -Baseline creatinine around 2.2 -Creatinine on admission was 4.4 with potassium of 6.5 -Renal ultrasound without  hydronephrosis, etiology likely multifactorial in the setting of dehydration from infection, ongoing diuretic use, significantly elevated BUN raising concern for prerenal azotemia -Lasix on hold, hydrated with gentle IV fluids -Continue normal saline at 50 cc an hour for 10 more hours -No urine output recorded in first 24 hours of admission hence Foley catheter was placed in the setting of severe renal failure, more than 1 L output yesterday -Creatinine starting to trend down, BMP in a.m.  Severe anemia -Etiology unclear, patient denies any overt bleeding, no history of melena or hematochezia -Could be worsened by hemodilution -Anemia panel with iron deficiency as well, transfused 1 unit of PRBC 9/4 -Give IV iron x1 now -Monitor CBC  COPD/chronic respiratory failure -Stable, nebs as needed -Add incentive spirometry and antibiotics for aspiration pneumonia as noted above  Chronic diastolic CHF -Diuretics on hold in setting of renal failure, continue IV fluids today  Type 2 diabetes mellitus -Oral hypoglycemics on hold, continue sliding scale insulin    DVT prophylaxis: Restart subcu heparin Code Status: DNR Family Communication: No family at bedside, updated daughter 9/ Disposition Plan:  Status is: Inpatient  Remains inpatient appropriate because:Inpatient level of care appropriate due to severity of illness   Dispo:  Patient From: Home  Planned Disposition: Home with Health Care Svc  Expected discharge date: in 2-3days if kidney function improves  Medically stable for discharge: No   Consultants:     Procedures:   Antimicrobials:    Subjective: -Some confusion overnight, pulling off IVs, now in mittens  Objective: Vitals:   06/25/20 0351 06/25/20 0500 06/25/20 0611 06/25/20 0921  BP: (!) 150/79 (!) 179/113 (!) 123/105 Marland Kitchen)  147/61  Pulse: 98 100 97 78  Resp: (!) 32 (!) 23 (!) 24 (!) 26  Temp: 98.4 F (36.9 C) 98.5 F (36.9 C) 98.5 F (36.9 C) 100.1 F  (37.8 C)  TempSrc:    Oral  SpO2: 92% (!) 89% 92% 96%  Weight:   98.9 kg   Height:        Intake/Output Summary (Last 24 hours) at 06/25/2020 1127 Last data filed at 06/25/2020 0600 Gross per 24 hour  Intake 1297.33 ml  Output 1250 ml  Net 47.33 ml   Filed Weights   07/02/2020 1615 06/24/20 0137 06/25/20 0611  Weight: 73.9 kg 98.9 kg 98.9 kg    Examination:  General exam: Elderly pleasant female, very hard of hearing, resting comfortably in bed, no distress HEENT: Neck obese unable to assess JVD CVS: S1-S2, regular rate rhythm, systolic murmur noted Lungs: Few scattered basilar rhonchi Abdomen: Soft, nontender, bowel sounds present Extremities: No edema  Extremities: No edema Psychiatry: Poor insight and judgment    Data Reviewed:   CBC: Recent Labs  Lab 06/27/2020 1411 07/15/2020 1537 06/24/20 0609 06/24/20 1259 06/25/20 0507  WBC 9.6  --  6.4 6.3 8.6  NEUTROABS 8.4*  --  5.5  --  7.6  HGB 9.0* 10.2* 6.8* 6.9* 9.2*  HCT 32.1* 30.0* 22.9* 22.9* 30.0*  MCV 108.4*  --  102.2* 100.9* 98.0  PLT 150  --  110* 113* 650*   Basic Metabolic Panel: Recent Labs  Lab 06/21/2020 1411 07/12/2020 1537 06/24/20 0609 06/25/20 0507  NA 138 135 139 139  K 6.5* 6.0* 4.6 5.5*  CL 103  --  102 105  CO2 23  --  25 23  GLUCOSE 159*  --  197* 176*  BUN 90*  --  94* 86*  CREATININE 4.41*  --  4.86* 3.97*  CALCIUM 10.1  --  9.4 9.6   GFR: Estimated Creatinine Clearance: 10.1 mL/min (A) (by C-G formula based on SCr of 3.97 mg/dL (H)). Liver Function Tests: Recent Labs  Lab 06/27/2020 1411  AST 14*  ALT 8  ALKPHOS 62  BILITOT 0.5  PROT 6.2*  ALBUMIN 3.6   No results for input(s): LIPASE, AMYLASE in the last 168 hours. No results for input(s): AMMONIA in the last 168 hours. Coagulation Profile: Recent Labs  Lab 06/24/20 0609  INR 1.1   Cardiac Enzymes: Recent Labs  Lab 06/24/20 1259  CKTOTAL 43   BNP (last 3 results) Recent Labs    11/17/19 0918 11/23/19 1251  12/15/19 1117  PROBNP 1,533* 3,200* 2,498*   HbA1C: Recent Labs    06/24/20 0609  HGBA1C 5.7*   CBG: Recent Labs  Lab 06/24/20 1657 06/24/20 2021 06/25/20 0020 06/25/20 0347 06/25/20 0755  GLUCAP 122* 143* 124* 185* 151*   Lipid Profile: No results for input(s): CHOL, HDL, LDLCALC, TRIG, CHOLHDL, LDLDIRECT in the last 72 hours. Thyroid Function Tests: No results for input(s): TSH, T4TOTAL, FREET4, T3FREE, THYROIDAB in the last 72 hours. Anemia Panel: Recent Labs    06/24/20 1259  VITAMINB12 >7,500*  FOLATE 15.9  FERRITIN 68  TIBC 223*  IRON 14*  RETICCTPCT 1.3   Urine analysis:    Component Value Date/Time   COLORURINE YELLOW 07/12/2020 1411   APPEARANCEUR CLEAR 07/18/2020 1411   LABSPEC 1.015 07/17/2020 1411   PHURINE 5.0 07/17/2020 1411   GLUCOSEU NEGATIVE 07/16/2020 1411   HGBUR NEGATIVE 07/16/2020 1411   [place] 07/03/2020 1411   [place] 07/05/2020 1411  PROTEINUR 100 (A) 07/09/2020 1411   UROBILINOGEN 0.2 02/01/2014 1732   NITRITE NEGATIVE 06/21/2020 1411   LEUKOCYTESUR NEGATIVE 07/08/2020 1411   Sepsis Labs: @LABRCNTIP (procalcitonin:4,lacticidven:4)  ) Recent Results (from the past 240 hour(s))  Blood Culture (routine x 2)     Status: None (Preliminary result)   Collection Time: 07/15/2020  2:00 PM   Specimen: BLOOD  Result Value Ref Range Status   Specimen Description BLOOD LEFT ANTECUBITAL  Final   Special Requests   Final    BOTTLES DRAWN AEROBIC AND ANAEROBIC Blood Culture results may not be optimal due to an inadequate volume of blood received in culture bottles   Culture   Final    NO GROWTH 2 DAYS Performed at [facility] [address] [place], [place] [address]    Report Status PENDING  Incomplete  SARS Coronavirus 2 by RT PCR (hospital order, performed in [facility] hospital lab) Nasopharyngeal Nasopharyngeal Swab     Status: None   Collection Time: 06/21/2020  2:11 PM   Specimen: Nasopharyngeal Swab    Result Value Ref Range Status   SARS Coronavirus 2 NEGATIVE NEGATIVE Final    Comment: (NOTE) SARS-CoV-2 target nucleic acids are NOT DETECTED.  The SARS-CoV-2 RNA is generally detectable in upper and lower respiratory specimens during the acute phase of infection. The lowest concentration of SARS-CoV-2 viral copies this assay can detect is 250 copies / mL. A negative result does not preclude SARS-CoV-2 infection and should not be used as the sole basis for treatment or other patient management decisions.  A negative result may occur with improper specimen collection / handling, submission of specimen other than nasopharyngeal swab, presence of viral mutation(s) within the areas targeted by this assay, and inadequate number of viral copies (<250 copies / mL). A negative result must be combined with clinical observations, patient history, and epidemiological information.  Fact Sheet for Patients:   StrictlyIdeas.no  Fact Sheet for Healthcare Providers: BankingDealers.co.za  This test is not yet approved or  cleared by the [place] FDA and has been authorized for detection and/or diagnosis of SARS-CoV-2 by FDA under an Emergency Use Authorization (EUA).  This EUA will remain in effect (meaning this test can be used) for the duration of the COVID-19 declaration under Section 564(b)(1) of the Act, 21 U.S.C. section 360bbb-3(b)(1), unless the authorization is terminated or revoked sooner.  Performed at [facility], [place] [address], [place], [place] [address]   Urine culture     Status: Abnormal (Preliminary result)   Collection Time: 06/24/2020  2:20 PM   Specimen: In/Out Cath Urine  Result Value Ref Range Status   Specimen Description IN/OUT CATH URINE  Final   Special Requests   Final    NONE Performed at [facility], [place] [address], [place], [place] [address]    Culture >=100,000 COLONIES/mL KLEBSIELLA PNEUMONIAE  (A)  Final   Report Status PENDING  Incomplete  Blood Culture (routine x 2)     Status: None (Preliminary result)   Collection Time: 07/19/2020  4:23 PM   Specimen: BLOOD  Result Value Ref Range Status   Specimen Description BLOOD BLOOD RIGHT FOREARM  Final   Special Requests   Final    BOTTLES DRAWN AEROBIC AND ANAEROBIC Blood Culture results may not be optimal due to an inadequate volume of blood received in culture bottles   Culture   Final    NO GROWTH 2 DAYS Performed at [facility], [address] [place], [place] [address]    Report Status PENDING  Incomplete         Radiology Studies: DG Abd 1 View  Result Date: 06/25/2020 CLINICAL DATA:  Abdominal pain EXAM: ABDOMEN - 1 VIEW COMPARISON:  Partial comparison to chest radiograph dated 06/28/2020 FINDINGS: Nonobstructive bowel gas pattern. Vascular calcifications. Left pleural effusion. Degenerative changes of the lumbar spine. IMPRESSION: Nonobstructive bowel gas pattern. Left pleural effusion. Electronically Signed   By: Julian Hy M.D.   On: 06/25/2020 06:20   CT Head Wo Contrast  Result Date: 07/02/2020 CLINICAL DATA:  Delirium EXAM: CT HEAD WITHOUT CONTRAST TECHNIQUE: Contiguous axial images were obtained from the base of the skull through the vertex without intravenous contrast. COMPARISON:  CT brain 03/30/2020 FINDINGS: Brain: No acute territorial infarction or hemorrhage is visualized. Stable 2.8 x 2.7 cm calcified extra-axial skull base mass consistent with planum sphenoidale meningioma. Mild atrophy. Mild hypodensity in the white matter consistent with chronic small vessel ischemic change. Stable ventricle size. Vascular: No hyperdense vessel. Vertebral and carotid vascular calcification Skull: Normal. Negative for fracture or focal lesion. Sinuses/Orbits: No acute finding. Other: None IMPRESSION: 1. No CT evidence for acute intracranial abnormality. 2. Atrophy and mild chronic small vessel ischemic changes of the  white matter. 3. 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 Electronically Signed   By: Donavan Foil M.D.   On: 07/11/2020 19:23   CT CHEST WO CONTRAST  Result Date: 07/02/2020 CLINICAL DATA:  86 year old with respiratory failure. EXAM: CT CHEST WITHOUT CONTRAST TECHNIQUE: Multidetector CT imaging of the chest was performed following the standard protocol without IV contrast. COMPARISON:  Radiograph earlier this day. FINDINGS: Cardiovascular: Mild multi chamber cardiomegaly. There are coronary artery calcifications. Mitral annulus calcifications. Atherosclerosis of the thoracic aorta with mild tortuosity. No aortic aneurysm. No pericardial effusion. Mediastinum/Nodes: No enlarged mediastinal lymph nodes. Limited assessment for hilar adenopathy in the absence of IV contrast as well as patient motion. Small hiatal hernia. There is no esophageal wall thickening. Hypodense 18 mm right thyroid nodule, stable from prior exam. In the setting of significant comorbidities or limited life expectancy, no follow-up recommended (ref: J Am Coll Radiol. 2015 Feb;12(2): 143-50). Lungs/Pleura: Multifocal nodular airspace disease throughout both lungs, some of which appears ground-glass. Confluent opacity in the dependent right lower lobe with air bronchograms. There are small bilateral pleural effusions. Associated dependent opacity at the left lung base is likely compressive atelectasis. Occasional smooth scattered septal thickening. No debris in the trachea or bronchial tree. Upper Abdomen: High-density ingested material in the stomach. No other acute finding. Musculoskeletal: Degenerative change in the spine. There are no acute or suspicious osseous abnormalities. IMPRESSION: 1. Multifocal nodular airspace disease throughout both lungs, some of which appears ground-glass. Confluent opacity in the dependent right lower lobe with air bronchograms. Findings most consistent with multifocal pneumonia. Given the occasional  nodular appearance, recommend radiographic follow-up to ensure resolution. 2. Small bilateral pleural effusions. Left basilar opacity is more typical of compressive atelectasis. 3. Mild cardiomegaly. Coronary artery calcifications. 4. Mild smooth septal thickening which can be seen with mild pulmonary edema. Aortic Atherosclerosis (ICD10-I70.0). Electronically Signed   By: Keith Rake M.D.   On: 06/29/2020 19:52   US RENAL  Result Date: 07/11/2020 CLINICAL DATA:  Acute kidney injury EXAM: RENAL / URINARY TRACT ULTRASOUND COMPLETE COMPARISON:  12/26/2019 FINDINGS: Right Kidney: Renal measurements: 9.3 x 5.1 x 5.4 cm = volume: 135 mL. Normal echotexture. No hydronephrosis. 4 cm cyst in the lower  pole. 1.5 cm hypoechoic area within the upper pole, similar to prior study compatible with complex cyst. Left Kidney: Renal measurements: 9.9 x 5.1 x 5.7 cm = volume: 151 mL. Echogenicity within normal limits. No mass or hydronephrosis visualized. Bladder: Appears normal for degree of bladder distention. Other: None. IMPRESSION: No acute findings.  No hydronephrosis. Right renal cysts are unchanged. Electronically Signed   By: Rolm Baptise M.D.   On: 07/12/2020 20:32   DG Chest Port 1 View  Result Date:  CLINICAL DATA:  Sepsis, dyspnea, generalized weakness EXAM: PORTABLE CHEST 1 VIEW COMPARISON:  01/07/2020 FINDINGS: The lungs are symmetrically well expanded. Bibasilar airspace infiltrates are present, likely infectious or inflammatory in nature. Small left pleural effusion is present. No pneumothorax. Cardiac size is within normal limits, unchanged. Pulmonary vascularity is normal. No acute bone abnormality. IMPRESSION: Bibasilar airspace infiltrates, likely infectious or inflammatory in nature. Small left pleural possible parapneumonic effusion. Electronically Signed   By: Fidela Salisbury MD   On: 06/29/2020 15:57        Scheduled Meds: . sodium chloride   Intravenous Once  . sodium chloride    Intravenous Once  . aspirin  81 mg Oral Daily  . calcium acetate  667 mg Oral TID WC  . Chlorhexidine Gluconate Cloth  6 each Topical Daily  . fluticasone  2 spray Each Nare Daily  . heparin injection (subcutaneous)  5,000 Units Subcutaneous Q8H  . insulin aspart  0-5 Units Subcutaneous QHS  . insulin aspart  0-9 Units Subcutaneous TID WC  . mouth rinse  15 mL Mouth Rinse BID  . metoprolol succinate  25 mg Oral Daily  . mometasone-formoterol  2 puff Inhalation BID  . montelukast  10 mg Oral QHS  . pantoprazole  40 mg Oral Daily  . pentoxifylline  400 mg Oral TID WC  . sodium zirconium cyclosilicate  5 g Oral BID   Continuous Infusions: . sodium chloride Stopped (06/25/20 0949)  . ceFEPime (MAXIPIME) IV 1 g (06/24/20 2017)  . metronidazole       LOS: 2 days    Time spent: 76min Domenic Polite, MD [facility]  06/25/2020, 11:27 AM
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
## 2020-06-26 ENCOUNTER — Inpatient Hospital Stay (HOSPITAL_COMMUNITY): Payer: Medicare Other

## 2020-06-26 DIAGNOSIS — J690 Pneumonitis due to inhalation of food and vomit: Secondary | ICD-10-CM | POA: Diagnosis not present

## 2020-06-26 DIAGNOSIS — G9341 Metabolic encephalopathy: Secondary | ICD-10-CM | POA: Diagnosis not present

## 2020-06-26 DIAGNOSIS — Z66 Do not resuscitate: Secondary | ICD-10-CM | POA: Diagnosis not present

## 2020-06-26 DIAGNOSIS — Z20822 Contact with and (suspected) exposure to covid-19: Secondary | ICD-10-CM | POA: Diagnosis not present

## 2020-06-26 LAB — BASIC METABOLIC PANEL
Anion gap: 15 (ref 5–15)
BUN: 80 mg/dL — ABNORMAL HIGH (ref 8–23)
CO2: 22 mmol/L (ref 22–32)
Calcium: 10.1 mg/dL (ref 8.9–10.3)
Chloride: 106 mmol/L (ref 98–111)
Creatinine, Ser: 3.47 mg/dL — ABNORMAL HIGH (ref 0.44–1.00)
GFR calc Af Amer: 12 mL/min — ABNORMAL LOW (ref >60)
GFR calc non Af Amer: 11 mL/min — ABNORMAL LOW (ref >60)
Glucose, Bld: 120 mg/dL — ABNORMAL HIGH (ref 70–99)
Potassium: 5.4 mmol/L — ABNORMAL HIGH (ref 3.5–5.1)
Sodium: 143 mmol/L (ref 135–145)

## 2020-06-26 LAB — CBC WITH DIFFERENTIAL/PLATELET
Abs Immature Granulocytes: 0.05 10*3/uL (ref 0.00–0.07)
Basophils Absolute: 0 10*3/uL (ref 0.0–0.1)
Basophils Relative: 0 %
Eosinophils Absolute: 0 10*3/uL (ref 0.0–0.5)
Eosinophils Relative: 0 %
HCT: 33.5 % — ABNORMAL LOW (ref 36.0–46.0)
Hemoglobin: 10.3 g/dL — ABNORMAL LOW (ref 12.0–15.0)
Immature Granulocytes: 1 %
Lymphocytes Relative: 5 %
Lymphs Abs: 0.4 10*3/uL — ABNORMAL LOW (ref 0.7–4.0)
MCH: 30.9 pg (ref 26.0–34.0)
MCHC: 30.7 g/dL (ref 30.0–36.0)
MCV: 100.6 fL — ABNORMAL HIGH (ref 80.0–100.0)
Monocytes Absolute: 0.5 10*3/uL (ref 0.1–1.0)
Monocytes Relative: 6 %
Neutro Abs: 6.2 10*3/uL (ref 1.7–7.7)
Neutrophils Relative %: 88 %
Platelets: 143 10*3/uL — ABNORMAL LOW (ref 150–400)
RBC: 3.33 MIL/uL — ABNORMAL LOW (ref 3.87–5.11)
RDW: 15.5 % (ref 11.5–15.5)
WBC: 7.1 10*3/uL (ref 4.0–10.5)
nRBC: 0 % (ref 0.0–0.2)

## 2020-06-26 LAB — BLOOD GAS, ARTERIAL
Acid-base deficit: 3.1 mmol/L — ABNORMAL HIGH (ref 0.0–2.0)
Bicarbonate: 23.2 mmol/L (ref 20.0–28.0)
Drawn by: 53309
FIO2: 28
O2 Saturation: 95.6 %
Patient temperature: 37.2
pCO2 arterial: 56.7 mmHg — ABNORMAL HIGH (ref 32.0–48.0)
pH, Arterial: 7.237 — ABNORMAL LOW (ref 7.350–7.450)
pO2, Arterial: 80.9 mmHg — ABNORMAL LOW (ref 83.0–108.0)

## 2020-06-26 LAB — GLUCOSE, CAPILLARY
Glucose-Capillary: 102 mg/dL — ABNORMAL HIGH (ref 70–99)
Glucose-Capillary: 106 mg/dL — ABNORMAL HIGH (ref 70–99)
Glucose-Capillary: 108 mg/dL — ABNORMAL HIGH (ref 70–99)
Glucose-Capillary: 113 mg/dL — ABNORMAL HIGH (ref 70–99)
Glucose-Capillary: 85 mg/dL (ref 70–99)
Glucose-Capillary: 91 mg/dL (ref 70–99)
Glucose-Capillary: 94 mg/dL (ref 70–99)

## 2020-06-26 LAB — TROPONIN I (HIGH SENSITIVITY)
Troponin I (High Sensitivity): 392 ng/L (ref <18)
Troponin I (High Sensitivity): 316 ng/L (ref <18)

## 2020-06-26 LAB — OCCULT BLOOD X 1 CARD TO LAB, STOOL: Fecal Occult Bld: NEGATIVE

## 2020-06-26 MED ORDER — SODIUM CHLORIDE 0.9 % IV SOLN
500.0000 mg | Freq: Two times a day (BID) | INTRAVENOUS | Status: DC
  Administered 2020-06-26 – 2020-06-30 (×8): 500 mg via INTRAVENOUS
  Filled 2020-06-26 (×9): qty 0.5

## 2020-06-26 MED ORDER — SODIUM CHLORIDE 0.9 % IV SOLN: INTRAVENOUS | Status: DC

## 2020-06-26 MED ORDER — METOPROLOL TARTRATE 5 MG/5ML IV SOLN: 5.0000 mg | Freq: Once | INTRAVENOUS | Status: DC

## 2020-06-26 MED ORDER — SODIUM ZIRCONIUM CYCLOSILICATE 10 G PO PACK
10.0000 g | PACK | Freq: Two times a day (BID) | ORAL | Status: DC
  Administered 2020-06-26 – 2020-06-27 (×3): 10 g via ORAL
  Filled 2020-06-26 (×7): qty 1

## 2020-06-26 MED ORDER — METOPROLOL TARTRATE 5 MG/5ML IV SOLN
5.0000 mg | Freq: Once | INTRAVENOUS | Status: DC
  Filled 2020-06-26: qty 5

## 2020-06-26 MED ORDER — METOPROLOL TARTRATE 5 MG/5ML IV SOLN
5.0000 mg | Freq: Once | INTRAVENOUS | Status: AC
  Administered 2020-06-26: 5 mg via INTRAVENOUS
  Filled 2020-06-26: qty 5

## 2020-06-26 NOTE — Plan of Care (Signed)
  Problem: Activity: Goal: Risk for activity intolerance will decrease Outcome: Progressing   Problem: Nutrition: Goal: Adequate nutrition will be maintained Outcome: Progressing   Problem: Safety: Goal: Ability to remain free from injury will improve Outcome: Progressing   

## 2020-06-26 NOTE — Progress Notes (Signed)
Patient's BP is 130/111 and HR 130-140s after 58mL of IV Metoprolol. MD on call paged Clearence Ped) to see if dose needs to be repeated vs waiting it out or giving something PO. Awaiting response.

## 2020-06-26 NOTE — Progress Notes (Signed)
Pharmacy Antibiotic Note  Ann Sellers is a 86 y.o. female admitted on 07/14/2020 with UTI and possible aspiration pneumonia.  Pharmacy has been consulted for meropenem dosing. This is day 1 of meropenem therapy. Pt is also receiving a 5 day course of flagyl per MD and has received 3 days of cefepime prior to today. WBC is WNL 7.1; pt is afebrile, and renal function has improved with Scr 3.47 today.   UCx: ESBL Kleb Pneumo   Plan:  Discontinue cefepime 1g IV q24hours Start Meropenem 500 q12h  Flagyl 500mg  IV q8hours x5 days total  Monitor renal function, clinical progress, and micro data.   Height: 5\' 4"  (162.6 cm) Weight: 95.7 kg (210 lb 15.7 oz) IBW/kg (Calculated) : 54.7  Temp (24hrs), Avg:98.6 F (37 C), Min:97.9 F (36.6 C), Max:99.5 F (37.5 C)  Recent Labs  Lab 07/09/2020 1411 06/24/20 0609 06/24/20 1259 06/25/20 0507 06/25/20 0801 06/26/20 0333  WBC 9.6 6.4 6.3 8.6  --  7.1  CREATININE 4.41* 4.86*  --  3.97*  --  3.47*  LATICACIDVEN 0.9  --   --  1.1 0.8  --     Estimated Creatinine Clearance: 11.4 mL/min (A) (by C-G formula based on SCr of 3.47 mg/dL (H)).    Allergies  Allergen Reactions  . Losartan Cough  . Snake Antivenin [Antivenin Crotalidae Polyvalent (Snake Antivenin)] Other (See Comments)    PARALYSIS    Antimicrobials this admission: 9/3 vancomycin x1   9/3 cefepime >> 9/5 9/3 metronidazole> 9/8 9/6 meropenem>>   Microbiology results: 9/3 BCx: ng 3 days 9/3 UCx: klebsiella pneumo (ESBL: sensitive to imipenem, nitro, zosyn) 9/3 COVID: negative  Wilson Singer, PharmD PGY1 Pharmacy Resident 06/26/2020 1:51 PM

## 2020-06-26 NOTE — Progress Notes (Signed)
Orders received from DO on call.

## 2020-06-26 NOTE — TOC Initial Note (Signed)
Transition of Care [facility] - Initial/Assessment Note    Patient Details  Name: Ann Sellers MRN: [id number] Date of Birth: February 10, 1925  Transition of [facility]) CM/SW Contact:    Trula Ore, [place] Phone Number: 06/26/2020, 3:41 PM  Clinical Narrative:                  CSW spoke with patients son Ann Sellers who declined SNF at this time for patient. Patients son Ann Sellers says patient has not had a good experience in the past with SNF. Patients son says that patient lives at home with him.CSW asked patients son if he would be interested in [facility] for patient. Patients son said that sounds like a better option for patient. Patients son let CSW know if he changes his mind about SNF placement he will let CSW know. CSW let patients son know that case manager will follow up with him about home health services for patient.  CSW will continue to follow.  Expected Discharge Plan: [place] Barriers to Discharge: Continued Medical Work up   Patient Goals and CMS Choice Patient states their goals for this hospitalization and ongoing recovery are:: to go home with home health services CMS Medicare.gov Compare Post Acute Care list provided to:: Patient Represenative (must comment) Choice offered to / list presented to : Adult Children (son Ann Sellers)  Expected Discharge Plan and Services Expected Discharge Plan: [place]       Living arrangements for the past 2 months: Single Family Home                                      Prior Living Arrangements/Services Living arrangements for the past 2 months: Single Family Home Lives with:: Self, Adult Children (son Ann Sellers) Patient language and need for interpreter reviewed:: Yes Do you feel safe going back to the place where you live?: Yes      Need for Family Participation in Patient Care: Yes (Comment) Care giver support system in place?: Yes (comment)   Criminal Activity/Legal Involvement  Pertinent to Current Situation/Hospitalization: No - Comment as needed  Activities of Daily Living Home Assistive Devices/Equipment: None ADL Screening (condition at time of admission) Patient's cognitive ability adequate to safely complete daily activities?: No Is the patient deaf or have difficulty hearing?: Yes Does the patient have difficulty seeing, even when wearing glasses/contacts?: No Does the patient have difficulty concentrating, remembering, or making decisions?: Yes Patient able to express need for assistance with ADLs?: Yes Does the patient have difficulty dressing or bathing?: Yes Independently performs ADLs?: No Communication: Independent Dressing (OT): Dependent Is this a change from baseline?: Pre-admission baseline Grooming: Dependent Is this a change from baseline?: Pre-admission baseline Feeding: Dependent Is this a change from baseline?: Pre-admission baseline Toileting: Dependent Is this a change from baseline?: Pre-admission baseline In/Out Bed: Dependent Is this a change from baseline?: Pre-admission baseline Walks in Home: Dependent Is this a change from baseline?: Pre-admission baseline Does the patient have difficulty walking or climbing stairs?: Yes Weakness of Legs: Both Weakness of Arms/Hands: None  Permission Sought/Granted Permission sought to share information with : Case Manager, Family Supports, Investment banker, corporate granted to share info w AGENCY: [facility]        Emotional Assessment       Orientation: : Oriented to Self Alcohol / Substance Use:  Not Applicable Psych Involvement: No (comment)  Admission diagnosis:  Hypoxia [R09.02] CAP (community acquired pneumonia) [J18.9] AKI (acute kidney injury) ([place]) [N17.9] Patient Active Problem List   Diagnosis Date Noted   CAP (community acquired pneumonia) 07/20/2020   Conductive hearing loss of right ear with restricted hearing of left ear 05/12/2020   Right  chronic serous otitis media 05/12/2020   Altered mental status    Acute renal failure superimposed on stage 3b chronic kidney disease ([place])    Acute encephalopathy 01/03/2020   Acute respiratory failure ([place])    Pressure injury of skin 12/28/2019   Pneumonia 12/26/2019   Presbycusis of both ears 04/04/2017   PAF (paroxysmal atrial fibrillation) ([place]) 12/23/2016   Aortic stenosis, moderate 09/16/2016   Community acquired pneumonia 09/13/2016   Lower extremity weakness 09/13/2016   Chronic diastolic heart failure ([place]) 09/13/2016   Severe persistent chronic asthma without complication 97/41/6384   Morbid obesity ([place]) 07/06/2015   Edema 07/22/2014   Varicose veins of lower extremities with complications 53/64/6803   Chronic kidney disease (CKD), stage IV (severe) ([place]) 02/01/2014   Right rotator cuff tear    Hypertension    Arthritis    Insulin dependent diabetes mellitus with complications    Spinal stenosis    Wears glasses    Wears dentures    Wears hearing aid    Asthma 07/11/2011   Vitamin D deficiency 07/11/2011   Osteoarthritis 07/11/2011   Cough 07/11/2011   PCP:  Reynold Bowen, MD Pharmacy:   [facility], [place] - [address] [place] [address] Phone: [phone] Fax: [phone]  [facility], [place] - [address] [place] [address] Phone: [phone] Fax: [place], [place] - [address] AT [address] [place] [address] Phone: [phone] Fax: [phone]     Social Determinants of Health (SDOH) Interventions    Readmission Risk Interventions Readmission Risk Prevention Plan 01/11/2020  Transportation Screening Complete  PCP or Specialist Appt within 3-5 Days Complete  HRI or [place] Complete   Social Work Consult for [place] Planning/Counseling Complete  Palliative Care Screening Not Applicable  Medication Review Press photographer) Referral to Pharmacy  Some recent data might be hidden

## 2020-06-26 NOTE — Progress Notes (Signed)
PROGRESS NOTE    Ann Sellers  GUY:[id number] DOB: 1925-03-30 DOA: 07/15/2020 PCP: Reynold Bowen, MD  Brief Narrative: : Ann Sellers is a 89/F w/ COPD, chronic respiratory failure on 2 L home O2, chronic diastolic CHF, type 2 diabetes mellitus, essential hypertensio, moderate aortic stenosis, chronic kidney disease stage 4 was brought to the emergency room with sudden onset confusion and increase oxygen requirement.  She was in her usual state of health prior to this.  Woke up this morning and was fixing breakfast with her son when suddenly she was noted to be confused.  Her oxygen level was 83% on 2 L. EMS was called and patient was brought into the ED for further evaluation and management.  She has had occasional choking episodes in the past when she eats meats. -In the ED patient was somnolent but easily arousable to voices and confused.  ABG showed no evidence of hypercarbia with pH of 7.4.  UA negative.  CT head ordered and pending.  Chest x-ray completed with bilateral pulmonary infiltrates R>L and noted bilateral pleural effusion, also noted to have acute kidney injury with creatinine of 4.4 and severe hyperkalemia   Assessment & Plan:   Acute on chronic hypoxic and hypercarbic resp failure Aspiration pneumonia/multifocal -Chest x-ray noted bilateral pneumonia, history suggestive of dysphagia, COVID-19 PCR is negative -Treated with IV cefepime and Flagyl, day 4 today, given concomitant ESBL in urine will change to meropenem today -SLP evaluation completed, felt to be moderate aspiration risk, regular diet and thin liquids recommended -Today appears slightly more lethargic, will check ABG and repeat chest x-ray -Continue duo nebs -Wean O2 down to 2 L as tolerated  Acute metabolic encephalopathy -Likely in the setting of pneumonia, renal failure and gabapentin with decreased clearance -Was improving, this morning appears more lethargic, check ABG  Acute kidney injury on chronic kidney  disease stage 4 Hyperkalemia -Baseline creatinine around 2.2 -Creatinine on admission was 4.4 with potassium of 6.5 -Renal ultrasound without hydronephrosis, etiology likely multifactorial in the setting of dehydration from infection, ongoing diuretic use, significantly elevated BUN raising concern for prerenal azotemia -Lasix on hold, hydrated with gentle IV fluids -Continue normal saline at 50 cc an hour for 10 more hours -No urine output recorded in first 24 hours of admission hence Foley catheter was placed in the setting of severe renal failure, creatinine marginally better, will discontinue Foley catheter  ESBL Klebsiella UTI -With intermittent lethargy and confusion unable to assess if she is truly symptomatic from this -Will cover with meropenem which will also treat aspiration pneumonia  Iron deficiency anemia -patient denies any overt bleeding -Could be worsened by hemodilution -Anemia panel with iron deficiency as well, transfused 1 unit of PRBC 9/4 -Given IV iron x1 now -Not appropriate for gastroenterology evaluation at this time  COPD/chronic respiratory failure -Stable, nebs as needed -Add incentive spirometry and antibiotics for aspiration pneumonia as noted above  Chronic diastolic CHF -Diuretics on hold in setting of renal failure, continue IV fluids today  Type 2 diabetes mellitus -Oral hypoglycemics on hold, continue sliding scale insulin    DVT prophylaxis: subcu heparin Code Status: DNR Family Communication: Updated son Jimmy at bedside Disposition Plan:  Status is: Inpatient  Remains inpatient appropriate because:Inpatient level of care appropriate due to severity of illness   Dispo:  Patient From: Home  Planned Disposition: Home with Health Care Svc  Expected discharge date: in 2-3days if kidney function improves  Medically stable for discharge: No   Consultants:  Procedures:   Antimicrobials:    Subjective: -Drowsy and somnolent  this morning, some confusion overnight  Objective: Vitals:   06/26/20 0416 06/26/20 0500 06/26/20 0834 06/26/20 1018  BP: (!) 150/71   (!) 189/91  Pulse: (!) 102   (!) 105  Resp: 20   16  Temp:    99 F (37.2 C)  TempSrc:    Oral  SpO2: 94%  95% 92%  Weight:  95.7 kg    Height:        Intake/Output Summary (Last 24 hours) at 06/26/2020 1339 Last data filed at 06/26/2020 1300 Gross per 24 hour  Intake 690 ml  Output 1750 ml  Net -1060 ml   Filed Weights   06/25/20 1943 06/25/20 2109 06/26/20 0500  Weight: 95.7 kg 95.7 kg 95.7 kg    Examination:  General exam: Elderly chronically ill female hard of hearing, somnolent, easily arousable, no distress HEENT: Neck obese unable to assess JVD CVS: S1-S2, regular rate rhythm, systolic ejection murmur Lungs: Few scattered rhonchi bilaterally Abdomen: Soft, nontender, bowel sounds present Extremities: No edema  Psychiatry: Poor insight and judgment    Data Reviewed:   CBC: Recent Labs  Lab 07/06/2020 1411 06/26/2020 1411 06/21/2020 1537 06/24/20 0609 06/24/20 1259 06/25/20 0507 06/26/20 0333  WBC 9.6  --   --  6.4 6.3 8.6 7.1  NEUTROABS 8.4*  --   --  5.5  --  7.6 6.2  HGB 9.0*   < > 10.2* 6.8* 6.9* 9.2* 10.3*  HCT 32.1*   < > 30.0* 22.9* 22.9* 30.0* 33.5*  MCV 108.4*  --   --  102.2* 100.9* 98.0 100.6*  PLT 150  --   --  110* 113* 135* 143*   < > = values in this interval not displayed.   Basic Metabolic Panel: Recent Labs  Lab 07/07/2020 1411 07/09/2020 1537 06/24/20 0609 06/25/20 0507 06/26/20 0333  NA 138 135 139 139 143  K 6.5* 6.0* 4.6 5.5* 5.4*  CL 103  --  102 105 106  CO2 23  --  25 23 22   GLUCOSE 159*  --  197* 176* 120*  BUN 90*  --  94* 86* 80*  CREATININE 4.41*  --  4.86* 3.97* 3.47*  CALCIUM 10.1  --  9.4 9.6 10.1   GFR: Estimated Creatinine Clearance: 11.4 mL/min (A) (by C-G formula based on SCr of 3.47 mg/dL (H)). Liver Function Tests: Recent Labs  Lab 07/20/2020 1411  AST 14*  ALT 8  ALKPHOS  62  BILITOT 0.5  PROT 6.2*  ALBUMIN 3.6   No results for input(s): LIPASE, AMYLASE in the last 168 hours. No results for input(s): AMMONIA in the last 168 hours. Coagulation Profile: Recent Labs  Lab 06/24/20 0609  INR 1.1   Cardiac Enzymes: Recent Labs  Lab 06/24/20 1259  CKTOTAL 43   BNP (last 3 results) Recent Labs    11/17/19 0918 11/23/19 1251 12/15/19 1117  PROBNP 1,533* 3,200* 2,498*   HbA1C: Recent Labs    06/24/20 0609  HGBA1C 5.7*   CBG: Recent Labs  Lab 06/25/20 1512 06/26/20 0007 06/26/20 0415 06/26/20 0744 06/26/20 1031  GLUCAP 92 113* 102* 94 85   Lipid Profile: No results for input(s): CHOL, HDL, LDLCALC, TRIG, CHOLHDL, LDLDIRECT in the last 72 hours. Thyroid Function Tests: No results for input(s): TSH, T4TOTAL, FREET4, T3FREE, THYROIDAB in the last 72 hours. Anemia Panel: Recent Labs    06/24/20 1259  VITAMINB12 >7,500*  FOLATE 15.9  FERRITIN 68  TIBC 223*  IRON 14*  RETICCTPCT 1.3   Urine analysis:    Component Value Date/Time   COLORURINE YELLOW 07/16/2020 1411   APPEARANCEUR CLEAR 07/16/2020 1411   LABSPEC 1.015 06/26/2020 1411   PHURINE 5.0 07/19/2020 1411   GLUCOSEU NEGATIVE 07/10/2020 1411   HGBUR NEGATIVE 06/22/2020 1411   BILIRUBINUR NEGATIVE 07/10/2020 1411   KETONESUR NEGATIVE 06/24/2020 1411   PROTEINUR 100 (A) 07/05/2020 1411   UROBILINOGEN 0.2 02/01/2014 1732   NITRITE NEGATIVE 07/03/2020 1411   LEUKOCYTESUR NEGATIVE 07/19/2020 1411   Sepsis Labs: @LABRCNTIP (procalcitonin:4,lacticidven:4)  ) Recent Results (from the past 240 hour(s))  Blood Culture (routine x 2)     Status: None (Preliminary result)   Collection Time: 06/25/2020  2:00 PM   Specimen: BLOOD  Result Value Ref Range Status   Specimen Description BLOOD LEFT ANTECUBITAL  Final   Special Requests   Final    BOTTLES DRAWN AEROBIC AND ANAEROBIC Blood Culture results may not be optimal due to an inadequate volume of blood received in culture  bottles   Culture   Final    NO GROWTH 3 DAYS Performed at [facility], [place] [address] [place], [place] [address]    Report Status PENDING  Incomplete  SARS Coronavirus 2 by RT PCR (hospital order, performed in [facility] hospital lab) Nasopharyngeal Nasopharyngeal Swab     Status: None   Collection Time: 07/17/2020  2:11 PM   Specimen: Nasopharyngeal Swab  Result Value Ref Range Status   SARS Coronavirus 2 NEGATIVE NEGATIVE Final    Comment: (NOTE) SARS-CoV-2 target nucleic acids are NOT DETECTED.  The SARS-CoV-2 RNA is generally detectable in upper and lower respiratory specimens during the acute phase of infection. The lowest concentration of SARS-CoV-2 viral copies this assay can detect is 250 copies / mL. A negative result does not preclude SARS-CoV-2 infection and should not be used as the sole basis for treatment or other patient management decisions.  A negative result may occur with improper specimen collection / handling, submission of specimen other than nasopharyngeal swab, presence of viral mutation(s) within the areas targeted by this assay, and inadequate number of viral copies (<250 copies / mL). A negative result must be combined with clinical observations, patient history, and epidemiological information.  Fact Sheet for Patients:   StrictlyIdeas.no  Fact Sheet for Healthcare Providers: BankingDealers.co.za  This test is not yet approved or  cleared by the [place] FDA and has been authorized for detection and/or diagnosis of SARS-CoV-2 by FDA under an Emergency Use Authorization (EUA).  This EUA will remain in effect (meaning this test can be used) for the duration of the COVID-19 declaration under Section 564(b)(1) of the Act, 21 U.S.C. section 360bbb-3(b)(1), unless the authorization is terminated or revoked sooner.  Performed at [facility], [place] [address], [place], [place] [address]     Urine culture     Status: Abnormal   Collection Time: 07/14/2020  2:20 PM   Specimen: In/Out Cath Urine  Result Value Ref Range Status   Specimen Description IN/OUT CATH URINE  Final   Special Requests   Final    NONE Performed at [facility], [place] [address] [place], [place] [address]    Culture (A)  Final    >=100,000 COLONIES/mL KLEBSIELLA PNEUMONIAE Confirmed Extended Spectrum Beta-Lactamase Producer (ESBL).  In bloodstream infections from ESBL organisms, carbapenems are preferred over piperacillin/tazobactam. They are shown to have a lower risk of mortality.  Report Status 06/25/2020 FINAL  Final   Organism ID, Bacteria KLEBSIELLA PNEUMONIAE (A)  Final      Susceptibility   Klebsiella pneumoniae - MIC*    AMPICILLIN >=32 RESISTANT Resistant     CEFAZOLIN >=64 RESISTANT Resistant     CEFTRIAXONE >=64 RESISTANT Resistant     CIPROFLOXACIN >=4 RESISTANT Resistant     GENTAMICIN >=16 RESISTANT Resistant     IMIPENEM 0.5 SENSITIVE Sensitive     NITROFURANTOIN <=16 SENSITIVE Sensitive     TRIMETH/SULFA >=320 RESISTANT Resistant     AMPICILLIN/SULBACTAM >=32 RESISTANT Resistant     PIP/TAZO 16 SENSITIVE Sensitive     * >=100,000 COLONIES/mL KLEBSIELLA PNEUMONIAE  Blood Culture (routine x 2)     Status: None (Preliminary result)   Collection Time: 07/10/2020  4:23 PM   Specimen: BLOOD  Result Value Ref Range Status   Specimen Description BLOOD BLOOD RIGHT FOREARM  Final   Special Requests   Final    BOTTLES DRAWN AEROBIC AND ANAEROBIC Blood Culture results may not be optimal due to an inadequate volume of blood received in culture bottles   Culture   Final    NO GROWTH 3 DAYS Performed at [facility], [address] [place], [place] [address]    Report Status PENDING  Incomplete         Radiology Studies: DG Abd 1 View  Result Date: 06/25/2020 CLINICAL DATA:  Abdominal pain EXAM: ABDOMEN - 1 VIEW COMPARISON:  Partial comparison to chest radiograph dated  07/04/2020 FINDINGS: Nonobstructive bowel gas pattern. Vascular calcifications. Left pleural effusion. Degenerative changes of the lumbar spine. IMPRESSION: Nonobstructive bowel gas pattern. Left pleural effusion. Electronically Signed   By: Julian Hy M.D.   On: 06/25/2020 06:20   DG Chest Port 1 View  Result Date: 06/26/2020 CLINICAL DATA:  Hypoxia. EXAM: PORTABLE CHEST 1 VIEW COMPARISON:  June 23, 2020. FINDINGS: Stable cardiomediastinal silhouette. No pneumothorax is noted. Stable bilateral lung opacities are noted concerning for multifocal pneumonia. Small bilateral pleural effusions are noted. Bony thorax is unremarkable. IMPRESSION: Stable bilateral lung opacities are noted concerning for multifocal pneumonia. Small bilateral pleural effusions are noted. Aortic Atherosclerosis (ICD10-I70.0). Electronically Signed   By: Marijo Conception M.D.   On: 06/26/2020 12:01        Scheduled Meds: . sodium chloride   Intravenous Once  . sodium chloride   Intravenous Once  . aspirin  81 mg Oral Daily  . calcium acetate  667 mg Oral TID WC  . Chlorhexidine Gluconate Cloth  6 each Topical Daily  . fluticasone  2 spray Each Nare Daily  . heparin injection (subcutaneous)  5,000 Units Subcutaneous Q8H  . insulin aspart  0-5 Units Subcutaneous QHS  . insulin aspart  0-9 Units Subcutaneous TID WC  . mouth rinse  15 mL Mouth Rinse BID  . metoprolol succinate  25 mg Oral Daily  . mometasone-formoterol  2 puff Inhalation BID  . montelukast  10 mg Oral QHS  . pantoprazole  40 mg Oral Daily  . pentoxifylline  400 mg Oral TID WC  . sodium zirconium cyclosilicate  10 g Oral BID   Continuous Infusions:    LOS: 3 days    Time spent: 46min Domenic Polite, MD [facility]  06/26/2020, 1:39 PM

## 2020-06-26 NOTE — Significant Event (Addendum)
Rapid Response Event Note   Reason for Call :  Called d/t tachycardia-140s. RN getting EKG and awaiting page back from MD.   Initial Focused Assessment:  Pt laying in bed in no distress. Pt is alert to self but very confused, grabbing at things. Lungs clear t/o, diminished in bases. Skin warm and dry. HR-120-150s, BP-181/91, RR-22, SpO2-97% on .40 bipap.    Interventions:  EKG-Afib c RVR Trop 5mg  metoprolol IV given-through L F/A PIV-HR remains 120-150s, SBP-120s. New PIV started in R F/A-L F/A one looks infiltrated.   Plan of Care:  ??How much metoprolol pt actually received. PIV it was given through looks to be infiltrated. New PIV started. RN to repage MD for orders.  Initiate new MD orders. Continue to monitor pt. Call RRT if further assistance needed.   Event Summary:   MD Notified: MD notified PTA RRT and bedside RN to repage regarding HR.  Call [place] [phone] End Time:2009  Dillard Essex, RN

## 2020-06-26 NOTE — Progress Notes (Deleted)
Pharmacy Antibiotic Note  Ann Sellers is a 86 y.o. female admitted on 07/13/2020 with UTI and possible aspiration pneumonia.  Pharmacy has been consulted for cefepime dosing. This is day 4 of cefepime therapy. Pt is also receiving a 5 day course of flagyl per MD. WBC is WNL 7.1; pt is afebrile, and renal function has improved with Scr 3.47 today.   Plan: Continue present antibiotics-- Cefepime 1g IV q24hours Flagyl 500mg  IV q8hours Monitor renal function, clinical progress, and micro data.   Height: 5\' 4"  (162.6 cm) Weight: 95.7 kg (210 lb 15.7 oz) IBW/kg (Calculated) : 54.7  Temp (24hrs), Avg:98.8 F (37.1 C), Min:97.9 F (36.6 C), Max:100.1 F (37.8 C)  Recent Labs  Lab 07/12/2020 1411 06/24/20 0609 06/24/20 1259 06/25/20 0507 06/25/20 0801 06/26/20 0333  WBC 9.6 6.4 6.3 8.6  --  7.1  CREATININE 4.41* 4.86*  --  3.97*  --  3.47*  LATICACIDVEN 0.9  --   --  1.1 0.8  --     Estimated Creatinine Clearance: 11.4 mL/min (A) (by C-G formula based on SCr of 3.47 mg/dL (H)).    Allergies  Allergen Reactions  . Losartan Cough  . Snake Antivenin [Antivenin Crotalidae Polyvalent (Snake Antivenin)] Other (See Comments)    PARALYSIS    Antimicrobials this admission: 9/3 vancomycin x1   9/3 cefepime >>  9/3 metronidazole> 9/8   Microbiology results: 9/3 BCx: ng 3 days 9/3 UCx: klebsiella pneumo (sensitive to imipenem, nitro, zosyn) 9/3 COVID: negative  Wilson Singer, PharmD PGY1 Pharmacy Resident 06/26/2020 9:14 AM

## 2020-06-26 NOTE — Progress Notes (Signed)
Patient now back in sinus rhythm.

## 2020-06-26 NOTE — Progress Notes (Addendum)
   Called into the room for a fast HR by central telemetry. Upon arriving to pt room,pt Bipap tubing were disconnected and was in pt's left hand. Had immediately connected it back. O2 saturation also cord also was taken off but was quickly  connected. O2 saturation at 98 % on the Bipap   . HR noted to go as high as 214 on the monitor. BP 181/91 . Now currently in the 160's, EKG completed  And is in Afib RVR; Rapid Response in the room to assist, Incoming RN in the room . Pt also was combative and was swinging the call bell to the oncoming RN . Pt holding this RN's arms very tight. Dr Clearence Ped called and updated with orders for metoprolol and EKG.  Will monitor and update accordingly.

## 2020-06-27 LAB — BLOOD GAS, ARTERIAL
Acid-base deficit: 2.8 mmol/L — ABNORMAL HIGH (ref 0.0–2.0)
Bicarbonate: 23.6 mmol/L (ref 20.0–28.0)
FIO2: 28
O2 Saturation: 95.8 %
Patient temperature: 37.1
pCO2 arterial: 56.9 mmHg — ABNORMAL HIGH (ref 32.0–48.0)
pH, Arterial: 7.241 — ABNORMAL LOW (ref 7.350–7.450)
pO2, Arterial: 84.5 mmHg (ref 83.0–108.0)

## 2020-06-27 LAB — CBC
HCT: 31 % — ABNORMAL LOW (ref 36.0–46.0)
Hemoglobin: 9.6 g/dL — ABNORMAL LOW (ref 12.0–15.0)
MCH: 30.9 pg (ref 26.0–34.0)
MCHC: 31 g/dL (ref 30.0–36.0)
MCV: 99.7 fL (ref 80.0–100.0)
Platelets: 165 10*3/uL (ref 150–400)
RBC: 3.11 MIL/uL — ABNORMAL LOW (ref 3.87–5.11)
RDW: 15.8 % — ABNORMAL HIGH (ref 11.5–15.5)
WBC: 5.9 10*3/uL (ref 4.0–10.5)
nRBC: 0 % (ref 0.0–0.2)

## 2020-06-27 LAB — PROTEIN ELECTROPHORESIS, SERUM
A/G Ratio: 1.5 (ref 0.7–1.7)
Albumin ELP: 3 g/dL (ref 2.9–4.4)
Alpha-1-Globulin: 0.3 g/dL (ref 0.0–0.4)
Alpha-2-Globulin: 0.8 g/dL (ref 0.4–1.0)
Beta Globulin: 0.6 g/dL — ABNORMAL LOW (ref 0.7–1.3)
Gamma Globulin: 0.4 g/dL (ref 0.4–1.8)
Globulin, Total: 2 g/dL — ABNORMAL LOW (ref 2.2–3.9)
M-Spike, %: 0.1 g/dL — ABNORMAL HIGH
Total Protein ELP: 5 g/dL — ABNORMAL LOW (ref 6.0–8.5)

## 2020-06-27 LAB — BASIC METABOLIC PANEL
Anion gap: 14 (ref 5–15)
BUN: 80 mg/dL — ABNORMAL HIGH (ref 8–23)
CO2: 22 mmol/L (ref 22–32)
Calcium: 9.9 mg/dL (ref 8.9–10.3)
Chloride: 111 mmol/L (ref 98–111)
Creatinine, Ser: 3.15 mg/dL — ABNORMAL HIGH (ref 0.44–1.00)
GFR calc Af Amer: 14 mL/min — ABNORMAL LOW (ref >60)
GFR calc non Af Amer: 12 mL/min — ABNORMAL LOW (ref >60)
Glucose, Bld: 110 mg/dL — ABNORMAL HIGH (ref 70–99)
Potassium: 4.7 mmol/L (ref 3.5–5.1)
Sodium: 147 mmol/L — ABNORMAL HIGH (ref 135–145)

## 2020-06-27 LAB — GLUCOSE, CAPILLARY
Glucose-Capillary: 93 mg/dL (ref 70–99)
Glucose-Capillary: 95 mg/dL (ref 70–99)
Glucose-Capillary: 115 mg/dL — ABNORMAL HIGH (ref 70–99)
Glucose-Capillary: 120 mg/dL — ABNORMAL HIGH (ref 70–99)
Glucose-Capillary: 150 mg/dL — ABNORMAL HIGH (ref 70–99)

## 2020-06-27 LAB — AMMONIA: Ammonia: 31 umol/L (ref 9–35)

## 2020-06-27 MED ORDER — DEXTROSE-NACL 5-0.45 % IV SOLN: INTRAVENOUS | Status: DC

## 2020-06-27 MED ORDER — DEXTROSE-NACL 5-0.45 % IV SOLN: INTRAVENOUS | Status: AC

## 2020-06-27 MED ORDER — HYDRALAZINE HCL 20 MG/ML IJ SOLN
INTRAMUSCULAR | Status: AC
  Administered 2020-06-27: 10 mg via INTRAVENOUS
  Filled 2020-06-27: qty 1

## 2020-06-27 MED ORDER — HYDRALAZINE HCL 20 MG/ML IJ SOLN
10.0000 mg | Freq: Four times a day (QID) | INTRAMUSCULAR | Status: DC | PRN
  Administered 2020-06-27 – 2020-06-29 (×5): 10 mg via INTRAVENOUS
  Filled 2020-06-27 (×4): qty 1

## 2020-06-27 NOTE — Progress Notes (Signed)
After checking on pt and recording bipap info, pt woke up and started pulling bipap off and apart trying to get it off her face.  Pt combative and saying she will be quiet if she doesn't have to wear [place].  RT explained she needs [place] in order to keep her oxygen up and to keep her from have to go to the ICU.  Pt now on 6LPM [place] and tolerating ok.  RT will make RN aware.  RT will continue to monitor.

## 2020-06-27 NOTE — Progress Notes (Signed)
PROGRESS NOTE    Ann Sellers  IRW:[id number] DOB: [birth year] DOA: 07/19/2020 PCP: Reynold Bowen, MD  Brief Narrative: : Ann Sellers is a 89/F w/ COPD, chronic respiratory failure on 2 L home O2, chronic diastolic CHF, type 2 diabetes mellitus, essential hypertensio, moderate aortic stenosis, chronic kidney disease stage 4 was brought to the emergency room with sudden onset confusion and increase oxygen requirement.  Woke up 9/3 morning and was noted to be confused.  Her oxygen level was 83% on 2 L. EMS was called and patient was brought into the ED for further evaluation and management.  She has had occasional choking episodes in the past when she eats meats. -In the ED patient was somnolent but easily arousable to voices and confused.  Initial ABG showed no evidence of hypercarbia with pH of 7.4.   CT head ordered and pending.  Chest x-ray and CT chest completed with bilateral pulmonary infiltrates R>L and noted bilateral pleural effusion, also noted to have acute kidney injury with creatinine of 4.4 and severe hyperkalemia, Covid PCR was negative -Started on antibiotics for aspiration pneumonia, fluids for renal failure -Only mild improvement, complicated by intermittent encephalopathy from hypercarbia due to COPD, undiagnosed OSA and pneumonia -Antibiotics changed to meropenem 9/6 to cover ESBL Klebsiella in urine  Assessment & Plan:   Acute on chronic hypoxic and hypercarbic resp failure Aspiration pneumonia/multifocal -Chest x-ray and CT chest noted bilateral pneumonia, history suggestive of dysphagia, COVID-19 PCR is negative -Treated with IV cefepime and Flagyl,for 4days then given concomitant ESBL in urine was changed to meropenem 9/6 -SLP evaluation completed, felt to be moderate aspiration risk, regular diet and thin liquids recommended -9/6 had worsening lethargy and hypercarbic respiratory failure, required BiPAP for few hours during the day, unfortunately refused BiPAP last night,  appears slightly more lethargic again will repeat ABG in order BiPAP, also check ammonia -Continue duo nebs -Wean O2 as tolerated -Discussed need to consider comfort focused care with daughter should she not pull through,  Acute metabolic encephalopathy -Likely in the setting of pneumonia, renal failure and gabapentin with decreased clearance, also with component of hypercarbia -Repeat BiPAP today -Also check ammonia  Acute kidney injury on chronic kidney disease stage 4 Hyperkalemia -Baseline creatinine around 2.2 -Creatinine on admission was 4.4 with potassium of 6.5 -Renal ultrasound without hydronephrosis, etiology likely multifactorial in the setting of dehydration from infection, ongoing diuretic use, significantly elevated BUN raising concern for prerenal azotemia -Lasix on hold -Since she is lethargic with poor oral intake we will continue D5 half-normal saline for another 12 hours -Kidney function slowly improving, creatinine down to 3.1 today  ESBL Klebsiella UTI -With intermittent lethargy and confusion unable to assess if she is truly symptomatic from this -Now on meropenem day 2/5 which will also treat aspiration pneumonia  Iron deficiency anemia -patient denies any overt bleeding -Could be worsened by hemodilution -Anemia panel with iron deficiency as well, transfused 1 unit of PRBC 9/4 -Given IV iron x1  -Not appropriate for gastroenterology evaluation at this time  COPD/chronic respiratory failure -Stable, nebs as needed -Add incentive spirometry and antibiotics for aspiration pneumonia as noted above  Chronic diastolic CHF -Diuretics on hold in setting of renal failure, continue IV fluids today  Type 2 diabetes mellitus -Oral hypoglycemics on hold, continue sliding scale insulin  DVT prophylaxis: subcu heparin Code Status: DNR Family Communication: Updated daughter [name] Disposition Plan:  Status is: Inpatient  Remains inpatient appropriate  because:Inpatient level of care appropriate due to  severity of illness   Dispo:  Patient From: Home  Planned Disposition: Home with Health Care Svc  Expected discharge date: in 2-3days if kidney function improves  Medically stable for discharge: No   Consultants:     Procedures:   Antimicrobials:    Subjective: -Did not wear BiPAP last night, was quite agitated unfortunately -A little lethargic this morning but arousable  Objective: Vitals:   06/27/20 0549 06/27/20 0739 06/27/20 1148 06/27/20 1500  BP: (!) 164/73 (!) 147/65  (!) 170/80  Pulse: 93 84    Resp: 17 18  18   Temp: 98.7 F (37.1 C) 98.8 F (37.1 C) 98 F (36.7 C) (!) 97.5 F (36.4 C)  TempSrc: Axillary Axillary Oral Axillary  SpO2: 97% 96%    Weight: 93.9 kg     Height:        Intake/Output Summary (Last 24 hours) at 06/27/2020 1518 Last data filed at 06/27/2020 0259 Gross per 24 hour  Intake 560.44 ml  Output --  Net 560.44 ml   Filed Weights   06/25/20 2109 06/26/20 0500 06/27/20 0549  Weight: 95.7 kg 95.7 kg 93.9 kg    Examination:  General exam: Elderly chronically ill female, hard of hearing, somnolent, easily arousable, says a few words, and falls back to sleep, HEENT: Neck obese unable to assess JVD CVS: S1-S2, regular rate rhythm, systolic ejection murmur Lungs: Few scattered basilar rhonchi Abdomen: Soft, nontender, bowel sounds present Extremities: No edema  Psychiatry: Poor insight and judgment    Data Reviewed:   CBC: Recent Labs  Lab 07/15/2020 1411 07/10/2020 1537 06/24/20 0609 06/24/20 1259 06/25/20 0507 06/26/20 0333 06/27/20 0440  WBC 9.6   < > 6.4 6.3 8.6 7.1 5.9  NEUTROABS 8.4*  --  5.5  --  7.6 6.2  --   HGB 9.0*   < > 6.8* 6.9* 9.2* 10.3* 9.6*  HCT 32.1*   < > 22.9* 22.9* 30.0* 33.5* 31.0*  MCV 108.4*   < > 102.2* 100.9* 98.0 100.6* 99.7  PLT 150   < > 110* 113* 135* 143* 165   < > = values in this interval not displayed.   Basic Metabolic Panel: Recent Labs   Lab 06/24/2020 1411 07/05/2020 1411 07/08/2020 1537 06/24/20 0609 06/25/20 0507 06/26/20 0333 06/27/20 0440  NA 138   < > 135 139 139 143 147*  K 6.5*   < > 6.0* 4.6 5.5* 5.4* 4.7  CL 103  --   --  102 105 106 111  CO2 23  --   --  25 23 22 22   GLUCOSE 159*  --   --  197* 176* 120* 110*  BUN 90*  --   --  94* 86* 80* 80*  CREATININE 4.41*  --   --  4.86* 3.97* 3.47* 3.15*  CALCIUM 10.1  --   --  9.4 9.6 10.1 9.9   < > = values in this interval not displayed.   GFR: Estimated Creatinine Clearance: 12.4 mL/min (A) (by C-G formula based on SCr of 3.15 mg/dL (H)). Liver Function Tests: Recent Labs  Lab 06/22/2020 1411  AST 14*  ALT 8  ALKPHOS 62  BILITOT 0.5  PROT 6.2*  ALBUMIN 3.6   No results for input(s): LIPASE, AMYLASE in the last 168 hours. No results for input(s): AMMONIA in the last 168 hours. Coagulation Profile: Recent Labs  Lab 06/24/20 0609  INR 1.1   Cardiac Enzymes: Recent Labs  Lab 06/24/20 1259  CKTOTAL 43  BNP (last 3 results) Recent Labs    11/17/19 0918 11/23/19 1251 12/15/19 1117  PROBNP 1,533* 3,200* 2,498*   HbA1C: No results for input(s): HGBA1C in the last 72 hours. CBG: Recent Labs  Lab 06/26/20 2115 06/26/20 2311 06/27/20 0409 06/27/20 0736 06/27/20 1146  GLUCAP 106* 91 93 95 115*   Lipid Profile: No results for input(s): CHOL, HDL, LDLCALC, TRIG, CHOLHDL, LDLDIRECT in the last 72 hours. Thyroid Function Tests: No results for input(s): TSH, T4TOTAL, FREET4, T3FREE, THYROIDAB in the last 72 hours. Anemia Panel: No results for input(s): VITAMINB12, FOLATE, FERRITIN, TIBC, IRON, RETICCTPCT in the last 72 hours. Urine analysis:    Component Value Date/Time   COLORURINE YELLOW 07/07/2020 1411   APPEARANCEUR CLEAR 07/17/2020 1411   LABSPEC 1.015  1411   PHURINE 5.0 06/22/2020 1411   GLUCOSEU NEGATIVE 06/28/2020 1411   HGBUR NEGATIVE 06/22/2020 1411   BILIRUBINUR NEGATIVE 07/04/2020 1411   KETONESUR NEGATIVE  07/04/2020 1411   PROTEINUR 100 (A) 07/14/2020 1411   UROBILINOGEN 0.2 02/01/2014 1732   NITRITE NEGATIVE 07/10/2020 1411   LEUKOCYTESUR NEGATIVE 07/19/2020 1411   Sepsis Labs: @LABRCNTIP (procalcitonin:4,lacticidven:4)  ) Recent Results (from the past 240 hour(s))  Blood Culture (routine x 2)     Status: None (Preliminary result)   Collection Time: 07/11/2020  2:00 PM   Specimen: BLOOD  Result Value Ref Range Status   Specimen Description BLOOD LEFT ANTECUBITAL  Final   Special Requests   Final    BOTTLES DRAWN AEROBIC AND ANAEROBIC Blood Culture results may not be optimal due to an inadequate volume of blood received in culture bottles   Culture   Final    NO GROWTH 4 DAYS Performed at [facility], [place] [address] [place], [place] [address]    Report Status PENDING  Incomplete  SARS Coronavirus 2 by RT PCR (hospital order, performed in [facility] hospital lab) Nasopharyngeal Nasopharyngeal Swab     Status: None   Collection Time: 07/15/2020  2:11 PM   Specimen: Nasopharyngeal Swab  Result Value Ref Range Status   SARS Coronavirus 2 NEGATIVE NEGATIVE Final    Comment: (NOTE) SARS-CoV-2 target nucleic acids are NOT DETECTED.  The SARS-CoV-2 RNA is generally detectable in upper and lower respiratory specimens during the acute phase of infection. The lowest concentration of SARS-CoV-2 viral copies this assay can detect is 250 copies / mL. A negative result does not preclude SARS-CoV-2 infection and should not be used as the sole basis for treatment or other patient management decisions.  A negative result may occur with improper specimen collection / handling, submission of specimen other than nasopharyngeal swab, presence of viral mutation(s) within the areas targeted by this assay, and inadequate number of viral copies (<250 copies / mL). A negative result must be combined with clinical observations, patient history, and epidemiological information.  Fact Sheet for  Patients:   StrictlyIdeas.no  Fact Sheet for Healthcare Providers: BankingDealers.co.za  This test is not yet approved or  cleared by the [place] FDA and has been authorized for detection and/or diagnosis of SARS-CoV-2 by FDA under an Emergency Use Authorization (EUA).  This EUA will remain in effect (meaning this test can be used) for the duration of the COVID-19 declaration under Section 564(b)(1) of the Act, 21 U.S.C. section 360bbb-3(b)(1), unless the authorization is terminated or revoked sooner.  Performed at [facility], [place] [address], [place], [place] [address]   Urine culture     Status: Abnormal  Collection Time: 07/13/2020  2:20 PM   Specimen: In/Out Cath Urine  Result Value Ref Range Status   Specimen Description IN/OUT CATH URINE  Final   Special Requests   Final    NONE Performed at [facility], [address] [place], [place] [address]    Culture (A)  Final    >=100,000 COLONIES/mL KLEBSIELLA PNEUMONIAE Confirmed Extended Spectrum Beta-Lactamase Producer (ESBL).  In bloodstream infections from ESBL organisms, carbapenems are preferred over piperacillin/tazobactam. They are shown to have a lower risk of mortality.    Report Status 06/25/2020 FINAL  Final   Organism ID, Bacteria KLEBSIELLA PNEUMONIAE (A)  Final      Susceptibility   Klebsiella pneumoniae - MIC*    AMPICILLIN >=32 RESISTANT Resistant     CEFAZOLIN >=64 RESISTANT Resistant     CEFTRIAXONE >=64 RESISTANT Resistant     CIPROFLOXACIN >=4 RESISTANT Resistant     GENTAMICIN >=16 RESISTANT Resistant     IMIPENEM 0.5 SENSITIVE Sensitive     NITROFURANTOIN <=16 SENSITIVE Sensitive     TRIMETH/SULFA >=320 RESISTANT Resistant     AMPICILLIN/SULBACTAM >=32 RESISTANT Resistant     PIP/TAZO 16 SENSITIVE Sensitive     * >=100,000 COLONIES/mL KLEBSIELLA PNEUMONIAE  Blood Culture (routine x 2)     Status: None (Preliminary result)   Collection  Time: 07/19/2020  4:23 PM   Specimen: BLOOD  Result Value Ref Range Status   Specimen Description BLOOD BLOOD RIGHT FOREARM  Final   Special Requests   Final    BOTTLES DRAWN AEROBIC AND ANAEROBIC Blood Culture results may not be optimal due to an inadequate volume of blood received in culture bottles   Culture   Final    NO GROWTH 4 DAYS Performed at [facility], [address] [place], [place] [address]    Report Status PENDING  Incomplete         Radiology Studies: DG Chest Port 1 View  Result Date: 06/26/2020 CLINICAL DATA:  Hypoxia. EXAM: PORTABLE CHEST 1 VIEW COMPARISON:  June 23, 2020. FINDINGS: Stable cardiomediastinal silhouette. No pneumothorax is noted. Stable bilateral lung opacities are noted concerning for multifocal pneumonia. Small bilateral pleural effusions are noted. Bony thorax is unremarkable. IMPRESSION: Stable bilateral lung opacities are noted concerning for multifocal pneumonia. Small bilateral pleural effusions are noted. Aortic Atherosclerosis (ICD10-I70.0). Electronically Signed   By: Marijo Conception M.D.   On: 06/26/2020 12:01        Scheduled Meds: . aspirin  81 mg Oral Daily  . calcium acetate  667 mg Oral TID WC  . Chlorhexidine Gluconate Cloth  6 each Topical Daily  . fluticasone  2 spray Each Nare Daily  . heparin injection (subcutaneous)  5,000 Units Subcutaneous Q8H  . insulin aspart  0-5 Units Subcutaneous QHS  . insulin aspart  0-9 Units Subcutaneous TID WC  . mouth rinse  15 mL Mouth Rinse BID  . metoprolol succinate  25 mg Oral Daily  . metoprolol tartrate  5 mg Intravenous Once  . mometasone-formoterol  2 puff Inhalation BID  . montelukast  10 mg Oral QHS  . pantoprazole  40 mg Oral Daily  . pentoxifylline  400 mg Oral TID WC  . sodium zirconium cyclosilicate  10 g Oral BID   Continuous Infusions: . dextrose 5 % and 0.45% NaCl    . meropenem (MERREM) IV 500 mg (06/27/20 0838)     LOS: 4 days    Time spent:  1min Theoplis Garciagarcia  Broadus John, MD  [facility]  06/27/2020, 3:18 PM

## 2020-06-27 NOTE — Progress Notes (Addendum)
New order received for Hydralazine 10 mg IV . Given as ordered. Current BP 177/68. Oncoming RN updated .  Dr. Broadus John updated with BP range in the 150's to 180's.Currently at 181/55.No noted scheduled BP meds . Will continue to monitor and update MD accordingly.

## 2020-06-27 NOTE — Progress Notes (Signed)
Patient wearing BIPAP at this time. Patient is tolerating settings well. RT will continue to monitor as needed.

## 2020-06-27 NOTE — Progress Notes (Addendum)
   06/26/20 1908  Assess: MEWS Score  BP (!) 181/91  Pulse Rate (!) 155  ECG Heart Rate (!) 166  SpO2 97 %  Assess: MEWS Score  MEWS Temp 0  MEWS Systolic 0  MEWS Pulse 3  MEWS RR 1  MEWS LOC 0  MEWS Score 4  MEWS Score Color Red  Assess: if the MEWS score is Yellow or Red  Were vital signs taken at a resting state? Yes  Focused Assessment No change from prior assessment  Early Detection of Sepsis Score *See Row Information* High  MEWS guidelines implemented *See Row Information* Yes  Treat  MEWS Interventions Administered scheduled meds/treatments;Escalated (See documentation below)  Take Vital Signs  Increase Vital Sign Frequency  Red: Q 1hr X 4 then Q 4hr X 4, if remains red, continue Q 4hrs  Escalate  MEWS: Escalate Red: discuss with charge nurse/RN and provider, consider discussing with RRT  Notify: Charge Nurse/RN  Name of Charge Nurse/RN Notified Janett Billow RN   Date Charge Nurse/RN Notified 06/26/20  Time Charge Nurse/RN Notified 1910  Notify: Provider  Provider Name/Title Dr. Orlin Hilding  Date Provider Notified 06/26/20  Time Provider Notified 1916  Notification Type Page  Notification Reason Change in status  Response See new orders  Date of Provider Response 06/26/20  Time of Provider Response 1916  Notify: Rapid Response  Name of Rapid Response RN Notified Mindy   Date Rapid Response Notified 06/26/20  Time Rapid Response Notified 1904  Document  Patient Outcome Transferred/level of care increased;Stabilized after interventions  Progress note created (see row info) Yes  MEWS protocol initiated.

## 2020-06-27 NOTE — Progress Notes (Signed)
  PT Cancellation Note  Patient Details Name: Ann Sellers MRN: [id number] DOB: 1925/02/21   Cancelled Treatment:    Reason Eval/Treat Not Completed: (P) Medical issues which prohibited therapy Pt on BiPAP and RN request therapy be held today. PT will follow back for treatment tomorrow as appropriate.  Zennie Ayars B. Migdalia Dk PT, DPT Acute Rehabilitation Services Pager [phone] Office [phone]    [place] 06/27/2020, 3:49 PM

## 2020-06-27 NOTE — Progress Notes (Signed)
Report given to [place], Therapist, sports. Patient is alert and oriented x1 resting in bed with call light in reach. Bed alarm activated. Patient to work with PT today. 4L of O2 in place via nasal cannula after weaning down from BIPAP. Plan is to wean O2 as tolerated to get back to home setting of 2L. Q4 FSBS. SR on tele after A fib RVR overnight x2 hours. IV antibiotic in place for ESBL in urine and aspiration PNA. Hgb 9.6, platelets 165, K+ 4.7 and Creat 3.15 this AM. Decreased oral intake noted.  Daughter [name] updated overnight via telephone and will be here this AM. Plan is for patient to go home with son with home health when stable.

## 2020-06-28 LAB — CULTURE, BLOOD (ROUTINE X 2)
Culture: NO GROWTH
Culture: NO GROWTH

## 2020-06-28 LAB — CBC
HCT: 35.2 % — ABNORMAL LOW (ref 36.0–46.0)
Hemoglobin: 10.6 g/dL — ABNORMAL LOW (ref 12.0–15.0)
MCH: 30.6 pg (ref 26.0–34.0)
MCHC: 30.1 g/dL (ref 30.0–36.0)
MCV: 101.7 fL — ABNORMAL HIGH (ref 80.0–100.0)
Platelets: 221 10*3/uL (ref 150–400)
RBC: 3.46 MIL/uL — ABNORMAL LOW (ref 3.87–5.11)
RDW: 15.8 % — ABNORMAL HIGH (ref 11.5–15.5)
WBC: 6 10*3/uL (ref 4.0–10.5)
nRBC: 0.3 % — ABNORMAL HIGH (ref 0.0–0.2)

## 2020-06-28 LAB — BASIC METABOLIC PANEL
Anion gap: 10 (ref 5–15)
BUN: 72 mg/dL — ABNORMAL HIGH (ref 8–23)
CO2: 23 mmol/L (ref 22–32)
Calcium: 9.9 mg/dL (ref 8.9–10.3)
Chloride: 113 mmol/L — ABNORMAL HIGH (ref 98–111)
Creatinine, Ser: 2.63 mg/dL — ABNORMAL HIGH (ref 0.44–1.00)
GFR calc Af Amer: 17 mL/min — ABNORMAL LOW (ref >60)
GFR calc non Af Amer: 15 mL/min — ABNORMAL LOW (ref >60)
Glucose, Bld: 151 mg/dL — ABNORMAL HIGH (ref 70–99)
Potassium: 4.3 mmol/L (ref 3.5–5.1)
Sodium: 146 mmol/L — ABNORMAL HIGH (ref 135–145)

## 2020-06-28 LAB — GLUCOSE, CAPILLARY
Glucose-Capillary: 140 mg/dL — ABNORMAL HIGH (ref 70–99)
Glucose-Capillary: 143 mg/dL — ABNORMAL HIGH (ref 70–99)
Glucose-Capillary: 145 mg/dL — ABNORMAL HIGH (ref 70–99)
Glucose-Capillary: 153 mg/dL — ABNORMAL HIGH (ref 70–99)
Glucose-Capillary: 155 mg/dL — ABNORMAL HIGH (ref 70–99)
Glucose-Capillary: 175 mg/dL — ABNORMAL HIGH (ref 70–99)

## 2020-06-28 MED ORDER — DEXTROSE-NACL 5-0.45 % IV SOLN: INTRAVENOUS | Status: AC

## 2020-06-28 NOTE — Progress Notes (Signed)
Physical Therapy Treatment Patient Details Name: Ann Sellers MRN: [id number] DOB: 11-Mar-1925 Today's Date: 06/28/2020    History of Present Illness Ann Sellers is a 86 y.o. female with medical history significant for essential HTN, type 2 diabetes, chronic diastolic CHF, asthma, chronic hypoxia on 2L continuously who presented to [facility] ED from home due to sudden onset confusion and increase oxygen requirement.    PT Comments    Pt agreeable to working with therapy today. Pt is very hard of hearing and requires increased commands. Pt able to hear her daughter better. Once pt aware of goal of getting to EoB requires min A. Pt able to sit about 5 minutes EOB before she experiences increased work of breathing and lays herself back in the bed. Pt noticeably fatigued at end of session. D/c plans remain appropriate at this time. PT will continue to follow acutely.    Follow Up Recommendations  SNF     Equipment Recommendations  None recommended by PT (Seems pretty well-equipped)       Precautions / Restrictions Precautions Precautions: Fall Precaution Comments: monitor work of breathing Restrictions Weight Bearing Restrictions: No    Balance Overall balance assessment: Needs assistance Sitting-balance support: Feet supported Sitting balance-Leahy Scale: Poor Sitting balance - Comments: able to sit EoB about 5 minutes, able to progress from maxA to min guard and then modA when fatigued                                    Cognition Arousal/Alertness: Awake/alert Behavior During Therapy: WFL for tasks assessed/performed;Flat affect Overall Cognitive Status: Difficult to assess Area of Impairment: Orientation;Following commands;Awareness;Problem solving                 Orientation Level: Person     Following Commands: Follows one step commands with increased time;Follows one step commands inconsistently   Awareness: Emergent Problem Solving: Slow  processing;Difficulty sequencing;Requires verbal cues;Decreased initiation General Comments: Pt very HoH requiring lots of repeating cues, responds best to her daughter's voice         General Comments General comments (skin integrity, edema, etc.): Daughter in room during session, assists in giving commands as pt responds better. max HR noted 124 bpm, Pt on 3L O2 via [place] and despite 3/4 DoE SaO2 >90%O2       Pertinent Vitals/Pain Pain Assessment: No/denies pain           PT Goals (current goals can now be found in the care plan section) Acute Rehab PT Goals Patient Stated Goal: did not state PT Goal Formulation: With patient Time For Goal Achievement: 07/09/20 Potential to Achieve Goals: Fair Progress towards PT goals: Progressing toward goals    Frequency    Min 3X/week      PT Plan Current plan remains appropriate       AM-PAC PT "6 Clicks" Mobility   Outcome Measure  Help needed turning from your back to your side while in a flat bed without using bedrails?: A Little Help needed moving from lying on your back to sitting on the side of a flat bed without using bedrails?: A Lot Help needed moving to and from a bed to a chair (including a wheelchair)?: Total Help needed standing up from a chair using your arms (e.g., wheelchair or bedside chair)?: Total Help needed to walk in hospital room?: Total Help needed climbing 3-5 steps with a railing? : Total  6 Click Score: 9    End of Session Equipment Utilized During Treatment: Oxygen Activity Tolerance: Patient tolerated treatment well Patient left: with call bell/phone within reach;with family/visitor present;in bed;with bed alarm set;with nursing/sitter in room Nurse Communication: Mobility status PT Visit Diagnosis: Muscle weakness (generalized) (M62.81);Other abnormalities of gait and mobility (R26.89)     Time: 1536-1550 PT Time Calculation (min) (ACUTE ONLY): 14 min  Charges:  $Therapeutic Activity: 8-22  mins                     Melvine Julin B. Migdalia Dk PT, DPT Acute Rehabilitation Services Pager [phone] Office [phone]    [place] 06/28/2020, 5:10 PM

## 2020-06-28 NOTE — Progress Notes (Signed)
PROGRESS NOTE    Ann Sellers  YTK:[id number] DOB: 1925-02-06 DOA: 06/21/2020 PCP: Reynold Bowen, MD     Brief Narrative:  Ann Sellers is a 86 yo female with past medical history of COPD, chronic respiratory failure on 2 L home O2, chronic diastolic CHF, type 2 diabetes mellitus, essential hypertension, moderate aortic stenosis, chronic kidney disease stage 4 who was brought to the emergency room with sudden onset confusionand increase oxygen requirement.Woke up 9/3 morning and was noted to be confused. Her oxygen level was 83% on 2 L.EMS was called and patient was brought into the ED for further evaluation andmanagement. She has had occasional choking episodes in the past when she eats meats. In the ED, patient was somnolent but easily arousable to voicesand confused.  InitialABG showed no evidence of hypercarbia with pH of 7.4.Chest x-ray and CT chest completed with bilateral pulmonary infiltrates R>[place] notedbilateralpleural effusion, also noted to have acute kidney injury with creatinine of 4.4 and severe hyperkalemia, Covid PCR was negative. She was started on antibiotics for aspiration pneumonia as well as IV fluids for renal failure. Hospitalization complicated by intermittent encephalopathy from hypercarbia due to COPD, undiagnosed OSA and pneumonia. Antibiotics changed to meropenem 9/6 to cover ESBL Klebsiella in urine.  New events last 24 hours / Subjective: Patient seen with daughter Ann Sellers at bedside. Patient tolerated BiPAP overnight. She is currently on 3 L nasal cannula O2 satting high 90s. She has no acute complaints, however had mentioned to her daughter earlier that she could not catch her breath. Hopeful to be able to remain off of BiPAP today, eat, work with PT.  Assessment & Plan:   Active Problems:   CAP (community acquired pneumonia)   Acute on chronic hypoxic and hypercarbic resp failure Aspiration pneumonia/multifocal -Chest x-ray and CT chest noted  bilateral pneumonia, history suggestive of dysphagia, COVID-19 PCR is negative -Treated with IV cefepime and Flagyl for 4days then given concomitant ESBL in urine was changed to meropenem 9/6 -SLP evaluation completed, felt to be moderate aspiration risk, regular diet and thin liquids recommended -9/6 had worsening lethargy and hypercarbic respiratory failure, required BiPAP for few hours during the day -9/8 tolerated BiPAP overnight. Slightly improved today, will see how she does off BiPAP today. Will need BiPAP tonight while sleeping   Acute metabolic encephalopathy -Likely in the setting of pneumonia, renal failure and gabapentin with decreased clearance, also with component of hypercarbia -Improved   Acute kidney injury on chronic kidney disease stage 4 Hyperkalemia -Baseline creatinine around 2.2 -Creatinine on admission was 4.4 with potassium of 6.5 -Renal ultrasound without hydronephrosis, etiology likely multifactorial in the setting of dehydration from infection, ongoing diuretic use, significantly elevated BUN raising concern for prerenal azotemia -Lasix on hold -Improving   ESBL Klebsiella UTI -With intermittent lethargy and confusion unable to assess if she is truly symptomatic from this -Now on meropenem  Iron deficiency anemia -Anemia panel with iron deficiency as well, transfused 1 unit of PRBC 9/4 -Given IV iron x1  -Not appropriate for gastroenterology evaluation at this time -Hgb stable   COPD/chronic respiratory failure -Stable, nebs as needed -Add incentive spirometry and antibiotics for aspiration pneumonia as noted above  Chronic diastolic CHF -Diuretics on hold in setting of renal failure, continue IV fluids today -Toprol   Type 2 diabetes mellitus -Oral hypoglycemics on hold, continue sliding scale insulin   DVT prophylaxis:  heparin injection 5,000 Units Start: 06/25/20 0900  Code Status: DNR Family Communication: Daughter at  bedside Disposition  Plan:   Status is: Inpatient  Remains inpatient appropriate because:Inpatient level of care appropriate due to severity of illness   Dispo:  Patient From: Home  Planned Disposition: Home with Health Care Svc  Expected discharge date: 07/03/20  Medically stable for discharge: No    Antimicrobials:  Anti-infectives (From admission, onward)   Start     Dose/Rate Route Frequency Ordered Stop   06/26/20 1700  meropenem (MERREM) 500 mg in sodium chloride 0.9 % 100 mL IVPB        500 mg 200 mL/hr over 30 Minutes Intravenous Every 12 hours 06/26/20 1557     06/25/20 1600  vancomycin (VANCOCIN) IVPB 1000 mg/200 mL premix  Status:  Discontinued        1,000 mg 200 mL/hr over 60 Minutes Intravenous Every 48 hours 07/18/2020 1624 07/11/2020 1838   06/25/20 1200  metroNIDAZOLE (FLAGYL) IVPB 500 mg  Status:  Discontinued        500 mg 100 mL/hr over 60 Minutes Intravenous Every 8 hours 06/25/20 0410 06/26/20 1338   06/24/20 1600  ceFEPIme (MAXIPIME) 1 g in sodium chloride 0.9 % 100 mL IVPB  Status:  Discontinued        1 g 200 mL/hr over 30 Minutes Intravenous Every 24 hours 06/28/2020 1624 07/05/2020 1838   06/24/20 1600  ceFEPIme (MAXIPIME) 1 g in sodium chloride 0.9 % 100 mL IVPB  Status:  Discontinued        1 g 200 mL/hr over 30 Minutes Intravenous Every 24 hours  1838 06/26/20 1338   06/24/20 0000  metroNIDAZOLE (FLAGYL) IVPB 500 mg  Status:  Discontinued        500 mg 100 mL/hr over 60 Minutes Intravenous Every 8 hours 07/03/2020 1838 06/25/20 0410   06/22/2020 1415  ceFEPIme (MAXIPIME) 2 g in sodium chloride 0.9 % 100 mL IVPB        2 g 200 mL/hr over 30 Minutes Intravenous  Once 07/14/2020 1406 07/14/2020 1621   06/29/2020 1415  metroNIDAZOLE (FLAGYL) IVPB 500 mg        500 mg 100 mL/hr over 60 Minutes Intravenous  Once 07/13/2020 1406 06/29/2020 1712   07/18/2020 1415  vancomycin (VANCOCIN) IVPB 1000 mg/200 mL premix        1,000 mg 200 mL/hr over 60 Minutes Intravenous   Once 07/19/2020 1406 06/28/2020 1753        Objective: Vitals:   06/28/20 0300 06/28/20 0454 06/28/20 0808 06/28/20 1138  BP: (!) 184/84 (!) 110/44 (!) 110/44 (!) 173/75  Pulse: (!) 104 91 (!) 105 (!) 104  Resp: (!) 27 (!) 29 (!) 31 (!) 26  Temp:  97.8 F (36.6 C)  98.9 F (37.2 C)  TempSrc:  Axillary  Oral  SpO2: 98% 98% 100% 97%  Weight:  92.5 kg    Height:        Intake/Output Summary (Last 24 hours) at 06/28/2020 1243 Last data filed at 06/28/2020 0655 Gross per 24 hour  Intake 665.09 ml  Output 800 ml  Net -134.91 ml   Filed Weights   06/26/20 0500 06/27/20 0549 06/28/20 0454  Weight: 95.7 kg 93.9 kg 92.5 kg    Examination:  General exam: Appears calm and comfortable  Respiratory system: Clear to auscultation. Respiratory effort normal. No respiratory distress. No conversational dyspnea. On 3L [place] O2  Cardiovascular system: S1 & S2 heard, tachycardic, regular rhythm. No murmurs. No pedal edema. Gastrointestinal system: Abdomen is nondistended, soft and nontender. Normal bowel sounds heard.  Central nervous system: Alert and oriented. No focal neurological deficits. Speech clear.  Extremities: Symmetric in appearance  Skin: No rashes, lesions or ulcers on exposed skin  Psychiatry: Judgement and insight appear normal. Mood & affect appropriate.   Data Reviewed: I have personally reviewed following labs and imaging studies  CBC: Recent Labs  Lab 07/18/2020 1411 06/29/2020 1537 06/24/20 0609 06/24/20 0609 06/24/20 1259 06/25/20 0507 06/26/20 0333 06/27/20 0440 06/28/20 0620  WBC 9.6   < > 6.4   < > 6.3 8.6 7.1 5.9 6.0  NEUTROABS 8.4*  --  5.5  --   --  7.6 6.2  --   --   HGB 9.0*   < > 6.8*   < > 6.9* 9.2* 10.3* 9.6* 10.6*  HCT 32.1*   < > 22.9*   < > 22.9* 30.0* 33.5* 31.0* 35.2*  MCV 108.4*   < > 102.2*   < > 100.9* 98.0 100.6* 99.7 101.7*  PLT 150   < > 110*   < > 113* 135* 143* 165 221   < > = values in this interval not displayed.   Basic Metabolic  Panel: Recent Labs  Lab 06/24/20 0609 06/25/20 0507 06/26/20 0333 06/27/20 0440 06/28/20 0620  NA 139 139 143 147* 146*  K 4.6 5.5* 5.4* 4.7 4.3  CL 102 105 106 111 113*  CO2 25 23 22 22 23   GLUCOSE 197* 176* 120* 110* 151*  BUN 94* 86* 80* 80* 72*  CREATININE 4.86* 3.97* 3.47* 3.15* 2.63*  CALCIUM 9.4 9.6 10.1 9.9 9.9   GFR: Estimated Creatinine Clearance: 14.7 mL/min (A) (by C-G formula based on SCr of 2.63 mg/dL (H)). Liver Function Tests: Recent Labs  Lab 06/22/2020 1411  AST 14*  ALT 8  ALKPHOS 62  BILITOT 0.5  PROT 6.2*  ALBUMIN 3.6   No results for input(s): LIPASE, AMYLASE in the last 168 hours. Recent Labs  Lab 06/27/20 1553  AMMONIA 31   Coagulation Profile: Recent Labs  Lab 06/24/20 0609  INR 1.1   Cardiac Enzymes: Recent Labs  Lab 06/24/20 1259  CKTOTAL 43   BNP (last 3 results) Recent Labs    11/17/19 0918 11/23/19 1251 12/15/19 1117  PROBNP 1,533* 3,200* 2,498*   HbA1C: No results for input(s): HGBA1C in the last 72 hours. CBG: Recent Labs  Lab 06/27/20 2104 06/28/20 0001 06/28/20 0451 06/28/20 0754 06/28/20 1140  GLUCAP 150* 145* 153* 143* 140*   Lipid Profile: No results for input(s): CHOL, HDL, LDLCALC, TRIG, CHOLHDL, LDLDIRECT in the last 72 hours. Thyroid Function Tests: No results for input(s): TSH, T4TOTAL, FREET4, T3FREE, THYROIDAB in the last 72 hours. Anemia Panel: No results for input(s): VITAMINB12, FOLATE, FERRITIN, TIBC, IRON, RETICCTPCT in the last 72 hours. Sepsis Labs: Recent Labs  Lab 07/15/2020 1411 06/25/20 0507 06/25/20 0801  LATICACIDVEN 0.9 1.1 0.8    Recent Results (from the past 240 hour(s))  Blood Culture (routine x 2)     Status: None   Collection Time: 06/22/2020  2:00 PM   Specimen: BLOOD  Result Value Ref Range Status   Specimen Description BLOOD LEFT ANTECUBITAL  Final   Special Requests   Final    BOTTLES DRAWN AEROBIC AND ANAEROBIC Blood Culture results may not be optimal due to an  inadequate volume of blood received in culture bottles   Culture   Final    NO GROWTH 5 DAYS Performed at [facility], [place] [address], [place], [place] [address]  Report Status 06/28/2020 FINAL  Final  SARS Coronavirus 2 by RT PCR (hospital order, performed in [facility] hospital lab) Nasopharyngeal Nasopharyngeal Swab     Status: None   Collection Time: 07/05/2020  2:11 PM   Specimen: Nasopharyngeal Swab  Result Value Ref Range Status   SARS Coronavirus 2 NEGATIVE NEGATIVE Final    Comment: (NOTE) SARS-CoV-2 target nucleic acids are NOT DETECTED.  The SARS-CoV-2 RNA is generally detectable in upper and lower respiratory specimens during the acute phase of infection. The lowest concentration of SARS-CoV-2 viral copies this assay can detect is 250 copies / mL. A negative result does not preclude SARS-CoV-2 infection and should not be used as the sole basis for treatment or other patient management decisions.  A negative result may occur with improper specimen collection / handling, submission of specimen other than nasopharyngeal swab, presence of viral mutation(s) within the areas targeted by this assay, and inadequate number of viral copies (<250 copies / mL). A negative result must be combined with clinical observations, patient history, and epidemiological information.  Fact Sheet for Patients:   StrictlyIdeas.no  Fact Sheet for Healthcare Providers: BankingDealers.co.za  This test is not yet approved or  cleared by the [place] FDA and has been authorized for detection and/or diagnosis of SARS-CoV-2 by FDA under an Emergency Use Authorization (EUA).  This EUA will remain in effect (meaning this test can be used) for the duration of the COVID-19 declaration under Section 564(b)(1) of the Act, 21 U.S.C. section 360bbb-3(b)(1), unless the authorization is terminated or revoked sooner.  Performed at [facility], [place] [address], [place], [place] [address]   Urine culture     Status: Abnormal   Collection Time: 07/14/2020  2:20 PM   Specimen: In/Out Cath Urine  Result Value Ref Range Status   Specimen Description IN/OUT CATH URINE  Final   Special Requests   Final    NONE Performed at [facility], [place] [address], [place], [place] [address]    Culture (A)  Final    >=100,000 COLONIES/mL KLEBSIELLA PNEUMONIAE Confirmed Extended Spectrum Beta-Lactamase Producer (ESBL).  In bloodstream infections from ESBL organisms, carbapenems are preferred over piperacillin/tazobactam. They are shown to have a lower risk of mortality.    Report Status 06/25/2020 FINAL  Final   Organism ID, Bacteria KLEBSIELLA PNEUMONIAE (A)  Final      Susceptibility   Klebsiella pneumoniae - MIC*    AMPICILLIN >=32 RESISTANT Resistant     CEFAZOLIN >=64 RESISTANT Resistant     CEFTRIAXONE >=64 RESISTANT Resistant     CIPROFLOXACIN >=4 RESISTANT Resistant     GENTAMICIN >=16 RESISTANT Resistant     IMIPENEM 0.5 SENSITIVE Sensitive     NITROFURANTOIN <=16 SENSITIVE Sensitive     TRIMETH/SULFA >=320 RESISTANT Resistant     AMPICILLIN/SULBACTAM >=32 RESISTANT Resistant     PIP/TAZO 16 SENSITIVE Sensitive     * >=100,000 COLONIES/mL KLEBSIELLA PNEUMONIAE  Blood Culture (routine x 2)     Status: None   Collection Time: 07/13/2020  4:23 PM   Specimen: BLOOD  Result Value Ref Range Status   Specimen Description BLOOD BLOOD RIGHT FOREARM  Final   Special Requests   Final    BOTTLES DRAWN AEROBIC AND ANAEROBIC Blood Culture results may not be optimal due to an inadequate volume of blood received in culture bottles   Culture   Final    NO GROWTH 5 DAYS Performed at [facility], [address] [place], [place] [address]    Report Status 06/28/2020 FINAL  Final      Radiology Studies: No results found.    Scheduled Meds: . aspirin  81 mg Oral Daily  . calcium acetate  667 mg Oral TID WC  . Chlorhexidine  Gluconate Cloth  6 each Topical Daily  . fluticasone  2 spray Each Nare Daily  . heparin injection (subcutaneous)  5,000 Units Subcutaneous Q8H  . insulin aspart  0-5 Units Subcutaneous QHS  . insulin aspart  0-9 Units Subcutaneous TID WC  . mouth rinse  15 mL Mouth Rinse BID  . metoprolol succinate  25 mg Oral Daily  . metoprolol tartrate  5 mg Intravenous Once  . mometasone-formoterol  2 puff Inhalation BID  . montelukast  10 mg Oral QHS  . pantoprazole  40 mg Oral Daily  . pentoxifylline  400 mg Oral TID WC  . sodium zirconium cyclosilicate  10 g Oral BID   Continuous Infusions: . meropenem (MERREM) IV Stopped (06/27/20 2132)     LOS: 5 days      Time spent: 35 minutes   Dessa Phi, DO [facility] 06/28/2020, 12:43 PM   Available via Epic secure chat 7am-7pm After these hours, please refer to coverage provider listed on [email or web address]

## 2020-06-28 NOTE — Progress Notes (Signed)
Report given to [place], Therapist, sports. Patient is alert and oriented x1 resting in bed with call light in reach. Bed alarm activated. If able, patient to work with PT today. BIPAP on continuously overnight at 30% FiO2. Recent ABG shows respiratory acidosis. IV antibiotic in place for UTI and aspiration PNA. BP improved with PRN IV Hydralazine. SR/ST on tele. Q4 FSBS. Daughter [name] updated via telephone overnight. Plan is for patient to go home with son with home health when stable. Family declines SNF at this time. Possible palliative care consult pending. Hgb 10.6, platelets 221, K+ 4.3, Creat 2.63 this AM.

## 2020-06-28 NOTE — Progress Notes (Signed)
Called to bedside by RN to assess pt. Pt was SOB after working with PT. Duoneb was given and attempted to place pt on BIPAP. Pt is refusing BIPAP at this time. Pt appeared more comfortable after Duoneb and repositioning. RN aware.

## 2020-06-29 LAB — GLUCOSE, CAPILLARY
Glucose-Capillary: 132 mg/dL — ABNORMAL HIGH (ref 70–99)
Glucose-Capillary: 139 mg/dL — ABNORMAL HIGH (ref 70–99)
Glucose-Capillary: 143 mg/dL — ABNORMAL HIGH (ref 70–99)
Glucose-Capillary: 163 mg/dL — ABNORMAL HIGH (ref 70–99)

## 2020-06-29 LAB — BLOOD GAS, ARTERIAL
Acid-base deficit: 1.7 mmol/L (ref 0.0–2.0)
Bicarbonate: 23.5 mmol/L (ref 20.0–28.0)
Drawn by: 51155
FIO2: 28
O2 Saturation: 92.7 %
Patient temperature: 35.8
pCO2 arterial: 44.7 mmHg (ref 32.0–48.0)
pH, Arterial: 7.333 — ABNORMAL LOW (ref 7.350–7.450)
pO2, Arterial: 59.5 mmHg — ABNORMAL LOW (ref 83.0–108.0)

## 2020-06-29 LAB — BASIC METABOLIC PANEL
Anion gap: 10 (ref 5–15)
Anion gap: 10 (ref 5–15)
BUN: 66 mg/dL — ABNORMAL HIGH (ref 8–23)
BUN: 66 mg/dL — ABNORMAL HIGH (ref 8–23)
CO2: 25 mmol/L (ref 22–32)
CO2: 24 mmol/L (ref 22–32)
Calcium: 9.7 mg/dL (ref 8.9–10.3)
Calcium: 9.5 mg/dL (ref 8.9–10.3)
Chloride: 115 mmol/L — ABNORMAL HIGH (ref 98–111)
Chloride: 113 mmol/L — ABNORMAL HIGH (ref 98–111)
Creatinine, Ser: 2.36 mg/dL — ABNORMAL HIGH (ref 0.44–1.00)
Creatinine, Ser: 2.27 mg/dL — ABNORMAL HIGH (ref 0.44–1.00)
GFR calc Af Amer: 20 mL/min — ABNORMAL LOW (ref >60)
GFR calc Af Amer: 21 mL/min — ABNORMAL LOW (ref >60)
GFR calc non Af Amer: 17 mL/min — ABNORMAL LOW (ref >60)
GFR calc non Af Amer: 18 mL/min — ABNORMAL LOW (ref >60)
Glucose, Bld: 138 mg/dL — ABNORMAL HIGH (ref 70–99)
Glucose, Bld: 176 mg/dL — ABNORMAL HIGH (ref 70–99)
Potassium: 4.6 mmol/L (ref 3.5–5.1)
Potassium: 4.7 mmol/L (ref 3.5–5.1)
Sodium: 150 mmol/L — ABNORMAL HIGH (ref 135–145)
Sodium: 147 mmol/L — ABNORMAL HIGH (ref 135–145)

## 2020-06-29 LAB — MAGNESIUM: Magnesium: 1.8 mg/dL (ref 1.7–2.4)

## 2020-06-29 MED ORDER — DEXTROSE-NACL 5-0.45 % IV SOLN: INTRAVENOUS | Status: DC

## 2020-06-29 NOTE — Progress Notes (Signed)
   06/29/20 0455  Assess: MEWS Score  Temp (!) 97.4 F (36.3 C)  BP (!) 163/69  Pulse Rate (!) 110  ECG Heart Rate (!) 111  Resp (!) 24  Level of Consciousness Responds to Voice  SpO2 96 %  O2 Device Nasal Cannula  Patient Activity (if Appropriate) In bed  O2 Flow Rate (L/min) 2 L/min  Assess: MEWS Score  MEWS Temp 0  MEWS Systolic 0  MEWS Pulse 2  MEWS RR 1  MEWS LOC 1  MEWS Score 4  MEWS Score Color Red  Assess: if the MEWS score is Yellow or Red  Were vital signs taken at a resting state? Yes  Focused Assessment Change from prior assessment (see assessment flowsheet)  Early Detection of Sepsis Score *See Row Information* High  MEWS guidelines implemented *See Row Information* Yes  Treat  MEWS Interventions Other (Comment) (MD entering orders and guidelines implemented)  Take Vital Signs  Increase Vital Sign Frequency  Red: Q 1hr X 4 then Q 4hr X 4, if remains red, continue Q 4hrs  Escalate  MEWS: Escalate Red: discuss with charge nurse/RN and provider, consider discussing with RRT  Notify: Charge Nurse/RN  Name of Charge Nurse/RN Notified [place], RN  Date Charge Nurse/RN Notified 06/29/20  Time Charge Nurse/RN Notified 0500  Notify: Provider  Provider Name/Title G. Shalhoub, MD  Date Provider Notified 06/29/20  Time Provider Notified [phone]  Notification Type Page  Notification Reason Change in status  Response See new orders  Date of Provider Response 06/29/20  Time of Provider Response 0530  Document  Patient Outcome Other (Comment) (guidelines in place, continuing to monitor)  Progress note created (see row info) Yes

## 2020-06-29 NOTE — Progress Notes (Signed)
Report given to [place], Therapist, sports. Patient is drowsy though oriented x1 resting in bed. Increased drowsiness/lethargy noted this AM and MD made aware. ABG completed and pH improving and CO2 WNL. Patient did not wear BIPAP overnight, 2L of O2 in place per patient's home setting and MD on call.  Patient currently more alert and responsive and tolerating 2L well. Daughter [name] to be called back for update. BMP results pending. SR/ST on tele, ACHS. IV antibiotic in place. Decreased oral intake noted. Son will be visiting patient today. Plan is for home with home health, no SNF at this time. Likely DC date will be Monday.

## 2020-06-29 NOTE — Progress Notes (Signed)
Pt is more  lethargic but arousable. V/S stable. Had 5 runs of NSVT earlier. Dr Cyd Silence informed. Will check BMP & Mg as ordered. Will continue to monitor pt.

## 2020-06-29 NOTE — Progress Notes (Signed)
Pt has been lethargic this am. Dr. Maylene Roes asked me to try to put pt on Bipap. When I attempted to put bipap on pt woke up and became very aggressive grabbing my arm. She grabbed the bipap mask out of my hand, ripped it apart and told me "NO". From that point she was arousable and trying to converse. Dr. Maylene Roes made aware. Carroll Kinds RN

## 2020-06-29 NOTE — Progress Notes (Signed)
PROGRESS NOTE    Ann Sellers  KNL:[id number] DOB: 12-01-1924 DOA: 07/14/2020 PCP: Reynold Bowen, MD     Brief Narrative:  Ann Sellers is a 86 yo female with past medical history of COPD, chronic respiratory failure on 2 L home O2, chronic diastolic CHF, type 2 diabetes mellitus, essential hypertension, moderate aortic stenosis, chronic kidney disease stage 4 who was brought to the emergency room with sudden onset confusionand increase oxygen requirement.Woke up 9/3 morning and was noted to be confused. Her oxygen level was 83% on 2 L.EMS was called and patient was brought into the ED for further evaluation andmanagement. She has had occasional choking episodes in the past when she eats meats. In the ED, patient was somnolent but easily arousable to voicesand confused.  InitialABG showed no evidence of hypercarbia with pH of 7.4.Chest x-ray and CT chest completed with bilateral pulmonary infiltrates R>Land notedbilateralpleural effusion, also noted to have acute kidney injury with creatinine of 4.4 and severe hyperkalemia, Covid PCR was negative. She was started on antibiotics for aspiration pneumonia as well as IV fluids for renal failure. Hospitalization complicated by intermittent encephalopathy from hypercarbia due to COPD, undiagnosed OSA and pneumonia. Antibiotics changed to meropenem 9/6 to cover ESBL Klebsiella in urine.  New events last 24 hours / Subjective: During my initial assessment in the morning, patient was somnolent, unarousable.  She fluttered her eyelids but did not awake enough to answer any questions.  Overnight, due to somnolence, ABG was obtained which revealed improvement in her hypercarbia.  I discussed with the bedside RN to trial BiPAP.  Apparently, patient became combative and did not want the BiPAP mask on.  I reassessed patient with son Ann Sellers at bedside.  Patient was alert, awake, conversant.  She appeared to be comfortable on 2 L nasal cannula O2.  I  think that having family member at bedside improved her alertness.  I discussed with daughter Ann Sellers over speaker phone on today's updates.  I also discussed with family my recommendation for palliative care consultation.  Their goal is to take her home instead of SNF placement.  I do think they will benefit from palliative care conversation and outpatient follow-up as well.  Assessment & Plan:   Active Problems:   CAP (community acquired pneumonia)   Acute on chronic hypoxic and hypercarbic resp failure Aspiration pneumonia/multifocal -Chest x-ray and CT chest noted bilateral pneumonia, history suggestive of dysphagia, COVID-19 PCR is negative -Treated with IV cefepime and Flagyl for 4days then given concomitant ESBL in urine was changed to meropenem 9/6 -SLP evaluation completed, felt to be moderate aspiration risk, regular diet and thin liquids recommended -9/6 had worsening lethargy and hypercarbic respiratory failure, required BiPAP for few hours during the day -9/8 tolerated BiPAP overnight. Slightly improved today, will see how she does off BiPAP today. Will need BiPAP tonight while sleeping  -9/9 has refused BiPAP. Repeat ABG improved.   Acute metabolic encephalopathy -Likely in the setting of pneumonia, renal failure and gabapentin with decreased clearance, also with component of hypercarbia -Improved   Acute kidney injury on chronic kidney disease stage 4 -Baseline creatinine around 2.2 -Creatinine on admission was 4.4 with potassium of 6.5 -Renal ultrasound without hydronephrosis, etiology likely multifactorial in the setting of dehydration from infection, ongoing diuretic use, significantly elevated BUN raising concern for prerenal azotemia -Lasix on hold -Improving   Hypernatremia -D5-0.45NS IVF  -Trend BMP   ESBL Klebsiella UTI -With intermittent lethargy and confusion unable to assess if she is truly  symptomatic from this -Now on meropenem  Iron deficiency  anemia -Anemia panel with iron deficiency as well, transfused 1 unit of PRBC 9/4 -Given IV iron x1  -Not appropriate for gastroenterology evaluation at this time -Hgb stable   COPD/chronic respiratory failure -Stable, nebs as needed -Add incentive spirometry and antibiotics for aspiration pneumonia as noted above  Chronic diastolic CHF -Diuretics on hold in setting of renal failure, continue IV fluids today -Toprol   Type 2 diabetes mellitus -Oral hypoglycemics on hold, continue sliding scale insulin   DVT prophylaxis:  heparin injection 5,000 Units Start: 06/25/20 0900  Code Status: DNR Family Communication: Son at bedside, discussed with daughter over the phone as well  Disposition Plan:   Status is: Inpatient  Remains inpatient appropriate because:Inpatient level of care appropriate due to severity of illness   Dispo:  Patient From: Home  Planned Disposition: Home with Health Care Svc  Expected discharge date: 07/03/20  Medically stable for discharge: No. Restarted IV fluid due to hypernatremia.  Patient has recurrent somnolent episodes, sometimes requiring BiPAP.  Palliative care medicine consulted today for goals of care.    Antimicrobials:  Anti-infectives (From admission, onward)   Start     Dose/Rate Route Frequency Ordered Stop   06/26/20 1700  meropenem (MERREM) 500 mg in sodium chloride 0.9 % 100 mL IVPB        500 mg 200 mL/hr over 30 Minutes Intravenous Every 12 hours 06/26/20 1557     06/25/20 1600  vancomycin (VANCOCIN) IVPB 1000 mg/200 mL premix  Status:  Discontinued        1,000 mg 200 mL/hr over 60 Minutes Intravenous Every 48 hours 07/08/2020 1624 06/22/2020 1838   06/25/20 1200  metroNIDAZOLE (FLAGYL) IVPB 500 mg  Status:  Discontinued        500 mg 100 mL/hr over 60 Minutes Intravenous Every 8 hours 06/25/20 0410 06/26/20 1338   06/24/20 1600  ceFEPIme (MAXIPIME) 1 g in sodium chloride 0.9 % 100 mL IVPB  Status:  Discontinued        1 g 200  mL/hr over 30 Minutes Intravenous Every 24 hours 06/22/2020 1624 07/13/2020 1838   06/24/20 1600  ceFEPIme (MAXIPIME) 1 g in sodium chloride 0.9 % 100 mL IVPB  Status:  Discontinued        1 g 200 mL/hr over 30 Minutes Intravenous Every 24 hours 06/22/2020 1838 06/26/20 1338   06/24/20 0000  metroNIDAZOLE (FLAGYL) IVPB 500 mg  Status:  Discontinued        500 mg 100 mL/hr over 60 Minutes Intravenous Every 8 hours 07/14/2020 1838 06/25/20 0410   07/17/2020 1415  ceFEPIme (MAXIPIME) 2 g in sodium chloride 0.9 % 100 mL IVPB        2 g 200 mL/hr over 30 Minutes Intravenous  Once 07/11/2020 1406 07/03/2020 1621   07/19/2020 1415  metroNIDAZOLE (FLAGYL) IVPB 500 mg        500 mg 100 mL/hr over 60 Minutes Intravenous  Once 07/17/2020 1406 06/21/2020 1712   06/21/2020 1415  vancomycin (VANCOCIN) IVPB 1000 mg/200 mL premix        1,000 mg 200 mL/hr over 60 Minutes Intravenous  Once 06/26/2020 1406 07/17/2020 1753       Objective: Vitals:   06/29/20 0555 06/29/20 0650 06/29/20 0700 06/29/20 1145  BP: (!) 165/68 (!) 173/76 (!) 165/69 (!) 166/78  Pulse: (!) 111 (!) 106 (!) 112 100  Resp: (!) 24 (!) 23 (!) 21 20  Temp: Marland Kitchen)  96.4 F (35.8 C) 99 F (37.2 C) 97.7 F (36.5 C) 98.4 F (36.9 C)  TempSrc: Axillary Axillary Axillary Axillary  SpO2: 95% 98% 99% 97%  Weight:      Height:        Intake/Output Summary (Last 24 hours) at 06/29/2020 1218 Last data filed at 06/29/2020 0100 Gross per 24 hour  Intake 642.22 ml  Output 400 ml  Net 242.22 ml   Filed Weights   06/26/20 0500 06/27/20 0549 06/28/20 0454  Weight: 95.7 kg 93.9 kg 92.5 kg    Examination: General exam: Appears calm and comfortable  Respiratory system: Clear to auscultation. Respiratory effort normal.  On 2 L nasal cannula O2 Cardiovascular system: S1 & S2 heard, tachycardic, regular rhythm, rate 110s. No pedal edema. Gastrointestinal system: Abdomen is nondistended, soft and nontender. Normal bowel sounds heard. Central nervous system: Alert and  oriented. Non focal exam. Speech clear  Extremities: Symmetric in appearance bilaterally  Skin: No rashes, lesions or ulcers on exposed skin  Psychiatry: Mood & affect appropriate.   Data Reviewed: I have personally reviewed following labs and imaging studies  CBC: Recent Labs  Lab 07/20/2020 1411 06/27/2020 1537 06/24/20 0609 06/24/20 0609 06/24/20 1259 06/25/20 0507 06/26/20 0333 06/27/20 0440 06/28/20 0620  WBC 9.6   < > 6.4   < > 6.3 8.6 7.1 5.9 6.0  NEUTROABS 8.4*  --  5.5  --   --  7.6 6.2  --   --   HGB 9.0*   < > 6.8*   < > 6.9* 9.2* 10.3* 9.6* 10.6*  HCT 32.1*   < > 22.9*   < > 22.9* 30.0* 33.5* 31.0* 35.2*  MCV 108.4*   < > 102.2*   < > 100.9* 98.0 100.6* 99.7 101.7*  PLT 150   < > 110*   < > 113* 135* 143* 165 221   < > = values in this interval not displayed.   Basic Metabolic Panel: Recent Labs  Lab 06/25/20 0507 06/26/20 0333 06/27/20 0440 06/28/20 0620 06/29/20 0809  NA 139 143 147* 146* 150*  K 5.5* 5.4* 4.7 4.3 4.6  CL 105 106 111 113* 115*  CO2 23 22 22 23 25   GLUCOSE 176* 120* 110* 151* 138*  BUN 86* 80* 80* 72* 66*  CREATININE 3.97* 3.47* 3.15* 2.63* 2.36*  CALCIUM 9.6 10.1 9.9 9.9 9.7   GFR: Estimated Creatinine Clearance: 16.4 mL/min (A) (by C-G formula based on SCr of 2.36 mg/dL (H)). Liver Function Tests: Recent Labs  Lab 07/11/2020 1411  AST 14*  ALT 8  ALKPHOS 62  BILITOT 0.5  PROT 6.2*  ALBUMIN 3.6   No results for input(s): LIPASE, AMYLASE in the last 168 hours. Recent Labs  Lab 06/27/20 1553  AMMONIA 31   Coagulation Profile: Recent Labs  Lab 06/24/20 0609  INR 1.1   Cardiac Enzymes: Recent Labs  Lab 06/24/20 1259  CKTOTAL 43   BNP (last 3 results) Recent Labs    11/17/19 0918 11/23/19 1251 12/15/19 1117  PROBNP 1,533* 3,200* 2,498*   HbA1C: No results for input(s): HGBA1C in the last 72 hours. CBG: Recent Labs  Lab 06/28/20 1140 06/28/20 1639 06/28/20 2003 06/29/20 0819 06/29/20 1143  GLUCAP 140* 175*  155* 132* 139*   Lipid Profile: No results for input(s): CHOL, HDL, LDLCALC, TRIG, CHOLHDL, LDLDIRECT in the last 72 hours. Thyroid Function Tests: No results for input(s): TSH, T4TOTAL, FREET4, T3FREE, THYROIDAB in the last 72 hours. Anemia Panel: No results  for input(s): VITAMINB12, FOLATE, FERRITIN, TIBC, IRON, RETICCTPCT in the last 72 hours. Sepsis Labs: Recent Labs  Lab 06/21/2020 1411 06/25/20 0507 06/25/20 0801  LATICACIDVEN 0.9 1.1 0.8    Recent Results (from the past 240 hour(s))  Blood Culture (routine x 2)     Status: None   Collection Time:   2:00 PM   Specimen: BLOOD  Result Value Ref Range Status   Specimen Description BLOOD LEFT ANTECUBITAL  Final   Special Requests   Final    BOTTLES DRAWN AEROBIC AND ANAEROBIC Blood Culture results may not be optimal due to an inadequate volume of blood received in culture bottles   Culture   Final    NO GROWTH 5 DAYS Performed at [facility], [place] [address] [place], [place] [address]    Report Status 06/28/2020 FINAL  Final  SARS Coronavirus 2 by RT PCR (hospital order, performed in [facility] hospital lab) Nasopharyngeal Nasopharyngeal Swab     Status: None   Collection Time: 06/21/2020  2:11 PM   Specimen: Nasopharyngeal Swab  Result Value Ref Range Status   SARS Coronavirus 2 NEGATIVE NEGATIVE Final    Comment: (NOTE) SARS-CoV-2 target nucleic acids are NOT DETECTED.  The SARS-CoV-2 RNA is generally detectable in upper and lower respiratory specimens during the acute phase of infection. The lowest concentration of SARS-CoV-2 viral copies this assay can detect is 250 copies / mL. A negative result does not preclude SARS-CoV-2 infection and should not be used as the sole basis for treatment or other patient management decisions.  A negative result may occur with improper specimen collection / handling, submission of specimen other than nasopharyngeal swab, presence of viral mutation(s) within the areas  targeted by this assay, and inadequate number of viral copies (<250 copies / mL). A negative result must be combined with clinical observations, patient history, and epidemiological information.  Fact Sheet for Patients:   StrictlyIdeas.no  Fact Sheet for Healthcare Providers: BankingDealers.co.za  This test is not yet approved or  cleared by the [place] FDA and has been authorized for detection and/or diagnosis of SARS-CoV-2 by FDA under an Emergency Use Authorization (EUA).  This EUA will remain in effect (meaning this test can be used) for the duration of the COVID-19 declaration under Section 564(b)(1) of the Act, 21 U.S.C. section 360bbb-3(b)(1), unless the authorization is terminated or revoked sooner.  Performed at [facility], [place] [address] [place], [place] [address]   Urine culture     Status: Abnormal   Collection Time: 07/14/2020  2:20 PM   Specimen: In/Out Cath Urine  Result Value Ref Range Status   Specimen Description IN/OUT CATH URINE  Final   Special Requests   Final    NONE Performed at [facility], [place] [address], [place], [place] [address]    Culture (A)  Final    >=100,000 COLONIES/mL KLEBSIELLA PNEUMONIAE Confirmed Extended Spectrum Beta-Lactamase Producer (ESBL).  In bloodstream infections from ESBL organisms, carbapenems are preferred over piperacillin/tazobactam. They are shown to have a lower risk of mortality.    Report Status 06/25/2020 FINAL  Final   Organism ID, Bacteria KLEBSIELLA PNEUMONIAE (A)  Final      Susceptibility   Klebsiella pneumoniae - MIC*    AMPICILLIN >=32 RESISTANT Resistant     CEFAZOLIN >=64 RESISTANT Resistant     CEFTRIAXONE >=64 RESISTANT Resistant     CIPROFLOXACIN >=4 RESISTANT Resistant     GENTAMICIN >=16 RESISTANT Resistant  IMIPENEM 0.5 SENSITIVE Sensitive     NITROFURANTOIN <=16 SENSITIVE Sensitive     TRIMETH/SULFA >=320 RESISTANT Resistant      AMPICILLIN/SULBACTAM >=32 RESISTANT Resistant     PIP/TAZO 16 SENSITIVE Sensitive     * >=100,000 COLONIES/mL KLEBSIELLA PNEUMONIAE  Blood Culture (routine x 2)     Status: None   Collection Time: 07/06/2020  4:23 PM   Specimen: BLOOD  Result Value Ref Range Status   Specimen Description BLOOD BLOOD RIGHT FOREARM  Final   Special Requests   Final    BOTTLES DRAWN AEROBIC AND ANAEROBIC Blood Culture results may not be optimal due to an inadequate volume of blood received in culture bottles   Culture   Final    NO GROWTH 5 DAYS Performed at [facility], [place] [address] [place], [place] [address]    Report Status 06/28/2020 FINAL  Final      Radiology Studies: No results found.    Scheduled Meds: . aspirin  81 mg Oral Daily  . calcium acetate  667 mg Oral TID WC  . Chlorhexidine Gluconate Cloth  6 each Topical Daily  . fluticasone  2 spray Each Nare Daily  . heparin injection (subcutaneous)  5,000 Units Subcutaneous Q8H  . insulin aspart  0-5 Units Subcutaneous QHS  . insulin aspart  0-9 Units Subcutaneous TID WC  . mouth rinse  15 mL Mouth Rinse BID  . metoprolol succinate  25 mg Oral Daily  . mometasone-formoterol  2 puff Inhalation BID  . montelukast  10 mg Oral QHS  . pantoprazole  40 mg Oral Daily  . pentoxifylline  400 mg Oral TID WC   Continuous Infusions: . dextrose 5 % and 0.45% NaCl    . meropenem (MERREM) IV Stopped (06/29/20 0016)     LOS: 6 days      Time spent: 35 minutes   Dessa Phi, DO [facility] 06/29/2020, 12:18 PM   Available via Epic secure chat 7am-7pm After these hours, please refer to coverage provider listed on [email or web address]

## 2020-06-29 NOTE — Significant Event (Signed)
HOSPITAL MEDICINE OVERNIGHT EVENT NOTE    Notified by nursing patient continues to be extremely lethargic with very little responsiveness to verbal stimuli however she does respond to pain.  I obtained an ABG on this patient this morning showing no evidence of worsening hypercapnia.  Patient is currently hemodynamically stable and is protecting her airway.  Chart reviewed.  Unfortunately, overall prognosis is rather poor and patient would benefit from goals of care / palliative care discussion prior to disposition.  Nursing also notes 5 beat run of Vtach. Will get repeat chemistry/magnesium to evaluate K/Mg and also ensure current rate of D5 1/2NS is correcting her hypovolemic hypernatremia.  Vernelle Emerald  MD [facility]

## 2020-06-29 NOTE — Progress Notes (Signed)
BIPAP standby. Pt is resting well. She would not want to wear the BIPAP at this moment.

## 2020-06-29 NOTE — Progress Notes (Signed)
ABG drawn and sent to the lab.  Lab called and made aware

## 2020-06-30 DIAGNOSIS — Z515 Encounter for palliative care: Secondary | ICD-10-CM

## 2020-06-30 DIAGNOSIS — Z66 Do not resuscitate: Secondary | ICD-10-CM

## 2020-06-30 DIAGNOSIS — Z7189 Other specified counseling: Secondary | ICD-10-CM

## 2020-06-30 LAB — BASIC METABOLIC PANEL
Anion gap: 11 (ref 5–15)
BUN: 65 mg/dL — ABNORMAL HIGH (ref 8–23)
CO2: 24 mmol/L (ref 22–32)
Calcium: 9.3 mg/dL (ref 8.9–10.3)
Chloride: 114 mmol/L — ABNORMAL HIGH (ref 98–111)
Creatinine, Ser: 2.32 mg/dL — ABNORMAL HIGH (ref 0.44–1.00)
GFR calc Af Amer: 20 mL/min — ABNORMAL LOW (ref >60)
GFR calc non Af Amer: 18 mL/min — ABNORMAL LOW (ref >60)
Glucose, Bld: 142 mg/dL — ABNORMAL HIGH (ref 70–99)
Potassium: 5 mmol/L (ref 3.5–5.1)
Sodium: 149 mmol/L — ABNORMAL HIGH (ref 135–145)

## 2020-06-30 LAB — GLUCOSE, CAPILLARY: Glucose-Capillary: 147 mg/dL — ABNORMAL HIGH (ref 70–99)

## 2020-06-30 MED ORDER — LORAZEPAM 2 MG/ML IJ SOLN
0.5000 mg | INTRAMUSCULAR | Status: DC | PRN
  Administered 2020-06-30: 1 mg via INTRAVENOUS
  Filled 2020-06-30: qty 1

## 2020-06-30 MED ORDER — HYDRALAZINE HCL 20 MG/ML IJ SOLN: 10.0000 mg | Freq: Four times a day (QID) | INTRAMUSCULAR | Status: DC | PRN

## 2020-06-30 MED ORDER — ONDANSETRON 4 MG PO TBDP
4.0000 mg | ORAL_TABLET | Freq: Four times a day (QID) | ORAL | Status: DC | PRN
  Filled 2020-06-30: qty 1

## 2020-06-30 MED ORDER — METOPROLOL SUCCINATE ER 50 MG PO TB24: 50.0000 mg | ORAL_TABLET | Freq: Every day | ORAL | Status: DC

## 2020-06-30 MED ORDER — BIOTENE DRY MOUTH MT LIQD: 15.0000 mL | OROMUCOSAL | Status: DC | PRN

## 2020-06-30 MED ORDER — GLYCOPYRROLATE 0.2 MG/ML IJ SOLN: 0.4000 mg | INTRAMUSCULAR | Status: DC | PRN

## 2020-06-30 MED ORDER — DEXTROSE 5 % IV SOLN: INTRAVENOUS | Status: DC

## 2020-06-30 MED ORDER — GLYCOPYRROLATE 1 MG PO TABS
1.0000 mg | ORAL_TABLET | ORAL | Status: DC | PRN
  Filled 2020-06-30: qty 1

## 2020-06-30 MED ORDER — FENTANYL BOLUS VIA INFUSION
25.0000 ug | INTRAVENOUS | Status: DC | PRN
  Filled 2020-06-30: qty 50

## 2020-06-30 MED ORDER — ACETAMINOPHEN 325 MG PO TABS: 650.0000 mg | ORAL_TABLET | Freq: Four times a day (QID) | ORAL | Status: DC | PRN

## 2020-06-30 MED ORDER — ACETAMINOPHEN 650 MG RE SUPP: 650.0000 mg | Freq: Four times a day (QID) | RECTAL | Status: DC | PRN

## 2020-06-30 MED ORDER — LORAZEPAM 2 MG/ML PO CONC: 0.5000 mg | ORAL | Status: DC | PRN

## 2020-06-30 MED ORDER — ONDANSETRON HCL 4 MG/2ML IJ SOLN: 4.0000 mg | Freq: Four times a day (QID) | INTRAMUSCULAR | Status: DC | PRN

## 2020-06-30 MED ORDER — POLYVINYL ALCOHOL 1.4 % OP SOLN
1.0000 [drp] | Freq: Four times a day (QID) | OPHTHALMIC | Status: DC | PRN
  Filled 2020-06-30: qty 15

## 2020-06-30 MED ORDER — FENTANYL 2500MCG IN NS 250ML (10MCG/ML) PREMIX INFUSION
0.0000 ug/h | INTRAVENOUS | Status: DC
  Administered 2020-06-30: 20 ug/h via INTRAVENOUS
  Filled 2020-06-30: qty 250

## 2020-06-30 MED ORDER — LORAZEPAM 1 MG PO TABS: 1.0000 mg | ORAL_TABLET | ORAL | Status: DC | PRN

## 2020-06-30 MED ORDER — BISACODYL 10 MG RE SUPP: 10.0000 mg | Freq: Every day | RECTAL | Status: DC | PRN

## 2020-07-01 DIAGNOSIS — Z1612 Extended spectrum beta lactamase (ESBL) resistance: Secondary | ICD-10-CM

## 2020-07-01 DIAGNOSIS — A498 Other bacterial infections of unspecified site: Secondary | ICD-10-CM

## 2020-07-01 DIAGNOSIS — D509 Iron deficiency anemia, unspecified: Secondary | ICD-10-CM

## 2020-07-01 DIAGNOSIS — J449 Chronic obstructive pulmonary disease, unspecified: Secondary | ICD-10-CM

## 2020-07-01 DIAGNOSIS — E870 Hyperosmolality and hypernatremia: Secondary | ICD-10-CM

## 2020-07-01 NOTE — Progress Notes (Signed)
Wasted 236 cc of Fentanyl (10 mcg/ml) infusion in the Stericycle. Witnessed by Cephus Richer, RN.

## 2020-07-21 NOTE — Progress Notes (Signed)
PROGRESS NOTE    Ann Sellers  HUD:[id number] DOB: 1925-07-11 DOA: 07/19/2020 PCP: Reynold Bowen, MD     Brief Narrative:  Ann Sellers is a 86 yo female with past medical history of COPD, chronic respiratory failure on 2 L home O2, chronic diastolic CHF, type 2 diabetes mellitus, essential hypertension, moderate aortic stenosis, chronic kidney disease stage 4 who was brought to the emergency room with sudden onset confusionand increase oxygen requirement.Woke up 9/3 morning and was noted to be confused. Her oxygen level was 83% on 2 L.EMS was called and patient was brought into the ED for further evaluation andmanagement. She has had occasional choking episodes in the past when she eats meats. In the ED, patient was somnolent but easily arousable to voicesand confused.  InitialABG showed no evidence of hypercarbia with pH of 7.4.Chest x-ray and CT chest completed with bilateral pulmonary infiltrates R>Land notedbilateralpleural effusion, also noted to have acute kidney injury with creatinine of 4.4 and severe hyperkalemia, Covid PCR was negative. She was started on antibiotics for aspiration pneumonia as well as IV fluids for renal failure. Hospitalization complicated by intermittent encephalopathy from hypercarbia due to COPD, undiagnosed OSA and pneumonia. Antibiotics changed to meropenem 9/6 to cover ESBL Klebsiella in urine.  New events last 24 hours / Subjective: Overnight, patient had extreme lethargy with little responsiveness.  Required to be placed on nonrebreather due to desaturation.  Also with 5 beats of V. tach.  This morning, on examination, she remains lethargic and somnolent. She does flutter her eyes with verbal stimuli.  Assessment & Plan:   Active Problems:   CAP (community acquired pneumonia)   Acute on chronic hypoxic and hypercarbic resp failure Aspiration pneumonia/multifocal -Chest x-ray and CT chest noted bilateral pneumonia, history suggestive of  dysphagia, COVID-19 PCR is negative -Treated with IV cefepime and Flagyl for 4days then given concomitant ESBL in urine was changed to meropenem 9/6 -SLP evaluation completed, felt to be moderate aspiration risk, regular diet and thin liquids recommended -9/6 had worsening lethargy and hypercarbic respiratory failure, required BiPAP for few hours during the day -9/8 tolerated BiPAP overnight. Slightly improved today, will see how she does off BiPAP today. Will need BiPAP tonight while sleeping  -9/9 has refused BiPAP. Repeat ABG improved.  -9/10 placed on nonrebreather due to desaturations overnight.  Overall poor prognosis due to continued to decline over the past several days  Acute metabolic encephalopathy -Likely in the setting of pneumonia, renal failure and gabapentin with decreased clearance, also with component of hypercarbia -Continue to monitor  Acute kidney injury on chronic kidney disease stage 4 -Baseline creatinine around 2.2 -Creatinine on admission was 4.4 with potassium of 6.5 -Renal ultrasound without hydronephrosis, etiology likely multifactorial in the setting of dehydration from infection, ongoing diuretic use, significantly elevated BUN raising concern for prerenal azotemia -Lasix on hold -Stable  Hypernatremia -Sodium remains 149.  Change IV fluid to D5. Trend BMP   ESBL Klebsiella UTI -With intermittent lethargy and confusion unable to assess if she is truly symptomatic from this -Now on meropenem  Iron deficiency anemia -Anemia panel with iron deficiency as well, transfused 1 unit of PRBC 9/4 -Given IV iron x1  -Not appropriate for gastroenterology evaluation at this time -Hgb stable   COPD/chronic respiratory failure -Stable, nebs as needed  Chronic diastolic CHF -Diuretics on hold in setting of renal failure, continue IV fluids today -Toprol   Type 2 diabetes mellitus -Oral hypoglycemics on hold, continue sliding scale insulin   DVT  prophylaxis:  heparin injection 5,000 Units Start: 06/25/20 0900  Code Status: DNR Family Communication: Discussed with daughter over the phone this morning Disposition Plan:   Status is: Inpatient  Remains inpatient appropriate because:Inpatient level of care appropriate due to severity of illness   Dispo:  Patient From: Home  Planned Disposition: Home with Health Care Svc vs hospice   Expected discharge date: 07/03/20  Medically stable for discharge: No.  Remains on IV fluid and nonrebreather today.  Awaiting palliative care consultation.    Antimicrobials:  Anti-infectives (From admission, onward)   Start     Dose/Rate Route Frequency Ordered Stop   06/26/20 1700  meropenem (MERREM) 500 mg in sodium chloride 0.9 % 100 mL IVPB        500 mg 200 mL/hr over 30 Minutes Intravenous Every 12 hours 06/26/20 1557 07/03/20 2159   06/25/20 1600  vancomycin (VANCOCIN) IVPB 1000 mg/200 mL premix  Status:  Discontinued        1,000 mg 200 mL/hr over 60 Minutes Intravenous Every 48 hours 06/29/2020 1624 06/29/2020 1838   06/25/20 1200  metroNIDAZOLE (FLAGYL) IVPB 500 mg  Status:  Discontinued        500 mg 100 mL/hr over 60 Minutes Intravenous Every 8 hours 06/25/20 0410 06/26/20 1338   06/24/20 1600  ceFEPIme (MAXIPIME) 1 g in sodium chloride 0.9 % 100 mL IVPB  Status:  Discontinued        1 g 200 mL/hr over 30 Minutes Intravenous Every 24 hours 07/10/2020 1624 06/22/2020 1838   06/24/20 1600  ceFEPIme (MAXIPIME) 1 g in sodium chloride 0.9 % 100 mL IVPB  Status:  Discontinued        1 g 200 mL/hr over 30 Minutes Intravenous Every 24 hours 07/07/2020 1838 06/26/20 1338   06/24/20 0000  metroNIDAZOLE (FLAGYL) IVPB 500 mg  Status:  Discontinued        500 mg 100 mL/hr over 60 Minutes Intravenous Every 8 hours 07/13/2020 1838 06/25/20 0410   07/20/2020 1415  ceFEPIme (MAXIPIME) 2 g in sodium chloride 0.9 % 100 mL IVPB        2 g 200 mL/hr over 30 Minutes Intravenous  Once 07/20/2020 1406 06/29/2020 1621     06/29/2020 1415  metroNIDAZOLE (FLAGYL) IVPB 500 mg        500 mg 100 mL/hr over 60 Minutes Intravenous  Once 07/10/2020 1406 06/29/2020 1712   07/15/2020 1415  vancomycin (VANCOCIN) IVPB 1000 mg/200 mL premix        1,000 mg 200 mL/hr over 60 Minutes Intravenous  Once 07/02/2020 1406 07/19/2020 1753       Objective: Vitals:   July 21, 2020 0343 21-Jul-2020 0446 21-Jul-2020 0825 2020-07-21 0842  BP:  (!) 163/81 (!) 148/108   Pulse: (!) 43 (!) 107 (!) 107   Resp:   (!) 30 (!) 24  Temp:  97.6 F (36.4 C) 98.2 F (36.8 C)   TempSrc:  Oral Oral   SpO2: 95% 100% 100%   Weight:  92.5 kg    Height:        Intake/Output Summary (Last 24 hours) at 07/21/20 1239 Last data filed at 2020-07-21 0500 Gross per 24 hour  Intake 1000 ml  Output 950 ml  Net 50 ml   Filed Weights   06/27/20 0549 06/28/20 0454 21-Jul-2020 0446  Weight: 93.9 kg 92.5 kg 92.5 kg    Examination: General exam: Appears somnolent Respiratory system: Clear to auscultation anteriorly, remains on nonrebreather, tachypneic Cardiovascular system: S1 &  S2 heard, tachycardic, regular rhythm. No pedal edema. Gastrointestinal system: Abdomen is nondistended, soft and nontender. Normal bowel sounds heard. Central nervous system: Alert to voice but does not remain awake or answers questions Extremities: Symmetric in appearance bilaterally  Skin: No rashes, lesions or ulcers on exposed skin   Data Reviewed: I have personally reviewed following labs and imaging studies  CBC: Recent Labs  Lab 07/07/2020 1411 06/26/2020 1537 06/24/20 0609 06/24/20 0609 06/24/20 1259 06/25/20 0507 06/26/20 0333 06/27/20 0440 06/28/20 0620  WBC 9.6   < > 6.4   < > 6.3 8.6 7.1 5.9 6.0  NEUTROABS 8.4*  --  5.5  --   --  7.6 6.2  --   --   HGB 9.0*   < > 6.8*   < > 6.9* 9.2* 10.3* 9.6* 10.6*  HCT 32.1*   < > 22.9*   < > 22.9* 30.0* 33.5* 31.0* 35.2*  MCV 108.4*   < > 102.2*   < > 100.9* 98.0 100.6* 99.7 101.7*  PLT 150   < > 110*   < > 113* 135* 143* 165 221    < > = values in this interval not displayed.   Basic Metabolic Panel: Recent Labs  Lab 06/27/20 0440 06/28/20 0620 06/29/20 0809 06/29/20 2240 07/27/20 0434  NA 147* 146* 150* 147* 149*  K 4.7 4.3 4.6 4.7 5.0  CL 111 113* 115* 113* 114*  CO2 22 23 25 24 24   GLUCOSE 110* 151* 138* 176* 142*  BUN 80* 72* 66* 66* 65*  CREATININE 3.15* 2.63* 2.36* 2.27* 2.32*  CALCIUM 9.9 9.9 9.7 9.5 9.3  MG  --   --   --  1.8  --    GFR: Estimated Creatinine Clearance: 16.7 mL/min (A) (by C-G formula based on SCr of 2.32 mg/dL (H)). Liver Function Tests: Recent Labs  Lab 07/19/2020 1411  AST 14*  ALT 8  ALKPHOS 62  BILITOT 0.5  PROT 6.2*  ALBUMIN 3.6   No results for input(s): LIPASE, AMYLASE in the last 168 hours. Recent Labs  Lab 06/27/20 1553  AMMONIA 31   Coagulation Profile: Recent Labs  Lab 06/24/20 0609  INR 1.1   Cardiac Enzymes: Recent Labs  Lab 06/24/20 1259  CKTOTAL 43   BNP (last 3 results) Recent Labs    11/17/19 0918 11/23/19 1251 12/15/19 1117  PROBNP 1,533* 3,200* 2,498*   HbA1C: No results for input(s): HGBA1C in the last 72 hours. CBG: Recent Labs  Lab 06/29/20 0819 06/29/20 1143 06/29/20 1749 06/29/20 2135 07-27-2020 0825  GLUCAP 132* 139* 143* 163* 147*   Lipid Profile: No results for input(s): CHOL, HDL, LDLCALC, TRIG, CHOLHDL, LDLDIRECT in the last 72 hours. Thyroid Function Tests: No results for input(s): TSH, T4TOTAL, FREET4, T3FREE, THYROIDAB in the last 72 hours. Anemia Panel: No results for input(s): VITAMINB12, FOLATE, FERRITIN, TIBC, IRON, RETICCTPCT in the last 72 hours. Sepsis Labs: Recent Labs  Lab 07/17/2020 1411 06/25/20 0507 06/25/20 0801  LATICACIDVEN 0.9 1.1 0.8    Recent Results (from the past 240 hour(s))  Blood Culture (routine x 2)     Status: None   Collection Time: 07/12/2020  2:00 PM   Specimen: BLOOD  Result Value Ref Range Status   Specimen Description BLOOD LEFT ANTECUBITAL  Final   Special Requests    Final    BOTTLES DRAWN AEROBIC AND ANAEROBIC Blood Culture results may not be optimal due to an inadequate volume of blood received in culture bottles  Culture   Final    NO GROWTH 5 DAYS Performed at [facility], [place] [address], [place], [place] [address]    Report Status 06/28/2020 FINAL  Final  SARS Coronavirus 2 by RT PCR (hospital order, performed in [facility] hospital lab) Nasopharyngeal Nasopharyngeal Swab     Status: None   Collection Time: 07/04/2020  2:11 PM   Specimen: Nasopharyngeal Swab  Result Value Ref Range Status   SARS Coronavirus 2 NEGATIVE NEGATIVE Final    Comment: (NOTE) SARS-CoV-2 target nucleic acids are NOT DETECTED.  The SARS-CoV-2 RNA is generally detectable in upper and lower respiratory specimens during the acute phase of infection. The lowest concentration of SARS-CoV-2 viral copies this assay can detect is 250 copies / mL. A negative result does not preclude SARS-CoV-2 infection and should not be used as the sole basis for treatment or other patient management decisions.  A negative result may occur with improper specimen collection / handling, submission of specimen other than nasopharyngeal swab, presence of viral mutation(s) within the areas targeted by this assay, and inadequate number of viral copies (<250 copies / mL). A negative result must be combined with clinical observations, patient history, and epidemiological information.  Fact Sheet for Patients:   StrictlyIdeas.no  Fact Sheet for Healthcare Providers: BankingDealers.co.za  This test is not yet approved or  cleared by the [place] FDA and has been authorized for detection and/or diagnosis of SARS-CoV-2 by FDA under an Emergency Use Authorization (EUA).  This EUA will remain in effect (meaning this test can be used) for the duration of the COVID-19 declaration under Section 564(b)(1) of the Act, 21 U.S.C. section  360bbb-3(b)(1), unless the authorization is terminated or revoked sooner.  Performed at [facility], [place] [address], [place], [place] [address]   Urine culture     Status: Abnormal   Collection Time: 07/03/2020  2:20 PM   Specimen: In/Out Cath Urine  Result Value Ref Range Status   Specimen Description IN/OUT CATH URINE  Final   Special Requests   Final    NONE Performed at [facility], [place] [address], [place],  [address]    Culture (A)  Final    >=100,000 COLONIES/mL KLEBSIELLA PNEUMONIAE Confirmed Extended Spectrum Beta-Lactamase Producer (ESBL).  In bloodstream infections from ESBL organisms, carbapenems are preferred over piperacillin/tazobactam. They are shown to have a lower risk of mortality.    Report Status 06/25/2020 FINAL  Final   Organism ID, Bacteria KLEBSIELLA PNEUMONIAE (A)  Final      Susceptibility   Klebsiella pneumoniae - MIC*    AMPICILLIN >=32 RESISTANT Resistant     CEFAZOLIN >=64 RESISTANT Resistant     CEFTRIAXONE >=64 RESISTANT Resistant     CIPROFLOXACIN >=4 RESISTANT Resistant     GENTAMICIN >=16 RESISTANT Resistant     IMIPENEM 0.5 SENSITIVE Sensitive     NITROFURANTOIN <=16 SENSITIVE Sensitive     TRIMETH/SULFA >=320 RESISTANT Resistant     AMPICILLIN/SULBACTAM >=32 RESISTANT Resistant     PIP/TAZO 16 SENSITIVE Sensitive     * >=100,000 COLONIES/mL KLEBSIELLA PNEUMONIAE  Blood Culture (routine x 2)     Status: None   Collection Time: 07/13/2020  4:23 PM   Specimen: BLOOD  Result Value Ref Range Status   Specimen Description BLOOD BLOOD RIGHT FOREARM  Final   Special Requests   Final    BOTTLES DRAWN AEROBIC AND ANAEROBIC Blood Culture results may not be optimal due to an inadequate  volume of blood received in culture bottles   Culture   Final    NO GROWTH 5 DAYS Performed at [facility], [place] [address], [place], [place] [address]    Report Status 06/28/2020 FINAL  Final      Radiology Studies: No results  found.    Scheduled Meds: . aspirin  81 mg Oral Daily  . calcium acetate  667 mg Oral TID WC  . Chlorhexidine Gluconate Cloth  6 each Topical Daily  . fluticasone  2 spray Each Nare Daily  . heparin injection (subcutaneous)  5,000 Units Subcutaneous Q8H  . insulin aspart  0-5 Units Subcutaneous QHS  . insulin aspart  0-9 Units Subcutaneous TID WC  . mouth rinse  15 mL Mouth Rinse BID  . metoprolol succinate  50 mg Oral Daily  . mometasone-formoterol  2 puff Inhalation BID  . montelukast  10 mg Oral QHS  . pantoprazole  40 mg Oral Daily  . pentoxifylline  400 mg Oral TID WC   Continuous Infusions: . dextrose    . meropenem (MERREM) IV 500 mg (07-09-20 1039)     LOS: 7 days      Time spent: 35 minutes   Dessa Phi, DO [facility] 07-09-2020, 12:39 PM   Available via Epic secure chat 7am-7pm After these hours, please refer to coverage provider listed on [email or web address]

## 2020-07-21 NOTE — Death Summary Note (Signed)
DEATH SUMMARY   Patient Details  Name: Ann Sellers MRN: [id number] DOB: [birth year]  Admission/Discharge Information   Admit Date:  21-Jul-2020  Date of Death: Date of Death: 07-28-20  Time of Death: Time of Death: 02-08-11  Length of Stay: 24-Jan-2023  Referring Physician: Reynold Bowen, MD   Reason(s) for Hospitalization  Acute on chronic hypoxemic and hypercarbic respiratory failure  Diagnoses  Preliminary cause of death:  Acute on chronic hypoxic and hypercarbic resp failure  Secondary Diagnoses (including complications and co-morbidities):  Active Problems:   Diabetes mellitus (HCC)   Chronic kidney disease (CKD), stage IV (severe) (HCC)   Chronic diastolic heart failure (HCC)   Aspiration pneumonia (HCC)   Acute encephalopathy   AKI (acute kidney injury) ([place])   Palliative care by specialist   Goals of care, counseling/discussion   DNR (do not resuscitate)   Terminal care   Infection with ESBL Klebsiella oxytoca   Hypernatremia   Iron deficiency anemia   COPD (chronic obstructive pulmonary disease) ([place])   Brief Hospital Course (including significant findings, care, treatment, and services provided and events leading to death)  Ann Sellers is a 86 yo female with past medical history of COPD, chronic respiratory failure on 2 L home O2, chronic diastolic CHF, type 2 diabetes mellitus, essential hypertension, moderate aortic stenosis, chronic kidney disease stage 4 who was brought to the emergency room with sudden onset confusionand increase oxygen requirement.Woke up9/27morning and was noted to be confused. Her oxygen level was 83% on 2 L.EMS was called and patient was brought into the ED for further evaluation andmanagement. She has had occasional choking episodes in the past when she eats meats. In the ED, patient was somnolent but easily arousable to voicesand confused.InitialABG showed no evidence of hypercarbia with pH of 7.4.Chest x-rayand CT chestcompleted with  bilateral pulmonary infiltrates R>[place] notedbilateralpleural effusion, also noted to have acute kidney injury with creatinine of 4.4 and severe hyperkalemia,Covid PCR was negative.   She was started on antibiotics for aspiration pneumonia as well as IVfluids for renal failure. Hospitalization complicated by intermittent encephalopathy from hypercarbia due to COPD, undiagnosed OSA and pneumonia. Antibiotics changed to meropenem 9/6 to cover ESBL Klebsiella in urine. She continued to have very slow progress; continued to show lethargy requiring BiPAP intermittently. Hypernatremia was treated with IVF with little improvement. Due to lack of improvement in respiratory status and mentation, palliative care was consulted. Family decided to focus on comfort and patient deceased 07-29-2023.   Pertinent Labs and Studies  Significant Diagnostic Studies DG Abd 1 View  Result Date: 06/25/2020 CLINICAL DATA:  Abdominal pain EXAM: ABDOMEN - 1 VIEW COMPARISON:  Partial comparison to chest radiograph dated 07-21-20 FINDINGS: Nonobstructive bowel gas pattern. Vascular calcifications. Left pleural effusion. Degenerative changes of the lumbar spine. IMPRESSION: Nonobstructive bowel gas pattern. Left pleural effusion. Electronically Signed   By: Julian Hy M.D.   On: 06/25/2020 06:20   CT Head Wo Contrast  Result Date: 07/21/2020 CLINICAL DATA:  Delirium EXAM: CT HEAD WITHOUT CONTRAST TECHNIQUE: Contiguous axial images were obtained from the base of the skull through the vertex without intravenous contrast. COMPARISON:  CT brain 03/30/2020 FINDINGS: Brain: No acute territorial infarction or hemorrhage is visualized. Stable 2.8 x 2.7 cm calcified extra-axial skull base mass consistent with planum sphenoidale meningioma. Mild atrophy. Mild hypodensity in the white matter consistent with chronic small vessel ischemic change. Stable ventricle size. Vascular: No hyperdense vessel. Vertebral and carotid vascular  calcification Skull: Normal. Negative for fracture or  focal lesion. Sinuses/Orbits: No acute finding. Other: None IMPRESSION: 1. No CT evidence for acute intracranial abnormality. 2. Atrophy and mild chronic small vessel ischemic changes of the white matter. 3. 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 Electronically Signed   By: Donavan Foil M.D.   On: 07/07/2020 19:23   CT CHEST WO CONTRAST  Result Date: 07/05/2020 CLINICAL DATA:  86 year old with respiratory failure. EXAM: CT CHEST WITHOUT CONTRAST TECHNIQUE: Multidetector CT imaging of the chest was performed following the standard protocol without IV contrast. COMPARISON:  Radiograph earlier this day. FINDINGS: Cardiovascular: Mild multi chamber cardiomegaly. There are coronary artery calcifications. Mitral annulus calcifications. Atherosclerosis of the thoracic aorta with mild tortuosity. No aortic aneurysm. No pericardial effusion. Mediastinum/Nodes: No enlarged mediastinal lymph nodes. Limited assessment for hilar adenopathy in the absence of IV contrast as well as patient motion. Small hiatal hernia. There is no esophageal wall thickening. Hypodense 18 mm right thyroid nodule, stable from prior exam. In the setting of significant comorbidities or limited life expectancy, no follow-up recommended (ref: J Am Coll Radiol. 2015 Feb;12(2): 143-50). Lungs/Pleura: Multifocal nodular airspace disease throughout both lungs, some of which appears ground-glass. Confluent opacity in the dependent right lower lobe with air bronchograms. There are small bilateral pleural effusions. Associated dependent opacity at the left lung base is likely compressive atelectasis. Occasional smooth scattered septal thickening. No debris in the trachea or bronchial tree. Upper Abdomen: High-density ingested material in the stomach. No other acute finding. Musculoskeletal: Degenerative change in the spine. There are no acute or suspicious osseous abnormalities. IMPRESSION: 1.  Multifocal nodular airspace disease throughout both lungs, some of which appears ground-glass. Confluent opacity in the dependent right lower lobe with air bronchograms. Findings most consistent with multifocal pneumonia. Given the occasional nodular appearance, recommend radiographic follow-up to ensure resolution. 2. Small bilateral pleural effusions. Left basilar opacity is more typical of compressive atelectasis. 3. Mild cardiomegaly. Coronary artery calcifications. 4. Mild smooth septal thickening which can be seen with mild pulmonary edema. Aortic Atherosclerosis (ICD10-I70.0). Electronically Signed   By: Keith Rake M.D.   On: 07/15/2020 19:52   US RENAL  Result Date:  CLINICAL DATA:  Acute kidney injury EXAM: RENAL / URINARY TRACT ULTRASOUND COMPLETE COMPARISON:  12/26/2019 FINDINGS: Right Kidney: Renal measurements: 9.3 x 5.1 x 5.4 cm = volume: 135 mL. Normal echotexture. No hydronephrosis. 4 cm cyst in the lower pole. 1.5 cm hypoechoic area within the upper pole, similar to prior study compatible with complex cyst. Left Kidney: Renal measurements: 9.9 x 5.1 x 5.7 cm = volume: 151 mL. Echogenicity within normal limits. No mass or hydronephrosis visualized. Bladder: Appears normal for degree of bladder distention. Other: None. IMPRESSION: No acute findings.  No hydronephrosis. Right renal cysts are unchanged. Electronically Signed   By: Rolm Baptise M.D.   On: 07/16/2020 20:32   DG Chest Port 1 View  Result Date: 06/26/2020 CLINICAL DATA:  Hypoxia. EXAM: PORTABLE CHEST 1 VIEW COMPARISON:  June 23, 2020. FINDINGS: Stable cardiomediastinal silhouette. No pneumothorax is noted. Stable bilateral lung opacities are noted concerning for multifocal pneumonia. Small bilateral pleural effusions are noted. Bony thorax is unremarkable. IMPRESSION: Stable bilateral lung opacities are noted concerning for multifocal pneumonia. Small bilateral pleural effusions are noted. Aortic Atherosclerosis  (ICD10-I70.0). Electronically Signed   By: Marijo Conception M.D.   On: 06/26/2020 12:01   DG Chest Port 1 View  Result Date: 07/19/2020 CLINICAL DATA:  Sepsis, dyspnea, generalized weakness EXAM: PORTABLE CHEST 1 VIEW COMPARISON:  01/07/2020 FINDINGS:  The lungs are symmetrically well expanded. Bibasilar airspace infiltrates are present, likely infectious or inflammatory in nature. Small left pleural effusion is present. No pneumothorax. Cardiac size is within normal limits, unchanged. Pulmonary vascularity is normal. No acute bone abnormality. IMPRESSION: Bibasilar airspace infiltrates, likely infectious or inflammatory in nature. Small left pleural possible parapneumonic effusion. Electronically Signed   By: Fidela Salisbury MD   On: 07/15/2020 15:57    Microbiology Recent Results (from the past 240 hour(s))  Blood Culture (routine x 2)     Status: None   Collection Time: 07/16/2020  2:00 PM   Specimen: BLOOD  Result Value Ref Range Status   Specimen Description BLOOD LEFT ANTECUBITAL  Final   Special Requests   Final    BOTTLES DRAWN AEROBIC AND ANAEROBIC Blood Culture results may not be optimal due to an inadequate volume of blood received in culture bottles   Culture   Final    NO GROWTH 5 DAYS Performed at [facility], [place] [address], [place], [place] [address]    Report Status 06/28/2020 FINAL  Final  SARS Coronavirus 2 by RT PCR (hospital order, performed in [facility] hospital lab) Nasopharyngeal Nasopharyngeal Swab     Status: None   Collection Time: 07/08/2020  2:11 PM   Specimen: Nasopharyngeal Swab  Result Value Ref Range Status   SARS Coronavirus 2 NEGATIVE NEGATIVE Final    Comment: (NOTE) SARS-CoV-2 target nucleic acids are NOT DETECTED.  The SARS-CoV-2 RNA is generally detectable in upper and lower respiratory specimens during the acute phase of infection. The lowest concentration of SARS-CoV-2 viral copies this assay can detect is 250 copies / mL. A negative result  does not preclude SARS-CoV-2 infection and should not be used as the sole basis for treatment or other patient management decisions.  A negative result may occur with improper specimen collection / handling, submission of specimen other than nasopharyngeal swab, presence of viral mutation(s) within the areas targeted by this assay, and inadequate number of viral copies (<250 copies / mL). A negative result must be combined with clinical observations, patient history, and epidemiological information.  Fact Sheet for Patients:   StrictlyIdeas.no  Fact Sheet for Healthcare Providers: BankingDealers.co.za  This test is not yet approved or  cleared by the [place] FDA and has been authorized for detection and/or diagnosis of SARS-CoV-2 by FDA under an Emergency Use Authorization (EUA).  This EUA will remain in effect (meaning this test can be used) for the duration of the COVID-19 declaration under Section 564(b)(1) of the Act, 21 U.S.C. section 360bbb-3(b)(1), unless the authorization is terminated or revoked sooner.  Performed at [facility], [place] [address], [place], [place] [address]   Urine culture     Status: Abnormal   Collection Time: 07/07/2020  2:20 PM   Specimen: In/Out Cath Urine  Result Value Ref Range Status   Specimen Description IN/OUT CATH URINE  Final   Special Requests   Final    NONE Performed at [facility], [place] [address] [place], [place] [address]    Culture (A)  Final    >=100,000 COLONIES/mL KLEBSIELLA PNEUMONIAE Confirmed Extended Spectrum Beta-Lactamase Producer (ESBL).  In bloodstream infections from ESBL organisms, carbapenems are preferred over piperacillin/tazobactam. They are shown to have a lower risk of mortality.    Report Status 06/25/2020 FINAL  Final   Organism ID, Bacteria KLEBSIELLA PNEUMONIAE (A)  Final      Susceptibility   Klebsiella pneumoniae -  MIC*    AMPICILLIN >=32  RESISTANT Resistant     CEFAZOLIN >=64 RESISTANT Resistant     CEFTRIAXONE >=64 RESISTANT Resistant     CIPROFLOXACIN >=4 RESISTANT Resistant     GENTAMICIN >=16 RESISTANT Resistant     IMIPENEM 0.5 SENSITIVE Sensitive     NITROFURANTOIN <=16 SENSITIVE Sensitive     TRIMETH/SULFA >=320 RESISTANT Resistant     AMPICILLIN/SULBACTAM >=32 RESISTANT Resistant     PIP/TAZO 16 SENSITIVE Sensitive     * >=100,000 COLONIES/mL KLEBSIELLA PNEUMONIAE  Blood Culture (routine x 2)     Status: None   Collection Time: 06/27/2020  4:23 PM   Specimen: BLOOD  Result Value Ref Range Status   Specimen Description BLOOD BLOOD RIGHT FOREARM  Final   Special Requests   Final    BOTTLES DRAWN AEROBIC AND ANAEROBIC Blood Culture results may not be optimal due to an inadequate volume of blood received in culture bottles   Culture   Final    NO GROWTH 5 DAYS Performed at [facility], [place] [address], [place],  [address]    Report Status 06/28/2020 FINAL  Final    Lab Basic Metabolic Panel: Recent Labs  Lab 06/27/20 0440 06/28/20 0620 06/29/20 0809 06/29/20 2240 07-07-20 0434  NA 147* 146* 150* 147* 149*  K 4.7 4.3 4.6 4.7 5.0  CL 111 113* 115* 113* 114*  CO2 22 23 25 24 24   GLUCOSE 110* 151* 138* 176* 142*  BUN 80* 72* 66* 66* 65*  CREATININE 3.15* 2.63* 2.36* 2.27* 2.32*  CALCIUM 9.9 9.9 9.7 9.5 9.3  MG  --   --   --  1.8  --    Liver Function Tests: No results for input(s): AST, ALT, ALKPHOS, BILITOT, PROT, ALBUMIN in the last 168 hours. No results for input(s): LIPASE, AMYLASE in the last 168 hours. Recent Labs  Lab 06/27/20 1553  AMMONIA 31   CBC: Recent Labs  Lab 06/24/20 1259 06/25/20 0507 06/26/20 0333 06/27/20 0440 06/28/20 0620  WBC 6.3 8.6 7.1 5.9 6.0  NEUTROABS  --  7.6 6.2  --   --   HGB 6.9* 9.2* 10.3* 9.6* 10.6*  HCT 22.9* 30.0* 33.5* 31.0* 35.2*  MCV 100.9* 98.0 100.6* 99.7 101.7*  PLT 113* 135* 143* 165 221   Cardiac Enzymes: Recent Labs  Lab  06/24/20 1259  CKTOTAL 43   Sepsis Labs: Recent Labs  Lab 06/25/20 0507 06/25/20 0801 06/26/20 0333 06/27/20 0440 06/28/20 0620  WBC 8.6  --  7.1 5.9 6.0  LATICACIDVEN 1.1 0.8  --   --   --      Dessa Phi 07/15/2020, 7:01 AM

## 2020-07-21 NOTE — Progress Notes (Signed)
RT Note RT was called by the RN due to patients sats were dropping. Pt is wearing NRB and sats are 100%. V/S stable at this moment. RT will continue to monitor.

## 2020-07-21 NOTE — Progress Notes (Signed)
Pt had cardiac arrest at 0912 PM. Have notified her son Knightly,Jimmy and Dr. Inda Merlin at [place] PM.  Pt was under comfort care, so no resuscitation attempts were performed. Postmortem care performed.

## 2020-07-21 NOTE — Consult Note (Signed)
Palliative Medicine Inpatient Consult Note  Reason for consult:  Goals of Care  HPI:  Per intake H&P --> Ann Sellers is a 86 y.o. female with medical history significant for essential HTN, type 2 diabetes, chronic diastolic CHF, asthma, chronic hypoxia on 2L continuously who presented to [facility] ED from home due to sudden onset confusion and increase oxygen requirement.  Palliative care was asked to get involved to aid in goals of care conversations. Ann Sellers had an acute decline overnight whereby she was transitioned to 15LPM NRB.  Clinical Assessment/Goals of Care: I have reviewed medical records including EPIC notes, labs and imaging, received report from bedside RN, assessed the patient.    I called Ann Sellers and Ann Sellers  to further discuss diagnosis prognosis, GOC, EOL wishes, disposition and options.   I introduced Palliative Medicine as specialized medical care for people living with serious illness. It focuses on providing relief from the symptoms and stress of a serious illness. The goal is to improve quality of life for both the patient and the family.  I asked Ann Sellers and Ann Sellers to tell me about their mother. They share that she is from [place], [place]. She is a widow - she was married for 71 three years. She has six children. 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 She loved raised gardens and plants. She is a woman of faith and practices within the [place] denomination.  She lived in a home with her son Ann Sellers prior to hospitalization. Per discussions with Ann Sellers she "bounced back" from her last hospitalization and started to participate in her bADLs. She was doing well and fully coherent until this last week. Prior to this she had been able to cook dinner for her family.  Ann Sellers was admitted in April and transitioned to rehabilitation after being treated for aspiration  PNA. She was resistant towards bipap thereafter as her children shared at the SNF they were aggressive about her bipap. She suffered a degree of devastation thereafter and had not wanted to wear it any longer. She has also had worsening vision over the past eight months and has only been able to see shadows which has been very difficult.   A detailed discussion was had today regarding advanced directives, there are none on file though her children make decisions together.    Concepts regarding her present decline and prognosis were presented. We discussed that she has fluctuated in health though over the past three days has been declining steadily.   The difference between a aggressive medical intervention path  and a palliative comfort care path for this patient at this time was had. We talked about transition to comfort measures in house and what that would entail inclusive of medications to control pain, dyspnea, agitation, nausea, itching, and hiccups.  We discussed stopping all uneccessary measures such as blood draws, needle sticks, and frequent vital signs. Utilized reflective listening throughout our time together.   Discussed the importance of continued conversation with family and their  medical providers regarding overall plan of care and treatment options, ensuring decisions are within the context of the patients values and GOCs. _____________________________________________ Addendum:  I spoke to [name] in late afternoon via telephone. They had spoke with their siblings and made the decision to transition Ann Sellers to comfort care. I shared that we would transition her to a low dose opoid  drip to aid in symptom management prior to bipap being removed. We reviewed the visitation policy.  Decision Maker: Patients children make decisions jointly  SUMMARY OF RECOMMENDATIONS   DNAR/DNI  Transition to comfort oriented care  Spiritual Support  Symptom management per [facility]  Code  Status/Advance Care Planning: DNAR/DNI   Palliative Prophylaxis:   Oral Care, Aspiration, Delirium  Additional Recommendations (Limitations, Scope, Preferences):  Treat what is treatable   Psycho-social/Spiritual:   Desire for further Chaplaincy support: Yes - [facility]  Additional Recommendations: Education on comfort oriented care   Prognosis: Exceptionally poor  Discharge Planning: Discharge will be celestial.  PPS: 10%   This conversation/these recommendations were discussed with patient primary care team, Dr. Maylene Roes  Time In: 1140 Time Out: 1310 Total Time: 90 Greater than 50%  of this time was spent counseling and coordinating care related to the above assessment and plan.  [place] Team Team Cell Phone: [phone] Please utilize secure chat with additional questions, if there is no response within 30 minutes please call the above phone number  Palliative Medicine Team providers are available by phone from 7am to 7pm daily and can be reached through the team cell phone.  Should this patient require assistance outside of these hours, please call the patient's attending physician.

## 2020-07-21 NOTE — Consult Note (Signed)
   [facility] CM Inpatient Consult   07/17/2020  Ann Sellers 1925-02-28 [id number]   [place] Organization [ACO] Patient: [facility]   Patient screened for high risk score for unplanned readmission score and for hospitalizations to check if potential [facility] service needs.  Review of patient's medical record reveals patient is currently being recommended for a skilled nursing facility transition. However,the patient and family has been consulting with palliative care for goals and transition. Primary Care Provider is Reynold Bowen, [facility]  this provider is listed to provide the transition of care [TOC] for post hospital follow up.  Plan:  Follow for disposition/transition.  For questions contact:   Natividad Brood, RN BSN [facility] Liaison  [phone] business mobile phone Toll free office [phone]  Fax number: [phone] [email or web address]

## 2020-07-21 NOTE — Progress Notes (Signed)
HOSPITAL MEDICINE OVERNIGHT EVENT NOTE    Notified by nursing that patient has expired. Patient was a DNR and therefore ACLS was not initiated.  Patient has already been pronounced by nursing staff, Time of death 9:12PM.  Family has been notified by staff.    Cause of death Acute on chronic hypoxic and hypercapnic respiratory failure secondary to Aspiration Pneumonia.  Vernelle Emerald  MD [facility]

## 2020-07-21 DEATH — deceased

## 2020-10-04 ENCOUNTER — Ambulatory Visit: Payer: Medicare Other | Admitting: Cardiology
